# Patient Record
Sex: Male | Born: 1935
Health system: Southern US, Community
[De-identification: ages and names within clinical notes are randomized; demographics above are authoritative.]

## PROBLEM LIST (undated history)

## (undated) ENCOUNTER — Emergency Department (HOSPITAL_COMMUNITY): Admission: EM | Discharge status: Absent without leave | Payer: Medicare Other

## (undated) DIAGNOSIS — G629 Polyneuropathy, unspecified: Secondary | ICD-10-CM

## (undated) DIAGNOSIS — Z9889 Other specified postprocedural states: Secondary | ICD-10-CM

## (undated) DIAGNOSIS — R413 Other amnesia: Secondary | ICD-10-CM

## (undated) DIAGNOSIS — H543 Unqualified visual loss, both eyes: Secondary | ICD-10-CM

## (undated) DIAGNOSIS — F0390 Unspecified dementia without behavioral disturbance: Secondary | ICD-10-CM

## (undated) DIAGNOSIS — C61 Malignant neoplasm of prostate: Secondary | ICD-10-CM

## (undated) DIAGNOSIS — G4700 Insomnia, unspecified: Secondary | ICD-10-CM

## (undated) DIAGNOSIS — R51 Headache: Secondary | ICD-10-CM

## (undated) DIAGNOSIS — J45909 Unspecified asthma, uncomplicated: Secondary | ICD-10-CM

## (undated) DIAGNOSIS — I10 Essential (primary) hypertension: Secondary | ICD-10-CM

## (undated) DIAGNOSIS — M1990 Unspecified osteoarthritis, unspecified site: Secondary | ICD-10-CM

## (undated) DIAGNOSIS — E7800 Pure hypercholesterolemia, unspecified: Secondary | ICD-10-CM

## (undated) DIAGNOSIS — N200 Calculus of kidney: Secondary | ICD-10-CM

## (undated) DIAGNOSIS — R112 Nausea with vomiting, unspecified: Secondary | ICD-10-CM

## (undated) HISTORY — PX: KNEE ARTHROSCOPY: SHX127

## (undated) HISTORY — DX: Malignant neoplasm of prostate: C61

## (undated) HISTORY — PX: HEMORROIDECTOMY: SUR656

## (undated) HISTORY — PX: APPENDECTOMY: SHX54

## (undated) HISTORY — DX: Insomnia, unspecified: G47.00

## (undated) HISTORY — DX: Other amnesia: R41.3

## (undated) HISTORY — PX: TONSILLECTOMY: SUR1361

---

## 1999-06-12 ENCOUNTER — Ambulatory Visit (HOSPITAL_COMMUNITY): Admission: RE | Admit: 1999-06-12 | Discharge: 1999-06-12 | Payer: Self-pay | Admitting: Cardiology

## 1999-06-29 ENCOUNTER — Ambulatory Visit (HOSPITAL_COMMUNITY): Admission: RE | Admit: 1999-06-29 | Discharge: 1999-06-29 | Payer: Self-pay | Admitting: *Deleted

## 1999-07-04 ENCOUNTER — Ambulatory Visit (HOSPITAL_COMMUNITY): Admission: RE | Admit: 1999-07-04 | Discharge: 1999-07-04 | Payer: Self-pay | Admitting: *Deleted

## 2002-08-09 ENCOUNTER — Emergency Department (HOSPITAL_COMMUNITY): Admission: EM | Admit: 2002-08-09 | Discharge: 2002-08-10 | Payer: Self-pay | Admitting: Emergency Medicine

## 2002-08-10 ENCOUNTER — Ambulatory Visit (HOSPITAL_COMMUNITY): Admission: RE | Admit: 2002-08-10 | Discharge: 2002-08-10 | Payer: Self-pay | Admitting: *Deleted

## 2004-06-28 ENCOUNTER — Encounter: Admission: RE | Admit: 2004-06-28 | Discharge: 2004-06-28 | Payer: Self-pay | Admitting: Occupational Medicine

## 2004-08-16 ENCOUNTER — Encounter: Admission: RE | Admit: 2004-08-16 | Discharge: 2004-08-16 | Payer: Self-pay | Admitting: Internal Medicine

## 2006-02-21 ENCOUNTER — Encounter: Payer: Self-pay | Admitting: Vascular Surgery

## 2006-02-21 ENCOUNTER — Ambulatory Visit: Admission: RE | Admit: 2006-02-21 | Discharge: 2006-02-21 | Payer: Self-pay | Admitting: Internal Medicine

## 2006-06-06 ENCOUNTER — Ambulatory Visit (HOSPITAL_COMMUNITY): Admission: RE | Admit: 2006-06-06 | Discharge: 2006-06-06 | Payer: Self-pay | Admitting: Ophthalmology

## 2006-10-28 ENCOUNTER — Encounter: Admission: RE | Admit: 2006-10-28 | Discharge: 2006-10-28 | Payer: Self-pay | Admitting: Internal Medicine

## 2006-11-04 ENCOUNTER — Encounter: Admission: RE | Admit: 2006-11-04 | Discharge: 2006-11-04 | Payer: Self-pay | Admitting: Internal Medicine

## 2006-11-18 ENCOUNTER — Encounter: Admission: RE | Admit: 2006-11-18 | Discharge: 2006-11-18 | Payer: Self-pay | Admitting: *Deleted

## 2006-11-18 ENCOUNTER — Encounter (INDEPENDENT_AMBULATORY_CARE_PROVIDER_SITE_OTHER): Payer: Self-pay | Admitting: Specialist

## 2006-11-18 ENCOUNTER — Other Ambulatory Visit: Admission: RE | Admit: 2006-11-18 | Discharge: 2006-11-18 | Payer: Self-pay | Admitting: Interventional Radiology

## 2006-11-28 ENCOUNTER — Ambulatory Visit (HOSPITAL_COMMUNITY): Admission: RE | Admit: 2006-11-28 | Discharge: 2006-11-28 | Payer: Self-pay | Admitting: Ophthalmology

## 2007-04-29 ENCOUNTER — Encounter: Admission: RE | Admit: 2007-04-29 | Discharge: 2007-04-29 | Payer: Self-pay | Admitting: Internal Medicine

## 2008-03-20 ENCOUNTER — Emergency Department (HOSPITAL_COMMUNITY): Admission: EM | Admit: 2008-03-20 | Discharge: 2008-03-21 | Payer: Self-pay | Admitting: Emergency Medicine

## 2008-04-06 ENCOUNTER — Encounter: Admission: RE | Admit: 2008-04-06 | Discharge: 2008-04-06 | Payer: Self-pay | Admitting: Internal Medicine

## 2008-05-21 ENCOUNTER — Encounter: Admission: RE | Admit: 2008-05-21 | Discharge: 2008-05-21 | Payer: Self-pay | Admitting: Internal Medicine

## 2008-06-03 ENCOUNTER — Encounter: Admission: RE | Admit: 2008-06-03 | Discharge: 2008-06-03 | Payer: Self-pay | Admitting: Internal Medicine

## 2010-02-10 ENCOUNTER — Ambulatory Visit (HOSPITAL_COMMUNITY): Admission: RE | Admit: 2010-02-10 | Discharge: 2010-02-10 | Payer: Self-pay | Admitting: Urology

## 2010-02-22 ENCOUNTER — Ambulatory Visit: Admission: RE | Admit: 2010-02-22 | Discharge: 2010-04-13 | Payer: Self-pay | Admitting: Radiation Oncology

## 2010-05-03 ENCOUNTER — Ambulatory Visit: Admission: RE | Admit: 2010-05-03 | Discharge: 2010-08-01 | Payer: Self-pay | Admitting: Radiation Oncology

## 2010-08-01 ENCOUNTER — Ambulatory Visit: Admission: RE | Admit: 2010-08-01 | Discharge: 2010-08-31 | Payer: Self-pay | Admitting: Radiation Oncology

## 2011-03-01 ENCOUNTER — Ambulatory Visit (HOSPITAL_COMMUNITY)
Admission: RE | Admit: 2011-03-01 | Discharge: 2011-03-01 | Disposition: A | Discharge status: Home / Self Care | Payer: Medicare Other | Source: Ambulatory Visit | Attending: Ophthalmology | Admitting: Ophthalmology

## 2011-03-01 DIAGNOSIS — H2640 Unspecified secondary cataract: Secondary | ICD-10-CM | POA: Insufficient documentation

## 2011-03-02 NOTE — Op Note (Signed)
NAME:  Marvin Bishop, SZATKOWSKI NO.:  [id number]   MEDICAL RECORD NO.:  [id number]          PATIENT TYPE:  AMB   LOCATION:  SDS                          FACILITY:  [facility]   PHYSICIAN:  Salley Scarlet., M.D.DATE OF BIRTH:  12/13/1919   DATE OF PROCEDURE:  06/06/2006  DATE OF DISCHARGE:  06/06/2006                                 OPERATIVE REPORT   PREOPERATIVE DIAGNOSIS:  Immature cataract, left eye.   POSTOPERATIVE DIAGNOSIS:  Immature cataract, left eye.   OPERATION:  Kelman phacoemulsification of cataract, left eye.   ANESTHESIA:  Local using Xylocaine 2% with Marcaine 0.75% and Vitrase.   JUSTIFICATION FOR PROCEDURE:  This is an 77 year old gentleman who complains  of blurring of vision with difficulty seeing to read.  He was evaluated and  found to have bilateral immature cataracts, slightly worse on the left than  the right.  Cataract extraction with intraocular lens implantation was  recommended.  He is admitted at this time for that purpose.   PROCEDURE:  Under the influence of IV sedation, the Van Lint akinesia and  retrobulbar anesthesia were given.  The patient was prepped and draped in  the usual manner.  The lid speculum was inserted under the upper and lower  lids of the left eye and a 4-0 silk traction suture was passed through the  belly of the superior rectus muscle for retraction.  A fornix-based  conjunctival flap was turned and hemostasis was achieved by using cautery.  An incision was made in the sclera at the limbus.  This incision was  dissected down into clear cornea using a crescent blade.  A sideport  incision was made at the 1:30 o'clock position.  OcuCoat was injected into  the eye through the sideport incision.  The anterior chamber was then  entered through the corneoscleral groove incision at the 11:30 o'clock  position.  An anterior capsulotomy was done using a bent 25-gauge needle.  The nucleus was hydrodissected using Xylocaine.   The KPE handpiece was  passed into the eye and the nucleus was emulsified without difficulty.  The  residual cortical material was aspirated.  The posterior capsule was  polished using an olive-tip polisher.  The wound was widened slightly to  accommodate a foldable silicone lens.  The lens was seated into the eye  behind the iris without difficulty.  The anterior chamber was reformed and  the pupil was constricted using Miochol.  The residual OcuCoat was aspirated  from the eye.  The lips of the wound were hydrated and tested to make sure  that there was no leak.  After ascertaining that there was no leak, the  conjunctiva was closed over the wound using thermal cautery.  One milliliter  of Celestone and 0.5 mL of gentamicin were injected subconjunctivally.  Maxitrol ophthalmic ointment and Pilopine ointment were applied along with a  patch and Fox shield.  The patient tolerated the procedure well and was  discharged to the postanesthesia recovery room in satisfactory condition.   He is instructed to rest today, to take Vicodin every 4 hours as  needed for  pain and to see me in the office tomorrow for further evaluation.   DISCHARGE DIAGNOSIS:  Immature cataract of left eye.      Salley Scarlet., M.D.  Electronically Signed     TB/MEDQ  D:  06/06/2006  T:  06/07/2006  Job:  [id number]

## 2011-03-02 NOTE — Op Note (Signed)
NAME:  Marvin Bishop, Marvin Bishop NO.:  [id number]   MEDICAL RECORD NO.:  [id number]          PATIENT TYPE:  AMB   LOCATION:  SDS                          FACILITY:  [facility]   PHYSICIAN:  Salley Scarlet., M.D.DATE OF BIRTH:  January 25, 1920   DATE OF PROCEDURE:  11/30/2006  DATE OF DISCHARGE:  11/28/2006                               OPERATIVE REPORT   SURGEON:  Nadyne Coombes, M.D.   PREOPERATIVE DIAGNOSIS:  Immature cataract, right eye.   POSTOPERATIVE DIAGNOSIS:  Immature cataract, right eye.   OPERATION:  Kelman phakoemulsification of cataract, right eye, with  intraocular lens implantation.   ANESTHESIA:  Local using Xylocaine 2% with Marcaine 0.75% and Wydase.   JUSTIFICATION FOR PROCEDURE:  This is an 77 year old gentleman, who  underwent a cataract extraction from the left eye several months ago.  He complains of blurring of vision with difficulty seeing to read.  He  was evaluated and found to have a visual acuity best corrected to 20/60  on the right and 20/30 on the left.  There is a intraocular lens present  on the left, but there is a 2+ nuclear sclerotic cataract on the right.  Cataract extraction with intraocular lens implantation was recommended.  He was admitted at this time for that purpose.   PROCEDURE:  Under the influence of IV sedation, Van Lint akinesia and  retrobulbar anesthesia were given.  The patient was prepped and draped  in the usual manner. The lid speculum was inserted under the upper and  lower lids of the right eye, and a 4-0 silk traction suture was passed  through the belly of the superior rectus muscle for traction.  A fornix-  based conjunctival flap was turned, and hemostasis was achieved using  cautery.  An incision was made in the sclera at the limbus.  This  incision was dissected down into clear cornea using a crescent blade.  A  side-port incision was made at the 1:30-o'clock position.  Ocucoat was  injected into the eye  through the side-port incision.  The anterior  chamber was then entered through the corneoscleral tunnel incision at  the 11:30-o'clock position using a 2.75-mm keratome.  An anterior  capsulotomy was done using a bent 25-gauge needle.  The nucleus was  hydrodissected using Xylocaine.  The KPE handpiece was passed into the  eye and the nucleus was emulsified with a moderate amount of difficulty.  The residual cortical material was aspirated from the eye.  The wound  was widened slightly to accommodate a foldable Silicone lens.  This lens  was seated into the eye behind the iris, but as it was inserted, it was  noticed to begin to sink into the posterior chamber.  The corneoscleral  wound was then opened, and the IOL was grasped securely with the lens  forceps and gently removed from the eye.  An anterior vitrectomy was  done using the vitrector.  An anterior chamber lens was then seated  across the iris into the chamber angle, and rotated in a horizontal  fashion.  A peripheral iridectomy was made in the  iris.  The  corneoscleral wound was closed by using a combination of interrupted  sutures of 8-0 Vicryl and 10 nylon.  After it was ascertained that the  wound was airtight and watertight, the conjunctiva was closed over the  wound using thermal cautery.  Celestone 1 cc and 0.5 cc of gentamicin  were injected subconjunctivally.  Maxitrol ophthalmic ointment and  atropine drops were applied, along with a patch and Fox shield.  The  patient tolerated the procedure well and was discharged to the  postanesthesia recovery room in satisfactory condition.  He was  instructed to rest today, to take Vicodin every 4 hours as needed for  pain, and to see me in the office tomorrow for further evaluation.   DISCHARGE DIAGNOSIS:  Immature cataract, right eye.      Salley Scarlet., M.D.  Electronically Signed     TB/MEDQ  D:  11/30/2006  T:  11/30/2006  Job:  [id number]

## 2011-07-12 LAB — URINALYSIS, ROUTINE W REFLEX MICROSCOPIC
Bilirubin Urine: NEGATIVE
Glucose, UA: NEGATIVE
Hgb urine dipstick: NEGATIVE
Ketones, ur: NEGATIVE
Nitrite: NEGATIVE
Protein, ur: NEGATIVE
Specific Gravity, Urine: 1.018
Urobilinogen, UA: 1
pH: 7.5

## 2011-07-12 LAB — POCT I-STAT, CHEM 8
BUN: 15
Calcium, Ion: 1.3
Chloride: 104
Creatinine, Ser: 1
Glucose, Bld: 102 — ABNORMAL HIGH
HCT: 29 — ABNORMAL LOW
Hemoglobin: 9.9 — ABNORMAL LOW
Potassium: 4.6
Sodium: 138
TCO2: 25

## 2011-07-12 LAB — CBC
HCT: 28.6 — ABNORMAL LOW
Hemoglobin: 9.7 — ABNORMAL LOW
MCHC: 33.8
MCV: 82.7
Platelets: 262
RBC: 3.46 — ABNORMAL LOW
RDW: 16.3 — ABNORMAL HIGH
WBC: 6.7

## 2011-07-12 LAB — DIFFERENTIAL
Basophils Absolute: 0
Basophils Relative: 0
Eosinophils Absolute: 0.1
Eosinophils Relative: 1
Lymphocytes Relative: 15
Lymphs Abs: 1
Monocytes Absolute: 0.5
Monocytes Relative: 8
Neutro Abs: 5.1
Neutrophils Relative %: 76

## 2011-07-12 LAB — POCT CARDIAC MARKERS
CKMB, poc: 1.9
Myoglobin, poc: 74.2
Operator id: 264421
Troponin i, poc: <0.05

## 2011-09-16 ENCOUNTER — Emergency Department (HOSPITAL_COMMUNITY)
Admission: EM | Admit: 2011-09-16 | Discharge: 2011-09-16 | Discharge status: Against Medical Advice | Payer: Medicare Other | Attending: Emergency Medicine | Admitting: Emergency Medicine

## 2011-09-16 DIAGNOSIS — M549 Dorsalgia, unspecified: Secondary | ICD-10-CM | POA: Insufficient documentation

## 2011-09-16 HISTORY — DX: Essential (primary) hypertension: I10

## 2011-09-16 HISTORY — DX: Pure hypercholesterolemia, unspecified: E78.00

## 2011-09-16 HISTORY — DX: Unspecified osteoarthritis, unspecified site: M19.90

## 2011-09-16 NOTE — ED Notes (Signed)
Pt in lower back pain denies recent injury states hx of arthritis states pain in the left hip states took pain medication this am no relief as of yet

## 2011-09-19 ENCOUNTER — Emergency Department (HOSPITAL_COMMUNITY)
Admission: EM | Admit: 2011-09-19 | Discharge: 2011-09-19 | Discharge status: Against Medical Advice | Payer: Medicare Other | Attending: Emergency Medicine | Admitting: Emergency Medicine

## 2011-09-19 ENCOUNTER — Other Ambulatory Visit: Payer: Self-pay | Admitting: Internal Medicine

## 2011-09-19 DIAGNOSIS — M549 Dorsalgia, unspecified: Secondary | ICD-10-CM | POA: Insufficient documentation

## 2011-09-19 DIAGNOSIS — M5410 Radiculopathy, site unspecified: Secondary | ICD-10-CM

## 2011-09-19 DIAGNOSIS — M545 Low back pain: Secondary | ICD-10-CM

## 2011-09-19 DIAGNOSIS — M5380 Other specified dorsopathies, site unspecified: Secondary | ICD-10-CM | POA: Insufficient documentation

## 2011-09-19 NOTE — ED Notes (Signed)
[facility]:[id number]<GE> Expected date:09/19/11<BR> Expected time: 5:26 PM<BR> Means of arrival:Ambulance<BR> Comments:<BR> EMS 31 GC - back spasms

## 2011-09-19 NOTE — ED Notes (Signed)
Pt has had back pain since October and was supposed to have mri on saturday

## 2011-09-22 ENCOUNTER — Ambulatory Visit
Admission: RE | Admit: 2011-09-22 | Discharge: 2011-09-22 | Disposition: A | Discharge status: Home / Self Care | Payer: Medicare Other | Source: Ambulatory Visit | Attending: Internal Medicine | Admitting: Internal Medicine

## 2011-09-22 DIAGNOSIS — M545 Low back pain: Secondary | ICD-10-CM

## 2011-09-22 DIAGNOSIS — M5410 Radiculopathy, site unspecified: Secondary | ICD-10-CM

## 2011-10-23 ENCOUNTER — Encounter (HOSPITAL_COMMUNITY): Payer: Self-pay | Admitting: Pharmacy Technician

## 2011-10-23 ENCOUNTER — Encounter (HOSPITAL_COMMUNITY): Payer: Self-pay | Admitting: *Deleted

## 2011-10-23 NOTE — H&P (Signed)
Pre-operative History and Physical for Ophthalmic Surgery  Marvin Bishop 10/23/2011                  Chief Complaint: decreased vision right eye  Diagnosis: Rhegmatogenous Retinal Detachment Right Eye  No Known Allergies   Prior to Admission medications   Medication Sig Start Date End Date Taking? Authorizing Provider  cyclobenzaprine (FLEXERIL) 10 MG tablet Take 10 mg by mouth 3 (three) times daily as needed.     Historical Provider, MD  dorzolamide (TRUSOPT) 2 % ophthalmic solution Place 1 drop into the right eye 3 (three) times daily.      Historical Provider, MD  glipiZIDE (GLUCOTROL) 10 MG tablet Take 10 mg by mouth 2 (two) times daily before a meal.      Historical Provider, MD  HYDROcodone-acetaminophen (VICODIN) 5-500 MG per tablet Take 1 tablet by mouth every 8 (eight) hours as needed. AS NEEDED FOR PAIN    Historical Provider, MD  lisinopril (PRINIVIL,ZESTRIL) 2.5 MG tablet Take 2.5 mg by mouth daily.      Historical Provider, MD  LORazepam (ATIVAN) 0.5 MG tablet Take 0.25 mg by mouth every 8 (eight) hours. TAKE 1/2 A TABLET BY MOUTH TWICE DAILY    Historical Provider, MD  meloxicam (MOBIC) 15 MG tablet Take 15 mg by mouth daily.     Historical Provider, MD  metFORMIN (GLUCOPHAGE) 500 MG tablet Take 500 mg by mouth 2 (two) times daily with a meal.      Historical Provider, MD  pioglitazone (ACTOS) 45 MG tablet Take 45 mg by mouth daily.      Historical Provider, MD  prednisoLONE acetate (PRED FORTE) 1 % ophthalmic suspension Place 1 drop into the right eye 4 (four) times daily.      Historical Provider, MD  simvastatin (ZOCOR) 40 MG tablet Take 40 mg by mouth daily.      Historical Provider, MD  Tamsulosin HCl (FLOMAX) 0.4 MG CAPS Take by mouth daily. TAKE 30 MINS. DAILY AFTER SAME MEAL     Historical Provider, MD    Planned Procedure: Pars Plana Vitrectomy, Membrane Peeling, Endolaser, Fluid Gas Exchange and Scleral Buckle          There were no vitals filed for this  visit.  Pulse: 70          Resp: 18        Past Medical History  Diagnosis Date  . Arthritis   . Diabetes mellitus   . Hypertension   . Hypercholesteremia     Past Surgical History  Procedure Date  . Tonsillectomy   . Joint replacement   . Appendectomy      History   Social History  . Marital Status: Widowed    Spouse Name: N/A    Number of Children: N/A  . Years of Education: N/A   Occupational History  . Not on file.   Social History Main Topics  . Smoking status: Former Games developer  . Smokeless tobacco: Not on file  . Alcohol Use: Yes     occasional  . Drug Use: No  . Sexually Active:    Other Topics Concern  . Not on file   Social History Narrative  . No narrative on file     The following examination is for anesthesia clearance for minimally invasive Ophthalmic surgery. It is primarily to document heart and lung findings and is not intended to elucidate unknown general medical conditions inclusive of abdominal masses, lung lesions, etc.  General Constitution:  within normal limits    Alertness/Orientation:  Person, time place     yes   HEENT:  Eye Findings: retinal detachment, vitreous cells, macula off                   right eye  Neck: supple without masses  Chest/Lungs: clear to auscultation  Cardiac: Normal S1 and S2 without Murmur, S3 or S4  Neuro: non-focal  Impression: Rhegmatogenous Retinal Detachment   Planned Procedure:   Pars Plana Vitrectomy, Fluid Gas Exchange, Endolaser, possible scleral buckle    Shade Flood, MD Vitreoretinal Surgery/Medical Retina

## 2011-10-24 ENCOUNTER — Encounter (HOSPITAL_COMMUNITY): Payer: Self-pay | Admitting: Anesthesiology

## 2011-10-24 ENCOUNTER — Other Ambulatory Visit: Payer: Self-pay

## 2011-10-24 ENCOUNTER — Observation Stay (HOSPITAL_COMMUNITY)
Admission: RE | Admit: 2011-10-24 | Discharge: 2011-10-25 | Disposition: A | Discharge status: Home / Self Care | Payer: Medicare Other | Source: Ambulatory Visit | Attending: Ophthalmology | Admitting: Ophthalmology

## 2011-10-24 ENCOUNTER — Ambulatory Visit (HOSPITAL_COMMUNITY): Payer: Medicare Other | Admitting: Anesthesiology

## 2011-10-24 ENCOUNTER — Ambulatory Visit (HOSPITAL_COMMUNITY): Payer: Medicare Other

## 2011-10-24 ENCOUNTER — Encounter (HOSPITAL_COMMUNITY)
Admission: RE | Disposition: A | Discharge status: Home / Self Care | Payer: Self-pay | Source: Ambulatory Visit | Attending: Ophthalmology

## 2011-10-24 DIAGNOSIS — H35349 Macular cyst, hole, or pseudohole, unspecified eye: Secondary | ICD-10-CM | POA: Insufficient documentation

## 2011-10-24 DIAGNOSIS — H3320 Serous retinal detachment, unspecified eye: Principal | ICD-10-CM | POA: Insufficient documentation

## 2011-10-24 DIAGNOSIS — I10 Essential (primary) hypertension: Secondary | ICD-10-CM | POA: Insufficient documentation

## 2011-10-24 DIAGNOSIS — E119 Type 2 diabetes mellitus without complications: Secondary | ICD-10-CM | POA: Insufficient documentation

## 2011-10-24 DIAGNOSIS — Z87891 Personal history of nicotine dependence: Secondary | ICD-10-CM | POA: Insufficient documentation

## 2011-10-24 HISTORY — PX: PARS PLANA VITRECTOMY: SHX2166

## 2011-10-24 LAB — BASIC METABOLIC PANEL
CO2: 27 mEq/L (ref 19–32)
Chloride: 108 mEq/L (ref 96–112)
Creatinine, Ser: 1.06 mg/dL (ref 0.50–1.35)
GFR calc Af Amer: 70 mL/min — ABNORMAL LOW (ref >90)
Potassium: 4.1 mEq/L (ref 3.5–5.1)
Sodium: 142 mEq/L (ref 135–145)

## 2011-10-24 LAB — GLUCOSE, CAPILLARY
Glucose-Capillary: 125 mg/dL — ABNORMAL HIGH (ref 70–99)
Glucose-Capillary: 86 mg/dL (ref 70–99)

## 2011-10-24 LAB — CBC
HCT: 32 % — ABNORMAL LOW (ref 39.0–52.0)
MCV: 82.3 fL (ref 78.0–100.0)
RBC: 3.89 MIL/uL — ABNORMAL LOW (ref 4.22–5.81)
RDW: 15.8 % — ABNORMAL HIGH (ref 11.5–15.5)
WBC: 5 10*3/uL (ref 4.0–10.5)

## 2011-10-24 LAB — SURGICAL PCR SCREEN: Staphylococcus aureus: POSITIVE — AB

## 2011-10-24 SURGERY — PARS PLANA VITRECTOMY WITH 23 GAUGE
Anesthesia: General | Site: Eye | Laterality: Right | Wound class: Clean

## 2011-10-24 MED ORDER — PREDNISOLONE ACETATE 1 % OP SUSP
OPHTHALMIC | Status: AC
  Administered 2011-10-24: 1 [drp]
  Filled 2011-10-24: qty 5

## 2011-10-24 MED ORDER — GATIFLOXACIN 0.5 % OP SOLN
1.0000 [drp] | OPHTHALMIC | Status: AC | PRN
  Administered 2011-10-24 (×3): 1 [drp] via OPHTHALMIC

## 2011-10-24 MED ORDER — HYPROMELLOSE (GONIOSCOPIC) 2.5 % OP SOLN
OPHTHALMIC | Status: DC | PRN
  Administered 2011-10-24: 2 [drp] via OPHTHALMIC

## 2011-10-24 MED ORDER — BSS PLUS IO SOLN
INTRAOCULAR | Status: DC | PRN
  Administered 2011-10-24: 1 via INTRAOCULAR

## 2011-10-24 MED ORDER — DROPERIDOL 2.5 MG/ML IJ SOLN
0.6250 mg | INTRAMUSCULAR | Status: DC | PRN
  Filled 2011-10-24: qty 0.25

## 2011-10-24 MED ORDER — MUPIROCIN 2 % EX OINT
TOPICAL_OINTMENT | Freq: Two times a day (BID) | CUTANEOUS | Status: DC
  Administered 2011-10-24 – 2011-10-25 (×2): via NASAL
  Filled 2011-10-24: qty 22

## 2011-10-24 MED ORDER — BSS IO SOLN
INTRAOCULAR | Status: DC | PRN
  Administered 2011-10-24: 500 mL via INTRAOCULAR

## 2011-10-24 MED ORDER — PHENYLEPHRINE HCL 2.5 % OP SOLN
OPHTHALMIC | Status: AC
  Administered 2011-10-24: 1 [drp]
  Filled 2011-10-24: qty 3

## 2011-10-24 MED ORDER — HYDROMORPHONE HCL PF 1 MG/ML IJ SOLN: 0.2500 mg | INTRAMUSCULAR | Status: DC | PRN

## 2011-10-24 MED ORDER — PHENYLEPHRINE HCL 2.5 % OP SOLN
1.0000 [drp] | OPHTHALMIC | Status: AC | PRN
  Administered 2011-10-24 (×3): 1 [drp] via OPHTHALMIC

## 2011-10-24 MED ORDER — DROPERIDOL 2.5 MG/ML IJ SOLN: 0.6250 mg | INTRAMUSCULAR | Status: DC | PRN

## 2011-10-24 MED ORDER — ACETAZOLAMIDE SODIUM 500 MG IJ SOLR
INTRAMUSCULAR | Status: DC | PRN
  Administered 2011-10-24: 500 mg via INTRAVENOUS

## 2011-10-24 MED ORDER — SODIUM CHLORIDE 0.9 % IV SOLN
INTRAVENOUS | Status: DC
  Administered 2011-10-24: 15:00:00 via INTRAVENOUS

## 2011-10-24 MED ORDER — BUPIVACAINE HCL 0.75 % IJ SOLN
INTRAMUSCULAR | Status: DC | PRN
  Administered 2011-10-24: 4 mL

## 2011-10-24 MED ORDER — TRAMADOL HCL 50 MG PO TABS
50.0000 mg | ORAL_TABLET | ORAL | Status: DC | PRN
  Filled 2011-10-24: qty 1

## 2011-10-24 MED ORDER — TRIAMCINOLONE ACETONIDE 40 MG/ML IJ SUSP
INTRAMUSCULAR | Status: DC | PRN
  Administered 2011-10-24: 1 mL

## 2011-10-24 MED ORDER — HOMATROPINE HBR 2 % OP SOLN
1.0000 [drp] | OPHTHALMIC | Status: AC
  Administered 2011-10-24 (×3): 1 [drp] via OPHTHALMIC
  Filled 2011-10-24: qty 5

## 2011-10-24 MED ORDER — EPINEPHRINE HCL 1 MG/ML IJ SOLN
INTRAMUSCULAR | Status: DC | PRN
  Administered 2011-10-24: .3 mL

## 2011-10-24 MED ORDER — SODIUM CHLORIDE 0.9 % IV SOLN: INTRAVENOUS | Status: DC

## 2011-10-24 MED ORDER — CEFAZOLIN SUBCONJUNCTIVAL INJECTION 100 MG/0.5 ML
200.0000 mg | INJECTION | SUBCONJUNCTIVAL | Status: AC
  Administered 2011-10-24: 200 mg via SUBCONJUNCTIVAL
  Filled 2011-10-24 (×2): qty 1

## 2011-10-24 MED ORDER — EPHEDRINE SULFATE 50 MG/ML IJ SOLN
INTRAMUSCULAR | Status: DC | PRN
  Administered 2011-10-24 (×2): 5 mg via INTRAVENOUS

## 2011-10-24 MED ORDER — PROMETHAZINE HCL 25 MG/ML IJ SOLN: 12.5000 mg | INTRAMUSCULAR | Status: DC | PRN

## 2011-10-24 MED ORDER — SODIUM CHLORIDE 0.9 % IV SOLN
INTRAVENOUS | Status: DC | PRN
  Administered 2011-10-24 (×2): via INTRAVENOUS

## 2011-10-24 MED ORDER — MIDAZOLAM HCL 5 MG/5ML IJ SOLN
INTRAMUSCULAR | Status: DC | PRN
  Administered 2011-10-24: 1 mg via INTRAVENOUS
  Administered 2011-10-24: 0.5 mg via INTRAVENOUS

## 2011-10-24 MED ORDER — ONDANSETRON HCL 4 MG/2ML IJ SOLN
INTRAMUSCULAR | Status: DC | PRN
  Administered 2011-10-24: 4 mg via INTRAVENOUS

## 2011-10-24 MED ORDER — TETRACAINE HCL 0.5 % OP SOLN
OPHTHALMIC | Status: AC
  Filled 2011-10-24: qty 2

## 2011-10-24 MED ORDER — GATIFLOXACIN 0.5 % OP SOLN
OPHTHALMIC | Status: AC
  Filled 2011-10-24: qty 2.5

## 2011-10-24 MED ORDER — PREDNISOLONE ACETATE 1 % OP SUSP: 1.0000 [drp] | OPHTHALMIC | Status: DC

## 2011-10-24 MED ORDER — FENTANYL CITRATE 0.05 MG/ML IJ SOLN
INTRAMUSCULAR | Status: DC | PRN
  Administered 2011-10-24 (×2): 25 ug via INTRAVENOUS
  Administered 2011-10-24: 100 ug via INTRAVENOUS
  Administered 2011-10-24 (×4): 25 ug via INTRAVENOUS

## 2011-10-24 MED ORDER — DEXAMETHASONE SODIUM PHOSPHATE 10 MG/ML IJ SOLN
INTRAMUSCULAR | Status: DC | PRN
  Administered 2011-10-24: 1 mL

## 2011-10-24 MED ORDER — PHENYLEPHRINE HCL 10 MG/ML IJ SOLN
INTRAMUSCULAR | Status: DC | PRN
  Administered 2011-10-24 (×3): 40 ug via INTRAVENOUS
  Administered 2011-10-24: 80 ug via INTRAVENOUS
  Administered 2011-10-24 (×3): 40 ug via INTRAVENOUS
  Administered 2011-10-24: 80 ug via INTRAVENOUS

## 2011-10-24 MED ORDER — PROPOFOL 10 MG/ML IV EMUL
INTRAVENOUS | Status: DC | PRN
  Administered 2011-10-24: 200 mg via INTRAVENOUS

## 2011-10-24 MED ORDER — TETRACAINE HCL 0.5 % OP SOLN
2.0000 [drp] | OPHTHALMIC | Status: AC
  Administered 2011-10-24: 2 [drp] via OPHTHALMIC

## 2011-10-24 SURGICAL SUPPLY — 68 items
APPLICATOR COTTON TIP 6IN STRL (MISCELLANEOUS) ×2 IMPLANT
APPLICATOR DR MATTHEWS STRL (MISCELLANEOUS) IMPLANT
BLADE EYE MINI 60D BEAVER (BLADE) ×2 IMPLANT
BLADE KERATOME 2.75 (BLADE) IMPLANT
BLADE MVR KNIFE 19G (BLADE) ×2 IMPLANT
BLADE STAB KNIFE 15DEG (BLADE) ×2 IMPLANT
CANNULA DUAL BORE 23G (CANNULA) ×4 IMPLANT
CANNULA FLEX TIP 23G (CANNULA) IMPLANT
CANNULA INFUSION 4 (MISCELLANEOUS) ×2 IMPLANT
CLOTH BEACON ORANGE TIMEOUT ST (SAFETY) ×2 IMPLANT
CORDS BIPOLAR (ELECTRODE) ×4 IMPLANT
DRAPE OPHTHALMIC 77X100 STRL (CUSTOM PROCEDURE TRAY) ×2 IMPLANT
DRAPE POUCH INSTRU U-SHP 10X18 (DRAPES) ×2 IMPLANT
DRSG TEGADERM 4X4.75 (GAUZE/BANDAGES/DRESSINGS) ×2 IMPLANT
EYE SHIELD UNIVERSAL CLEAR (GAUZE/BANDAGES/DRESSINGS) ×2 IMPLANT
FILTER BLUE MILLIPORE (MISCELLANEOUS) IMPLANT
GAS OPHTHALMIC (MISCELLANEOUS) IMPLANT
GLOVE SS BIOGEL STRL SZ 6.5 (GLOVE) ×2 IMPLANT
GLOVE SUPERSENSE BIOGEL SZ 6.5 (GLOVE) ×2
GOWN STRL NON-REIN LRG LVL3 (GOWN DISPOSABLE) ×6 IMPLANT
ILLUMINATOR WIDEFIELD DIFF (MISCELLANEOUS) ×2 IMPLANT
KIT PERFLUORON PROCEDURE 5ML (MISCELLANEOUS) ×2 IMPLANT
KIT ROOM TURNOVER OR (KITS) ×2 IMPLANT
KNIFE CRESCENT 1.75 EDGEAHEAD (BLADE) ×2 IMPLANT
KNIFE GRIESHABER SHARP 2.5MM (MISCELLANEOUS) ×2 IMPLANT
MARKER SKIN DUAL TIP RULER LAB (MISCELLANEOUS) ×2 IMPLANT
NEEDLE 18GX1X1/2 (RX/OR ONLY) (NEEDLE) ×2 IMPLANT
NEEDLE 22X1 1/2 (OR ONLY) (NEEDLE) ×6 IMPLANT
NEEDLE 25GX 5/8IN NON SAFETY (NEEDLE) ×4 IMPLANT
NEEDLE HYPO 30X.5 LL (NEEDLE) ×4 IMPLANT
NS IRRIG 1000ML POUR BTL (IV SOLUTION) ×2 IMPLANT
OIL SILICONE OPHTHALMIC ADAPTO (Ophthalmic Related) ×2 IMPLANT
PACK COMBINED CATERACT/VIT 23G (OPHTHALMIC RELATED) ×2 IMPLANT
PACK VITRECTOMY CUSTOM (CUSTOM PROCEDURE TRAY) ×2 IMPLANT
PACK VITRECTOMY PIC MCHSVP (PACKS) IMPLANT
PAD ARMBOARD 7.5X6 YLW CONV (MISCELLANEOUS) ×4 IMPLANT
PAD EYE OVAL STERILE LF (GAUZE/BANDAGES/DRESSINGS) ×2 IMPLANT
PAK VITRECTOMY PIK  23GA (OPHTHALMIC RELATED) ×2 IMPLANT
PENCIL BIPOLAR 25GA STR DISP (OPHTHALMIC RELATED) ×2 IMPLANT
PHACO TIP KELMAN 45DEG (TIP) IMPLANT
PROBE DIRECTIONAL LASER (MISCELLANEOUS) ×2 IMPLANT
ROLLS DENTAL (MISCELLANEOUS) ×4 IMPLANT
SCRAPER DIAMOND DUST MEMBRANE (MISCELLANEOUS) IMPLANT
SET FLUID INJECTOR (SET/KITS/TRAYS/PACK) ×2 IMPLANT
SET VGFI TUBING 8065808002 (SET/KITS/TRAYS/PACK) IMPLANT
SHUTTLE MONARCH TYPE A (NEEDLE) IMPLANT
SOLUTION ANTI FOG 6CC (MISCELLANEOUS) ×2 IMPLANT
SPEAR EYE SURG WECK-CEL (MISCELLANEOUS) ×6 IMPLANT
SUT ETHILON 10 0 CS140 6 (SUTURE) IMPLANT
SUT ETHILON 4 0 P 3 18 (SUTURE) IMPLANT
SUT ETHILON 5 0 P 3 18 (SUTURE)
SUT ETHILON 9 0 TG140 8 (SUTURE) IMPLANT
SUT NYLON 10.0 BLK (SUTURE) IMPLANT
SUT NYLON ETHILON 5-0 P-3 1X18 (SUTURE) IMPLANT
SUT PLAIN 6 0 TG1408 (SUTURE) IMPLANT
SUT POLY NON ABSORB 10-0 8 STR (SUTURE) IMPLANT
SUT VICRYL 7 0 TG140 8 (SUTURE) ×2 IMPLANT
SUT VICRYL ABS 6-0 S29 18IN (SUTURE) IMPLANT
SYR 20CC LL (SYRINGE) ×2 IMPLANT
SYR 50ML LL SCALE MARK (SYRINGE) IMPLANT
SYR 5ML LL (SYRINGE) ×2 IMPLANT
SYR TB 1ML LUER SLIP (SYRINGE) ×2 IMPLANT
SYRINGE 10CC LL (SYRINGE) ×2 IMPLANT
TAPE PAPER MEDFIX 1IN X 10YD (GAUZE/BANDAGES/DRESSINGS) ×2 IMPLANT
TOWEL OR 17X24 6PK STRL BLUE (TOWEL DISPOSABLE) ×4 IMPLANT
TUBE EXTENSION HAMMER (TUBING) IMPLANT
WATER STERILE IRR 1000ML POUR (IV SOLUTION) ×2 IMPLANT
WIPE INSTRUMENT VISIWIPE 73X73 (MISCELLANEOUS) IMPLANT

## 2011-10-24 NOTE — Anesthesia Preprocedure Evaluation (Addendum)
Anesthesia Evaluation  Patient identified by MRN, date of birth, ID band Patient awake    Reviewed: Allergy & Precautions, H&P , NPO status , Patient's Chart, lab work & pertinent test results, reviewed documented beta blocker date and time   History of Anesthesia Complications Negative for: history of anesthetic complications  Airway       Dental   Pulmonary former smoker clear to auscultation  Pulmonary exam normal       Cardiovascular hypertension, Pt. on medications Regular Normal+ Systolic murmurs    Neuro/Psych Negative Neurological ROS     GI/Hepatic negative GI ROS, Neg liver ROS,   Endo/Other  Diabetes mellitus-, Oral Hypoglycemic Agents  Renal/GU negative Renal ROS     Musculoskeletal   Abdominal   Peds  Hematology   Anesthesia Other Findings   Reproductive/Obstetrics                         Anesthesia Physical Anesthesia Plan  ASA: III  Anesthesia Plan: General   Post-op Pain Management:    Induction: Intravenous  Airway Management Planned: Oral ETT  Additional Equipment:   Intra-op Plan:   Post-operative Plan: Extubation in OR  Informed Consent: I have reviewed the patients History and Physical, chart, labs and discussed the procedure including the risks, benefits and alternatives for the proposed anesthesia with the patient or authorized representative who has indicated his/her understanding and acceptance.     Plan Discussed with: Surgeon and CRNA  Anesthesia Plan Comments:         Anesthesia Quick Evaluation

## 2011-10-24 NOTE — Preoperative (Signed)
Beta Blockers   Reason not to administer Beta Blockers:Not Applicable, not taking Beta Blocker

## 2011-10-24 NOTE — Transfer of Care (Signed)
Immediate Anesthesia Transfer of Care Note  Patient: Marvin Bishop  Procedure(s) Performed:  PARS PLANA VITRECTOMY WITH 23 GAUGE  Patient Location: PACU  Anesthesia Type: General  Level of Consciousness: awake, oriented and patient cooperative  Airway & Oxygen Therapy: Patient Spontanous Breathing and Patient connected to nasal cannula oxygen  Post-op Assessment: Report given to PACU RN and Post -op Vital signs reviewed and stable  Post vital signs: Reviewed  Complications: No apparent anesthesia complications

## 2011-10-24 NOTE — Interval H&P Note (Signed)
History and Physical Interval Note:  10/24/2011 3:55 PM  Marvin Bishop  has presented today for surgery, with the diagnosis of retinal detachment right eye  The various methods of treatment have been discussed with the patient and family. After consideration of risks, benefits and other options for treatment, the patient has consented to  Procedure(s): PARS PLANA VITRECTOMY WITH 23 GAUGE as a surgical intervention .  The patients' history has been reviewed, patient examined, no change in status, stable for surgery.  I have reviewed the patients' chart and labs.  Questions were answered to the patient's satisfaction.     Shade Flood,  MD

## 2011-10-24 NOTE — Anesthesia Postprocedure Evaluation (Signed)
  Anesthesia Post-op Note  Patient: Marvin Bishop  Procedure(s) Performed:  PARS PLANA VITRECTOMY WITH 23 GAUGE  Patient Location: PACU  Anesthesia Type: General  Level of Consciousness: awake and alert   Airway and Oxygen Therapy: Patient Spontanous Breathing and Patient connected to nasal cannula oxygen  Post-op Pain: mild  Post-op Assessment: Post-op Vital signs reviewed, Patient's Cardiovascular Status Stable, Respiratory Function Stable, Patent Airway, No signs of Nausea or vomiting and Pain level controlled  Post-op Vital Signs: Reviewed and stable  Complications: No apparent anesthesia complications

## 2011-10-24 NOTE — Op Note (Signed)
JAMIRE SHABAZZ 10/24/2011 Diagnosis: Rhegmatogenous Retinal Detachment OD Post-operative Diagnosis:  Same and  Macular Hole as the cause Procedure: Pars Plana Vitrectomy, Endolaser, Fluid Gas Exchange and Silicone Oil Operative Eye:  right eye  Surgeon: Shade Flood Estimated Blood Loss: minimal Specimens for Pathology:  None Complications: none   The  patient was prepped and draped in the usual fashion for ocular surgery on the  right eye .  A solid lid speculum was placed. The conjunctiva was displaced with a cotton tipped applicator at the  7:30  meridian. A trocar/cannula was placed 3.5 mm from the surgical limbus. The cannula was visualized in the vitreous cavity. The infusion line was allowed to run and then clamped when placed at the cannula opening. The line was inserted and secured to the drape with an adhesive strip. Trocar/Cannulas were then placed at the 9:30 and 2:30 meridian. The light pipe and vitreous cutter were inserted into the vitreous cavity and the wide field lens was placed.   An opening was created in the detached posterior vitreous face. The detachment involved the periphery for 360 degrees. The vitreous had pigmented cellular debri. The foveal center was noted to have a small macular full thickness hole. Once there was an opening in the vitreous face posteriorly Perfluoron liquid was used to displace the sub-retinal fluid anteriorly. No peripheral breaks were identified with the wide field viewing lenses with a view up to the ora serratta. It was my impression that the [facility] was the cause of the detachment. A purposeful retinotomy was made at the 2:30 meridian anterior to the equator. A fenestrated sub-retinal cannula was then used to drain sub-retinal fluid in the nasal sub-retinal space with air-fluid exchange. Once the retina was re-attached the Perfluoron liquid was removed with care taken to insure there was no gross remnant at the macula. Endolaser was applied  around the drainage retinotomy. Given the presence of pigmented cells,  I elected to use silicone oil tamponade as I did not want a short duration tamponade given that I was not able to do an ILM peel with a freely mobile, bullous Macula off retinal detachment. Once stable, if needed I can perform the membrane peel at the time of removing the silicone oil.   The 23g Silicone injection cannula was utilized to place the silicone oil and a 26 g needle was used to vent residual air from the temporal cannula. Once the vitreous cavity was filled with silicone oil, the cannulas at 9:20 and 2:30 were removed and sutured with 7-0 vicryl suture. The occular pressure was palpably less than .  The remaining infusion cannula was removed with concomitant closure with a cotton tipped applicator and sewn closed with a 7-0 vicryl suture.  Subconjunctival injections of Ancef 100mg /0.12ml and Dexamethasone 4mg /42ml were placed in the infero-medial quadrant to avoid proximity to the cannula sites. An additional injection of 40mg  Kenalog was done in the inferior fornix as a subtenons injection to minimize inflammation and progression ton proliferative vitreoretinopathy.  The infusion cannula was removed with concomitant tamponade with the cotton tipped applicator leaving the ocular pressure less than 20 by palpation.  The speculum and drapes were removed and the eye was patched with Polymixin/Bacitracin ophthalmic ointment. An eye shield was placed and the patient was transferred alert and conversant with stable vital signs to the post operative recovery area.  Shade Flood MD

## 2011-10-24 NOTE — Progress Notes (Signed)
Spoke with dr Krista Blue re: pt having orange juice at 1100.  Stated cases running late so this should be OK. EKG shown to [employer] PA and OK'd.

## 2011-10-24 NOTE — Progress Notes (Addendum)
Report was called to short stay, pt vitals were stable, pt denied pain, was able to stand and walk to w/c with sba, pt denied any dizziness, post op note completed, pt had no signs/symptoms of pain or nausea upon leaving floor, pt alert and oriented X4, reports he is just very sleepy and wants to rest.  Pt accompanied by this RN to short stay, pt able to stand up and walk to chair again with only sba, pt still denies pain and nausea at that time.  Rec'd call from short stay, stating patients daughter requesting to have patient stay overnight and order was rec'd from Dr.Geiger.  Pt returned to PACU at this time and will cont pacu care until bed assignment rec'd.

## 2011-10-25 ENCOUNTER — Encounter (HOSPITAL_COMMUNITY): Payer: Self-pay | Admitting: Ophthalmology

## 2011-10-25 NOTE — Progress Notes (Signed)
D/c instructions given to pt and daughter. Both verbalized understanding of d/c instructions. Eye gtts and Bactroban given to pt. Out via wheelchair.

## 2011-10-29 NOTE — Discharge Summary (Signed)
DISCHARGE SUMMARY ADRIN JULIAN  10/29/2011  Diagnosis :  Retinal Detachment Right Eye   Quoc, Tome  Home Medication Instructions [id number]   Printed on:10/29/11 1406  Medication Information                    LORazepam (ATIVAN) 0.5 MG tablet Take 0.25 mg by mouth every 8 (eight) hours. TAKE 1/2 A TABLET BY MOUTH TWICE DAILY           meloxicam (MOBIC) 15 MG tablet Take 15 mg by mouth daily.            HYDROcodone-acetaminophen (VICODIN) 5-500 MG per tablet Take 1 tablet by mouth every 8 (eight) hours as needed. AS NEEDED FOR PAIN           pioglitazone (ACTOS) 45 MG tablet Take 45 mg by mouth daily.             simvastatin (ZOCOR) 40 MG tablet Take 40 mg by mouth daily.             lisinopril (PRINIVIL,ZESTRIL) 2.5 MG tablet Take 2.5 mg by mouth daily.             cyclobenzaprine (FLEXERIL) 10 MG tablet Take 10 mg by mouth 3 (three) times daily as needed.            glipiZIDE (GLUCOTROL) 10 MG tablet Take 10 mg by mouth 2 (two) times daily before a meal.             metFORMIN (GLUCOPHAGE) 500 MG tablet Take 500 mg by mouth 2 (two) times daily with a meal.             Tamsulosin HCl (FLOMAX) 0.4 MG CAPS Take by mouth daily. TAKE 30 MINS. DAILY AFTER SAME MEAL            dorzolamide (TRUSOPT) 2 % ophthalmic solution Place 1 drop into the right eye 3 (three) times daily.             prednisoLONE acetate (PRED FORTE) 1 % ophthalmic suspension Place 1 drop into the right eye 4 (four) times daily.             gabapentin (NEURONTIN) 100 MG capsule Take 200 mg by mouth at bedtime.           temazepam (RESTORIL) 30 MG capsule Take 30 mg by mouth at bedtime as needed. For sleep           sildenafil (VIAGRA) 50 MG tablet Take 50 mg by mouth daily as needed. For erectile dysfunction           traMADol (ULTRAM) 50 MG tablet Take 50 mg by mouth every 6 (six) hours as needed. For pain            Procedure performed: Pars Plana Vitrectomy, Membrane Peeling,  Fluid Gas Exchange OD  There were no complications, no specimens for pathology. He stayed overnight for observation only and was discharged to home for self care within less than 24 hours.  Discharge Medications were:  Zymaxid 1 gtt OD QID Prednisolone Acetate 1% 1 gtt OD QID    Olanda Downie, GREERMD

## 2012-12-30 ENCOUNTER — Emergency Department (HOSPITAL_COMMUNITY): Payer: Medicare FFS

## 2012-12-30 ENCOUNTER — Encounter (HOSPITAL_COMMUNITY): Payer: Self-pay | Admitting: Emergency Medicine

## 2012-12-30 ENCOUNTER — Emergency Department (HOSPITAL_COMMUNITY)
Admission: EM | Admit: 2012-12-30 | Discharge: 2012-12-30 | Disposition: A | Discharge status: Home / Self Care | Payer: Medicare FFS | Attending: Emergency Medicine | Admitting: Emergency Medicine

## 2012-12-30 DIAGNOSIS — R296 Repeated falls: Secondary | ICD-10-CM | POA: Insufficient documentation

## 2012-12-30 DIAGNOSIS — E7800 Pure hypercholesterolemia, unspecified: Secondary | ICD-10-CM | POA: Insufficient documentation

## 2012-12-30 DIAGNOSIS — S8990XA Unspecified injury of unspecified lower leg, initial encounter: Secondary | ICD-10-CM | POA: Insufficient documentation

## 2012-12-30 DIAGNOSIS — Y9389 Activity, other specified: Secondary | ICD-10-CM | POA: Insufficient documentation

## 2012-12-30 DIAGNOSIS — M129 Arthropathy, unspecified: Secondary | ICD-10-CM | POA: Insufficient documentation

## 2012-12-30 DIAGNOSIS — S99919A Unspecified injury of unspecified ankle, initial encounter: Secondary | ICD-10-CM | POA: Insufficient documentation

## 2012-12-30 DIAGNOSIS — Y9289 Other specified places as the place of occurrence of the external cause: Secondary | ICD-10-CM | POA: Insufficient documentation

## 2012-12-30 DIAGNOSIS — S8263XA Displaced fracture of lateral malleolus of unspecified fibula, initial encounter for closed fracture: Secondary | ICD-10-CM | POA: Insufficient documentation

## 2012-12-30 DIAGNOSIS — Z79899 Other long term (current) drug therapy: Secondary | ICD-10-CM | POA: Insufficient documentation

## 2012-12-30 DIAGNOSIS — S82892A Other fracture of left lower leg, initial encounter for closed fracture: Secondary | ICD-10-CM

## 2012-12-30 DIAGNOSIS — E119 Type 2 diabetes mellitus without complications: Secondary | ICD-10-CM | POA: Insufficient documentation

## 2012-12-30 DIAGNOSIS — Z87891 Personal history of nicotine dependence: Secondary | ICD-10-CM | POA: Insufficient documentation

## 2012-12-30 DIAGNOSIS — I10 Essential (primary) hypertension: Secondary | ICD-10-CM | POA: Insufficient documentation

## 2012-12-30 LAB — COMPREHENSIVE METABOLIC PANEL
Albumin: 3.3 g/dL — ABNORMAL LOW (ref 3.5–5.2)
Alkaline Phosphatase: 58 U/L (ref 39–117)
BUN: 20 mg/dL (ref 6–23)
CO2: 27 mEq/L (ref 19–32)
Chloride: 101 mEq/L (ref 96–112)
GFR calc Af Amer: 57 mL/min — ABNORMAL LOW (ref >90)
GFR calc non Af Amer: 49 mL/min — ABNORMAL LOW (ref >90)
Glucose, Bld: 118 mg/dL — ABNORMAL HIGH (ref 70–99)
Potassium: 4.5 mEq/L (ref 3.5–5.1)
Total Bilirubin: 0.7 mg/dL (ref 0.3–1.2)

## 2012-12-30 LAB — CBC
HCT: 33.7 % — ABNORMAL LOW (ref 39.0–52.0)
Hemoglobin: 11.3 g/dL — ABNORMAL LOW (ref 13.0–17.0)
RBC: 4.07 MIL/uL — ABNORMAL LOW (ref 4.22–5.81)
WBC: 10.6 10*3/uL — ABNORMAL HIGH (ref 4.0–10.5)

## 2012-12-30 MED ORDER — HYDROMORPHONE HCL PF 1 MG/ML IJ SOLN
0.5000 mg | Freq: Once | INTRAMUSCULAR | Status: AC
  Administered 2012-12-30: 0.5 mg via INTRAVENOUS
  Filled 2012-12-30: qty 1

## 2012-12-30 MED ORDER — HYDROCODONE-ACETAMINOPHEN 5-500 MG PO TABS: 1.0000 | ORAL_TABLET | Freq: Three times a day (TID) | ORAL | Status: DC | PRN

## 2012-12-30 NOTE — ED Provider Notes (Signed)
History     CSN: [id number]  Arrival date & time 12/30/12  1847   First MD Initiated Contact with Patient 12/30/12 1943      Chief Complaint  Patient presents with  . Knee Pain    Left  . Fall    (Consider location/radiation/quality/duration/timing/severity/associated sxs/prior treatment) HPI The patient presents after 2 falls with persistent pain throughout the left lower extremity.  The first was yesterday, when the patient was upended by his dog.  Just prior to arrival the patient noted a fall, and was unable to stand following that second fall.  He denies head trauma, loss of consciousness, confusion about events.  He also denies any pain in any other locations. The patient does note that there was some relief with Vicodin.  No distal dysesthesia or weakness. Past Medical History  Diagnosis Date  . Arthritis   . Diabetes mellitus   . Hypertension   . Hypercholesteremia     Past Surgical History  Procedure Laterality Date  . Tonsillectomy    . Joint replacement    . Appendectomy    . Pars plana vitrectomy  10/24/2011    Procedure: PARS PLANA VITRECTOMY WITH 23 GAUGE;  Surgeon: Shade Flood, MD;  Location: [facility] OR;  Service: Ophthalmology;  Laterality: Right;    No family history on file.  History  Substance Use Topics  . Smoking status: Former Games developer  . Smokeless tobacco: Not on file  . Alcohol Use: Yes     Comment: occasional      Review of Systems  Constitutional:       Per HPI, otherwise negative  HENT:       Per HPI, otherwise negative  Respiratory:       Per HPI, otherwise negative  Cardiovascular:       Per HPI, otherwise negative  Gastrointestinal: Negative for nausea, vomiting and abdominal pain.  Endocrine:       Negative aside from HPI  Genitourinary:       Neg aside from HPI   Musculoskeletal:       Per HPI, otherwise negative  Skin: Negative.   Neurological: Negative for syncope and weakness.    Allergies  Review of patient's allergies  indicates no known allergies.  Home Medications   Current Outpatient Rx  Name  Route  Sig  Dispense  Refill  . dorzolamide (TRUSOPT) 2 % ophthalmic solution   Both Eyes   Place 1 drop into both eyes 2 (two) times daily.          . furosemide (LASIX) 20 MG tablet   Oral   Take 20 mg by mouth every morning.         . gabapentin (NEURONTIN) 100 MG capsule   Oral   Take 200 mg by mouth at bedtime.         Marland Kitchen glipiZIDE (GLUCOTROL) 10 MG tablet   Oral   Take 10 mg by mouth 2 (two) times daily before a meal.          . HYDROcodone-acetaminophen (VICODIN) 5-500 MG per tablet   Oral   Take 1 tablet by mouth every 6 (six) hours as needed for pain.         Marland Kitchen lisinopril (PRINIVIL,ZESTRIL) 2.5 MG tablet   Oral   Take 2.5 mg by mouth every morning.          . metFORMIN (GLUCOPHAGE) 500 MG tablet   Oral   Take 500 mg by mouth 2 (two) times daily  with a meal.           . pioglitazone (ACTOS) 45 MG tablet   Oral   Take 45 mg by mouth every morning.          . prednisoLONE acetate (PRED FORTE) 1 % ophthalmic suspension   Left Eye   Place 2 drops into the left eye every morning.          . simvastatin (ZOCOR) 40 MG tablet   Oral   Take 40 mg by mouth every morning.          . Tamsulosin HCl (FLOMAX) 0.4 MG CAPS   Oral   Take 0.4 mg by mouth every morning. TAKE 30 MINS. DAILY AFTER SAME MEAL           BP 113/68  Pulse 113  Temp(Src) 98.2 F (36.8 C) (Oral)  Resp 20  SpO2 100%  Physical Exam  Nursing note and vitals reviewed. Constitutional: He is oriented to person, place, and time. He appears well-developed. No distress.  HENT:  Head: Normocephalic and atraumatic.  Eyes: Conjunctivae and EOM are normal.  Cardiovascular: Normal rate and regular rhythm.   Pulmonary/Chest: Effort normal. No stridor. No respiratory distress.  Abdominal: He exhibits no distension.  Musculoskeletal: He exhibits no edema.  There is pleural edema or present on the left.   The patient has tenderness to palpation of the ankle, knee, most prominently about the knee.  There is a fusion palpable throughout.  Range of motion is limited to 180/160.  Distal pulses and sensation are appropriate. The hips are stable.  The patient can flex each hip, though with pain in the left lower extremity.  Neurological: He is alert and oriented to person, place, and time.  Skin: Skin is warm and dry.  Psychiatric: He has a normal mood and affect.    ED Course  ORTHOPEDIC INJURY TREATMENT Date/Time: 12/30/2012 10:10 PM Performed by: Gerhard Munch Authorized by: Gerhard Munch Consent: Verbal consent obtained. The procedure was performed in an emergent situation. Risks and benefits: risks, benefits and alternatives were discussed Consent given by: patient Patient understanding: patient states understanding of the procedure being performed Patient consent: the patient's understanding of the procedure matches consent given Procedure consent: procedure consent matches procedure scheduled Relevant documents: relevant documents present and verified Test results: test results available and properly labeled Site marked: the operative site was marked Imaging studies: imaging studies available Required items: required blood products, implants, devices, and special equipment available Patient identity confirmed: verbally with patient Time out: Immediately prior to procedure a "time out" was called to verify the correct patient, procedure, equipment, support staff and site/side marked as required. Injury location: ankle Location details: left ankle Injury type: fracture Fracture type: tibial plafond Pre-procedure neurovascular assessment: neurovascularly intact Pre-procedure distal perfusion: normal Pre-procedure neurological function: normal Pre-procedure range of motion: reduced Local anesthesia used: no (systemic analgesia) Patient sedated: no Manipulation performed:  no Immobilization: crutches and brace Splint type: short leg Supplies used: cam walker. Post-procedure neurovascular assessment: post-procedure neurovascularly intact Post-procedure distal perfusion: normal Post-procedure neurological function: normal Post-procedure range of motion: unchanged Patient tolerance: Patient tolerated the procedure well with no immediate complications.   (including critical care time)  Labs Reviewed  CBC  COMPREHENSIVE METABOLIC PANEL   No results found.   No diagnosis found.  Pulse ox 90% room air normal  On repeat exam the patient remains in no distress.  Given his continued knee pain, CT was ordered   Update:  Following the return of labs, imaging, I discussed the findings with the patient and his family members.  With the avulsion fracture of the ankle the patient had a Cam Walker placed, was provided crutches.  We discussed, at length, the need for orthopedics followup both for evaluation of the avulsion fracture, but also for consideration of his ongoing knee pain absent radiographic evidence of fracture. MDM  This patient presents after a series of falls with ongoing knee and ankle pain.  On exam he is N/V intact and in no distress.  Images demonstrate an avulsion fracture of the tibiotalar ligament.  Patient d/c to f/u w ortho after a lengthy discussion on the need for F/U.        Gerhard Munch, MD 12/30/12 [phone]

## 2012-12-30 NOTE — ED Notes (Addendum)
Per EMS: Pt is from home, lives at home independently. Pt c/o 7/10 left knee pain after falling yesterday. Pt said he tripped after getting entangled in his dog's leash. EMS/family reports swelling in his knees, however this is baseline for patient. Pt does have an abrasion to left knee. EMS reports warmness to left knee. Pt c/o increased difficulty walking, which has changed from his baseline and currently using a walker to ambulate since yesterday.

## 2012-12-30 NOTE — ED Notes (Signed)
Bed:WHALA<BR> Expected date:<BR> Expected time:<BR> Means of arrival:<BR> Comments:<BR> 79 y/o M fall yesterday, knee pain

## 2013-01-06 ENCOUNTER — Telehealth (HOSPITAL_COMMUNITY): Payer: Self-pay | Admitting: Emergency Medicine

## 2013-01-06 NOTE — ED Notes (Addendum)
Call regarding Vicodin Rx. 5/500 mg not avi anymore informed RPh ok to change to Vicodin 5/325 mg w/same instructions.

## 2013-03-23 ENCOUNTER — Ambulatory Visit: Payer: Medicare Other | Admitting: Neurology

## 2013-04-06 ENCOUNTER — Ambulatory Visit: Payer: Medicare PPO | Admitting: Neurology

## 2013-04-23 ENCOUNTER — Encounter: Payer: Self-pay | Admitting: Neurology

## 2013-04-23 ENCOUNTER — Ambulatory Visit (INDEPENDENT_AMBULATORY_CARE_PROVIDER_SITE_OTHER): Payer: Medicare PPO | Admitting: Neurology

## 2013-04-23 VITALS — BP 125/75 | HR 120 | Ht 70.0 in | Wt 215.0 lb

## 2013-04-23 DIAGNOSIS — K5790 Diverticulosis of intestine, part unspecified, without perforation or abscess without bleeding: Secondary | ICD-10-CM

## 2013-04-23 DIAGNOSIS — G5600 Carpal tunnel syndrome, unspecified upper limb: Secondary | ICD-10-CM | POA: Insufficient documentation

## 2013-04-23 DIAGNOSIS — G4700 Insomnia, unspecified: Secondary | ICD-10-CM

## 2013-04-23 DIAGNOSIS — R413 Other amnesia: Secondary | ICD-10-CM

## 2013-04-23 DIAGNOSIS — K5730 Diverticulosis of large intestine without perforation or abscess without bleeding: Secondary | ICD-10-CM

## 2013-04-23 MED ORDER — DONEPEZIL HCL 10 MG PO TABS: 10.0000 mg | ORAL_TABLET | Freq: Every day | ORAL | Status: DC

## 2013-04-24 NOTE — Progress Notes (Signed)
[facility]  PATIENT: URIYAH RASKA DOB: 1920-10-08  HISTORICAL  Mr. Ihde is a 79 years old right-handed male, accompanied by her daughter, referred by her primary care physician Dr. Tyson Dense for evaluation of memory trouble  He had a past medical history of hypertension, diabetes, hyperlipidemia, prostate cancer, had a history of a left knee reconstruction surgery  He had sixth-years of education, is a retired Arboriculturist in 2007, he lives alone, still driving short distance, over the past one year, his daughter noticed that he has become forgetful, he forget to take his medications, he misplace things, he often gets out of his car, forgot to close his car door,  he also has mild gait difficulty, using a cane.  He denies sleep difficulty, has mild gait difficulty, he denies bowel and bladder incontinence, he can still pay his own bill, cooking simple meals without significant difficulty,   REVIEW OF SYSTEMS: Full 14 system review of systems performed and notable only for blurred vision, eye pain, swelling in legs, memory loss, confusion, insomnia, not enough sleep  ALLERGIES: No Known Allergies  HOME MEDICATIONS: Outpatient Prescriptions Prior to Visit  Medication Sig Dispense Refill  . dorzolamide (TRUSOPT) 2 % ophthalmic solution Place 1 drop into both eyes 2 (two) times daily.       . furosemide (LASIX) 20 MG tablet Take 20 mg by mouth every morning.      . gabapentin (NEURONTIN) 100 MG capsule Take 200 mg by mouth at bedtime.      Marland Kitchen glipiZIDE (GLUCOTROL) 10 MG tablet Take 10 mg by mouth 2 (two) times daily before a meal.       . HYDROcodone-acetaminophen (VICODIN) 5-500 MG per tablet Take 1 tablet by mouth every 8 (eight) hours as needed for pain.  10 tablet  0  . lisinopril (PRINIVIL,ZESTRIL) 2.5 MG tablet Take 2.5 mg by mouth every morning.       . metFORMIN (GLUCOPHAGE) 500 MG tablet Take 500 mg by mouth 2 (two) times daily with a meal.        .  pioglitazone (ACTOS) 45 MG tablet Take 45 mg by mouth every morning.       . prednisoLONE acetate (PRED FORTE) 1 % ophthalmic suspension Place 2 drops into the left eye every morning.       . simvastatin (ZOCOR) 40 MG tablet Take 40 mg by mouth every morning.       . Tamsulosin HCl (FLOMAX) 0.4 MG CAPS Take 0.4 mg by mouth every morning. TAKE 30 MINS. DAILY AFTER SAME MEAL       No facility-administered medications prior to visit.    PAST MEDICAL HISTORY: Past Medical History  Diagnosis Date  . Arthritis   . Diabetes mellitus   . Hypertension   . Hypercholesteremia   . Insomnia   . Carpal tunnel syndrome   . Diverticulosis     PAST SURGICAL HISTORY: Past Surgical History  Procedure Laterality Date  . Tonsillectomy    . Joint replacement    . Appendectomy    . Pars plana vitrectomy  10/24/2011    Procedure: PARS PLANA VITRECTOMY WITH 23 GAUGE;  Surgeon: Shade Flood, MD;  Location: [facility] OR;  Service: Ophthalmology;  Laterality: Right;    FAMILY HISTORY: Family History  Problem Relation Age of Onset  . Cancer Mother     uterus  . Heart Problems Father   . Cancer Brother     stomach  . Cancer Brother  throat  . Colon cancer Brother     SOCIAL HISTORY:  History   Social History  . Marital Status: Widowed    Spouse Name: N/A    Number of Children: N/A  . Years of Education: N/A   Occupational History  . Not on file.   Social History Main Topics  . Smoking status: Former Games developer  . Smokeless tobacco: Never Used  . Alcohol Use: Yes     Comment: occasional  . Drug Use: No  . Sexually Active: Not on file   Other Topics Concern  . Not on file   Social History Narrative  . No narrative on file     PHYSICAL EXAM  Filed Vitals:   04/23/13 1506  BP: 125/75  Pulse: 120  Height: 5\' 10"  (1.778 m)  Weight: 215 lb (97.523 kg)    Not recorded    Body mass index is 30.85 kg/(m^2).   Generalized: In no acute distress  Neck: Supple, no carotid bruits    Cardiac: Regular rate rhythm  Pulmonary: Clear to auscultation bilaterally  Musculoskeletal: No deformity  Neurological examination  Mentation: Alert, following command, MMSE 17/30, he is not oriented to year, date, season, doctor, missed 3/3 flag, could not do serial 7s, animal naming 18.  Cranial nerve II-XII: Pupils were equal round reactive to light extraocular movements were full, visual field were full on confrontational test. facial sensation and strength were normal. hearing was intact to finger rubbing bilaterally. Uvula tongue midline.  head turning and shoulder shrug and were normal and symmetric.Tongue protrusion into cheek strength was normal.  Motor: normal tone, bulk and strength.  Sensory: Intact to fine touch, pinprick, preserved vibratory sensation, and proprioception at toes.  Coordination: Normal finger to nose, heel-to-shin bilaterally there was no truncal ataxia  Gait: cautious, leaning forward.   Romberg signs: Negative  Deep tendon reflexes: Brachioradialis 2/2, biceps 2/2, triceps 2/2, patellar 2/2, Achilles absent, plantar responses were flexor bilaterally.   DIAGNOSTIC DATA (LABS, IMAGING, TESTING) - I reviewed patient records, labs, notes, testing and imaging myself where available.  Lab Results  Component Value Date   WBC 10.6* 12/30/2012   HGB 11.3* 12/30/2012   HCT 33.7* 12/30/2012   MCV 82.8 12/30/2012   PLT 195 12/30/2012      Component Value Date/Time   NA 137 12/30/2012 2105   K 4.5 12/30/2012 2105   CL 101 12/30/2012 2105   CO2 27 12/30/2012 2105   GLUCOSE 118* 12/30/2012 2105   BUN 20 12/30/2012 2105   CREATININE 1.25 12/30/2012 2105   CALCIUM 9.3 12/30/2012 2105   PROT 6.9 12/30/2012 2105   ALBUMIN 3.3* 12/30/2012 2105   AST 24 12/30/2012 2105   ALT 11 12/30/2012 2105   ALKPHOS 58 12/30/2012 2105   BILITOT 0.7 12/30/2012 2105   GFRNONAA 49* 12/30/2012 2105   GFRAA 57* 12/30/2012 2105    ASSESSMENT AND PLAN   79 years old right-handed  male, gradual onset of memory trouble, Mini-Mental Status Examination was 17 out of 30,  consistent with dementia  1. Complete evaluation with MRI of the brain, 2. laboratory evaluation on next visit 3. start Aricept 10 mg every day.   Levert Feinstein, M.D. Ph.D.  [facility] [address], [address] [place], [place] [address] [phone]

## 2013-06-17 ENCOUNTER — Other Ambulatory Visit: Payer: Medicare Other

## 2013-06-18 ENCOUNTER — Ambulatory Visit
Admission: RE | Admit: 2013-06-18 | Discharge: 2013-06-18 | Disposition: A | Discharge status: Home / Self Care | Payer: Medicare FFS | Source: Ambulatory Visit | Attending: Neurology | Admitting: Neurology

## 2013-06-18 DIAGNOSIS — R413 Other amnesia: Secondary | ICD-10-CM

## 2013-06-18 DIAGNOSIS — K5790 Diverticulosis of intestine, part unspecified, without perforation or abscess without bleeding: Secondary | ICD-10-CM

## 2013-06-18 DIAGNOSIS — G4700 Insomnia, unspecified: Secondary | ICD-10-CM

## 2013-06-18 DIAGNOSIS — G5600 Carpal tunnel syndrome, unspecified upper limb: Secondary | ICD-10-CM

## 2013-06-20 NOTE — Progress Notes (Signed)
Quick Note:  Please call patient, MRI brain showed age related changes, no acute lesions. ______ 

## 2013-06-22 NOTE — Progress Notes (Signed)
I LMVM for daughter, [name].

## 2013-06-23 NOTE — Progress Notes (Signed)
Quick Note:  I called and spoke to daughter, [name], [phone] re: MRI results for father. FAxed copy to her also [phone]. ______

## 2013-06-26 ENCOUNTER — Ambulatory Visit: Payer: Medicare PPO | Admitting: Neurology

## 2013-08-04 ENCOUNTER — Ambulatory Visit (INDEPENDENT_AMBULATORY_CARE_PROVIDER_SITE_OTHER): Payer: Medicare PPO | Admitting: Neurology

## 2013-08-04 ENCOUNTER — Encounter: Payer: Self-pay | Admitting: Neurology

## 2013-08-04 VITALS — BP 115/66 | HR 99 | Ht 70.0 in | Wt 219.0 lb

## 2013-08-04 DIAGNOSIS — F0390 Unspecified dementia without behavioral disturbance: Secondary | ICD-10-CM

## 2013-08-04 NOTE — Progress Notes (Signed)
[facility]  PATIENT: Marvin Bishop DOB: January 29, 1920  HISTORICAL  Mr. Molina is a 79 years old right-handed male, accompanied by her daughter, referred by her primary care physician Dr. Tyson Dense for evaluation of memory trouble  He had a past medical history of hypertension, diabetes, hyperlipidemia, prostate cancer, had a history of a left knee reconstruction surgery  He had sixth-years of education, is a retired Arboriculturist in 2007, he lives alone, still driving short distance, over the past one year, his daughter noticed that he has become forgetful, he forget to take his medications, he misplace things, he often gets out of his car, forgot to close his car door,  he also has mild gait difficulty, using a cane.  He denies sleep difficulty, has mild gait difficulty, he denies bowel and bladder incontinence, he can still pay his own bill, cooking simple meals without significant difficulty. He still lives by himself, he still drives short distance, has not got lost yet.  UPDATE Aug 04 2013:  MRI showed  mild changes of chronic microvascular ischemia and generalized cerebral atrophy.  Lab Jan 2014, normal CBX, CMP, A1c 6.1, LDL 100, TSH.  He is taking over counter MVI, he has tried Aricept, but has lost appetite, daughter does not want him to try any medications recently.  REVIEW OF SYSTEMS: Full 14 system review of systems performed and notable only for blurred vision, eye pain, swelling in legs, memory loss, confusion, insomnia, not enough sleep  ALLERGIES: No Known Allergies  HOME MEDICATIONS: Outpatient Prescriptions Prior to Visit  Medication Sig Dispense Refill  . cephALEXin (KEFLEX) 500 MG capsule       . donepezil (ARICEPT) 10 MG tablet Take 1 tablet (10 mg total) by mouth at bedtime.  30 tablet  12  . dorzolamide (TRUSOPT) 2 % ophthalmic solution Place 1 drop into both eyes 2 (two) times daily.       . dorzolamide-timolol (COSOPT) 22.3-6.8 MG/ML  ophthalmic solution       . esomeprazole (NEXIUM) 40 MG capsule Take 40 mg by mouth daily before breakfast.      . furosemide (LASIX) 20 MG tablet Take 20 mg by mouth every morning.      . gabapentin (NEURONTIN) 100 MG capsule Take 200 mg by mouth at bedtime.      Marland Kitchen glipiZIDE (GLUCOTROL) 10 MG tablet Take 10 mg by mouth 2 (two) times daily before a meal.       . HYDROcodone-acetaminophen (VICODIN) 5-500 MG per tablet Take 1 tablet by mouth every 8 (eight) hours as needed for pain.  10 tablet  0  . latanoprost (XALATAN) 0.005 % ophthalmic solution       . lisinopril (PRINIVIL,ZESTRIL) 2.5 MG tablet Take 2.5 mg by mouth every morning.       Marland Kitchen LORazepam (ATIVAN) 0.5 MG tablet Take 0.5 mg by mouth every 8 (eight) hours.      . meloxicam (MOBIC) 7.5 MG tablet       . metFORMIN (GLUCOPHAGE) 500 MG tablet Take 500 mg by mouth 2 (two) times daily with a meal.        . pioglitazone (ACTOS) 45 MG tablet Take 45 mg by mouth every morning.       . prednisoLONE acetate (PRED FORTE) 1 % ophthalmic suspension Place 2 drops into the left eye every morning.       . sildenafil (VIAGRA) 50 MG tablet Take 50 mg by mouth daily as needed for erectile dysfunction.      Marland Kitchen  simvastatin (ZOCOR) 40 MG tablet Take 40 mg by mouth every morning.       . Tamsulosin HCl (FLOMAX) 0.4 MG CAPS Take 0.4 mg by mouth every morning. TAKE 30 MINS. DAILY AFTER SAME MEAL      . temazepam (RESTORIL) 30 MG capsule Take 30 mg by mouth at bedtime as needed for sleep.      . traMADol (ULTRAM) 50 MG tablet Take 50 mg by mouth every 6 (six) hours as needed for pain.       No facility-administered medications prior to visit.    PAST MEDICAL HISTORY: Past Medical History  Diagnosis Date  . Arthritis   . Diabetes mellitus   . Hypertension   . Hypercholesteremia   . Insomnia   . Carpal tunnel syndrome   . Diverticulosis     PAST SURGICAL HISTORY: Past Surgical History  Procedure Laterality Date  . Tonsillectomy    . Joint  replacement    . Appendectomy    . Pars plana vitrectomy  10/24/2011    Procedure: PARS PLANA VITRECTOMY WITH 23 GAUGE;  Surgeon: Shade Flood, MD;  Location: [facility] OR;  Service: Ophthalmology;  Laterality: Right;    FAMILY HISTORY: Family History  Problem Relation Age of Onset  . Cancer Mother     uterus  . Heart Problems Father   . Cancer Brother     stomach  . Cancer Brother     throat  . Colon cancer Brother     SOCIAL HISTORY:    PHYSICAL EXAM  Filed Vitals:   08/04/13 1435  BP: 115/66  Pulse: 99  Height: 5\' 10"  (1.778 m)  Weight: 219 lb (99.338 kg)    Not recorded    Body mass index is 31.42 kg/(m^2).   Generalized: In no acute distress  Neck: Supple, no carotid bruits   Cardiac: Regular rate rhythm  Pulmonary: Clear to auscultation bilaterally  Musculoskeletal: No deformity  Neurological examination  Mentation: Alert, following command, MMSE 22/30, he is not oriented to year, date, season, doctor, missed 2/3 flag, could not do serial 7s, animal naming 14  Cranial nerve II-XII: Left pupil was round reactive to light. He has static right ptosis, irregular pupil. extraocular movements were full, visual field were full on confrontational test. facial sensation and strength were normal. hearing was intact to finger rubbing bilaterally. Uvula tongue midline.  head turning and shoulder shrug and were normal and symmetric.Tongue protrusion into cheek strength was normal.  Motor: normal tone, bulk and strength.  Sensory: Intact to fine touch, pinprick, preserved vibratory sensation, and proprioception at toes.  Coordination: Normal finger to nose, heel-to-shin bilaterally there was no truncal ataxia  Gait: cautious, leaning forward, mild atalgic  Romberg signs: Negative  Deep tendon reflexes: Brachioradialis 2/2, biceps 2/2, triceps 2/2, patellar 2/2, Achilles absent, plantar responses were flexor bilaterally.   DIAGNOSTIC DATA (LABS, IMAGING, TESTING) - I  reviewed patient records, labs, notes, testing and imaging myself where available.  Lab Results  Component Value Date   WBC 10.6* 12/30/2012   HGB 11.3* 12/30/2012   HCT 33.7* 12/30/2012   MCV 82.8 12/30/2012   PLT 195 12/30/2012      Component Value Date/Time   NA 137 12/30/2012 2105   K 4.5 12/30/2012 2105   CL 101 12/30/2012 2105   CO2 27 12/30/2012 2105   GLUCOSE 118* 12/30/2012 2105   BUN 20 12/30/2012 2105   CREATININE 1.25 12/30/2012 2105   CALCIUM 9.3 12/30/2012 2105   PROT  6.9 12/30/2012 2105   ALBUMIN 3.3* 12/30/2012 2105   AST 24 12/30/2012 2105   ALT 11 12/30/2012 2105   ALKPHOS 58 12/30/2012 2105   BILITOT 0.7 12/30/2012 2105   GFRNONAA 49* 12/30/2012 2105   GFRAA 57* 12/30/2012 2105    ASSESSMENT AND PLAN   79 years old right-handed male, gradual onset of memory trouble, Mini-Mental Status Examination was 22 out of 30,  consistent with dementia  1. I have discussed with patient and his daughter, both of them does not want to try any medication at this point. 2.  I have encouraged him moderate exercise.   Levert Feinstein, M.D. Ph.D.  [facility] [address], [address] [place], [place] [address] [phone]

## 2013-08-05 LAB — C-REACTIVE PROTEIN: CRP: 1.1 mg/L (ref 0.0–4.9)

## 2013-08-05 LAB — ANA: Anti Nuclear Antibody(ANA): NEGATIVE

## 2013-08-05 LAB — SEDIMENTATION RATE: Sed Rate: 5 mm/hr (ref 0–30)

## 2013-08-05 LAB — VITAMIN B12: Vitamin B-12: 399 pg/mL (ref 211–946)

## 2013-08-05 NOTE — Progress Notes (Signed)
Quick Note:  Please call patient for normal labs. ______ 

## 2013-08-13 NOTE — Progress Notes (Signed)
Quick Note:  Shared normal labs with patient thru VM message ______ 

## 2013-08-18 ENCOUNTER — Other Ambulatory Visit: Payer: Self-pay | Admitting: Neurology

## 2013-08-19 ENCOUNTER — Telehealth: Payer: Self-pay | Admitting: Neurology

## 2013-08-20 ENCOUNTER — Encounter: Payer: Self-pay | Admitting: Podiatry

## 2013-08-20 ENCOUNTER — Ambulatory Visit (INDEPENDENT_AMBULATORY_CARE_PROVIDER_SITE_OTHER): Payer: Medicare PPO | Admitting: Podiatry

## 2013-08-20 VITALS — BP 120/65 | HR 92 | Resp 16

## 2013-08-20 DIAGNOSIS — M79609 Pain in unspecified limb: Secondary | ICD-10-CM

## 2013-08-20 DIAGNOSIS — B351 Tinea unguium: Secondary | ICD-10-CM

## 2013-08-21 NOTE — Telephone Encounter (Signed)
Spoke with patient's daughter, [name], concerning medication donepezil. Pt's daughter informed me that they had stop the pt from taking the medication and wanted to start back giving the medication to the patient. Pharmacy had already discard the script, so I gave a verbal order over the phone to start the medication again base on the office note on 04/23/13. The prescription had 12 refills. Pharmacy verbalized understanding.

## 2013-08-22 ENCOUNTER — Inpatient Hospital Stay (HOSPITAL_COMMUNITY)
Admission: EM | Admit: 2013-08-22 | Discharge: 2013-08-23 | DRG: 694 | Disposition: A | Discharge status: Home / Self Care | Payer: Medicare FFS | Attending: Internal Medicine | Admitting: Internal Medicine

## 2013-08-22 ENCOUNTER — Emergency Department (HOSPITAL_COMMUNITY): Payer: Medicare FFS

## 2013-08-22 ENCOUNTER — Encounter (HOSPITAL_COMMUNITY): Payer: Self-pay | Admitting: Emergency Medicine

## 2013-08-22 DIAGNOSIS — F0390 Unspecified dementia without behavioral disturbance: Secondary | ICD-10-CM | POA: Diagnosis present

## 2013-08-22 DIAGNOSIS — N201 Calculus of ureter: Principal | ICD-10-CM | POA: Diagnosis present

## 2013-08-22 DIAGNOSIS — E119 Type 2 diabetes mellitus without complications: Secondary | ICD-10-CM

## 2013-08-22 DIAGNOSIS — E860 Dehydration: Secondary | ICD-10-CM | POA: Diagnosis present

## 2013-08-22 DIAGNOSIS — E7800 Pure hypercholesterolemia, unspecified: Secondary | ICD-10-CM | POA: Diagnosis present

## 2013-08-22 DIAGNOSIS — E1159 Type 2 diabetes mellitus with other circulatory complications: Secondary | ICD-10-CM | POA: Diagnosis present

## 2013-08-22 DIAGNOSIS — N200 Calculus of kidney: Secondary | ICD-10-CM | POA: Diagnosis present

## 2013-08-22 DIAGNOSIS — Z87891 Personal history of nicotine dependence: Secondary | ICD-10-CM

## 2013-08-22 DIAGNOSIS — Z7982 Long term (current) use of aspirin: Secondary | ICD-10-CM

## 2013-08-22 DIAGNOSIS — I10 Essential (primary) hypertension: Secondary | ICD-10-CM | POA: Diagnosis present

## 2013-08-22 DIAGNOSIS — Z8639 Personal history of other endocrine, nutritional and metabolic disease: Secondary | ICD-10-CM | POA: Diagnosis present

## 2013-08-22 DIAGNOSIS — G4700 Insomnia, unspecified: Secondary | ICD-10-CM | POA: Diagnosis present

## 2013-08-22 DIAGNOSIS — E872 Acidosis, unspecified: Secondary | ICD-10-CM

## 2013-08-22 DIAGNOSIS — E876 Hypokalemia: Secondary | ICD-10-CM | POA: Diagnosis present

## 2013-08-22 DIAGNOSIS — R109 Unspecified abdominal pain: Secondary | ICD-10-CM

## 2013-08-22 DIAGNOSIS — M129 Arthropathy, unspecified: Secondary | ICD-10-CM | POA: Diagnosis present

## 2013-08-22 DIAGNOSIS — R1110 Vomiting, unspecified: Secondary | ICD-10-CM

## 2013-08-22 HISTORY — DX: Calculus of kidney: N20.0

## 2013-08-22 HISTORY — DX: Unspecified dementia, unspecified severity, without behavioral disturbance, psychotic disturbance, mood disturbance, and anxiety: F03.90

## 2013-08-22 LAB — POCT I-STAT TROPONIN I: Troponin i, poc: 0 ng/mL (ref 0.00–0.08)

## 2013-08-22 LAB — COMPREHENSIVE METABOLIC PANEL
ALT: 10 U/L (ref 0–53)
AST: 18 U/L (ref 0–37)
Albumin: 3.8 g/dL (ref 3.5–5.2)
BUN: 16 mg/dL (ref 6–23)
CO2: 21 mEq/L (ref 19–32)
Calcium: 9.7 mg/dL (ref 8.4–10.5)
Chloride: 103 mEq/L (ref 96–112)
Creatinine, Ser: 1.3 mg/dL (ref 0.50–1.35)
GFR calc non Af Amer: 46 mL/min — ABNORMAL LOW (ref >90)
Sodium: 142 mEq/L (ref 135–145)
Total Bilirubin: 0.7 mg/dL (ref 0.3–1.2)

## 2013-08-22 LAB — CBC WITH DIFFERENTIAL/PLATELET
Basophils Absolute: 0 10*3/uL (ref 0.0–0.1)
Basophils Relative: 0 % (ref 0–1)
HCT: 36.7 % — ABNORMAL LOW (ref 39.0–52.0)
Lymphocytes Relative: 20 % (ref 12–46)
Lymphs Abs: 2.4 10*3/uL (ref 0.7–4.0)
MCHC: 34.6 g/dL (ref 30.0–36.0)
Monocytes Absolute: 0.6 10*3/uL (ref 0.1–1.0)
Neutro Abs: 9.2 10*3/uL — ABNORMAL HIGH (ref 1.7–7.7)
Neutrophils Relative %: 75 % (ref 43–77)
Platelets: 237 10*3/uL (ref 150–400)
RBC: 4.43 MIL/uL (ref 4.22–5.81)
RDW: 14.6 % (ref 11.5–15.5)
WBC: 12.3 10*3/uL — ABNORMAL HIGH (ref 4.0–10.5)

## 2013-08-22 LAB — LACTIC ACID, PLASMA
Lactic Acid, Venous: 5.4 mmol/L — ABNORMAL HIGH (ref 0.5–2.2)
Lactic Acid, Venous: 2.1 mmol/L (ref 0.5–2.2)

## 2013-08-22 LAB — URINALYSIS, ROUTINE W REFLEX MICROSCOPIC
Glucose, UA: NEGATIVE mg/dL
Leukocytes, UA: NEGATIVE
Protein, ur: NEGATIVE mg/dL
Urobilinogen, UA: 0.2 mg/dL (ref 0.0–1.0)
pH: 5 (ref 5.0–8.0)

## 2013-08-22 LAB — URINE MICROSCOPIC-ADD ON

## 2013-08-22 LAB — TROPONIN I: Troponin I: <0.3 ng/mL (ref <0.30)

## 2013-08-22 LAB — GLUCOSE, CAPILLARY: Glucose-Capillary: 136 mg/dL — ABNORMAL HIGH (ref 70–99)

## 2013-08-22 MED ORDER — INSULIN ASPART 100 UNIT/ML ~~LOC~~ SOLN: 0.0000 [IU] | Freq: Three times a day (TID) | SUBCUTANEOUS | Status: DC

## 2013-08-22 MED ORDER — HYDROCODONE-ACETAMINOPHEN 5-325 MG PO TABS: 1.0000 | ORAL_TABLET | ORAL | Status: DC | PRN

## 2013-08-22 MED ORDER — DORZOLAMIDE HCL-TIMOLOL MAL 2-0.5 % OP SOLN
1.0000 [drp] | Freq: Two times a day (BID) | OPHTHALMIC | Status: DC
  Administered 2013-08-22 – 2013-08-23 (×2): 1 [drp] via OPHTHALMIC
  Filled 2013-08-22: qty 10

## 2013-08-22 MED ORDER — ONDANSETRON HCL 4 MG PO TABS: 4.0000 mg | ORAL_TABLET | Freq: Four times a day (QID) | ORAL | Status: DC | PRN

## 2013-08-22 MED ORDER — SODIUM CHLORIDE 0.9 % IJ SOLN: 3.0000 mL | Freq: Two times a day (BID) | INTRAMUSCULAR | Status: DC

## 2013-08-22 MED ORDER — INSULIN ASPART 100 UNIT/ML ~~LOC~~ SOLN: 0.0000 [IU] | Freq: Every day | SUBCUTANEOUS | Status: DC

## 2013-08-22 MED ORDER — IOHEXOL 300 MG/ML  SOLN
100.0000 mL | Freq: Once | INTRAMUSCULAR | Status: AC | PRN
  Administered 2013-08-22: 100 mL via INTRAVENOUS

## 2013-08-22 MED ORDER — SODIUM CHLORIDE 0.9 % IV SOLN
INTRAVENOUS | Status: AC
  Administered 2013-08-22: via INTRAVENOUS

## 2013-08-22 MED ORDER — PREDNISOLONE ACETATE 1 % OP SUSP
1.0000 [drp] | Freq: Every day | OPHTHALMIC | Status: DC
  Administered 2013-08-23: 1 [drp] via OPHTHALMIC
  Filled 2013-08-22: qty 1

## 2013-08-22 MED ORDER — ENOXAPARIN SODIUM 40 MG/0.4ML ~~LOC~~ SOLN
40.0000 mg | SUBCUTANEOUS | Status: DC
  Administered 2013-08-23: 40 mg via SUBCUTANEOUS
  Filled 2013-08-22: qty 0.4

## 2013-08-22 MED ORDER — POTASSIUM CHLORIDE IN NACL 20-0.9 MEQ/L-% IV SOLN
Freq: Once | INTRAVENOUS | Status: AC
  Administered 2013-08-22: 20:00:00 via INTRAVENOUS
  Filled 2013-08-22: qty 1000

## 2013-08-22 MED ORDER — LISINOPRIL 2.5 MG PO TABS
2.5000 mg | ORAL_TABLET | Freq: Every day | ORAL | Status: DC
  Administered 2013-08-23: 2.5 mg via ORAL
  Filled 2013-08-22: qty 1

## 2013-08-22 MED ORDER — ONDANSETRON HCL 4 MG/2ML IJ SOLN
4.0000 mg | Freq: Once | INTRAMUSCULAR | Status: AC
  Administered 2013-08-22: 4 mg via INTRAVENOUS
  Filled 2013-08-22: qty 2

## 2013-08-22 MED ORDER — SIMVASTATIN 40 MG PO TABS
40.0000 mg | ORAL_TABLET | Freq: Every day | ORAL | Status: DC
  Administered 2013-08-23: 40 mg via ORAL
  Filled 2013-08-22: qty 1

## 2013-08-22 MED ORDER — TAMSULOSIN HCL 0.4 MG PO CAPS
0.4000 mg | ORAL_CAPSULE | Freq: Every day | ORAL | Status: DC
  Administered 2013-08-23: 0.4 mg via ORAL
  Filled 2013-08-22: qty 1

## 2013-08-22 MED ORDER — POTASSIUM CHLORIDE CRYS ER 20 MEQ PO TBCR
40.0000 meq | EXTENDED_RELEASE_TABLET | Freq: Once | ORAL | Status: AC
  Administered 2013-08-22: 40 meq via ORAL
  Filled 2013-08-22: qty 2

## 2013-08-22 MED ORDER — ACETAMINOPHEN 325 MG PO TABS: 650.0000 mg | ORAL_TABLET | Freq: Four times a day (QID) | ORAL | Status: DC | PRN

## 2013-08-22 MED ORDER — ASPIRIN EC 81 MG PO TBEC
81.0000 mg | DELAYED_RELEASE_TABLET | Freq: Every day | ORAL | Status: DC
  Administered 2013-08-23: 81 mg via ORAL
  Filled 2013-08-22: qty 1

## 2013-08-22 MED ORDER — DOCUSATE SODIUM 100 MG PO CAPS
100.0000 mg | ORAL_CAPSULE | Freq: Two times a day (BID) | ORAL | Status: DC
  Administered 2013-08-22 – 2013-08-23 (×2): 100 mg via ORAL
  Filled 2013-08-22 (×3): qty 1

## 2013-08-22 MED ORDER — LATANOPROST 0.005 % OP SOLN
1.0000 [drp] | Freq: Every day | OPHTHALMIC | Status: DC
  Administered 2013-08-22: 1 [drp] via OPHTHALMIC
  Filled 2013-08-22: qty 2.5

## 2013-08-22 MED ORDER — ACETAMINOPHEN 650 MG RE SUPP: 650.0000 mg | Freq: Four times a day (QID) | RECTAL | Status: DC | PRN

## 2013-08-22 MED ORDER — POTASSIUM CHLORIDE 10 MEQ/100ML IV SOLN
10.0000 meq | INTRAVENOUS | Status: AC
  Administered 2013-08-22 – 2013-08-23 (×2): 10 meq via INTRAVENOUS
  Filled 2013-08-22 (×2): qty 100

## 2013-08-22 MED ORDER — SODIUM CHLORIDE 0.9 % IV BOLUS (SEPSIS)
500.0000 mL | Freq: Once | INTRAVENOUS | Status: AC
  Administered 2013-08-22: 500 mL via INTRAVENOUS

## 2013-08-22 MED ORDER — ONDANSETRON HCL 4 MG/2ML IJ SOLN: 4.0000 mg | Freq: Four times a day (QID) | INTRAMUSCULAR | Status: DC | PRN

## 2013-08-22 NOTE — ED Notes (Signed)
Pt continues to vomit.  Order for Zofran received.

## 2013-08-22 NOTE — Consult Note (Signed)
I've been asked to see this patient by Dr. Darlys Gales, M.D. for evaluation and management of a right distal ureter stone.  ID: 79 year old gentleman who presents to the emergency department with 6 hours of nausea and vomiting.  History of present illness: The patient presented to the emergency department via 911 after 6 hours of nausea and vomiting and confusion. Patient has a history of dementia and is not a very good historian, but he is not complaining of any pain currently. His daughters relate that the patient has had intermittent right low back pain since early this summer. However, the pain hasn't been bad recently, as the patient has been out in the yard raking leaves every day this week. The patient has not had any fevers or suprapubic pain. He denies any changes to his lower urinary tract symptoms. Denies any gross hematuria or dysuria. Denies any changes to his bowel movements.  ROS: A complete review of systems was obtained, this was limited by the patient's dementia, pertinent positives are noted above.  Patient Active Problem List   Diagnosis Date Noted  . Renal calculus or stone 08/22/2013  . Hypokalemia 08/22/2013  . Hypertension 08/22/2013  . Diabetes mellitus 08/22/2013  . Dehydration 08/22/2013  . Memory loss 04/23/2013  . Insomnia   . Carpal tunnel syndrome   . Diverticulosis    Past Medical History  Diagnosis Date  . Arthritis   . Diabetes mellitus   . Hypertension   . Hypercholesteremia   . Insomnia   . Carpal tunnel syndrome   . Diverticulosis   . Nephrolithiasis     right UVJ stone  . Dementia    Past Surgical History  Procedure Laterality Date  . Tonsillectomy    . Joint replacement    . Appendectomy    . Pars plana vitrectomy  10/24/2011    Procedure: PARS PLANA VITRECTOMY WITH 23 GAUGE;  Surgeon: Shade Flood, MD;  [facility] OR;  Service: Ophthalmology;  Laterality: Right;   History   Social History  . Marital Status: Widowed    Spouse Name:  N/A    Number of Children: 5  . Years of Education: 7 th   Occupational History  .      Retired   Social History Main Topics  . Smoking status: Former Smoker    Types: Cigarettes  . Smokeless tobacco: Never Used     Comment: Quit more than 30 years ago.  . Alcohol Use: 0.6 oz/week    1 Shots of liquor per week     Comment: occasional  . Drug Use: No  . Sexual Activity: Not on file   Other Topics Concern  . Not on file   Social History Narrative   Patient lives at home alone with dog.   Retired.   Education.- 7 th  Grade   Left handed.   Caffeine-Soda , tea and coffee            No current facility-administered medications on file prior to encounter.   Current Outpatient Prescriptions on File Prior to Encounter  Medication Sig Dispense Refill  . donepezil (ARICEPT) 10 MG tablet Take 1 tablet (10 mg total) by mouth at bedtime.  30 tablet  12  . dorzolamide-timolol (COSOPT) 22.3-6.8 MG/ML ophthalmic solution Place 1 drop into the right eye 2 (two) times daily.       . furosemide (LASIX) 20 MG tablet Take 20 mg by mouth daily.       Marland Kitchen glipiZIDE (GLUCOTROL)  10 MG tablet Take 10 mg by mouth 2 (two) times daily before a meal.       . latanoprost (XALATAN) 0.005 % ophthalmic solution Place 1 drop into the right eye at bedtime.       Marland Kitchen lisinopril (PRINIVIL,ZESTRIL) 2.5 MG tablet Take 2.5 mg by mouth daily.       . metFORMIN (GLUCOPHAGE) 500 MG tablet Take 500 mg by mouth 2 (two) times daily with a meal.        . pioglitazone (ACTOS) 45 MG tablet Take 45 mg by mouth daily.       . prednisoLONE acetate (PRED FORTE) 1 % ophthalmic suspension Place 1 drop into the right eye daily.       . simvastatin (ZOCOR) 40 MG tablet Take 40 mg by mouth daily.        PE Filed Vitals:   08/22/13 2100 08/22/13 2115 08/22/13 2130 08/22/13 2145  BP: 120/87 129/69 132/62 154/62  Pulse: 72 87    Temp:      TempSrc:      Resp: 18 22 17 13   SpO2: 100% 100%     No acute distress, alert and  oriented Atraumatic normocephalic head, right eye closed/abnormal Nonlabored breathing Heart rate is regular no peripheral edema Abdomen is soft nontender nondistended, no CVA tenderness, no suprapubic tenderness Patient is circumcised, Foley is in place draining clear yellow urine, there no genital lesions noted Extremities are symmetric bilaterally with no appreciable edema, 5 out of 5 strength Bright affect, appropriate mood   Recent Labs  08/22/13 1543  WBC 12.3*  HGB 12.7*  HCT 36.7*    Recent Labs  08/22/13 1543  NA 142  K 2.8*  CL 103  CO2 21  GLUCOSE 196*  BUN 16  CREATININE 1.30  CALCIUM 9.7   No results found for this basename: LABPT, INR,  in the last 72 hours No results found for this basename: LABURIN,  in the last 72 hours Results for orders placed during the hospital encounter of 10/24/11  SURGICAL PCR SCREEN     Status: Abnormal   Collection Time    10/24/11  1:28 PM      Result Value Range Status   MRSA, PCR NEGATIVE  NEGATIVE Final   Staphylococcus aureus POSITIVE (*) NEGATIVE Final   Comment:            The Xpert SA Assay (FDA     approved for NASAL specimens     only), is one component of     a comprehensive surveillance     program.  It is not intended     to diagnose infection nor to     guide or monitor treatment.   UA: Negative for evidence of infection, no white blood cells, no red blood cells  Lactate 5.4, repeat lactate 4 hours later 2.0  CT scan, long prep with by mouth and IV contrast abdomen/pelvis: I've independently reviewed the images. They show a 8-10 mm stone at the right UVJ nearly into the transmural bladder with associated reaction/edematous tissue. There is no hydronephrosis or perinephric stranding.  Impression: 79 year old gentleman who presented to the emergency department with severe nausea and vomiting with associated dehydration. Workup revealed a nonobstructing distal right UVJ stone with periureteral  thickening/reaction suggestive of impaction and that it has been there for a while. It is unclear to me whether this is the source of his nausea vomiting or is a separate process. His labs and urinalysis are  not suggestive of pyelonephritis or any urinary tract infection.  Recommendations: I went over the treatment options for a non-obstructive distal ureteral stone. I conveyed to the family that I wasn't convinced that the stone was the source of the patient's vomiting and dehydration given its appearance on CAT scan with no hydronephrosis and thickening of the tissue around the stone suggesting a chronic process. Further, given the patient's comorbidities including his age and diabetes with peripheral manifestations I would favor a trial of medical expulsion therapy over ureteral stent placement/ureteroscopy stone retrieval. I will leave the medical management of his comorbidities to the hospitalist service and appreciate their input.  I will continue to follow along with the patient while he is an inpatient. At the time of discharge please have the patient contact [facility] in separate appointment with me in one week. The phone number is [phone].

## 2013-08-22 NOTE — ED Notes (Signed)
Patient transported to X-ray 

## 2013-08-22 NOTE — ED Notes (Signed)
CT notified that pt is finished with contrast.  

## 2013-08-22 NOTE — H&P (Signed)
PCP:  Georgann Housekeeper, MD    Chief Complaint:  Confusion, nausea and voming  HPI: Marvin Bishop is a 79 y.o. male   has a past medical history of Arthritis; Diabetes mellitus; Hypertension; Hypercholesteremia; Insomnia; Carpal tunnel syndrome; and Diverticulosis.   Presented with  Patient just started on Aricept yesterday, for mild dementia. Earlier today he had a bowel movement and after that started to feel ill, was noted to be diaphoretic with frequent nausea and vomiting family not sure if he had a fever or not. Denies any chest pain. Family noted he was yelling  Out but unsure why. After repeated vomiting he started to get severely confused and had trouble ambulating.  Patient is diabetic. NO sick contacts. Reports intermittent back pain for the past few months. In ER CT scan was significant for 7-10 mm right UVJ stone. His labs were significant for lactic acid of 5 but CT scan did not show any evidence of ischemia.  Since getting to ER he received IVF and drastically improved. Urology have seen him but at this point do not think that there is any indication for procedure. Hospitalist called for admission   Review of Systems:    Pertinent positives include: diaphoretic,  nausea, vomiting,   Constitutional:  No weight loss, night sweats, Fevers, chills, fatigue, weight loss  HEENT:  No headaches, Difficulty swallowing,Tooth/dental problems,Sore throat,  No sneezing, itching, ear ache, nasal congestion, post nasal drip,  Cardio-vascular:  No chest pain, Orthopnea, PND, anasarca, dizziness, palpitations.no Bilateral lower extremity swelling  GI:  No heartburn, indigestion, abdominal pain, diarrhea, change in bowel habits, loss of appetite, melena, blood in stool, hematemesis Resp:  no shortness of breath at rest. No dyspnea on exertion, No excess mucus, no productive cough, No non-productive cough, No coughing up of blood.No change in color of mucus.No wheezing. Skin:  no rash or  lesions. No jaundice GU:  no dysuria, change in color of urine, no urgency or frequency. No straining to urinate.  No flank pain.  Musculoskeletal:  No joint pain or no joint swelling. No decreased range of motion. No back pain.  Psych:  No change in mood or affect. No depression or anxiety. No memory loss.  Neuro: no localizing neurological complaints, no tingling, no weakness, no double vision, no gait abnormality, no slurred speech, no confusion  Otherwise ROS are negative except for above, 10 systems were reviewed  Past Medical History: Past Medical History  Diagnosis Date  . Arthritis   . Diabetes mellitus   . Hypertension   . Hypercholesteremia   . Insomnia   . Carpal tunnel syndrome   . Diverticulosis    Past Surgical History  Procedure Laterality Date  . Tonsillectomy    . Joint replacement    . Appendectomy    . Pars plana vitrectomy  10/24/2011    Procedure: PARS PLANA VITRECTOMY WITH 23 GAUGE;  Surgeon: Shade Flood, MD;  Location: [facility] OR;  Service: Ophthalmology;  Laterality: Right;     Medications: Prior to Admission medications   Medication Sig Start Date End Date Taking? Authorizing Provider  aspirin EC 81 MG tablet Take 81 mg by mouth daily.   Yes Historical Provider, MD  donepezil (ARICEPT) 10 MG tablet Take 1 tablet (10 mg total) by mouth at bedtime. 04/23/13  Yes Levert Feinstein, MD  dorzolamide-timolol (COSOPT) 22.3-6.8 MG/ML ophthalmic solution Place 1 drop into the right eye 2 (two) times daily.  02/05/13  Yes Historical Provider, MD  furosemide (LASIX) 20 MG  tablet Take 20 mg by mouth daily.    Yes Historical Provider, MD  Ginkgo Biloba (GINKGO PO) Take 2 capsules by mouth at bedtime. Gingko Smart Vitamin softgels   Yes Historical Provider, MD  glipiZIDE (GLUCOTROL) 10 MG tablet Take 10 mg by mouth 2 (two) times daily before a meal.    Yes Historical Provider, MD  HYDROcodone-acetaminophen (NORCO/VICODIN) 5-325 MG per tablet Take 1 tablet by mouth every 8 (eight)  hours as needed for moderate pain.   Yes Historical Provider, MD  latanoprost (XALATAN) 0.005 % ophthalmic solution Place 1 drop into the right eye at bedtime.  02/05/13  Yes Historical Provider, MD  lisinopril (PRINIVIL,ZESTRIL) 2.5 MG tablet Take 2.5 mg by mouth daily.    Yes Historical Provider, MD  metFORMIN (GLUCOPHAGE) 500 MG tablet Take 500 mg by mouth 2 (two) times daily with a meal.     Yes Historical Provider, MD  Multiple Vitamin (MULTIVITAMIN WITH MINERALS) TABS tablet Take 1 tablet by mouth daily.   Yes Historical Provider, MD  pioglitazone (ACTOS) 45 MG tablet Take 45 mg by mouth daily.    Yes Historical Provider, MD  prednisoLONE acetate (PRED FORTE) 1 % ophthalmic suspension Place 1 drop into the right eye daily.    Yes Historical Provider, MD  simvastatin (ZOCOR) 40 MG tablet Take 40 mg by mouth daily.    Yes Historical Provider, MD    Allergies:  No Known Allergies  Social History:  Ambulatory  independently walker cane Lives at   Home with family   reports that he has quit smoking. His smoking use included Cigarettes. He smoked 0.00 packs per day. He has never used smokeless tobacco. He reports that he drinks about 0.6 ounces of alcohol per week. He reports that he does not use illicit drugs.   Family History: family history includes Cancer in his brother, brother, and mother; Colon cancer in his brother; Heart Problems in his father.    Physical Exam: Patient Vitals for the past 24 hrs:  BP Temp Temp src Pulse Resp SpO2  08/22/13 2015 125/63 mmHg - - 78 15 100 %  08/22/13 1915 118/76 mmHg - - 69 - 100 %  08/22/13 1739 138/69 mmHg 97.9 F (36.6 C) Oral 68 20 98 %  08/22/13 1519 142/57 mmHg 97.5 F (36.4 C) Rectal 63 22 100 %    1. General:  in No Acute distress 2. Psychological: Alert and  Oriented to self 3. Head/ENT:   Moist  Mucous Membranes                          Head Non traumatic, neck supple                            Poor Dentition 4. SKIN:   decreased Skin turgor,  Skin clean Dry and intact no rash 5. Heart: Regular rate and rhythm no Murmur, Rub or gallop 6. Lungs: Clear to auscultation bilaterally, no wheezes or crackles   7. Abdomen: Soft, non-tender, Non distended 8. Lower extremities: no clubbing, cyanosis, or edema 9. Neurologically Grossly intact, moving all 4 extremities equally 10. MSK: Normal range of motion  body mass index is unknown because there is no weight on file.   Labs on Admission:   Recent Labs  08/22/13 1543  NA 142  K 2.8*  CL 103  CO2 21  GLUCOSE 196*  BUN 16  CREATININE  1.30  CALCIUM 9.7    Recent Labs  08/22/13 1543  AST 18  ALT 10  ALKPHOS 68  BILITOT 0.7  PROT 7.3  ALBUMIN 3.8    Recent Labs  08/22/13 1543  LIPASE 25    Recent Labs  08/22/13 1543  WBC 12.3*  NEUTROABS 9.2*  HGB 12.7*  HCT 36.7*  MCV 82.8  PLT 237    Recent Labs  08/22/13 1543  TROPONINI <0.30   No results found for this basename: TSH, T4TOTAL, FREET3, T3FREE, THYROIDAB,  in the last 72 hours No results found for this basename: VITAMINB12, FOLATE, FERRITIN, TIBC, IRON, RETICCTPCT,  in the last 72 hours No results found for this basename: HGBA1C    The CrCl is unknown because both a height and weight (above a minimum accepted value) are required for this calculation. ABG    Component Value Date/Time   TCO2 25 03/20/2008 2311     No results found for this basename: DDIMER     Other results:  I have pearsonaly reviewed this: ECG REPORT  Rate: 65  Rhythm: NSR ST&T Change: no ischemic changes  UA no evidence of UTI  Cultures: No results found for this basename: sdes, specrequest, cult, reptstatus       Radiological Exams on Admission: Ct Abdomen Pelvis W Contrast  08/22/2013   CLINICAL DATA:  Generalized abdominal pain and vomiting  EXAM: CT ABDOMEN AND PELVIS WITH CONTRAST  TECHNIQUE: Multidetector CT imaging of the abdomen and pelvis was performed using the standard protocol  following bolus administration of intravenous contrast.  CONTRAST:  OMNIPAQUE IOHEXOL 300 MG/ML  SOLN  COMPARISON:  02/10/2010  FINDINGS: The stress set scar is identified in the posterior right lung base. No pleural or pericardial effusion. Hemangioma in the left hepatic lobe is again identified. Gallbladder is normal. No biliary dilatation. Normal appearance of the pancreas. The spleen appears normal.  Normal appearance of the adrenal glands. Right kidney is normal. Several cysts are noted within the left kidney. There is no hydronephrosis or hydroureter. In the distal right ureter at the UVJ there is a stone measuring 10 x 7 mm, image 77/series 2. Urinary bladder is collapsed around a Foley catheter balloon. Seed implants are noted within the prostate gland.  There is calcified atherosclerotic disease affecting the abdominal aorta. No aneurysm. There is no upper abdominal adenopathy. No pelvic or inguinal adenopathy noted.  There is no free fluid or abnormal fluid collections identified within the abdomen or pelvis. The stomach appears normal. The small bowel loops are normal in caliber. Normal appearance of the proximal colon. There are scattered distal colonic diverticula identified.  Review of the visualized osseous structures is significant for lumbar degenerative disc disease.  IMPRESSION: 1. There is a large stone within the distal right ureter at the level of the UVJ. This measures approximately 7 x 10 mm.  2. Liver hemangioma.  3. Calcified atherosclerotic disease.   Electronically Signed   By: Signa Kell M.D.   On: 08/22/2013 20:10   Dg Abd Acute W/chest  08/22/2013   CLINICAL DATA:  Abdominal pain  EXAM: ACUTE ABDOMEN SERIES (ABDOMEN 2 VIEW & CHEST 1 VIEW)  COMPARISON:  Chest radiographs dated 10/24/2011  FINDINGS: Lungs are clear. No pleural effusion or pneumothorax.  The heart is top-normal in size.  Nonobstructive bowel gas pattern.  No evidence of free air on the lateral decubitus view.   Degenerative changes of the visualized thoracolumbar spine.  IMPRESSION: No evidence of  acute cardiopulmonary disease.  No evidence of small bowel obstruction or free air.   Electronically Signed   By: Charline Bills M.D.   On: 08/22/2013 17:44    Chart has been reviewed  Assessment/Plan  78 yo M w hx of DM and HTN here after bout of nausea and vomiting after taking Aricept he also incidently was found to have Right UVJ stone but no evidence of hydronephrosis or UTI.    Present on Admission:  Nausea and vomiting of unclear etiology now improved possibly due to Aricept. Given hx of Dm and advance age will admit and cycle CE to evaluate for sielent ischemia.  . Renal calculus or stone - Urology have seen patient at this point conservative management. Will need follow up imaging in 1 week to see if he has passed it.  . Hypokalemia - will replace . Hypertension - continue home meds . Diabetes mellitus - hold PO meds, SSI . Dehydration - IVF hold lasix Elevated lactic acid now resolved- possibly was due to acute dehydraton   Prophylaxis:  Lovenox   CODE STATUS: FULL CODE  Other plan as per orders.  I have spent a total of 60 min on this admission, spoke to Urology. Dr. Donette Larry to take over care in AM  [facility] 08/22/2013, 9:37 PM

## 2013-08-22 NOTE — ED Provider Notes (Signed)
CSN: [id number]     Arrival date & time 08/22/13  1511 History   First MD Initiated Contact with Patient 08/22/13 1516     Chief Complaint  Patient presents with  . Abdominal Pain  . Emesis   (Consider location/radiation/quality/duration/timing/severity/associated sxs/prior Treatment) HPI Comments: 79 yo AA male w/ PMH of DM, HTN, h/o prostate CA, h/w Diverticulosis presents with cc of abd pain, n/v, fatigue and generalized feeling of being unwell.  Pt t/s to ER by EMS. VSS.  Given Zofran for n/v.  Hx obtatined from pt, EMS, and daughter.    Pt reports feeling poorly for past two days.  Pt is generally very healthy and active male.  He raked leaves yesterday.      Patient is a 79 y.o. male presenting with abdominal pain. The history is provided by the patient, the EMS personnel and a relative.  Abdominal Pain Pain location:  Generalized Pain quality: aching and bloating   Pain quality: not burning, not cramping, not dull, no fullness, not gnawing, not heavy, no pressure, not sharp, not shooting, not squeezing, not stabbing, not tearing, not throbbing and not tugging   Pain radiates to:  RLQ and LLQ Pain severity:  Moderate Onset quality:  Gradual Duration:  2 days Timing:  Constant Progression:  Worsening Chronicity:  New Context: not alcohol use, not awakening from sleep, not diet changes, not eating, not laxative use, not medication withdrawal, not previous surgeries, not recent illness, not recent sexual activity, not recent travel, not retching, not sick contacts, not suspicious food intake and not trauma   Worsened by:  Nothing tried Ineffective treatments:  None tried Associated symptoms: fatigue, nausea and vomiting   Associated symptoms: no anorexia, no belching, no chills, no constipation, no cough, no diarrhea, no fever, no flatus, no hematemesis, no shortness of breath and no sore throat   Fatigue:    Severity:  Moderate   Duration:  2 days   Timing:  Constant    Progression:  Worsening Vomiting:    Quality:  Stomach contents   Number of occurrences:  2   Severity:  Moderate   Duration:  2 days Risk factors: being elderly   Risk factors: no alcohol abuse, no aspirin use, has not had multiple surgeries, no NSAID use, not obese, not pregnant and no recent hospitalization     Past Medical History  Diagnosis Date  . Arthritis   . Diabetes mellitus   . Hypertension   . Hypercholesteremia   . Insomnia   . Carpal tunnel syndrome   . Diverticulosis    Past Surgical History  Procedure Laterality Date  . Tonsillectomy    . Joint replacement    . Appendectomy    . Pars plana vitrectomy  10/24/2011    Procedure: PARS PLANA VITRECTOMY WITH 23 GAUGE;  Surgeon: Shade Flood, MD;  Location: [facility] OR;  Service: Ophthalmology;  Laterality: Right;   Family History  Problem Relation Age of Onset  . Cancer Mother     uterus  . Heart Problems Father   . Cancer Brother     stomach  . Cancer Brother     throat  . Colon cancer Brother    History  Substance Use Topics  . Smoking status: Former Smoker    Types: Cigarettes  . Smokeless tobacco: Never Used     Comment: Quit more than 30 years ago.  . Alcohol Use: 0.6 oz/week    1 Shots of liquor per week  Comment: occasional    Review of Systems  Constitutional: Positive for fatigue. Negative for fever and chills.  HENT: Negative for sore throat.   Respiratory: Negative for cough and shortness of breath.   Gastrointestinal: Positive for nausea, vomiting and abdominal pain. Negative for diarrhea, constipation, anorexia, flatus and hematemesis.    Allergies  Review of patient's allergies indicates no known allergies.  Home Medications   Current Outpatient Rx  Name  Route  Sig  Dispense  Refill  . aspirin EC 81 MG tablet   Oral   Take 81 mg by mouth daily.         Marland Kitchen donepezil (ARICEPT) 10 MG tablet   Oral   Take 1 tablet (10 mg total) by mouth at bedtime.   30 tablet   12   .  dorzolamide-timolol (COSOPT) 22.3-6.8 MG/ML ophthalmic solution   Right Eye   Place 1 drop into the right eye 2 (two) times daily.          . furosemide (LASIX) 20 MG tablet   Oral   Take 20 mg by mouth daily.          . Ginkgo Biloba (GINKGO PO)   Oral   Take 2 capsules by mouth at bedtime. Gingko Smart Vitamin softgels         . glipiZIDE (GLUCOTROL) 10 MG tablet   Oral   Take 10 mg by mouth 2 (two) times daily before a meal.          . HYDROcodone-acetaminophen (NORCO/VICODIN) 5-325 MG per tablet   Oral   Take 1 tablet by mouth every 8 (eight) hours as needed for moderate pain.         Marland Kitchen latanoprost (XALATAN) 0.005 % ophthalmic solution   Right Eye   Place 1 drop into the right eye at bedtime.          Marland Kitchen lisinopril (PRINIVIL,ZESTRIL) 2.5 MG tablet   Oral   Take 2.5 mg by mouth daily.          . metFORMIN (GLUCOPHAGE) 500 MG tablet   Oral   Take 500 mg by mouth 2 (two) times daily with a meal.           . Multiple Vitamin (MULTIVITAMIN WITH MINERALS) TABS tablet   Oral   Take 1 tablet by mouth daily.         . pioglitazone (ACTOS) 45 MG tablet   Oral   Take 45 mg by mouth daily.          . prednisoLONE acetate (PRED FORTE) 1 % ophthalmic suspension   Right Eye   Place 1 drop into the right eye daily.          . simvastatin (ZOCOR) 40 MG tablet   Oral   Take 40 mg by mouth daily.           BP 138/69  Pulse 68  Temp(Src) 97.9 F (36.6 C) (Oral)  Resp 20  SpO2 98% Physical Exam  Nursing note and vitals reviewed. Constitutional: He is oriented to person, place, and time. He appears well-developed. He appears distressed.  HENT:  Head: Normocephalic and atraumatic.  Mouth/Throat: Oropharynx is clear and moist. No oropharyngeal exudate.  Eyes: Conjunctivae are normal. Right eye exhibits no discharge. Left eye exhibits no discharge.  Neck: Normal range of motion. Neck supple. No JVD present.  Cardiovascular: Normal rate and regular  rhythm.  Exam reveals no gallop and no friction rub.   No  murmur heard. Pulmonary/Chest: Effort normal and breath sounds normal. No stridor. No respiratory distress. He has no wheezes. He has no rales. He exhibits no tenderness.  Abdominal: Soft. Bowel sounds are normal. He exhibits distension. He exhibits no mass. There is tenderness. There is no rebound and no guarding.  Diffuse TTP  Genitourinary: Penis normal. No penile tenderness.  Musculoskeletal: Normal range of motion. He exhibits edema. He exhibits no tenderness.  Neurological: He is alert and oriented to person, place, and time. He has normal reflexes.  Skin: Skin is warm. No rash noted. He is diaphoretic. No erythema.    ED Course  Procedures (including critical care time) Labs Review Labs Reviewed  CBC WITH DIFFERENTIAL - Abnormal; Notable for the following:    WBC 12.3 (*)    Hemoglobin 12.7 (*)    HCT 36.7 (*)    Neutro Abs 9.2 (*)    All other components within normal limits  COMPREHENSIVE METABOLIC PANEL - Abnormal; Notable for the following:    Potassium 2.8 (*)    Glucose, Bld 196 (*)    GFR calc non Af Amer 46 (*)    GFR calc Af Amer 54 (*)    All other components within normal limits  URINALYSIS, ROUTINE W REFLEX MICROSCOPIC - Abnormal; Notable for the following:    APPearance CLOUDY (*)    Hgb urine dipstick TRACE (*)    All other components within normal limits  LACTIC ACID, PLASMA - Abnormal; Notable for the following:    Lactic Acid, Venous 5.4 (*)    All other components within normal limits  URINE MICROSCOPIC-ADD ON - Abnormal; Notable for the following:    Casts HYALINE CASTS (*)    All other components within normal limits  LIPASE, BLOOD  TROPONIN I  OCCULT BLOOD GASTRIC / DUODENUM (SPECIMEN CUP)  POCT I-STAT TROPONIN I   Imaging Review Dg Abd Acute W/chest  08/22/2013   CLINICAL DATA:  Abdominal pain  EXAM: ACUTE ABDOMEN SERIES (ABDOMEN 2 VIEW & CHEST 1 VIEW)  COMPARISON:  Chest radiographs  dated 10/24/2011  FINDINGS: Lungs are clear. No pleural effusion or pneumothorax.  The heart is top-normal in size.  Nonobstructive bowel gas pattern.  No evidence of free air on the lateral decubitus view.  Degenerative changes of the visualized thoracolumbar spine.  IMPRESSION: No evidence of acute cardiopulmonary disease.  No evidence of small bowel obstruction or free air.   Electronically Signed   By: Charline Bills M.D.   On: 08/22/2013 17:44    EKG Interpretation     Ventricular Rate:  65 PR Interval:  217 QRS Duration: 127 QT Interval:  467 QTC Calculation: 486 R Axis:   20 Text Interpretation:  Sinus rhythm Prolonged PR interval Right bundle branch block Baseline wander in lead(s) II III aVL aVF V2 V3 V5 Since last tracing No significant change since           Date: 08/22/2013 EKG # 2  Rate: 65  Rhythm: normal sinus rhythm  QRS Axis: normal  Intervals: PR prolonged  ST/T Wave abnormalities: nonspecific ST changes  Conduction Disutrbances:right bundle branch block  Narrative Interpretation:   Old EKG Reviewed: changes noted  Results for orders placed during the hospital encounter of 08/22/13  CBC WITH DIFFERENTIAL      Result Value Range   WBC 12.3 (*) 4.0 - 10.5 K/uL   RBC 4.43  4.22 - 5.81 MIL/uL   Hemoglobin 12.7 (*) 13.0 - 17.0 g/dL   HCT  36.7 (*) 39.0 - 52.0 %   MCV 82.8  78.0 - 100.0 fL   MCH 28.7  26.0 - 34.0 pg   MCHC 34.6  30.0 - 36.0 g/dL   RDW 16.1  09.6 - 04.5 %   Platelets 237  150 - 400 K/uL   Neutrophils Relative % 75  43 - 77 %   Neutro Abs 9.2 (*) 1.7 - 7.7 K/uL   Lymphocytes Relative 20  12 - 46 %   Lymphs Abs 2.4  0.7 - 4.0 K/uL   Monocytes Relative 5  3 - 12 %   Monocytes Absolute 0.6  0.1 - 1.0 K/uL   Eosinophils Relative 1  0 - 5 %   Eosinophils Absolute 0.1  0.0 - 0.7 K/uL   Basophils Relative 0  0 - 1 %   Basophils Absolute 0.0  0.0 - 0.1 K/uL  COMPREHENSIVE METABOLIC PANEL      Result Value Range   Sodium 142  135 - 145 mEq/L    Potassium 2.8 (*) 3.5 - 5.1 mEq/L   Chloride 103  96 - 112 mEq/L   CO2 21  19 - 32 mEq/L   Glucose, Bld 196 (*) 70 - 99 mg/dL   BUN 16  6 - 23 mg/dL   Creatinine, Ser 4.09  0.50 - 1.35 mg/dL   Calcium 9.7  8.4 - 81.1 mg/dL   Total Protein 7.3  6.0 - 8.3 g/dL   Albumin 3.8  3.5 - 5.2 g/dL   AST 18  0 - 37 U/L   ALT 10  0 - 53 U/L   Alkaline Phosphatase 68  39 - 117 U/L   Total Bilirubin 0.7  0.3 - 1.2 mg/dL   GFR calc non Af Amer 46 (*) >90 mL/min   GFR calc Af Amer 54 (*) >90 mL/min  LIPASE, BLOOD      Result Value Range   Lipase 25  11 - 59 U/L  URINALYSIS, ROUTINE W REFLEX MICROSCOPIC      Result Value Range   Color, Urine YELLOW  YELLOW   APPearance CLOUDY (*) CLEAR   Specific Gravity, Urine 1.013  1.005 - 1.030   pH 5.0  5.0 - 8.0   Glucose, UA NEGATIVE  NEGATIVE mg/dL   Hgb urine dipstick TRACE (*) NEGATIVE   Bilirubin Urine NEGATIVE  NEGATIVE   Ketones, ur NEGATIVE  NEGATIVE mg/dL   Protein, ur NEGATIVE  NEGATIVE mg/dL   Urobilinogen, UA 0.2  0.0 - 1.0 mg/dL   Nitrite NEGATIVE  NEGATIVE   Leukocytes, UA NEGATIVE  NEGATIVE  LACTIC ACID, PLASMA      Result Value Range   Lactic Acid, Venous 5.4 (*) 0.5 - 2.2 mmol/L  TROPONIN I      Result Value Range   Troponin I <0.30  <0.30 ng/mL  URINE MICROSCOPIC-ADD ON      Result Value Range   Squamous Epithelial / LPF RARE  RARE   WBC, UA 0-2  <3 WBC/hpf   RBC / HPF 0-2  <3 RBC/hpf   Bacteria, UA RARE  RARE   Casts HYALINE CASTS (*) NEGATIVE   Urine-Other AMORPHOUS URATES/PHOSPHATES    POCT I-STAT TROPONIN I      Result Value Range   Troponin i, poc 0.00  0.00 - 0.08 ng/mL   Comment 3               MDM   1. Vomiting   2. Abdominal pain   3.  Lactic acidosis   4. Hypokalemia    79 yo AA male with HTN, DM, h/o Diverticulitis, presents with ABD pain, n/v and malaise.  VSS on arrival.  STAT EKG and troponin ordered.  Will obtain AAS to assess for obst or perf and obtain labs.    Prolonged ER stay awaiting CT  scan.  Pt with lactic acidosis and hypokalemia on labs.  BG 196.  Will replete potassium.  7:14 PM Awaiting CT abdomen pelvis. Vital signs stable. CT with large stone at R UVJ.  Will c/s Int medicine and Urology.  For eval and admission.    8:47 PM D/w Urology Dr. Marlou Porch who will see in ER.  D/w Int medicine Dr. Adela Glimpse who will admit.  Plan for IVF NS and recheck lactate.   Pt and Family updated.    Repeat Lactate improved at 2.1.  VSS.  Admit Medicine.  The patient appears reasonably stabilized for admission considering the current resources, flow, and capabilities available in the ED at this time, and I doubt any other [facility] requiring further screening and/or treatment in the ED prior to admission.  Darlys Gales, MD 08/23/13 [phone]

## 2013-08-22 NOTE — ED Notes (Signed)
Patient currently in CT at this time °

## 2013-08-22 NOTE — ED Notes (Signed)
Pt in via EMS, per EMS- called to home by family due to patient not feeling well since last night, states they were concerned about his diabetes but CBGs at home hadn't been abnormal for patient, this morning patient developed abd pain, en route to hospital pt was given zofran and vomited, pt continues to c/o generalized abd pain upon arrival, abdomen tender to palpation, family states abdomen appears distended, patient unsure of last BM date. Pt alert and oriented. IV started PTA, 18g in LAC

## 2013-08-23 LAB — CREATININE, SERUM
Creatinine, Ser: 1.26 mg/dL (ref 0.50–1.35)
GFR calc Af Amer: 56 mL/min — ABNORMAL LOW (ref >90)
GFR calc non Af Amer: 48 mL/min — ABNORMAL LOW (ref >90)

## 2013-08-23 LAB — MAGNESIUM: Magnesium: 1.5 mg/dL (ref 1.5–2.5)

## 2013-08-23 LAB — GLUCOSE, CAPILLARY: Glucose-Capillary: 153 mg/dL — ABNORMAL HIGH (ref 70–99)

## 2013-08-23 LAB — CBC
HCT: 32.8 % — ABNORMAL LOW (ref 39.0–52.0)
MCH: 28.5 pg (ref 26.0–34.0)
MCH: 28.6 pg (ref 26.0–34.0)
MCHC: 34.1 g/dL (ref 30.0–36.0)
Platelets: 193 10*3/uL (ref 150–400)
Platelets: 199 10*3/uL (ref 150–400)
RBC: 4.12 MIL/uL — ABNORMAL LOW (ref 4.22–5.81)
RDW: 14.7 % (ref 11.5–15.5)
RDW: 15 % (ref 11.5–15.5)
WBC: 7.5 10*3/uL (ref 4.0–10.5)
WBC: 7.7 10*3/uL (ref 4.0–10.5)

## 2013-08-23 LAB — COMPREHENSIVE METABOLIC PANEL
ALT: 9 U/L (ref 0–53)
Alkaline Phosphatase: 60 U/L (ref 39–117)
BUN: 15 mg/dL (ref 6–23)
CO2: 25 mEq/L (ref 19–32)
Calcium: 8.6 mg/dL (ref 8.4–10.5)
GFR calc Af Amer: 53 mL/min — ABNORMAL LOW (ref >90)
GFR calc non Af Amer: 46 mL/min — ABNORMAL LOW (ref >90)
Glucose, Bld: 111 mg/dL — ABNORMAL HIGH (ref 70–99)
Potassium: 4.1 mEq/L (ref 3.5–5.1)
Sodium: 141 mEq/L (ref 135–145)

## 2013-08-23 LAB — PHOSPHORUS: Phosphorus: 2 mg/dL — ABNORMAL LOW (ref 2.3–4.6)

## 2013-08-23 LAB — TROPONIN I: Troponin I: <0.3 ng/mL (ref <0.30)

## 2013-08-23 LAB — HEMOGLOBIN A1C
Hgb A1c MFr Bld: 6.7 % — ABNORMAL HIGH (ref <5.7)
Mean Plasma Glucose: 146 mg/dL — ABNORMAL HIGH (ref <117)

## 2013-08-23 NOTE — Discharge Summary (Signed)
Physician Discharge Summary  Patient ID: Marvin Bishop MRN: [id number] DOB/AGE: [birth year] 79 y.o.  Admit date: 08/22/2013 Discharge date: 08/23/2013  Admission Diagnoses: Nausea and vomiting Renal calculus Hypokalemia Hypertension Diabetes mellitus Dementia  Discharge Diagnoses:  Active Problems: Nausea and vomiting   Renal calculus or stone   Hypokalemia   Hypertension   Diabetes mellitus Dementia   Discharged Condition: good  Hospital Course: The patient was admitted on November 8. Early in the day he developed frequent nausea and vomiting after a bowel movement. He developed some confusion and had trouble walking. He was taken to the where a CT scan of the abdomen showed a 7-10 mm right UVJ stone. He had elevated lactic acid level of 5 but no evidence of bowel ischemia. He was given IV fluids and had significant improvement in the ER. He was seen by urology who noted that he did not have any evidence of hydronephrosis therefore a nonobstructing distal right UVJ stone with her ureteral thickening and reaction suggesting impaction and chronicity. His urinalysis was negative. Urology recommended medical lotion therapy over stent placement. The patient was tolerating a diet without any symptoms in the morning of discharge. He received a IV contrast study and metformin will be held for 48 hours restarting on Monday p.m.  Consults: urology  Significant Diagnostic Studies: labs: At discharge sodium 141 potassium 4.1 chloride 1 another car and 25 BUN 15 creatinine 1.31. Repeat lactic acid 2.1 all troponin I levels normal.. and radiology: CT scan: Large stone distal right ureter level of UVJ, liver hemangioma, calcified or atherosclerotic disease  Treatments: IV hydration  Discharge Exam: Blood pressure 115/71, pulse 71, temperature 99.2 F (37.3 C), temperature source Oral, resp. rate 16, height 6' 1.5" (1.867 m), weight 100.29 kg (221 lb 1.6 oz), SpO2 100.00%. Resp: clear to  auscultation bilaterally Cardio: regular rate and rhythm, S1, S2 normal, no murmur, click, rub or gallop  Disposition: 01-Home or Self Care   Future Appointments Provider Department Dept Phone   11/12/2013 1:45 PM Lenn Sink, DPM [facility] [phone]   04/26/2014 3:30 PM Nilda Riggs, NP [facility] [phone]       Medication List    STOP taking these medications       donepezil 10 MG tablet  Commonly known as:  ARICEPT      TAKE these medications       aspirin EC 81 MG tablet  Take 81 mg by mouth daily.     dorzolamide-timolol 22.3-6.8 MG/ML ophthalmic solution  Commonly known as:  COSOPT  Place 1 drop into the right eye 2 (two) times daily.     furosemide 20 MG tablet  Commonly known as:  LASIX  Take 20 mg by mouth daily.     GINKGO PO  Take 2 capsules by mouth at bedtime. Gingko Smart Vitamin softgels     glipiZIDE 10 MG tablet  Commonly known as:  GLUCOTROL  Take 10 mg by mouth 2 (two) times daily before a meal.     HYDROcodone-acetaminophen 5-325 MG per tablet  Commonly known as:  NORCO/VICODIN  Take 1 tablet by mouth every 8 (eight) hours as needed for moderate pain.     latanoprost 0.005 % ophthalmic solution  Commonly known as:  XALATAN  Place 1 drop into the right eye at bedtime.     lisinopril 2.5 MG tablet  Commonly known as:  PRINIVIL,ZESTRIL  Take 2.5 mg by mouth daily.  metFORMIN 500 MG tablet  Commonly known as:  GLUCOPHAGE  Take 500 mg by mouth 2 (two) times daily with a meal.     multivitamin with minerals Tabs tablet  Take 1 tablet by mouth daily.     pioglitazone 45 MG tablet  Commonly known as:  ACTOS  Take 45 mg by mouth daily.     prednisoLONE acetate 1 % ophthalmic suspension  Commonly known as:  PRED FORTE  Place 1 drop into the right eye daily.     simvastatin 40 MG tablet  Commonly known as:  ZOCOR  Take 40 mg by mouth daily.           Follow-up Information    Follow up In 1 week.      Follow up with Georgann Housekeeper, MD In 1 week.   Specialty:  Internal Medicine   Contact information:   [address] [place] [address] [phone]       Signed: Lillia Mountain 08/23/2013, 10:06 AM

## 2013-08-24 ENCOUNTER — Other Ambulatory Visit: Payer: Self-pay | Admitting: Neurology

## 2013-08-24 MED ORDER — DONEPEZIL HCL 10 MG PO TABS: 10.0000 mg | ORAL_TABLET | Freq: Every day | ORAL | Status: DC

## 2013-08-24 NOTE — Progress Notes (Signed)
Subjective:     Patient ID: Marvin Bishop, male   DOB: [birth year], 79 y.o.   MRN: [id number]  HPI patient presents with nail disease and pain 1-5 both feet   Review of Systems     Objective:   Physical Exam  Nursing note and vitals reviewed. Constitutional: He is oriented to person, place, and time.  Cardiovascular: Intact distal pulses.   Neurological: He is oriented to person, place, and time.  Skin: Skin is warm.   nail thickness with pain 1-5 both feet with debris of the nailbeds noted     Assessment:     Mycotic nail infection 1-5 both feet with pain    Plan:     Debridement painful nailbeds 1-5 both feet with no iatrogenic bleeding noted

## 2013-08-25 ENCOUNTER — Telehealth: Payer: Self-pay | Admitting: Neurology

## 2013-08-25 NOTE — Telephone Encounter (Signed)
Pt's prescription was faxed over to [facility] at [phone].

## 2013-08-25 NOTE — Telephone Encounter (Signed)
Call patient and spoke with patient's daughter [name]. Ms. Marvin Bishop stated that the patient needs to come in sooner to see Dr. Terrace Arabia because the medication aricept made him sick and patient had to go to the hospital, please advise.

## 2013-08-28 ENCOUNTER — Encounter: Payer: Self-pay | Admitting: Nurse Practitioner

## 2013-09-02 ENCOUNTER — Ambulatory Visit (INDEPENDENT_AMBULATORY_CARE_PROVIDER_SITE_OTHER): Payer: Medicare PPO | Admitting: Nurse Practitioner

## 2013-09-02 ENCOUNTER — Encounter: Payer: Self-pay | Admitting: Nurse Practitioner

## 2013-09-02 VITALS — BP 117/69 | HR 88 | Temp 97.8°F | Ht 70.0 in | Wt 218.0 lb

## 2013-09-02 DIAGNOSIS — R413 Other amnesia: Secondary | ICD-10-CM

## 2013-09-02 DIAGNOSIS — F0390 Unspecified dementia without behavioral disturbance: Secondary | ICD-10-CM

## 2013-09-02 NOTE — Progress Notes (Signed)
[facility]  Marvin Bishop: Marvin Bishop DOB: 12/04/1919   REASON FOR VISIT: Followup for memory loss  HISTORY OF PRESENT ILLNESS: Marvin Bishop, 79 year old returns for followup. Marvin Bishop was just seen by Dr. Terrace Arabia 08/04/2013 and started on Aricept 10 mg several weeks after that. The next day Marvin Bishop had admission to the emergency room for vomiting and found to have a renal stone. The daughter thought this was due to Marvin Bishop Aricept and stopped the medication. Now they realize it may have been a renal stone and  want to restart the medication. Mini-Mental Status exam has declined over the last month. No new neurologic complaints.  HISTORY: Marvin Bishop, accompanied by her daughter, referred by her primary care physician Dr. Tyson Dense for evaluation of memory trouble  Marvin Bishop had a past medical history of hypertension, diabetes, hyperlipidemia, prostate cancer, had a history of a left knee reconstruction surgery  Marvin Bishop had sixth-years of education, is a retired Arboriculturist in 2007, Marvin Bishop lives alone, still driving short distance, over the past one year, Marvin Bishop daughter noticed that Marvin Bishop has become forgetful, Marvin Bishop forget to take Marvin Bishop medications, Marvin Bishop misplace things, Marvin Bishop often gets out of Marvin Bishop car, forgot to close Marvin Bishop car door, Marvin Bishop also has mild gait difficulty, using a cane.  Marvin Bishop denies sleep difficulty, has mild gait difficulty, Marvin Bishop denies bowel and bladder incontinence, Marvin Bishop can still pay Marvin Bishop own bill, cooking simple meals without significant difficulty. Marvin Bishop still lives by himself, Marvin Bishop still drives short distance, has not got lost yet.  UPDATE Aug 04 2013:  MRI showed mild changes of chronic microvascular ischemia and generalized cerebral atrophy.  Lab Jan 2014, normal CBX, CMP, A1c 6.1, LDL 100, TSH.  Marvin Bishop is taking over counter MVI, Marvin Bishop has tried Aricept, but has lost appetite, daughter does not want him to try any medications recently  REVIEW OF SYSTEMS: Full 14 system review of systems performed and  notable only for:  Constitutional: N/A  Cardiovascular: N/A  Ear/Nose/Throat: N/A  Skin: N/A  Eyes: N/A  Respiratory: N/A  Gastroitestinal: N/A  Genitourinary kidney stone Hematology/Lymphatic: N/A  Endocrine: N/A Musculoskeletal:N/A  Allergy/Immunology: N/A  Neurological: Memory loss Psychiatric: N/A   ALLERGIES: No Known Allergies  HOME MEDICATIONS: Outpatient Prescriptions Prior to Visit  Medication Sig Dispense Refill  . aspirin EC 81 MG tablet Take 81 mg by mouth daily.      . cephALEXin (KEFLEX) 500 MG capsule Take 500 mg by mouth 4 (four) times daily.      . dorzolamide (TRUSOPT) 2 % ophthalmic solution Place 1 drop into both eyes 2 (two) times daily.      . dorzolamide-timolol (COSOPT) 22.3-6.8 MG/ML ophthalmic solution Place 1 drop into the right eye 2 (two) times daily.       Marland Kitchen esomeprazole (NEXIUM) 40 MG capsule Take 40 mg by mouth daily at 12 noon.      . furosemide (LASIX) 20 MG tablet Take 20 mg by mouth daily.       Marland Kitchen gabapentin (NEURONTIN) 100 MG capsule Take 200 mg by mouth daily.      . Ginkgo Biloba (GINKGO PO) Take 2 capsules by mouth at bedtime. Gingko Smart Vitamin softgels      . glipiZIDE (GLUCOTROL) 10 MG tablet Take 10 mg by mouth 2 (two) times daily before a meal.       . HYDROcodone-acetaminophen (NORCO/VICODIN) 5-325 MG per tablet Take 1 tablet by mouth every 8 (eight) hours as needed for moderate pain.      Marland Kitchen  latanoprost (XALATAN) 0.005 % ophthalmic solution Place 1 drop into the right eye at bedtime.       Marland Kitchen lisinopril (PRINIVIL,ZESTRIL) 2.5 MG tablet Take 2.5 mg by mouth daily.       Marland Kitchen LORazepam (ATIVAN) 0.5 MG tablet Take 0.5 mg by mouth every 8 (eight) hours.      . meloxicam (MOBIC) 7.5 MG tablet Take 7.5 mg by mouth daily.      . metFORMIN (GLUCOPHAGE) 500 MG tablet Take 500 mg by mouth 2 (two) times daily with a meal.        . Multiple Vitamin (MULTIVITAMIN WITH MINERALS) TABS tablet Take 1 tablet by mouth daily.      . pioglitazone (ACTOS)  45 MG tablet Take 45 mg by mouth daily.       . prednisoLONE acetate (PRED FORTE) 1 % ophthalmic suspension Place 1 drop into the right eye daily.       . sildenafil (VIAGRA) 50 MG tablet Take 50 mg by mouth daily as needed for erectile dysfunction.      . simvastatin (ZOCOR) 40 MG tablet Take 40 mg by mouth daily.       . temazepam (RESTORIL) 30 MG capsule Take 30 mg by mouth at bedtime as needed for sleep.      . traMADol (ULTRAM) 50 MG tablet Take by mouth every 6 (six) hours as needed.      . donepezil (ARICEPT) 10 MG tablet Take 1 tablet (10 mg total) by mouth at bedtime.  30 tablet  3   No facility-administered medications prior to visit.    PAST MEDICAL HISTORY: Past Medical History  Diagnosis Date  . Arthritis   . Diabetes mellitus   . Hypertension   . Hypercholesteremia   . Insomnia   . Carpal tunnel syndrome   . Diverticulosis   . Nephrolithiasis     right UVJ stone  . Dementia   . Prostate cancer     PAST SURGICAL HISTORY: Past Surgical History  Procedure Laterality Date  . Tonsillectomy    . Joint replacement    . Appendectomy    . Pars plana vitrectomy  10/24/2011    Procedure: PARS PLANA VITRECTOMY WITH 23 GAUGE;  Surgeon: Shade Flood, MD;  Location: [facility] OR;  Service: Ophthalmology;  Laterality: Right;  . Knee construction Left     FAMILY HISTORY: Family History  Problem Relation Age of Onset  . Cancer Mother     uterus  . Heart Problems Father   . Cancer Brother     stomach  . Cancer Brother     throat  . Colon cancer Brother     SOCIAL HISTORY: History   Social History  . Marital Status: Widowed    Spouse Name: N/A    Number of Children: 5  . Years of Education: 6 th   Occupational History  .      Retired   Social History Main Topics  . Smoking status: Former Smoker    Types: Cigarettes  . Smokeless tobacco: Never Used     Comment: Quit more than 30 years ago.  . Alcohol Use: 0.6 oz/week    1 Shots of liquor per week     Comment:  occasional  . Drug Use: No  . Sexual Activity: Not on file   Other Topics Concern  . Not on file   Social History Narrative   Marvin Bishop lives at home alone with dog.   Retired.   Education.- 6  th  Grade   Right handed.   Caffeine-Soda , tea and coffee                 PHYSICAL EXAM  Filed Vitals:   09/02/13 1010  BP: 117/Marvin  Pulse: 88  Temp: 97.8 F (36.6 C)  TempSrc: Oral  Height: 5\' 10"  (1.778 m)  Weight: 218 lb (98.884 kg)   Body mass index is 31.28 kg/(m^2).  Generalized: Well developed, in no acute distress  Head: normocephalic and atraumatic,. Oropharynx benign   Neurological examination   Mentation: Alert oriented to place, history taking. MMSE 17/30 items in orientation, calculation, 3 of 3 recall, unable to repeat phrases, unable to copy a figure . AFT 10. Clock drawing 4/4. GDS=3Follows all commands,  speech and language fluent  Cranial nerve II-XII: Left pupil round and reactive, static right ptosis irregular pupil. Extraocular movements were full, visual field were full on confrontational test. Facial sensation and strength were normal. hearing was intact to finger rubbing bilaterally. Uvula tongue midline. head turning and shoulder shrug and were normal and symmetric.Tongue protrusion into cheek strength was normal. Motor: normal bulk and tone, full strength in the BUE, BLE, fine finger movements normal, no pronator drift. No focal weakness Coordination: finger-nose-finger, heel-to-shin bilaterally, no dysmetria Reflexes: Brachioradialis 2/2, biceps 2/2, triceps 2/2, patellar 2/2, Achilles absent,  plantar responses were flexor bilaterally. Gait and Station: Rising up from seated position without assistance, cautious unsteady gait, stooped posture   DIAGNOSTIC DATA (LABS, IMAGING, TESTING) - I reviewed Marvin Bishop records, labs, notes, testing and imaging myself where available.  Lab Results  Component Value Date   WBC 7.7 08/23/2013   HGB 11.2* 08/23/2013    HCT 32.8* 08/23/2013   MCV 83.5 08/23/2013   PLT 193 08/23/2013      Component Value Date/Time   NA 141 08/23/2013 0514   K 4.1 08/23/2013 0514   CL 109 08/23/2013 0514   CO2 25 08/23/2013 0514   GLUCOSE 111* 08/23/2013 0514   BUN 15 08/23/2013 0514   CREATININE 1.31 08/23/2013 0514   CALCIUM 8.6 08/23/2013 0514   PROT 6.3 08/23/2013 0514   ALBUMIN 3.0* 08/23/2013 0514   AST 17 08/23/2013 0514   ALT 9 08/23/2013 0514   ALKPHOS 60 08/23/2013 0514   BILITOT 0.6 08/23/2013 0514   GFRNONAA 46* 08/23/2013 0514   GFRAA 53* 08/23/2013 0514    Lab Results  Component Value Date   HGBA1C 6.7* 08/22/2013   Lab Results  Component Value Date   VITAMINB12 399 08/04/2013   Lab Results  Component Value Date   TSH 0.918 08/23/2013      ASSESSMENT AND PLAN  79 y.o. year old Bishop  has a past medical history of Arthritis; Diabetes mellitus; Hypertension; Hypercholesteremia; Insomnia; Carpal tunnel syndrome; Diverticulosis; Nephrolithiasis; Dementia; and Prostate cancer. renal stone here to followup for memory loss. Marvin Bishop has stopped Marvin Bishop Aricept due to vomiting.Marvin Bishop was seen in the emergency room for vomiting and found to have a renal stone. The daughter thought this was due to Marvin Bishop Aricept and stopped the medication.Now she wants to restart. MMSE 17/30 items in orientation calculation 3 of 3 recall unable to repeat phrases unable to copy a figure . AFT 10. Clock drawing 4/4. GDS=3  Aricept 5 mg daily at 10 AM (1/2 of 10mg ) for 1 month then increase to 10mg  daily Call if Marvin Bishop has nausea or GI distress when restarting, will switch to a different drug Namenda F/U in 6 months Dale Tensas  Karma GanjaMartin, GNP, [facility], APRN  [facility] [address], [address] [place], [place] [address] [phone]

## 2013-09-02 NOTE — Patient Instructions (Signed)
Aricept 5 mg daily at 10 AM (1/2 of 10mg ) for 1 month then increase to 10mg  daily Call if patient has nausea or GI distress when restarting, will switch to a different drug F/U in 6 months

## 2013-09-17 ENCOUNTER — Other Ambulatory Visit: Payer: Self-pay | Admitting: Urology

## 2013-09-29 ENCOUNTER — Encounter (HOSPITAL_COMMUNITY)
Admission: RE | Admit: 2013-09-29 | Discharge: 2013-09-29 | Disposition: A | Discharge status: Home / Self Care | Payer: Medicare FFS | Source: Ambulatory Visit | Attending: Urology | Admitting: Urology

## 2013-09-29 ENCOUNTER — Encounter (INDEPENDENT_AMBULATORY_CARE_PROVIDER_SITE_OTHER): Payer: Self-pay

## 2013-09-29 ENCOUNTER — Encounter (HOSPITAL_COMMUNITY): Payer: Self-pay

## 2013-09-29 ENCOUNTER — Encounter (HOSPITAL_COMMUNITY): Payer: Self-pay | Admitting: Pharmacy Technician

## 2013-09-29 HISTORY — DX: Headache: R51

## 2013-09-29 HISTORY — DX: Unqualified visual loss, both eyes: H54.3

## 2013-09-29 HISTORY — DX: Polyneuropathy, unspecified: G62.9

## 2013-09-29 HISTORY — DX: Unspecified asthma, uncomplicated: J45.909

## 2013-09-29 HISTORY — DX: Other specified postprocedural states: Z98.890

## 2013-09-29 HISTORY — DX: Other specified postprocedural states: R11.2

## 2013-09-29 LAB — CBC
Hemoglobin: 12.2 g/dL — ABNORMAL LOW (ref 13.0–17.0)
Platelets: 230 10*3/uL (ref 150–400)
RBC: 4.29 MIL/uL (ref 4.22–5.81)
WBC: 5.7 10*3/uL (ref 4.0–10.5)

## 2013-09-29 LAB — BASIC METABOLIC PANEL
CO2: 25 mEq/L (ref 19–32)
Calcium: 9.6 mg/dL (ref 8.4–10.5)
Creatinine, Ser: 1.5 mg/dL — ABNORMAL HIGH (ref 0.50–1.35)
Glucose, Bld: 151 mg/dL — ABNORMAL HIGH (ref 70–99)
Potassium: 5.1 mEq/L (ref 3.5–5.1)
Sodium: 138 mEq/L (ref 135–145)

## 2013-09-29 NOTE — Progress Notes (Signed)
Abd acute with chest 08/22/13 on EPIC, EKG 08/23/13 on EPIC

## 2013-09-29 NOTE — Patient Instructions (Addendum)
22 Marvin Bishop  09/29/2013   Your procedure is scheduled on: 10/01/13  Report to [facility] at 10:30 AM.  Call this number if you have problems the morning of surgery [phone]: [phone]   Remember:   Do not eat food or drink liquids After Midnight.     Take these medicines the morning of surgery with A SIP OF WATER: simvastatin, eye drops (cosopt, durezol)   Do not wear jewelry, make-up or nail polish.  Do not wear lotions, powders, or perfumes. You may wear deodorant.  Do not shave 48 hours prior to surgery. Men may shave face and neck.  Do not bring valuables to the hospital.  Contacts, dentures or bridgework may not be worn into surgery.   Patients discharged the day of surgery will not be allowed to drive home.  Name and phone number of your driver: [name] [phone]    Birdie Sons, RN  pre op nurse call if needed [phone]    FAILURE TO FOLLOW THESE INSTRUCTIONS MAY RESULT IN CANCELLATION OF YOUR SURGERY   Patient Signature: ___________________________________________

## 2013-10-01 ENCOUNTER — Encounter (HOSPITAL_COMMUNITY)
Admission: RE | Disposition: A | Discharge status: Home / Self Care | Payer: Self-pay | Source: Ambulatory Visit | Attending: Urology

## 2013-10-01 ENCOUNTER — Ambulatory Visit (HOSPITAL_COMMUNITY): Payer: Medicare FFS | Admitting: Certified Registered Nurse Anesthetist

## 2013-10-01 ENCOUNTER — Encounter (HOSPITAL_COMMUNITY): Payer: Self-pay | Admitting: *Deleted

## 2013-10-01 ENCOUNTER — Encounter (HOSPITAL_COMMUNITY): Payer: Medicare FFS | Admitting: Certified Registered Nurse Anesthetist

## 2013-10-01 ENCOUNTER — Ambulatory Visit (HOSPITAL_BASED_OUTPATIENT_CLINIC_OR_DEPARTMENT_OTHER)
Admission: RE | Admit: 2013-10-01 | Discharge: 2013-10-01 | Disposition: A | Discharge status: Home / Self Care | Payer: Medicare FFS | Source: Ambulatory Visit | Attending: Urology | Admitting: Urology

## 2013-10-01 DIAGNOSIS — I10 Essential (primary) hypertension: Secondary | ICD-10-CM | POA: Insufficient documentation

## 2013-10-01 DIAGNOSIS — C61 Malignant neoplasm of prostate: Secondary | ICD-10-CM | POA: Insufficient documentation

## 2013-10-01 DIAGNOSIS — N134 Hydroureter: Secondary | ICD-10-CM | POA: Insufficient documentation

## 2013-10-01 DIAGNOSIS — E7800 Pure hypercholesterolemia, unspecified: Secondary | ICD-10-CM | POA: Insufficient documentation

## 2013-10-01 DIAGNOSIS — N201 Calculus of ureter: Secondary | ICD-10-CM | POA: Insufficient documentation

## 2013-10-01 DIAGNOSIS — E119 Type 2 diabetes mellitus without complications: Secondary | ICD-10-CM | POA: Insufficient documentation

## 2013-10-01 DIAGNOSIS — Z79899 Other long term (current) drug therapy: Secondary | ICD-10-CM | POA: Insufficient documentation

## 2013-10-01 HISTORY — PX: CYSTOSCOPY W/ URETERAL STENT PLACEMENT: SHX1429

## 2013-10-01 LAB — GLUCOSE, CAPILLARY
Glucose-Capillary: 56 mg/dL — ABNORMAL LOW (ref 70–99)
Glucose-Capillary: 67 mg/dL — ABNORMAL LOW (ref 70–99)
Glucose-Capillary: 88 mg/dL (ref 70–99)

## 2013-10-01 SURGERY — CYSTOSCOPY, WITH RETROGRADE PYELOGRAM AND URETERAL STENT INSERTION
Anesthesia: General | Site: Ureter | Laterality: Right

## 2013-10-01 SURGERY — CYSTOURETEROSCOPY, WITH STENT INSERTION
Anesthesia: General

## 2013-10-01 MED ORDER — LIDOCAINE HCL 2 % EX GEL
CUTANEOUS | Status: AC
  Filled 2013-10-01: qty 10

## 2013-10-01 MED ORDER — SULFAMETHOXAZOLE-TMP DS 800-160 MG PO TABS: 1.0000 | ORAL_TABLET | Freq: Two times a day (BID) | ORAL | Status: DC

## 2013-10-01 MED ORDER — FENTANYL CITRATE 0.05 MG/ML IJ SOLN
INTRAMUSCULAR | Status: DC | PRN
  Administered 2013-10-01 (×2): 25 ug via INTRAVENOUS

## 2013-10-01 MED ORDER — LIDOCAINE HCL (CARDIAC) 20 MG/ML IV SOLN
INTRAVENOUS | Status: DC | PRN
  Administered 2013-10-01: 100 mg via INTRAVENOUS

## 2013-10-01 MED ORDER — DEXTROSE 50 % IV SOLN
25.0000 mL | Freq: Once | INTRAVENOUS | Status: AC
  Administered 2013-10-01: 25 mL via INTRAVENOUS

## 2013-10-01 MED ORDER — LACTATED RINGERS IV SOLN
INTRAVENOUS | Status: DC
  Administered 2013-10-01: 1000 mL via INTRAVENOUS

## 2013-10-01 MED ORDER — MORPHINE SULFATE 10 MG/ML IJ SOLN: 2.0000 mg | INTRAMUSCULAR | Status: DC | PRN

## 2013-10-01 MED ORDER — SODIUM CHLORIDE 0.9 % IR SOLN
Status: DC | PRN
  Administered 2013-10-01: 3000 mL

## 2013-10-01 MED ORDER — DEXTROSE 50 % IV SOLN
INTRAVENOUS | Status: AC
  Filled 2013-10-01: qty 50

## 2013-10-01 MED ORDER — CEFAZOLIN SODIUM-DEXTROSE 2-3 GM-% IV SOLR
2.0000 g | INTRAVENOUS | Status: AC
  Administered 2013-10-01: 2 g via INTRAVENOUS

## 2013-10-01 MED ORDER — MORPHINE SULFATE 10 MG/ML IJ SOLN: 1.0000 mg | INTRAMUSCULAR | Status: DC | PRN

## 2013-10-01 MED ORDER — DEXAMETHASONE SODIUM PHOSPHATE 10 MG/ML IJ SOLN
INTRAMUSCULAR | Status: DC | PRN
  Administered 2013-10-01: 10 mg via INTRAVENOUS

## 2013-10-01 MED ORDER — FENTANYL CITRATE 0.05 MG/ML IJ SOLN: 25.0000 ug | INTRAMUSCULAR | Status: DC | PRN

## 2013-10-01 MED ORDER — MORPHINE SULFATE 2 MG/ML IJ SOLN
1.0000 mg | INTRAMUSCULAR | Status: DC | PRN
  Administered 2013-10-01: 1 mg via INTRAVENOUS
  Filled 2013-10-01: qty 1

## 2013-10-01 MED ORDER — DIATRIZOATE MEGLUMINE 30 % UR SOLN: URETHRAL | Status: DC | PRN

## 2013-10-01 MED ORDER — DEXAMETHASONE SODIUM PHOSPHATE 10 MG/ML IJ SOLN
INTRAMUSCULAR | Status: AC
  Filled 2013-10-01: qty 1

## 2013-10-01 MED ORDER — ONDANSETRON HCL 4 MG/2ML IJ SOLN
INTRAMUSCULAR | Status: AC
  Filled 2013-10-01: qty 2

## 2013-10-01 MED ORDER — PHENYLEPHRINE HCL 10 MG/ML IJ SOLN
INTRAMUSCULAR | Status: DC | PRN
  Administered 2013-10-01 (×2): 80 ug via INTRAVENOUS

## 2013-10-01 MED ORDER — LIDOCAINE HCL (CARDIAC) 20 MG/ML IV SOLN
INTRAVENOUS | Status: AC
  Filled 2013-10-01: qty 5

## 2013-10-01 MED ORDER — EPHEDRINE SULFATE 50 MG/ML IJ SOLN
INTRAMUSCULAR | Status: AC
  Filled 2013-10-01: qty 1

## 2013-10-01 MED ORDER — CEFAZOLIN SODIUM-DEXTROSE 2-3 GM-% IV SOLR
INTRAVENOUS | Status: AC
  Filled 2013-10-01: qty 50

## 2013-10-01 MED ORDER — PROPOFOL 10 MG/ML IV BOLUS
INTRAVENOUS | Status: DC | PRN
  Administered 2013-10-01: 150 mg via INTRAVENOUS

## 2013-10-01 MED ORDER — DEXTROSE 50 % IV SOLN
INTRAVENOUS | Status: DC | PRN
  Administered 2013-10-01: 12.5 g via INTRAVENOUS

## 2013-10-01 MED ORDER — METHYLENE BLUE 1 % INJ SOLN
INTRAMUSCULAR | Status: AC
  Filled 2013-10-01: qty 10

## 2013-10-01 MED ORDER — SODIUM CHLORIDE 0.9 % IJ SOLN
INTRAMUSCULAR | Status: AC
  Filled 2013-10-01: qty 10

## 2013-10-01 MED ORDER — LACTATED RINGERS IV SOLN: INTRAVENOUS | Status: DC

## 2013-10-01 MED ORDER — ONDANSETRON HCL 4 MG/2ML IJ SOLN
INTRAMUSCULAR | Status: DC | PRN
  Administered 2013-10-01: 4 mg via INTRAVENOUS

## 2013-10-01 MED ORDER — BELLADONNA ALKALOIDS-OPIUM 16.2-60 MG RE SUPP
RECTAL | Status: AC
  Filled 2013-10-01: qty 1

## 2013-10-01 MED ORDER — TRAMADOL HCL 50 MG PO TABS: 50.0000 mg | ORAL_TABLET | Freq: Four times a day (QID) | ORAL | Status: DC | PRN

## 2013-10-01 MED ORDER — PROPOFOL 10 MG/ML IV BOLUS
INTRAVENOUS | Status: AC
  Filled 2013-10-01: qty 20

## 2013-10-01 MED ORDER — FENTANYL CITRATE 0.05 MG/ML IJ SOLN
INTRAMUSCULAR | Status: AC
  Filled 2013-10-01: qty 2

## 2013-10-01 MED ORDER — IOHEXOL 300 MG/ML  SOLN
INTRAMUSCULAR | Status: DC | PRN
  Administered 2013-10-01: 50 mL via URETHRAL

## 2013-10-01 SURGICAL SUPPLY — 13 items
BAG URO CATCHER STRL LF (DRAPE) ×3 IMPLANT
BASKET ZERO TIP NITINOL 2.4FR (BASKET) ×3 IMPLANT
DRAPE CAMERA CLOSED 9X96 (DRAPES) ×3 IMPLANT
FIBER LASER FLEXIVA 365 (UROLOGICAL SUPPLIES) ×3 IMPLANT
GLOVE BIOGEL M 8.0 STRL (GLOVE) ×3 IMPLANT
GLOVE SURG SS PI 8.0 STRL IVOR (GLOVE) ×3 IMPLANT
GOWN PREVENTION PLUS XLARGE (GOWN DISPOSABLE) ×3 IMPLANT
GOWN STRL REIN XL XLG (GOWN DISPOSABLE) ×3 IMPLANT
MANIFOLD NEPTUNE II (INSTRUMENTS) ×3 IMPLANT
MARKER SKIN DUAL TIP RULER LAB (MISCELLANEOUS) ×3 IMPLANT
PACK CYSTO (CUSTOM PROCEDURE TRAY) ×3 IMPLANT
SHEATH URET ACCESS 12FR/55CM (UROLOGICAL SUPPLIES) ×3 IMPLANT
TUBING CONNECTING 10 (TUBING) ×3 IMPLANT

## 2013-10-01 NOTE — H&P (Signed)
Urology History and Physical Exam  CC: Right ureteral stone  HPI: 79 year old male presents for ureteroscopic management of a 10 mm right distal ureteral stone.  PMH: Past Medical History  Diagnosis Date  . Arthritis   . Diabetes mellitus   . Hypertension   . Hypercholesteremia   . Insomnia     occasional  . Nephrolithiasis     right UVJ stone  . Dementia   . Prostate cancer   . Memory loss   . Headache(784.0)     slight  . Asthma     hx of as child  . PONV (postoperative nausea and vomiting)   . Complete loss of vision     right eye  . Peripheral neuropathy     both legs    PSH: Past Surgical History  Procedure Laterality Date  . Appendectomy    . Pars plana vitrectomy  10/24/2011    Procedure: PARS PLANA VITRECTOMY WITH 23 GAUGE;  Surgeon: Shade Flood, MD;  Location: [facility] OR;  Service: Ophthalmology;  Laterality: Right;  . Tonsillectomy  as teen  . Knee arthroscopy Right   . Hemorroidectomy      Allergies: No Known Allergies  Medications: Prescriptions prior to admission  Medication Sig Dispense Refill  . aspirin EC 81 MG tablet Take 81 mg by mouth daily.      Marland Kitchen donepezil (ARICEPT) 10 MG tablet Take 10 mg by mouth at bedtime.       . dorzolamide-timolol (COSOPT) 22.3-6.8 MG/ML ophthalmic solution Place 1 drop into the right eye 2 (two) times daily.       . DUREZOL 0.05 % EMUL Place 1 drop into the right eye 2 (two) times daily.       . furosemide (LASIX) 20 MG tablet Take 20 mg by mouth every morning.       . Ginkgo Biloba (GINKGO PO) Take 2 capsules by mouth at bedtime. Gingko Smart Vitamin softgels      . glipiZIDE (GLUCOTROL) 10 MG tablet Take 10 mg by mouth 2 (two) times daily before a meal.       . latanoprost (XALATAN) 0.005 % ophthalmic solution Place 1 drop into the right eye at bedtime.       Marland Kitchen lisinopril (PRINIVIL,ZESTRIL) 2.5 MG tablet Take 2.5 mg by mouth every morning.       . metFORMIN (GLUCOPHAGE) 500 MG tablet Take 500 mg by mouth 2 (two)  times daily with a meal.       . pioglitazone (ACTOS) 45 MG tablet Take 45 mg by mouth every morning.       . simvastatin (ZOCOR) 40 MG tablet Take 40 mg by mouth every morning.       . Multiple Vitamin (MULTIVITAMIN WITH MINERALS) TABS tablet Take 1 tablet by mouth daily.         Social History: History   Social History  . Marital Status: Widowed    Spouse Name: N/A    Number of Children: 5  . Years of Education: 6 th   Occupational History  .      Retired   Social History Main Topics  . Smoking status: Former Smoker -- 0.25 packs/day    Types: Cigarettes    Quit date: 10/15/1966  . Smokeless tobacco: Never Used  . Alcohol Use: 0.0 oz/week     Comment: occasional  . Drug Use: No  . Sexual Activity: Not on file   Other Topics Concern  . Not on file  Social History Narrative   Patient lives at home alone with dog.   Retired.   Education.- 6 th  Grade   Right handed.   Caffeine-Soda , tea and coffee                Family History: Family History  Problem Relation Age of Onset  . Cancer Mother     uterus  . Heart Problems Father   . Cancer Brother     stomach  . Cancer Brother     throat  . Colon cancer Brother     Review of Systems: Positive: Flank pain, N/V Negative: . A further 10 point review of systems was negative except what is listed in the HPI.  Physical Exam: @VITALS2 @ General: No acute distress.  Awake. Head:  Normocephalic.  Atraumatic. ENT:  EOMI.  Mucous membranes moist Neck:  Supple.  No lymphadenopathy. CV:  S1 present. S2 present. Regular rate. Pulmonary: Equal effort bilaterally.  Clear to auscultation bilaterally. Abdomen: Soft.  Nontender to palpation. Skin:  Normal turgor.  No visible rash. Extremity: No gross deformity of bilateral upper extremities.  No gross deformity of    bilateral lower extremities. Neurologic: Alert. Appropriate mood.    Studies:  Recent Labs     09/29/13  1440  HGB  12.2*  WBC  5.7  PLT  230     Recent Labs     09/29/13  1440  NA  138  K  5.1  CL  107  CO2  25  BUN  26*  CREATININE  1.50*  CALCIUM  9.6  GFRNONAA  39*  GFRAA  45*     No results found for this basename: PT, INR, APTT,  in the last 72 hours   No components found with this basename: ABG,     Assessment:  10 mm right distal ureteral stone  Plan: Right ureteroscopy with laser/extracion of stone.

## 2013-10-01 NOTE — Progress Notes (Signed)
Report given to sandy in holding patient to come to holding now per Dr. Payton Doughty.

## 2013-10-01 NOTE — Anesthesia Preprocedure Evaluation (Addendum)
Anesthesia Evaluation  Patient identified by MRN, date of birth, ID band Patient awake    Reviewed: Allergy & Precautions, H&P , NPO status , Patient's Chart, lab work & pertinent test results  History of Anesthesia Complications (+) PONV  Airway Mallampati: II TM Distance: >3 FB Neck ROM: full    Dental  (+) Edentulous Upper, Edentulous Lower and Dental Advisory Given   Pulmonary asthma , former smoker,  breath sounds clear to auscultation  Pulmonary exam normal       Cardiovascular Exercise Tolerance: Good hypertension, Pt. on medications Rhythm:regular Rate:Normal     Neuro/Psych  Headaches, dementia negative psych ROS   GI/Hepatic negative GI ROS, Neg liver ROS,   Endo/Other  diabetes, Well Controlled, Type 2, Oral Hypoglycemic Agents  Renal/GU negative Renal ROS  negative genitourinary   Musculoskeletal   Abdominal   Peds  Hematology negative hematology ROS (+)   Anesthesia Other Findings   Reproductive/Obstetrics negative OB ROS                          Anesthesia Physical Anesthesia Plan  ASA: III  Anesthesia Plan: General   Post-op Pain Management:    Induction: Intravenous  Airway Management Planned: LMA  Additional Equipment:   Intra-op Plan:   Post-operative Plan:   Informed Consent: I have reviewed the patients History and Physical, chart, labs and discussed the procedure including the risks, benefits and alternatives for the proposed anesthesia with the patient or authorized representative who has indicated his/her understanding and acceptance.   Dental Advisory Given  Plan Discussed with: CRNA and Surgeon  Anesthesia Plan Comments:         Anesthesia Quick Evaluation

## 2013-10-01 NOTE — Progress Notes (Signed)
CBG called to anesthesia

## 2013-10-01 NOTE — Anesthesia Postprocedure Evaluation (Signed)
  Anesthesia Post-op Note  Patient: Marvin Bishop  Procedure(s) Performed: Procedure(s) (LRB): cystoscopy right retrograde homium laserlithotripsy (Right)  Patient Location: PACU  Anesthesia Type: General  Level of Consciousness: awake and alert   Airway and Oxygen Therapy: Patient Spontanous Breathing  Post-op Pain: mild  Post-op Assessment: Post-op Vital signs reviewed, Patient's Cardiovascular Status Stable, Respiratory Function Stable, Patent Airway and No signs of Nausea or vomiting  Last Vitals:  Filed Vitals:   10/01/13 1500  Temp: 36.4 C    Post-op Vital Signs: stable   Complications: No apparent anesthesia complications

## 2013-10-01 NOTE — Op Note (Signed)
Preoperative diagnosis: Large, non-progressing right distal ureteral stone  Postoperative diagnosis: Same   Procedure: Cystoscopy, right ureteroscopy with holmium laser and extraction of right ureteral stone    Surgeon: Bertram Millard. Billal Rollo, M.D.   Anesthesia: Gen.   Complications: None  Specimen(s): Stone fragments, the family  Drain(s): None  Indications: 79 year-old male with a symptomatic, not progressing 10 mm right distal ureteral stone. He presents at this time for ureteroscopy, holmium laser and extraction. Risks have been discussed with the patient and his daughter who understands these and desire to proceed.    Technique and findings: The patient was properly identified in the holding area. The patient's right side was properly marked. He received preoperative IV Ancef. Taken the operating room where general anesthetic was administered with the LMA. He was placed in the dorsolithotomy position. Genitalia and perineum were prepped and draped, time out was performed.  A 22 French panendoscope was advanced under direct vision through his urethra which was nonobstructive. Prostate appeared normal and was nonobstructive. The bladder was entered and inspected circumferentially. No tumors trabeculations or foreign bodies were noted. The left ureteral orifice was identified and was normal. The right was somewhat difficult to properly identify as there was a stone just proximal to it. It was in a strange angle. I was eventually able to guide a guidewire through the orifice and using fluoroscopic guidance up into the right renal pelvis. The stone was evident on the fluoroscopy. I dilated the distal ureter and orifice with a 15 French ureteral access sheath. This was easily done. I then passed a 6 French ureteroscope and encounter the stone. He was too large to snare, and I fragmented into multiple smaller fragments with 360  fiber and the holmium laser. Multiple small fragments were then  extracted into the bladder. Following vigorous grasping of the small fragments, there were no fragments left within the ureter which was inspected as proximal as the ureteroscope and allow me to travel. No further stones were noted although there was right hydroureter proximal to where the stone was. Some of the fragments of the bladder were irrigated and saved. I felt that the rest of the fragments could be easily avoided by the patient, as he had a nonobstructive prostate. I did not leave a stent. The patient's bladder was drained and the scope removed. He was awakened and then taken to the PACU in stable condition, having tolerated the procedure well.

## 2013-10-01 NOTE — Preoperative (Signed)
Beta Blockers   Reason not to administer Beta Blockers:Not Applicable 

## 2013-10-01 NOTE — Transfer of Care (Signed)
Immediate Anesthesia Transfer of Care Note  Patient: Marvin Bishop  Procedure(s) Performed: Procedure(s) (LRB): cystoscopy right retrograde homium laserlithotripsy (Right) HOLMIUM LASER APPLICATION (Right)  Patient Location: PACU  Anesthesia Type: General  Level of Consciousness: sedated, patient cooperative and responds to stimulation  Airway & Oxygen Therapy: Patient Spontanous Breathing and Patient connected to face mask oxgen  Post-op Assessment: Report given to PACU RN and Post -op Vital signs reviewed and stable  Post vital signs: Reviewed and stable  Complications: No apparent anesthesia complications

## 2013-10-02 ENCOUNTER — Encounter (HOSPITAL_COMMUNITY): Payer: Self-pay | Admitting: Urology

## 2013-10-22 ENCOUNTER — Encounter: Payer: Self-pay | Admitting: Podiatry

## 2013-10-22 ENCOUNTER — Ambulatory Visit (INDEPENDENT_AMBULATORY_CARE_PROVIDER_SITE_OTHER): Payer: Medicare PPO | Admitting: Podiatry

## 2013-10-22 VITALS — BP 142/79 | HR 94 | Resp 16

## 2013-10-22 DIAGNOSIS — B351 Tinea unguium: Secondary | ICD-10-CM

## 2013-10-22 DIAGNOSIS — M79609 Pain in unspecified limb: Secondary | ICD-10-CM

## 2013-10-22 NOTE — Patient Instructions (Signed)
Diabetes and Foot Care Diabetes may cause you to have problems because of poor blood supply (circulation) to your feet and legs. This may cause the skin on your feet to become thinner, break easier, and heal more slowly. Your skin may become dry, and the skin may peel and crack. You may also have nerve damage in your legs and feet causing decreased feeling in them. You may not notice minor injuries to your feet that could lead to infections or more serious problems. Taking care of your feet is one of the most important things you can do for yourself.  HOME CARE INSTRUCTIONS  Wear shoes at all times, even in the house. Do not go barefoot. Bare feet are easily injured.  Check your feet daily for blisters, cuts, and redness. If you cannot see the bottom of your feet, use a mirror or ask someone for help.  Wash your feet with warm water (do not use hot water) and mild soap. Then pat your feet and the areas between your toes until they are completely dry. Do not soak your feet as this can dry your skin.  Apply a moisturizing lotion or petroleum jelly (that does not contain alcohol and is unscented) to the skin on your feet and to dry, brittle toenails. Do not apply lotion between your toes.  Trim your toenails straight across. Do not dig under them or around the cuticle. File the edges of your nails with an emery board or nail file.  Do not cut corns or calluses or try to remove them with medicine.  Wear clean socks or stockings every day. Make sure they are not too tight. Do not wear knee-high stockings since they may decrease blood flow to your legs.  Wear shoes that fit properly and have enough cushioning. To break in new shoes, wear them for just a few hours a day. This prevents you from injuring your feet. Always look in your shoes before you put them on to be sure there are no objects inside.  Do not cross your legs. This may decrease the blood flow to your feet.  If you find a minor scrape,  cut, or break in the skin on your feet, keep it and the skin around it clean and dry. These areas may be cleansed with mild soap and water. Do not cleanse the area with peroxide, alcohol, or iodine.  When you remove an adhesive bandage, be sure not to damage the skin around it.  If you have a wound, look at it several times a day to make sure it is healing.  Do not use heating pads or hot water bottles. They may burn your skin. If you have lost feeling in your feet or legs, you may not know it is happening until it is too late.  Make sure your health care provider performs a complete foot exam at least annually or more often if you have foot problems. Report any cuts, sores, or bruises to your health care provider immediately. SEEK MEDICAL CARE IF:   You have an injury that is not healing.  You have cuts or breaks in the skin.  You have an ingrown nail.  You notice redness on your legs or feet.  You feel burning or tingling in your legs or feet.  You have pain or cramps in your legs and feet.  Your legs or feet are numb.  Your feet always feel cold. SEEK IMMEDIATE MEDICAL CARE IF:   There is increasing redness,   swelling, or pain in or around a wound.  There is a red line that goes up your leg.  Pus is coming from a wound.  You develop a fever or as directed by your health care provider.  You notice a bad smell coming from an ulcer or wound. Document Released: 09/28/2000 Document Revised: 06/03/2013 Document Reviewed: 03/10/2013 Exit[employer] Patient Information 2014 [employer].  

## 2013-10-22 NOTE — Progress Notes (Signed)
Subjective:     Patient ID: Marvin Bishop, male   DOB: 1920/02/23, 80 y.o.   MRN: [id number]  HPI patient is found to have severe thick toenails 1-5 both feet that are painful when pressed and impossible for him to cut   Review of Systems     Objective:   Physical Exam I noted there to be thick toenails with pain 1-5 both feet that are dystrophic    Assessment:     Mycotic nail infection with pain 1-5 both feet    Plan:     Debridement of painful nailbeds 1-5 both feet with no iatrogenic bleeding noted

## 2013-11-12 ENCOUNTER — Ambulatory Visit: Payer: Medicare PPO | Admitting: Podiatry

## 2014-01-25 ENCOUNTER — Encounter: Payer: Self-pay | Admitting: Podiatry

## 2014-01-25 ENCOUNTER — Ambulatory Visit (INDEPENDENT_AMBULATORY_CARE_PROVIDER_SITE_OTHER): Payer: Medicare PPO | Admitting: Podiatry

## 2014-01-25 DIAGNOSIS — B351 Tinea unguium: Secondary | ICD-10-CM

## 2014-01-25 DIAGNOSIS — M79609 Pain in unspecified limb: Secondary | ICD-10-CM

## 2014-01-25 NOTE — Patient Instructions (Signed)
Diabetes and Foot Care Diabetes may cause you to have problems because of poor blood supply (circulation) to your feet and legs. This may cause the skin on your feet to become thinner, break easier, and heal more slowly. Your skin may become dry, and the skin may peel and crack. You may also have nerve damage in your legs and feet causing decreased feeling in them. You may not notice minor injuries to your feet that could lead to infections or more serious problems. Taking care of your feet is one of the most important things you can do for yourself.  HOME CARE INSTRUCTIONS  Wear shoes at all times, even in the house. Do not go barefoot. Bare feet are easily injured.  Check your feet daily for blisters, cuts, and redness. If you cannot see the bottom of your feet, use a mirror or ask someone for help.  Wash your feet with warm water (do not use hot water) and mild soap. Then pat your feet and the areas between your toes until they are completely dry. Do not soak your feet as this can dry your skin.  Apply a moisturizing lotion or petroleum jelly (that does not contain alcohol and is unscented) to the skin on your feet and to dry, brittle toenails. Do not apply lotion between your toes.  Trim your toenails straight across. Do not dig under them or around the cuticle. File the edges of your nails with an emery board or nail file.  Do not cut corns or calluses or try to remove them with medicine.  Wear clean socks or stockings every day. Make sure they are not too tight. Do not wear knee-high stockings since they may decrease blood flow to your legs.  Wear shoes that fit properly and have enough cushioning. To break in new shoes, wear them for just a few hours a day. This prevents you from injuring your feet. Always look in your shoes before you put them on to be sure there are no objects inside.  Do not cross your legs. This may decrease the blood flow to your feet.  If you find a minor scrape,  cut, or break in the skin on your feet, keep it and the skin around it clean and dry. These areas may be cleansed with mild soap and water. Do not cleanse the area with peroxide, alcohol, or iodine.  When you remove an adhesive bandage, be sure not to damage the skin around it.  If you have a wound, look at it several times a day to make sure it is healing.  Do not use heating pads or hot water bottles. They may burn your skin. If you have lost feeling in your feet or legs, you may not know it is happening until it is too late.  Make sure your health care provider performs a complete foot exam at least annually or more often if you have foot problems. Report any cuts, sores, or bruises to your health care provider immediately. SEEK MEDICAL CARE IF:   You have an injury that is not healing.  You have cuts or breaks in the skin.  You have an ingrown nail.  You notice redness on your legs or feet.  You feel burning or tingling in your legs or feet.  You have pain or cramps in your legs and feet.  Your legs or feet are numb.  Your feet always feel cold. SEEK IMMEDIATE MEDICAL CARE IF:   There is increasing redness,   swelling, or pain in or around a wound.  There is a red line that goes up your leg.  Pus is coming from a wound.  You develop a fever or as directed by your health care provider.  You notice a bad smell coming from an ulcer or wound. Document Released: 09/28/2000 Document Revised: 06/03/2013 Document Reviewed: 03/10/2013 Exit[employer] Patient Information 2014 [employer].  

## 2014-01-26 NOTE — Progress Notes (Signed)
Subjective:     Patient ID: Marvin Bishop, male   DOB: 01/07/1920, 80 y.o.   MRN: [id number]  HPI patient presents with painful thick nailbeds 1-5 both feet that he cannot cut himself   Review of Systems     Objective:   Physical Exam Neurovascular status unchanged with patient well oriented x3 and thick painful nailbeds that are brittle 1-5 both feet    Assessment:     Mycotic nail infection with pain 1-5 both feet    Plan:     Debridement painful nail bed 1-5 both feet with no iatrogenic bleeding noted

## 2014-02-03 ENCOUNTER — Other Ambulatory Visit (HOSPITAL_COMMUNITY): Payer: Self-pay | Admitting: Internal Medicine

## 2014-02-03 ENCOUNTER — Ambulatory Visit (HOSPITAL_COMMUNITY): Payer: Medicare FFS | Attending: Internal Medicine | Admitting: Radiology

## 2014-02-03 DIAGNOSIS — R609 Edema, unspecified: Secondary | ICD-10-CM | POA: Insufficient documentation

## 2014-02-03 NOTE — Progress Notes (Signed)
Echocardiogram performed.  

## 2014-03-04 ENCOUNTER — Encounter (INDEPENDENT_AMBULATORY_CARE_PROVIDER_SITE_OTHER): Payer: Self-pay

## 2014-03-04 ENCOUNTER — Ambulatory Visit (INDEPENDENT_AMBULATORY_CARE_PROVIDER_SITE_OTHER): Payer: Medicare PPO | Admitting: Nurse Practitioner

## 2014-03-04 ENCOUNTER — Encounter: Payer: Self-pay | Admitting: Nurse Practitioner

## 2014-03-04 VITALS — BP 98/56 | HR 93 | Ht 72.0 in | Wt 219.0 lb

## 2014-03-04 DIAGNOSIS — R413 Other amnesia: Secondary | ICD-10-CM

## 2014-03-04 DIAGNOSIS — F0390 Unspecified dementia without behavioral disturbance: Secondary | ICD-10-CM

## 2014-03-04 MED ORDER — DONEPEZIL HCL 10 MG PO TABS: 10.0000 mg | ORAL_TABLET | Freq: Every day | ORAL | Status: DC

## 2014-03-04 NOTE — Progress Notes (Signed)
[facility]  PATIENT: Marvin Bishop DOB: 1919-11-20   REASON FOR VISIT: Followup for dementia   HISTORY OF PRESENT ILLNESS: Mr. Lamison, 80 year old male returns for followup he has a history of dementia and just got back from Aricept at his last visit 09/02/2013. He has not had any side effects to the medication as he had previously thought he was nauseated after initiation. His Mini-Mental Status exam has improved a little today. He continues to live alone but has family to check on him frequently. He also has someone to administer his meds that is a paid caregiver. He is accompanied by a daughter today she denies that there have been any safety issues identified. He continues to cook his breakfast but gets Meals on Wheels. He  is continuing to drive some for short errands. He has not had any accidents. MRI showed mild changes of chronic microvascular ischemia and generalized cerebral atrophy.He returns for reevaluation   HISTORY: 80 years old right-handed male, accompanied by her daughter, referred by her primary care physician Dr. Benita Stabile for evaluation of memory trouble  He had a past medical history of hypertension, diabetes, hyperlipidemia, prostate cancer, had a history of a left knee reconstruction surgery  He had sixth-years of education, is a retired Sports coach in 2007, he lives alone, still driving short distance, over the past one year, his daughter noticed that he has become forgetful, he forget to take his medications, he misplace things, he often gets out of his car, forgot to close his car door, he also has mild gait difficulty, using a cane.  He denies sleep difficulty, has mild gait difficulty, he denies bowel and bladder incontinence, he can still pay his own bill, cooking simple meals without significant difficulty. He still lives by himself, he still drives short distance, has not got lost yet.  UPDATE Aug 04 2013:  MRI showed mild changes of chronic  microvascular ischemia and generalized cerebral atrophy.  Lab Jan 2014, normal CBX, CMP, A1c 6.1, LDL 100, TSH.  He is taking over counter MVI, he has tried Aricept, but has lost appetite, daughter does not want him to try any medications recently   REVIEW OF SYSTEMS: Full 14 system review of systems performed and notable only for those listed, all others are neg:  Constitutional: N/A  Cardiovascular: N/A  Ear/Nose/Throat: N/A  Skin: N/A  Eyes: N/A  Respiratory: N/A  Gastroitestinal: N/A  Hematology/Lymphatic: N/A  Endocrine: N/A Musculoskeletal:N/A  Allergy/Immunology: N/A  Neurological: Memory loss Psychiatric: N/A Sleep : NA   ALLERGIES: No Known Allergies  HOME MEDICATIONS: Outpatient Prescriptions Prior to Visit  Medication Sig Dispense Refill  . aspirin EC 81 MG tablet Take 81 mg by mouth daily.      Marland Kitchen donepezil (ARICEPT) 10 MG tablet Take 10 mg by mouth at bedtime.       . dorzolamide-timolol (COSOPT) 22.3-6.8 MG/ML ophthalmic solution Place 1 drop into the right eye 2 (two) times daily.       . DUREZOL 0.05 % EMUL Place 1 drop into the right eye 2 (two) times daily.       . furosemide (LASIX) 20 MG tablet Take 20 mg by mouth every morning.       . Ginkgo Biloba (GINKGO PO) Take 2 capsules by mouth at bedtime. Gingko Smart Vitamin softgels      . glipiZIDE (GLUCOTROL) 10 MG tablet Take 10 mg by mouth 2 (two) times daily before a meal.       .  latanoprost (XALATAN) 0.005 % ophthalmic solution Place 1 drop into the right eye at bedtime.       Marland Kitchen lisinopril (PRINIVIL,ZESTRIL) 2.5 MG tablet Take 2.5 mg by mouth every morning.       . Multiple Vitamin (MULTIVITAMIN WITH MINERALS) TABS tablet Take 1 tablet by mouth daily.      . simvastatin (ZOCOR) 40 MG tablet Take 40 mg by mouth every morning.       . sulfamethoxazole-trimethoprim (BACTRIM DS) 800-160 MG per tablet Take 1 tablet by mouth 2 (two) times daily.      . traMADol (ULTRAM) 50 MG tablet Take 1 tablet (50 mg total)  by mouth every 6 (six) hours as needed.  30 tablet    . metFORMIN (GLUCOPHAGE) 500 MG tablet Take 500 mg by mouth 2 (two) times daily with a meal.       . pioglitazone (ACTOS) 45 MG tablet Take 45 mg by mouth every morning.        No facility-administered medications prior to visit.    PAST MEDICAL HISTORY: Past Medical History  Diagnosis Date  . Arthritis   . Diabetes mellitus   . Hypertension   . Hypercholesteremia   . Insomnia     occasional  . Nephrolithiasis     right UVJ stone  . Dementia   . Prostate cancer   . Memory loss   . Headache(784.0)     slight  . Asthma     hx of as child  . PONV (postoperative nausea and vomiting)   . Complete loss of vision     right eye  . Peripheral neuropathy     both legs    PAST SURGICAL HISTORY: Past Surgical History  Procedure Laterality Date  . Appendectomy    . Pars plana vitrectomy  10/24/2011    Procedure: PARS PLANA VITRECTOMY WITH 23 GAUGE;  Surgeon: Adonis Brook, MD;  Location: [place];  Service: Ophthalmology;  Laterality: Right;  . Tonsillectomy  as teen  . Knee arthroscopy Right   . Hemorroidectomy    . Cystoscopy w/ ureteral stent placement Right 10/01/2013    Procedure: cystoscopy right retrograde holmium laser lithotripsy;  Surgeon: Franchot Gallo, MD;  Location: [facility];  Service: Urology;  Laterality: Right;  holmium laser application    FAMILY HISTORY: Family History  Problem Relation Age of Onset  . Cancer Mother     uterus  . Heart Problems Father   . Cancer Brother     stomach  . Cancer Brother     throat  . Colon cancer Brother     SOCIAL HISTORY: History   Social History  . Marital Status: Widowed    Spouse Name: N/A    Number of Children: 5  . Years of Education: 6 th   Occupational History  .      Retired   Social History Main Topics  . Smoking status: Former Smoker -- 0.25 packs/day    Types: Cigarettes    Quit date: 10/15/1966  . Smokeless tobacco: Never Used  . Alcohol Use:  0.0 oz/week     Comment: occasional  . Drug Use: No  . Sexual Activity: Not on file   Other Topics Concern  . Not on file   Social History Narrative   Patient lives at home alone with dog.   Retired.   Education.- 6 th  Grade   Right handed.   Caffeine-Soda , tea and coffee  PHYSICAL EXAM  Filed Vitals:   03/04/14 1414  BP: 98/56  Pulse: 93  Height: 6' (1.829 m)  Weight: 219 lb (99.338 kg)   Body mass index is 29.7 kg/(m^2).  Generalized: Well developed, in no acute distress  Head: normocephalic and atraumatic,. Oropharynx benign   Musculoskeletal: No deformity   Neurological examination   Mentation: Alert , MMSE 19/30 missing items in orientation and 3 out of 3 recall.AFT 11 Follows all commands speech and language fluent  Cranial nerve II-XII: Left Pupil were equal round reactive to light. Static right ptosis and irregular pupil.  extraocular movements were full, visual field were full on confrontational test. Facial sensation and strength were normal. hearing was intact to finger rubbing bilaterally. Uvula tongue midline. head turning and shoulder shrug were normal and symmetric.Tongue protrusion into cheek strength was normal. Motor: normal bulk and tone, full strength in the BUE, BLE, fine finger movements normal, no pronator drift. No focal weakness Coordination: finger-nose-finger, heel-to-shin bilaterally, no dysmetria Reflexes: Brachioradialis 2/2, biceps 2/2, triceps 2/2, patellar 2/2, Achilles absent, plantar responses were flexor bilaterally. Gait and Station: Rising up from seated position without assistance, cautious  unsteady gait, stooped posture no assistive device DIAGNOSTIC DATA (LABS, IMAGING, TESTING) - I reviewed patient records, labs, notes, testing and imaging myself where available.  Lab Results  Component Value Date   WBC 5.7 09/29/2013   HGB 12.2* 09/29/2013   HCT 36.2* 09/29/2013   MCV 84.4 09/29/2013   PLT 230  09/29/2013      Component Value Date/Time   NA 138 09/29/2013 1440   K 5.1 09/29/2013 1440   CL 107 09/29/2013 1440   CO2 25 09/29/2013 1440   GLUCOSE 151* 09/29/2013 1440   BUN 26* 09/29/2013 1440   CREATININE 1.50* 09/29/2013 1440   CALCIUM 9.6 09/29/2013 1440   PROT 6.3 08/23/2013 0514   ALBUMIN 3.0* 08/23/2013 0514   AST 17 08/23/2013 0514   ALT 9 08/23/2013 0514   ALKPHOS 60 08/23/2013 0514   BILITOT 0.6 08/23/2013 0514   GFRNONAA 39* 09/29/2013 1440   GFRAA 45* 09/29/2013 1440    Lab Results  Component Value Date   HGBA1C 6.7* 08/22/2013   Lab Results  Component Value Date   VITAMINB12 399 08/04/2013   Lab Results  Component Value Date   TSH 0.918 08/23/2013      ASSESSMENT AND PLAN  80 y.o. year old male  has a past medical history of dementia here to followup. He is currently on Aricept without side effects.  Patient to continue Aricept at 10mg  daily will refill Followup in 6 months Dennie Bible, [facility], [facility], [facility] [address], [place], [place] [address] [phone]

## 2014-03-04 NOTE — Patient Instructions (Signed)
Patient to continue Aricept at 10mg  daily will refill Followup in 6 months

## 2014-03-24 ENCOUNTER — Telehealth: Payer: Self-pay | Admitting: Nurse Practitioner

## 2014-03-24 NOTE — Telephone Encounter (Signed)
Spoke with  Olivia(daughter), she seems to think that the medication is causing nausea, lathargic, zombie state. The patient is still taking medicine and has a prescription waiting for pick up at pharmacy

## 2014-03-24 NOTE — Telephone Encounter (Signed)
Daughter [name] has concerns about donepezil (ARICEPT) 10 MG tablet.  Makes patient more withdrawn, less energy, and nauseated.  Daughter wanting to know if there's another medication that could be substituted.   Please call and advise.

## 2014-03-25 NOTE — Telephone Encounter (Signed)
TC to daughter. Made her aware his aricept was stopped last year and the nausea was a kidney stone. He has an appt to be evaluated by primary care tomorrow to look for causes of his nausea. They daughter  could also decrease the dose to 5 mg of Aricept. Discussed taking the patch she is not interested in that as the patient lives alone. She will decrease the dose and call back if needed if no reason for the nausea is found.

## 2014-03-26 ENCOUNTER — Other Ambulatory Visit: Payer: Self-pay | Admitting: Internal Medicine

## 2014-03-26 DIAGNOSIS — R1011 Right upper quadrant pain: Secondary | ICD-10-CM

## 2014-03-31 ENCOUNTER — Ambulatory Visit
Admission: RE | Admit: 2014-03-31 | Discharge: 2014-03-31 | Disposition: A | Discharge status: Home / Self Care | Payer: Medicare FFS | Source: Ambulatory Visit | Attending: Internal Medicine | Admitting: Internal Medicine

## 2014-03-31 DIAGNOSIS — R1011 Right upper quadrant pain: Secondary | ICD-10-CM

## 2014-04-26 ENCOUNTER — Ambulatory Visit: Payer: Medicare PPO | Admitting: Nurse Practitioner

## 2014-05-17 ENCOUNTER — Ambulatory Visit: Payer: Medicare PPO | Admitting: Podiatry

## 2014-05-19 ENCOUNTER — Emergency Department (HOSPITAL_COMMUNITY): Payer: Medicare FFS

## 2014-05-19 ENCOUNTER — Encounter (HOSPITAL_COMMUNITY): Payer: Self-pay | Admitting: Emergency Medicine

## 2014-05-19 ENCOUNTER — Emergency Department (HOSPITAL_COMMUNITY)
Admission: EM | Admit: 2014-05-19 | Discharge: 2014-05-20 | Disposition: A | Discharge status: Home / Self Care | Payer: Medicare FFS | Attending: Emergency Medicine | Admitting: Emergency Medicine

## 2014-05-19 DIAGNOSIS — Z794 Long term (current) use of insulin: Secondary | ICD-10-CM | POA: Diagnosis not present

## 2014-05-19 DIAGNOSIS — R112 Nausea with vomiting, unspecified: Secondary | ICD-10-CM | POA: Diagnosis not present

## 2014-05-19 DIAGNOSIS — Z79899 Other long term (current) drug therapy: Secondary | ICD-10-CM | POA: Insufficient documentation

## 2014-05-19 DIAGNOSIS — R413 Other amnesia: Secondary | ICD-10-CM | POA: Diagnosis not present

## 2014-05-19 DIAGNOSIS — Z8659 Personal history of other mental and behavioral disorders: Secondary | ICD-10-CM | POA: Diagnosis not present

## 2014-05-19 DIAGNOSIS — J45909 Unspecified asthma, uncomplicated: Secondary | ICD-10-CM | POA: Insufficient documentation

## 2014-05-19 DIAGNOSIS — E785 Hyperlipidemia, unspecified: Secondary | ICD-10-CM | POA: Insufficient documentation

## 2014-05-19 DIAGNOSIS — G4700 Insomnia, unspecified: Secondary | ICD-10-CM | POA: Diagnosis not present

## 2014-05-19 DIAGNOSIS — Z7982 Long term (current) use of aspirin: Secondary | ICD-10-CM | POA: Diagnosis not present

## 2014-05-19 DIAGNOSIS — R1084 Generalized abdominal pain: Secondary | ICD-10-CM | POA: Diagnosis not present

## 2014-05-19 DIAGNOSIS — Z9089 Acquired absence of other organs: Secondary | ICD-10-CM | POA: Insufficient documentation

## 2014-05-19 DIAGNOSIS — H5460 Unqualified visual loss, one eye, unspecified: Secondary | ICD-10-CM | POA: Diagnosis not present

## 2014-05-19 DIAGNOSIS — K5289 Other specified noninfective gastroenteritis and colitis: Secondary | ICD-10-CM | POA: Diagnosis not present

## 2014-05-19 DIAGNOSIS — E119 Type 2 diabetes mellitus without complications: Secondary | ICD-10-CM | POA: Diagnosis not present

## 2014-05-19 DIAGNOSIS — K5900 Constipation, unspecified: Secondary | ICD-10-CM | POA: Diagnosis present

## 2014-05-19 DIAGNOSIS — I10 Essential (primary) hypertension: Secondary | ICD-10-CM | POA: Insufficient documentation

## 2014-05-19 DIAGNOSIS — Z87891 Personal history of nicotine dependence: Secondary | ICD-10-CM | POA: Diagnosis not present

## 2014-05-19 DIAGNOSIS — K529 Noninfective gastroenteritis and colitis, unspecified: Secondary | ICD-10-CM

## 2014-05-19 DIAGNOSIS — Z87442 Personal history of urinary calculi: Secondary | ICD-10-CM | POA: Diagnosis not present

## 2014-05-19 DIAGNOSIS — M129 Arthropathy, unspecified: Secondary | ICD-10-CM | POA: Insufficient documentation

## 2014-05-19 DIAGNOSIS — Z8546 Personal history of malignant neoplasm of prostate: Secondary | ICD-10-CM | POA: Insufficient documentation

## 2014-05-19 LAB — CBC WITH DIFFERENTIAL/PLATELET
BASOS ABS: 0 10*3/uL (ref 0.0–0.1)
BASOS PCT: 0 % (ref 0–1)
Eosinophils Absolute: 0.2 10*3/uL (ref 0.0–0.7)
Eosinophils Relative: 1 % (ref 0–5)
HCT: 37.9 % — ABNORMAL LOW (ref 39.0–52.0)
Hemoglobin: 12.8 g/dL — ABNORMAL LOW (ref 13.0–17.0)
Lymphocytes Relative: 9 % — ABNORMAL LOW (ref 12–46)
Lymphs Abs: 1.5 10*3/uL (ref 0.7–4.0)
MCH: 28.1 pg (ref 26.0–34.0)
MCHC: 33.8 g/dL (ref 30.0–36.0)
MCV: 83.1 fL (ref 78.0–100.0)
Monocytes Absolute: 1 10*3/uL (ref 0.1–1.0)
Monocytes Relative: 6 % (ref 3–12)
NEUTROS PCT: 84 % — AB (ref 43–77)
Neutro Abs: 14.6 10*3/uL — ABNORMAL HIGH (ref 1.7–7.7)
PLATELETS: 258 10*3/uL (ref 150–400)
RBC: 4.56 MIL/uL (ref 4.22–5.81)
RDW: 15 % (ref 11.5–15.5)
WBC: 17.3 10*3/uL — ABNORMAL HIGH (ref 4.0–10.5)

## 2014-05-19 LAB — COMPREHENSIVE METABOLIC PANEL
ALBUMIN: 3.7 g/dL (ref 3.5–5.2)
ALK PHOS: 63 U/L (ref 39–117)
ALT: 10 U/L (ref 0–53)
AST: 16 U/L (ref 0–37)
Anion gap: 15 (ref 5–15)
BUN: 28 mg/dL — ABNORMAL HIGH (ref 6–23)
CO2: 21 mEq/L (ref 19–32)
Calcium: 9.9 mg/dL (ref 8.4–10.5)
Chloride: 104 mEq/L (ref 96–112)
Creatinine, Ser: 1.52 mg/dL — ABNORMAL HIGH (ref 0.50–1.35)
GFR calc Af Amer: 44 mL/min — ABNORMAL LOW (ref >90)
GFR calc non Af Amer: 38 mL/min — ABNORMAL LOW (ref >90)
Glucose, Bld: 189 mg/dL — ABNORMAL HIGH (ref 70–99)
POTASSIUM: 4 meq/L (ref 3.7–5.3)
SODIUM: 140 meq/L (ref 137–147)
Total Bilirubin: 0.7 mg/dL (ref 0.3–1.2)
Total Protein: 7.4 g/dL (ref 6.0–8.3)

## 2014-05-19 LAB — I-STAT CG4 LACTIC ACID, ED: LACTIC ACID, VENOUS: 1.75 mmol/L (ref 0.5–2.2)

## 2014-05-19 LAB — LIPASE, BLOOD: Lipase: 41 U/L (ref 11–59)

## 2014-05-19 MED ORDER — ONDANSETRON HCL 4 MG/2ML IJ SOLN
4.0000 mg | Freq: Once | INTRAMUSCULAR | Status: AC
  Administered 2014-05-19: 4 mg via INTRAVENOUS
  Filled 2014-05-19: qty 2

## 2014-05-19 MED ORDER — SODIUM CHLORIDE 0.9 % IV BOLUS (SEPSIS)
500.0000 mL | Freq: Once | INTRAVENOUS | Status: AC
  Administered 2014-05-19: 500 mL via INTRAVENOUS

## 2014-05-19 NOTE — ED Notes (Signed)
PA at bedside.

## 2014-05-19 NOTE — ED Notes (Signed)
Pt has had 2 x episodes of very large BM w/ foul odor

## 2014-05-19 NOTE — ED Notes (Signed)
Patient still having BM & endorses nausea. Abigail PA-C made aware.

## 2014-05-19 NOTE — ED Notes (Signed)
Per EMS pt has been having n/v x one month after stopping a med; pt c/o lower abdominal pain and c/o of constipation to family. C/o pain 10/10. Pt has hx dementia.

## 2014-05-19 NOTE — ED Provider Notes (Signed)
CSN: [id number]     Arrival date & time 05/19/14  1912 History   First MD Initiated Contact with Patient 05/19/14 1959     Chief Complaint  Patient presents with  . Emesis  . Constipation     (Consider location/radiation/quality/duration/timing/severity/associated sxs/prior Treatment) HPI  Marvin Bishop Is a 80 year old male with a past medical history of diabetes, hypertension, high cholesterol, dementia who was brought in by his daughters for complaint of one month of abdominal pain and nausea. The patient lives by himself. His daughters state that they come in and see them several times a day and that he has a caretaker who delivers his medications twice daily. He has been complaining of nausea intermittently throughout the past month. He has seen his primary care physician who thought he may be constipated and prescribed stool softeners. His daughters are unsure if he has made bowel movements normally. Today the patient began complaining of severe pain in his right lower quadrant and had one episode of non-bloody nonbilious vomitus.  Past Medical History  Diagnosis Date  . Arthritis   . Diabetes mellitus   . Hypertension   . Hypercholesteremia   . Insomnia     occasional  . Nephrolithiasis     right UVJ stone  . Dementia   . Prostate cancer   . Memory loss   . Headache(784.0)     slight  . Asthma     hx of as child  . PONV (postoperative nausea and vomiting)   . Complete loss of vision     right eye  . Peripheral neuropathy     both legs   Past Surgical History  Procedure Laterality Date  . Appendectomy    . Pars plana vitrectomy  10/24/2011    Procedure: PARS PLANA VITRECTOMY WITH 23 GAUGE;  Surgeon: Adonis Brook, MD;  Location: [facility];  Service: Ophthalmology;  Laterality: Right;  . Tonsillectomy  as teen  . Knee arthroscopy Right   . Hemorroidectomy    . Cystoscopy w/ ureteral stent placement Right 10/01/2013    Procedure: cystoscopy right retrograde holmium laser  lithotripsy;  Surgeon: Franchot Gallo, MD;  Location: [facility];  Service: Urology;  Laterality: Right;  holmium laser application   Family History  Problem Relation Age of Onset  . Cancer Mother     uterus  . Heart Problems Father   . Cancer Brother     stomach  . Cancer Brother     throat  . Colon cancer Brother    History  Substance Use Topics  . Smoking status: Former Smoker -- 0.25 packs/day    Types: Cigarettes    Quit date: 10/15/1966  . Smokeless tobacco: Never Used  . Alcohol Use: 0.0 oz/week     Comment: occasional    Review of Systems  Unable to perform ROS     Allergies  Donepezil  Home Medications   Prior to Admission medications   Medication Sig Start Date End Date Taking? Authorizing Provider  aspirin EC 81 MG tablet Take 81 mg by mouth daily.   Yes Historical Provider, MD  dorzolamide-timolol (COSOPT) 22.3-6.8 MG/ML ophthalmic solution Place 1 drop into the right eye 2 (two) times daily.  02/05/13  Yes Historical Provider, MD  DUREZOL 0.05 % EMUL Place 1 drop into the right eye 2 (two) times daily.  08/24/13  Yes Historical Provider, MD  esomeprazole (NEXIUM) 40 MG capsule Take 40 mg by mouth daily at 12 noon.   Yes Historical  Provider, MD  furosemide (LASIX) 20 MG tablet Take 20 mg by mouth every morning.    Yes Historical Provider, MD  gabapentin (NEURONTIN) 100 MG capsule Take 200 mg by mouth at bedtime.   Yes Historical Provider, MD  Ginkgo Biloba (GINKGO PO) Take 2 capsules by mouth at bedtime. Gingko Smart Vitamin softgels   Yes Historical Provider, MD  glipiZIDE (GLUCOTROL) 10 MG tablet Take 10 mg by mouth 2 (two) times daily before a meal.    Yes Historical Provider, MD  HYDROcodone-acetaminophen (NORCO/VICODIN) 5-325 MG per tablet Take 1 tablet by mouth every 6 (six) hours as needed for moderate pain.   Yes Historical Provider, MD  latanoprost (XALATAN) 0.005 % ophthalmic solution Place 1 drop into the right eye at bedtime.  02/05/13  Yes Historical  Provider, MD  LEVEMIR FLEXTOUCH 100 UNIT/ML Pen Inject 10 Units into the skin daily at 10 pm.  02/09/14  Yes Historical Provider, MD  linagliptin (TRADJENTA) 5 MG TABS tablet Take 5 mg by mouth daily.   Yes Historical Provider, MD  lisinopril (PRINIVIL,ZESTRIL) 2.5 MG tablet Take 2.5 mg by mouth every morning.    Yes Historical Provider, MD  meloxicam (MOBIC) 7.5 MG tablet Take 7.5 mg by mouth daily as needed for pain.   Yes Historical Provider, MD  Multiple Vitamin (MULTIVITAMIN WITH MINERALS) TABS tablet Take 1 tablet by mouth daily.   Yes Historical Provider, MD  sildenafil (VIAGRA) 50 MG tablet Take 50 mg by mouth daily as needed for erectile dysfunction.   Yes Historical Provider, MD  simvastatin (ZOCOR) 40 MG tablet Take 40 mg by mouth every morning.    Yes Historical Provider, MD  tamsulosin (FLOMAX) 0.4 MG CAPS capsule Take 0.4 mg by mouth daily after breakfast.   Yes Historical Provider, MD  temazepam (RESTORIL) 30 MG capsule Take 30 mg by mouth at bedtime as needed for sleep.   Yes Historical Provider, MD   BP 137/69  Pulse 94  Temp(Src) 97.4 F (36.3 C) (Oral)  Resp 18  SpO2 96% Physical Exam  Nursing note and vitals reviewed. Constitutional: He appears well-developed and well-nourished. No distress.  HENT:  Head: Normocephalic and atraumatic.  Eyes: Conjunctivae are normal. No scleral icterus.  Neck: Normal range of motion. Neck supple.  Cardiovascular: Normal rate, regular rhythm and normal heart sounds.   Pulmonary/Chest: Effort normal and breath sounds normal. No respiratory distress.  Abdominal: Soft. There is no tenderness.  Hyperactive bowel sounds. No tenderness to palpation. Tympanic upon percussion  Musculoskeletal: He exhibits no edema.  Neurological: He is alert.  Skin: Skin is warm and dry. He is not diaphoretic.  Psychiatric: His behavior is normal.    ED Course  Procedures (including critical care time) Labs Review Labs Reviewed  CBC WITH DIFFERENTIAL -  Abnormal; Notable for the following:    WBC 17.3 (*)    Hemoglobin 12.8 (*)    HCT 37.9 (*)    Neutrophils Relative % 84 (*)    Neutro Abs 14.6 (*)    Lymphocytes Relative 9 (*)    All other components within normal limits  COMPREHENSIVE METABOLIC PANEL - Abnormal; Notable for the following:    Glucose, Bld 189 (*)    BUN 28 (*)    Creatinine, Ser 1.52 (*)    GFR calc non Af Amer 38 (*)    GFR calc Af Amer 44 (*)    All other components within normal limits  LIPASE, BLOOD  URINALYSIS, ROUTINE W REFLEX MICROSCOPIC  I-STAT CG4 LACTIC ACID, ED    Imaging Review Dg Abd Acute W/chest  05/19/2014   CLINICAL DATA:  Abdominal pain and distention and vomiting.  Nausea.  EXAM: ACUTE ABDOMEN SERIES (ABDOMEN 2 VIEW & CHEST 1 VIEW)  COMPARISON:  Radiographs dated 09/09/2013 and 08/22/2013  FINDINGS: The heart size and pulmonary vascularity are normal and the lungs are clear. There is no free air in the abdomen.  There is air scattered throughout nondistended loops of large and small bowel. There are some air-fluid levels in the colon. Moderate stool in the rectum. Diffuse degenerative changes in the lumbar spine.  IMPRESSION: Air-fluid levels in the nondistended colon. Moderate stool in the rectum. Otherwise benign appearing abdomen and chest.   Electronically Signed   By: Rozetta Nunnery M.D.   On: 05/19/2014 20:36     EKG Interpretation None      MDM   Final diagnoses:  Generalized abdominal pain  Non-intractable vomiting with nausea, vomiting of unspecified type  Colitis  Constipation, unspecified constipation type    9:42 PM BP 137/69  Pulse 94  Temp(Src) 97.4 F (36.3 C) (Oral)  Resp 18  SpO2 96% With abdominal pain, nausea and vomiting. He had a large bowel movement here in the emergency department and is feeling much better. No further episodes of nausea no further episodes of vomiting since that time. He does have an elevated white count with a left shift. Seen in shared visit  with Dr. Kathrynn Humble.  He has a benign abdominal exam. Will obtain a CT scan with oral contrast only. He does appear to be dehydrated on top of chronic renal insufficiency. He is getting IV fluids.  The patient's CT scan returned. Shows evidence of some mild bowel wall thickening in the colon and. Maybe secondary to a developing diverticulitis. Patient will be given oral Cipro and Flagyl here. Discharged with Cipro Flagyl and Zofran. Patient had multiple voluminous stools while in the emergency department especially after drinking oral contrast. Cardiac his symptoms are due to chronic constipation. Plan of care, imaging and labs and discussed with Dr. Kathrynn Humble. He agrees with the plan of discharge with oral anti-emetic and Cipro Flagyl. Discussed bowel rest. Followup with primary care. Also discussed return precautions with the daughter.  Patient / Family / Caregiver informed of clinical course, understand medical decision-making process, and agree with plan. I personally reviewed the imaging tests through PACS system. I have reviewed and interpreted Lab values. I reviewed available ER/hospitalization records through the [place], [place] 05/20/14 8250

## 2014-05-20 MED ORDER — CIPROFLOXACIN HCL 500 MG PO TABS
500.0000 mg | ORAL_TABLET | Freq: Once | ORAL | Status: AC
  Administered 2014-05-20: 500 mg via ORAL
  Filled 2014-05-20: qty 1

## 2014-05-20 MED ORDER — METRONIDAZOLE 500 MG PO TABS
500.0000 mg | ORAL_TABLET | Freq: Once | ORAL | Status: AC
  Administered 2014-05-20: 500 mg via ORAL
  Filled 2014-05-20: qty 1

## 2014-05-20 MED ORDER — METRONIDAZOLE 500 MG PO TABS
500.0000 mg | ORAL_TABLET | Freq: Two times a day (BID) | ORAL | Status: DC
Start: 2014-05-20 — End: 2015-04-01

## 2014-05-20 MED ORDER — CIPROFLOXACIN HCL 500 MG PO TABS: 500.0000 mg | ORAL_TABLET | Freq: Two times a day (BID) | ORAL | Status: DC

## 2014-05-20 NOTE — Discharge Instructions (Signed)
Colitis Colitis is inflammation of the colon. Colitis can be a short-term or long-standing (chronic) illness. Crohn's disease and ulcerative colitis are 2 types of colitis which are chronic. They usually require lifelong treatment. CAUSES  There are many different causes of colitis, including:  Viruses.  Germs (bacteria).  Medicine reactions. SYMPTOMS   Diarrhea.  Intestinal bleeding.  Pain.  Fever.  Throwing up (vomiting).  Tiredness (fatigue).  Weight loss.  Bowel blockage. DIAGNOSIS  The diagnosis of colitis is based on examination and stool or blood tests. X-rays, CT scan, and colonoscopy may also be needed. TREATMENT  Treatment may include:  Fluids given through the vein (intravenously).  Bowel rest (nothing to eat or drink for a period of time).  Medicine for pain and diarrhea.  Medicines (antibiotics) that kill germs.  Cortisone medicines.  Surgery. HOME CARE INSTRUCTIONS   Get plenty of rest.  Drink enough water and fluids to keep your urine clear or pale yellow.  Eat a well-balanced diet.  Call your caregiver for follow-up as recommended. SEEK IMMEDIATE MEDICAL CARE IF:   You develop chills.  You have an oral temperature above 102 F (38.9 C), not controlled by medicine.  You have extreme weakness, fainting, or dehydration.  You have repeated vomiting.  You develop severe belly (abdominal) pain or are passing bloody or tarry stools. MAKE SURE YOU:   Understand these instructions.  Will watch your condition.  Will get help right away if you are not doing well or get worse. Document Released: 11/08/2004 Document Revised: 12/24/2011 Document Reviewed: 02/03/2010 [facility] Patient Information 2015 [place], [place]. This information is not intended to replace advice given to you by your health care provider. Make sure you discuss any questions you have with your health care provider. Clear Liquid Diet A clear liquid diet is a short-term diet  that is prescribed to provide the necessary fluid and basic energy you need when you can have nothing else. The clear liquid diet consists of liquids or solids that will become liquid at room temperature. You should be able to see through the liquid. There are many reasons that you may be restricted to clear liquids, such as:  When you have a sudden-onset (acute) condition that occurs before or after surgery.  To help your body slowly get adjusted to food again after a long period when you were unable to have food.  Replacement of fluids when you have a diarrheal disease.  When you are going to have certain exams, such as a colonoscopy, in which instruments are inserted inside your body to look at parts of your digestive system. WHAT CAN I HAVE? A clear liquid diet does not provide all the nutrients you need. It is important to choose a variety of the following items to get as many nutrients as possible:  Vegetable juices that do not have pulp.  Fruit juices and fruit drinks that do not have pulp.  Coffee (regular or decaffeinated), tea, or soda at the discretion of your health care provider.  Clear bouillon, broth, or strained broth-based soups.  High-protein and flavored gelatins.  Sugar or honey.  Ices or frozen ice pops that do not contain milk. If you are not sure whether you can have certain items, you should ask your health care provider. You may also ask your health care provider if there are any other clear liquid options. Document Released: 10/01/2005 Document Revised: 10/06/2013 Document Reviewed: 08/28/2013 [facility] Patient Information 2015 [place], [place]. This information is not intended to replace  advice given to you by your health care provider. Make sure you discuss any questions you have with your health care provider.  Constipation Constipation is when a person has fewer than three bowel movements a week, has difficulty having a bowel movement, or has stools that are  dry, hard, or larger than normal. As people grow older, constipation is more common. If you try to fix constipation with medicines that make you have a bowel movement (laxatives), the problem may get worse. Long-term laxative use may cause the muscles of the colon to become weak. A low-fiber diet, not taking in enough fluids, and taking certain medicines may make constipation worse.  CAUSES   Certain medicines, such as antidepressants, pain medicine, iron supplements, antacids, and water pills.   Certain diseases, such as diabetes, irritable bowel syndrome (IBS), thyroid disease, or depression.   Not drinking enough water.   Not eating enough fiber-rich foods.   Stress or travel.   Lack of physical activity or exercise.   Ignoring the urge to have a bowel movement.   Using laxatives too much.  SIGNS AND SYMPTOMS   Having fewer than three bowel movements a week.   Straining to have a bowel movement.   Having stools that are hard, dry, or larger than normal.   Feeling full or bloated.   Pain in the lower abdomen.   Not feeling relief after having a bowel movement.  DIAGNOSIS  Your health care provider will take a medical history and perform a physical exam. Further testing may be done for severe constipation. Some tests may include:  A barium enema X-ray to examine your rectum, colon, and, sometimes, your small intestine.   A sigmoidoscopy to examine your lower colon.   A colonoscopy to examine your entire colon. TREATMENT  Treatment will depend on the severity of your constipation and what is causing it. Some dietary treatments include drinking more fluids and eating more fiber-rich foods. Lifestyle treatments may include regular exercise. If these diet and lifestyle recommendations do not help, your health care provider may recommend taking over-the-counter laxative medicines to help you have bowel movements. Prescription medicines may be prescribed if  over-the-counter medicines do not work.  HOME CARE INSTRUCTIONS   Eat foods that have a lot of fiber, such as fruits, vegetables, whole grains, and beans.  Limit foods high in fat and processed sugars, such as french fries, hamburgers, cookies, candies, and soda.   A fiber supplement may be added to your diet if you cannot get enough fiber from foods.   Drink enough fluids to keep your urine clear or pale yellow.   Exercise regularly or as directed by your health care provider.   Go to the restroom when you have the urge to go. Do not hold it.   Only take over-the-counter or prescription medicines as directed by your health care provider. Do not take other medicines for constipation without talking to your health care provider first.  [place] IF:   You have bright red blood in your stool.   Your constipation lasts for more than 4 days or gets worse.   You have abdominal or rectal pain.   You have thin, pencil-like stools.   You have unexplained weight loss. MAKE SURE YOU:   Understand these instructions.  Will watch your condition.  Will get help right away if you are not doing well or get worse. Document Released: 06/29/2004 Document Revised: 10/06/2013 Document Reviewed: 07/13/2013 [employer] Patient Information 2015  [employer]. This information is not intended to replace advice given to you by your health care provider. Make sure you discuss any questions you have with your health care provider.

## 2014-05-28 NOTE — ED Provider Notes (Signed)
Medical screening examination/treatment/procedure(s) were conducted as a shared visit with non-physician practitioner(s) and myself.  I personally evaluated the patient during the encounter.   EKG Interpretation None      Pt comes in with abd pain. Non peritoneal exam. Hx of constipation. CT ordered - and   IMPRESSION:  Descending colonic wall thickening and mild inflammatory changes.  Mild diverticulosis. Findings could reflect colitis or mild  diverticulitis without bowel perforation, abscess nor obstruction.  Interval passage of right ureterovesicular junction calculus. No  urolithiasis nor obstructive uropathy   Pt had a BM here, and felt a lot better. Will d.c, antibiotics given and return precautions discussed.  Varney Biles, MD 05/28/14 [phone]

## 2014-06-10 ENCOUNTER — Ambulatory Visit (INDEPENDENT_AMBULATORY_CARE_PROVIDER_SITE_OTHER): Payer: Medicare PPO | Admitting: Podiatry

## 2014-06-10 DIAGNOSIS — M79673 Pain in unspecified foot: Secondary | ICD-10-CM

## 2014-06-10 DIAGNOSIS — B351 Tinea unguium: Secondary | ICD-10-CM

## 2014-06-10 DIAGNOSIS — M79609 Pain in unspecified limb: Secondary | ICD-10-CM

## 2014-06-10 NOTE — Patient Instructions (Signed)
Diabetes and Foot Care Diabetes may cause you to have problems because of poor blood supply (circulation) to your feet and legs. This may cause the skin on your feet to become thinner, break easier, and heal more slowly. Your skin may become dry, and the skin may peel and crack. You may also have nerve damage in your legs and feet causing decreased feeling in them. You may not notice minor injuries to your feet that could lead to infections or more serious problems. Taking care of your feet is one of the most important things you can do for yourself.  HOME CARE INSTRUCTIONS  Wear shoes at all times, even in the house. Do not go barefoot. Bare feet are easily injured.  Check your feet daily for blisters, cuts, and redness. If you cannot see the bottom of your feet, use a mirror or ask someone for help.  Wash your feet with warm water (do not use hot water) and mild soap. Then pat your feet and the areas between your toes until they are completely dry. Do not soak your feet as this can dry your skin.  Apply a moisturizing lotion or petroleum jelly (that does not contain alcohol and is unscented) to the skin on your feet and to dry, brittle toenails. Do not apply lotion between your toes.  Trim your toenails straight across. Do not dig under them or around the cuticle. File the edges of your nails with an emery board or nail file.  Do not cut corns or calluses or try to remove them with medicine.  Wear clean socks or stockings every day. Make sure they are not too tight. Do not wear knee-high stockings since they may decrease blood flow to your legs.  Wear shoes that fit properly and have enough cushioning. To break in new shoes, wear them for just a few hours a day. This prevents you from injuring your feet. Always look in your shoes before you put them on to be sure there are no objects inside.  Do not cross your legs. This may decrease the blood flow to your feet.  If you find a minor scrape,  cut, or break in the skin on your feet, keep it and the skin around it clean and dry. These areas may be cleansed with mild soap and water. Do not cleanse the area with peroxide, alcohol, or iodine.  When you remove an adhesive bandage, be sure not to damage the skin around it.  If you have a wound, look at it several times a day to make sure it is healing.  Do not use heating pads or hot water bottles. They may burn your skin. If you have lost feeling in your feet or legs, you may not know it is happening until it is too late.  Make sure your health care provider performs a complete foot exam at least annually or more often if you have foot problems. Report any cuts, sores, or bruises to your health care provider immediately. SEEK MEDICAL CARE IF:   You have an injury that is not healing.  You have cuts or breaks in the skin.  You have an ingrown nail.  You notice redness on your legs or feet.  You feel burning or tingling in your legs or feet.  You have pain or cramps in your legs and feet.  Your legs or feet are numb.  Your feet always feel cold. SEEK IMMEDIATE MEDICAL CARE IF:   There is increasing redness,   swelling, or pain in or around a wound.  There is a red line that goes up your leg.  Pus is coming from a wound.  You develop a fever or as directed by your health care provider.  You notice a bad smell coming from an ulcer or wound. Document Released: 09/28/2000 Document Revised: 06/03/2013 Document Reviewed: 03/10/2013 [employer] Patient Information 2015 [employer]. This information is not intended to replace advice given to you by your health care provider. Make sure you discuss any questions you have with your health care provider.  

## 2014-06-10 NOTE — Progress Notes (Signed)
Subjective:     Patient ID: Marvin Bishop, male   DOB: 1919/12/07, 80 y.o.   MRN: [id number]  HPI patient presents with thick yellow brittle nailbeds 1-5 both feet that are painful and he cannot cut   Review of Systems     Objective:   Physical Exam Neurovascular status unchanged with thick yellow brittle nailbeds 1-5 both feet that are painful    Assessment:     Mycotic nail infection is with pain 1-5 both feet    Plan:     Debride painful nailbeds 1-5 both feet with no iatrogenic bleeding noted

## 2014-06-10 NOTE — Progress Notes (Signed)
   Subjective:    Patient ID: Marvin Bishop, male    DOB: 03/07/1920, 80 y.o.   MRN: [id number]  HPI Pt presents fro debridement  Review of Systems     Objective:   Physical Exam        Assessment & Plan:

## 2014-08-23 ENCOUNTER — Emergency Department (HOSPITAL_COMMUNITY): Payer: Medicare FFS

## 2014-08-23 ENCOUNTER — Observation Stay (HOSPITAL_COMMUNITY)
Admission: EM | Admit: 2014-08-23 | Discharge: 2014-08-25 | Disposition: A | Discharge status: Home Health | Payer: Medicare FFS | Attending: Internal Medicine | Admitting: Internal Medicine

## 2014-08-23 ENCOUNTER — Encounter (HOSPITAL_COMMUNITY): Payer: Self-pay | Admitting: *Deleted

## 2014-08-23 DIAGNOSIS — Z87891 Personal history of nicotine dependence: Secondary | ICD-10-CM | POA: Insufficient documentation

## 2014-08-23 DIAGNOSIS — N400 Enlarged prostate without lower urinary tract symptoms: Secondary | ICD-10-CM | POA: Insufficient documentation

## 2014-08-23 DIAGNOSIS — K219 Gastro-esophageal reflux disease without esophagitis: Secondary | ICD-10-CM | POA: Insufficient documentation

## 2014-08-23 DIAGNOSIS — Z7982 Long term (current) use of aspirin: Secondary | ICD-10-CM | POA: Insufficient documentation

## 2014-08-23 DIAGNOSIS — R109 Unspecified abdominal pain: Secondary | ICD-10-CM | POA: Diagnosis present

## 2014-08-23 DIAGNOSIS — R112 Nausea with vomiting, unspecified: Secondary | ICD-10-CM | POA: Insufficient documentation

## 2014-08-23 DIAGNOSIS — E119 Type 2 diabetes mellitus without complications: Secondary | ICD-10-CM | POA: Diagnosis not present

## 2014-08-23 DIAGNOSIS — E1159 Type 2 diabetes mellitus with other circulatory complications: Secondary | ICD-10-CM | POA: Diagnosis present

## 2014-08-23 DIAGNOSIS — E780 Pure hypercholesterolemia: Secondary | ICD-10-CM | POA: Insufficient documentation

## 2014-08-23 DIAGNOSIS — I10 Essential (primary) hypertension: Secondary | ICD-10-CM | POA: Diagnosis not present

## 2014-08-23 DIAGNOSIS — Z794 Long term (current) use of insulin: Secondary | ICD-10-CM

## 2014-08-23 DIAGNOSIS — F0390 Unspecified dementia without behavioral disturbance: Secondary | ICD-10-CM | POA: Diagnosis not present

## 2014-08-23 DIAGNOSIS — Z79899 Other long term (current) drug therapy: Secondary | ICD-10-CM | POA: Diagnosis not present

## 2014-08-23 DIAGNOSIS — K567 Ileus, unspecified: Secondary | ICD-10-CM | POA: Diagnosis present

## 2014-08-23 DIAGNOSIS — Z79891 Long term (current) use of opiate analgesic: Secondary | ICD-10-CM | POA: Diagnosis not present

## 2014-08-23 DIAGNOSIS — R1011 Right upper quadrant pain: Secondary | ICD-10-CM | POA: Diagnosis not present

## 2014-08-23 DIAGNOSIS — I152 Hypertension secondary to endocrine disorders: Secondary | ICD-10-CM | POA: Diagnosis present

## 2014-08-23 DIAGNOSIS — Z8639 Personal history of other endocrine, nutritional and metabolic disease: Secondary | ICD-10-CM

## 2014-08-23 LAB — CBC
HCT: 36.8 % — ABNORMAL LOW (ref 39.0–52.0)
HEMOGLOBIN: 12.6 g/dL — AB (ref 13.0–17.0)
MCH: 27.6 pg (ref 26.0–34.0)
MCHC: 34.2 g/dL (ref 30.0–36.0)
MCV: 80.5 fL (ref 78.0–100.0)
Platelets: 196 10*3/uL (ref 150–400)
RBC: 4.57 MIL/uL (ref 4.22–5.81)
RDW: 14.5 % (ref 11.5–15.5)
WBC: 6.3 10*3/uL (ref 4.0–10.5)

## 2014-08-23 LAB — CBC WITH DIFFERENTIAL/PLATELET
BASOS ABS: 0 10*3/uL (ref 0.0–0.1)
BASOS PCT: 0 % (ref 0–1)
EOS PCT: 0 % (ref 0–5)
Eosinophils Absolute: 0 10*3/uL (ref 0.0–0.7)
HCT: 42.4 % (ref 39.0–52.0)
Hemoglobin: 14.5 g/dL (ref 13.0–17.0)
Lymphocytes Relative: 4 % — ABNORMAL LOW (ref 12–46)
Lymphs Abs: 0.3 10*3/uL — ABNORMAL LOW (ref 0.7–4.0)
MCH: 27.6 pg (ref 26.0–34.0)
MCHC: 34.2 g/dL (ref 30.0–36.0)
MCV: 80.6 fL (ref 78.0–100.0)
MONO ABS: 0.5 10*3/uL (ref 0.1–1.0)
Monocytes Relative: 8 % (ref 3–12)
NEUTROS ABS: 5.4 10*3/uL (ref 1.7–7.7)
Neutrophils Relative %: 88 % — ABNORMAL HIGH (ref 43–77)
Platelets: 227 10*3/uL (ref 150–400)
RBC: 5.26 MIL/uL (ref 4.22–5.81)
RDW: 14.4 % (ref 11.5–15.5)
WBC: 6.1 10*3/uL (ref 4.0–10.5)

## 2014-08-23 LAB — DIFFERENTIAL
BASOS PCT: 0 % (ref 0–1)
Basophils Absolute: 0 10*3/uL (ref 0.0–0.1)
EOS ABS: 0 10*3/uL (ref 0.0–0.7)
Eosinophils Relative: 0 % (ref 0–5)
Lymphocytes Relative: 9 % — ABNORMAL LOW (ref 12–46)
Lymphs Abs: 0.5 10*3/uL — ABNORMAL LOW (ref 0.7–4.0)
MONO ABS: 0.6 10*3/uL (ref 0.1–1.0)
Monocytes Relative: 11 % (ref 3–12)
NEUTROS PCT: 80 % — AB (ref 43–77)
Neutro Abs: 4.8 10*3/uL (ref 1.7–7.7)

## 2014-08-23 LAB — COMPREHENSIVE METABOLIC PANEL
ALBUMIN: 3.6 g/dL (ref 3.5–5.2)
ALT: 10 U/L (ref 0–53)
ALT: 9 U/L (ref 0–53)
ANION GAP: 13 (ref 5–15)
AST: 17 U/L (ref 0–37)
AST: 17 U/L (ref 0–37)
Albumin: 3 g/dL — ABNORMAL LOW (ref 3.5–5.2)
Alkaline Phosphatase: 70 U/L (ref 39–117)
Alkaline Phosphatase: 61 U/L (ref 39–117)
Anion gap: 14 (ref 5–15)
BUN: 21 mg/dL (ref 6–23)
BUN: 23 mg/dL (ref 6–23)
CO2: 22 mEq/L (ref 19–32)
CO2: 22 mEq/L (ref 19–32)
CREATININE: 1.3 mg/dL (ref 0.50–1.35)
Calcium: 9.5 mg/dL (ref 8.4–10.5)
Calcium: 8.7 mg/dL (ref 8.4–10.5)
Chloride: 101 mEq/L (ref 96–112)
Chloride: 101 mEq/L (ref 96–112)
Creatinine, Ser: 1.37 mg/dL — ABNORMAL HIGH (ref 0.50–1.35)
GFR calc Af Amer: 53 mL/min — ABNORMAL LOW (ref >90)
GFR calc non Af Amer: 46 mL/min — ABNORMAL LOW (ref >90)
GFR calc non Af Amer: 43 mL/min — ABNORMAL LOW (ref >90)
GFR, EST AFRICAN AMERICAN: 50 mL/min — AB (ref >90)
GLUCOSE: 114 mg/dL — AB (ref 70–99)
Glucose, Bld: 143 mg/dL — ABNORMAL HIGH (ref 70–99)
POTASSIUM: 3.8 meq/L (ref 3.7–5.3)
Potassium: 3.9 mEq/L (ref 3.7–5.3)
SODIUM: 137 meq/L (ref 137–147)
Sodium: 136 mEq/L — ABNORMAL LOW (ref 137–147)
TOTAL PROTEIN: 6.6 g/dL (ref 6.0–8.3)
Total Bilirubin: 0.6 mg/dL (ref 0.3–1.2)
Total Bilirubin: 0.5 mg/dL (ref 0.3–1.2)
Total Protein: 7.5 g/dL (ref 6.0–8.3)

## 2014-08-23 LAB — URINALYSIS, ROUTINE W REFLEX MICROSCOPIC
Bilirubin Urine: NEGATIVE
Glucose, UA: NEGATIVE mg/dL
HGB URINE DIPSTICK: NEGATIVE
Ketones, ur: NEGATIVE mg/dL
LEUKOCYTES UA: NEGATIVE
Nitrite: NEGATIVE
PROTEIN: NEGATIVE mg/dL
Specific Gravity, Urine: 1.033 — ABNORMAL HIGH (ref 1.005–1.030)
Urobilinogen, UA: 0.2 mg/dL (ref 0.0–1.0)
pH: 5 (ref 5.0–8.0)

## 2014-08-23 LAB — I-STAT TROPONIN, ED: TROPONIN I, POC: 0.03 ng/mL (ref 0.00–0.08)

## 2014-08-23 LAB — GLUCOSE, CAPILLARY: Glucose-Capillary: 100 mg/dL — ABNORMAL HIGH (ref 70–99)

## 2014-08-23 LAB — LIPASE, BLOOD: Lipase: 15 U/L (ref 11–59)

## 2014-08-23 LAB — CREATININE, SERUM
CREATININE: 1.36 mg/dL — AB (ref 0.50–1.35)
GFR calc Af Amer: 50 mL/min — ABNORMAL LOW (ref >90)
GFR calc non Af Amer: 44 mL/min — ABNORMAL LOW (ref >90)

## 2014-08-23 LAB — I-STAT CG4 LACTIC ACID, ED: LACTIC ACID, VENOUS: 2.2 mmol/L (ref 0.5–2.2)

## 2014-08-23 MED ORDER — ONDANSETRON HCL 4 MG/2ML IJ SOLN
4.0000 mg | Freq: Four times a day (QID) | INTRAMUSCULAR | Status: DC | PRN
  Administered 2014-08-23: 4 mg via INTRAVENOUS
  Filled 2014-08-23: qty 2

## 2014-08-23 MED ORDER — SODIUM CHLORIDE 0.9 % IV BOLUS (SEPSIS)
250.0000 mL | Freq: Once | INTRAVENOUS | Status: AC
  Administered 2014-08-23: 250 mL via INTRAVENOUS

## 2014-08-23 MED ORDER — ASPIRIN EC 81 MG PO TBEC
81.0000 mg | DELAYED_RELEASE_TABLET | Freq: Every day | ORAL | Status: DC
  Administered 2014-08-24 – 2014-08-25 (×2): 81 mg via ORAL
  Filled 2014-08-23 (×2): qty 1

## 2014-08-23 MED ORDER — SODIUM CHLORIDE 0.9 % IV SOLN
INTRAVENOUS | Status: DC
  Administered 2014-08-23: 18:00:00 via INTRAVENOUS

## 2014-08-23 MED ORDER — IOHEXOL 300 MG/ML  SOLN
100.0000 mL | Freq: Once | INTRAMUSCULAR | Status: AC | PRN
  Administered 2014-08-23: 100 mL via INTRAVENOUS

## 2014-08-23 MED ORDER — HEPARIN SODIUM (PORCINE) 5000 UNIT/ML IJ SOLN
5000.0000 [IU] | Freq: Three times a day (TID) | INTRAMUSCULAR | Status: DC
  Administered 2014-08-23 – 2014-08-25 (×5): 5000 [IU] via SUBCUTANEOUS
  Filled 2014-08-23 (×5): qty 1

## 2014-08-23 MED ORDER — ADULT MULTIVITAMIN W/MINERALS CH: 1.0000 | ORAL_TABLET | Freq: Every day | ORAL | Status: DC

## 2014-08-23 MED ORDER — GABAPENTIN 100 MG PO CAPS
200.0000 mg | ORAL_CAPSULE | Freq: Every day | ORAL | Status: DC
  Administered 2014-08-24: 200 mg via ORAL
  Filled 2014-08-23: qty 2

## 2014-08-23 MED ORDER — DORZOLAMIDE HCL-TIMOLOL MAL 2-0.5 % OP SOLN
1.0000 [drp] | Freq: Two times a day (BID) | OPHTHALMIC | Status: DC
  Administered 2014-08-23 – 2014-08-25 (×4): 1 [drp] via OPHTHALMIC
  Filled 2014-08-23: qty 10

## 2014-08-23 MED ORDER — SIMVASTATIN 40 MG PO TABS
40.0000 mg | ORAL_TABLET | Freq: Every morning | ORAL | Status: DC
  Administered 2014-08-24 – 2014-08-25 (×2): 40 mg via ORAL
  Filled 2014-08-23 (×2): qty 1

## 2014-08-23 MED ORDER — ONDANSETRON HCL 4 MG/2ML IJ SOLN
4.0000 mg | Freq: Once | INTRAMUSCULAR | Status: AC
  Administered 2014-08-23: 4 mg via INTRAVENOUS
  Filled 2014-08-23: qty 2

## 2014-08-23 MED ORDER — PANTOPRAZOLE SODIUM 40 MG PO TBEC
80.0000 mg | DELAYED_RELEASE_TABLET | Freq: Every day | ORAL | Status: DC
  Administered 2014-08-24 – 2014-08-25 (×2): 80 mg via ORAL
  Filled 2014-08-23 (×4): qty 2

## 2014-08-23 MED ORDER — INSULIN DETEMIR 100 UNIT/ML ~~LOC~~ SOLN
4.0000 [IU] | Freq: Every day | SUBCUTANEOUS | Status: DC
  Administered 2014-08-23 – 2014-08-24 (×2): 4 [IU] via SUBCUTANEOUS
  Filled 2014-08-23 (×3): qty 0.04

## 2014-08-23 MED ORDER — IOHEXOL 300 MG/ML  SOLN
25.0000 mL | INTRAMUSCULAR | Status: AC
  Administered 2014-08-23: 25 mL via ORAL

## 2014-08-23 MED ORDER — INSULIN ASPART 100 UNIT/ML ~~LOC~~ SOLN
0.0000 [IU] | SUBCUTANEOUS | Status: DC
  Administered 2014-08-24 – 2014-08-25 (×2): 1 [IU] via SUBCUTANEOUS

## 2014-08-23 MED ORDER — MORPHINE SULFATE 2 MG/ML IJ SOLN
2.0000 mg | Freq: Once | INTRAMUSCULAR | Status: AC
  Administered 2014-08-23: 2 mg via INTRAVENOUS
  Filled 2014-08-23: qty 1

## 2014-08-23 MED ORDER — ATROPINE SULFATE 1 % OP SOLN
1.0000 [drp] | Freq: Two times a day (BID) | OPHTHALMIC | Status: DC
  Administered 2014-08-23 – 2014-08-25 (×4): 1 [drp] via OPHTHALMIC
  Filled 2014-08-23: qty 2

## 2014-08-23 MED ORDER — SODIUM CHLORIDE 0.9 % IV SOLN
INTRAVENOUS | Status: DC
  Administered 2014-08-23: 23:00:00 via INTRAVENOUS

## 2014-08-23 MED ORDER — TEMAZEPAM 15 MG PO CAPS: 30.0000 mg | ORAL_CAPSULE | Freq: Every evening | ORAL | Status: DC | PRN

## 2014-08-23 MED ORDER — ADULT MULTIVITAMIN W/MINERALS CH
1.0000 | ORAL_TABLET | Freq: Every day | ORAL | Status: DC
  Administered 2014-08-24 – 2014-08-25 (×2): 1 via ORAL
  Filled 2014-08-23 (×2): qty 1

## 2014-08-23 MED ORDER — ONDANSETRON HCL 4 MG PO TABS: 4.0000 mg | ORAL_TABLET | Freq: Four times a day (QID) | ORAL | Status: DC | PRN

## 2014-08-23 MED ORDER — INSULIN DETEMIR 100 UNIT/ML FLEXPEN: 4.0000 [IU] | PEN_INJECTOR | Freq: Every day | SUBCUTANEOUS | Status: DC

## 2014-08-23 NOTE — Progress Notes (Signed)
Pt arrived to floor via stretcher. Pt calm and cooperative. Oriented to room and equipment. Bed alarm on. Will continue to monitor.

## 2014-08-23 NOTE — ED Notes (Signed)
Pt sent from pcp for RUQ pain and nausea.  Has not eaten since yesterday afternoon.  Hx of dementia and lives alone.

## 2014-08-23 NOTE — ED Notes (Signed)
Ct notified, pt done with contrast

## 2014-08-23 NOTE — ED Notes (Signed)
Called [facility] in [facility], will add I-stat Lactic Acid from blood already sent.

## 2014-08-23 NOTE — Progress Notes (Signed)
Unit CM UR Completed by [facility] CM  W. Kennith Morss RN  

## 2014-08-23 NOTE — H&P (Signed)
Hospitalist Admission History and Physical  Patient name: Marvin Bishop Medical record number: [id number] Date of birth: 1920/05/07 Age: 80 y.o. Gender: male  Primary Care Provider: [name], MD  Chief Complaint: ileus  History of Present Illness:This is a 80 y.o. year old male with significant past medical history of HTN, DM, dementia presenting with ileus. Pt currently lives at home alone. Has twice day caregivers. Family reports pt with abd pain, NBNB emesis today. Went to PCPs office w/ sxs. Was redirected to the ER.  Temp 98.6, HR 100s, resp 10s-20s, BP 90s-130s, Satting 99% on RA. WBC 6, Hgb 14.5, Cr 1.3. Lactate 2.2, lipase 15. Trop neg. UA showing elevated spec grav, otherwise WNL.   CT abd and pelvis shows Several air in fluid-filled small bowel loops at the upper limits of normal in diameter over the mid to lower abdomen. Findings may be seen in a viral gastroenteritis versus ileus or early/partial obstructive process. CXR WNL.   Assessment and Plan: Marvin Bishop is a 80 y.o. year old male presenting with ileus   Active Problems:   Ileus   1- Ileus  -observe overnight -NPO  -IV fluids-clinically dry on exam  -antiemetics -KUB in am  -surgery consult as clinically indication -no indication for abx currently-follow closely   2- HTN -LLN BPs on presentation  -dry on exam  -HOLD BP meds  -gently hydrate   3- DM  -1/5 home levemir dose given NPO status  -SSI  -A1C  4- Dementia -Stable    FEN/GI: NPO  Prophylaxis: sub q heparin  Disposition: pending further evaluation  Code Status:Full Code    Patient Active Problem List   Diagnosis Date Noted  . Ileus 08/23/2014  . Dementia, unspecified, without behavioral disturbance 09/02/2013  . Renal calculus or stone 08/22/2013  . Hypokalemia 08/22/2013  . Hypertension 08/22/2013  . Diabetes mellitus 08/22/2013  . Dehydration 08/22/2013  . Memory loss 04/23/2013  . Insomnia   . Carpal tunnel syndrome   .  Diverticulosis    Past Medical History: Past Medical History  Diagnosis Date  . Arthritis   . Diabetes mellitus   . Hypertension   . Hypercholesteremia   . Insomnia     occasional  . Nephrolithiasis     right UVJ stone  . Dementia   . Prostate cancer   . Memory loss   . Headache(784.0)     slight  . Asthma     hx of as child  . PONV (postoperative nausea and vomiting)   . Complete loss of vision     right eye  . Peripheral neuropathy     both legs    Past Surgical History: Past Surgical History  Procedure Laterality Date  . Appendectomy    . Pars plana vitrectomy  10/24/2011    Procedure: PARS PLANA VITRECTOMY WITH 23 GAUGE;  Surgeon: Adonis Brook, MD;  Location: [facility];  Service: Ophthalmology;  Laterality: Right;  . Tonsillectomy  as teen  . Knee arthroscopy Right   . Hemorroidectomy    . Cystoscopy w/ ureteral stent placement Right 10/01/2013    Procedure: cystoscopy right retrograde holmium laser lithotripsy;  Surgeon: Franchot Gallo, MD;  Location: [facility];  Service: Urology;  Laterality: Right;  holmium laser application    Social History: History   Social History  . Marital Status: Widowed    Spouse Name: N/A    Number of Children: 5  . Years of Education: 6 th   Occupational History  .  Retired   Social History Main Topics  . Smoking status: Former Smoker -- 0.25 packs/day    Types: Cigarettes    Quit date: 10/15/1966  . Smokeless tobacco: Never Used  . Alcohol Use: 0.0 oz/week     Comment: occasional  . Drug Use: No  . Sexual Activity: None   Other Topics Concern  . None   Social History Narrative   Patient lives at home alone with dog.   Retired.   Education.- 6 th  Grade   Right handed.   Caffeine-Soda , tea and coffee                Family History: Family History  Problem Relation Age of Onset  . Cancer Mother     uterus  . Heart Problems Father   . Cancer Brother     stomach  . Cancer Brother     throat  . Colon  cancer Brother     Allergies: Allergies  Allergen Reactions  . Donepezil     Medication listed but no reaction stated; family not sure of reaction    Current Facility-Administered Medications  Medication Dose Route Frequency Provider Last Rate Last Dose  . 0.9 %  sodium chloride infusion   Intravenous Continuous Fredia Sorrow, MD 100 mL/hr at 08/23/14 1937    . 0.9 %  sodium chloride infusion   Intravenous Continuous Shanda Howells, MD      . aspirin EC tablet 81 mg  81 mg Oral Daily Shanda Howells, MD      . atropine 1 % ophthalmic solution 1 drop  1 drop Right Eye BID Shanda Howells, MD      . dorzolamide-timolol (COSOPT) 22.3-6.8 MG/ML ophthalmic solution 1 drop  1 drop Right Eye BID Shanda Howells, MD      . gabapentin (NEURONTIN) capsule 200 mg  200 mg Oral QHS Shanda Howells, MD      . heparin injection 5,000 Units  5,000 Units Subcutaneous 3 times per day Shanda Howells, MD      . insulin aspart (novoLOG) injection 0-9 Units  0-9 Units Subcutaneous 6 times per day Shanda Howells, MD      . Insulin Detemir (LEVEMIR) FlexPen 4 Units  4 Units Subcutaneous Q2200 Shanda Howells, MD      . multivitamin with minerals tablet 1 tablet  1 tablet Oral Daily Shanda Howells, MD      . ondansetron [facility]) tablet 4 mg  4 mg Oral Q6H PRN Shanda Howells, MD       Or  . ondansetron [facility]) injection 4 mg  4 mg Intravenous Q6H PRN Shanda Howells, MD      . Derrill Memo ON 08/24/2014] pantoprazole (PROTONIX) EC tablet 80 mg  80 mg Oral Q1200 Shanda Howells, MD      . Derrill Memo ON 08/24/2014] simvastatin (ZOCOR) tablet 40 mg  40 mg Oral q morning - 10a Shanda Howells, MD      . temazepam (RESTORIL) capsule 30 mg  30 mg Oral QHS PRN Shanda Howells, MD       Current Outpatient Prescriptions  Medication Sig Dispense Refill  . aspirin EC 81 MG tablet Take 81 mg by mouth daily.    Marland Kitchen atropine 1 % ophthalmic solution Place 1 drop into the right eye 2 (two) times daily.   4  . dorzolamide-timolol (COSOPT) 22.3-6.8 MG/ML  ophthalmic solution Place 1 drop into the right eye 2 (two) times daily.     . DUREZOL 0.05 % EMUL Place  1 drop into the right eye 2 (two) times daily.     Marland Kitchen esomeprazole (NEXIUM) 40 MG capsule Take 40 mg by mouth daily at 12 noon.    . furosemide (LASIX) 20 MG tablet Take 20 mg by mouth every morning.     . gabapentin (NEURONTIN) 100 MG capsule Take 200 mg by mouth at bedtime.    . Ginkgo Biloba (GINKGO PO) Take 2 capsules by mouth at bedtime. Gingko Smart Vitamin softgels    . glipiZIDE (GLUCOTROL) 10 MG tablet Take 10 mg by mouth 2 (two) times daily before a meal.     . HYDROcodone-acetaminophen (NORCO/VICODIN) 5-325 MG per tablet Take 1 tablet by mouth every 6 (six) hours as needed for moderate pain.    Marland Kitchen latanoprost (XALATAN) 0.005 % ophthalmic solution Place 1 drop into the right eye at bedtime.     Marland Kitchen LEVEMIR FLEXTOUCH 100 UNIT/ML Pen Inject 10 Units into the skin daily at 10 pm.     . linagliptin (TRADJENTA) 5 MG TABS tablet Take 5 mg by mouth daily.    Marland Kitchen lisinopril (PRINIVIL,ZESTRIL) 2.5 MG tablet Take 2.5 mg by mouth every morning.     . meloxicam (MOBIC) 7.5 MG tablet Take 7.5 mg by mouth daily as needed for pain.    . Multiple Vitamin (MULTIVITAMIN WITH MINERALS) TABS tablet Take 1 tablet by mouth daily.    . sildenafil (VIAGRA) 50 MG tablet Take 50 mg by mouth daily as needed for erectile dysfunction.    . simvastatin (ZOCOR) 40 MG tablet Take 40 mg by mouth every morning.     . tamsulosin (FLOMAX) 0.4 MG CAPS capsule Take 0.4 mg by mouth daily after breakfast.    . temazepam (RESTORIL) 30 MG capsule Take 30 mg by mouth at bedtime as needed for sleep.    . metroNIDAZOLE (FLAGYL) 500 MG tablet Take 1 tablet (500 mg total) by mouth 2 (two) times daily. One po bid x 7 days 14 tablet 0   Review Of Systems: 12 point ROS negative except as noted above in HPI.  Physical Exam: Filed Vitals:   08/23/14 2019  BP: 134/58  Pulse: 103  Temp: 98.3 F (36.8 C)  Resp: 15    General:  cooperative HEENT: PERRLA and extra ocular movement intact Heart: S1, S2 normal, no murmur, rub or gallop, regular rate and rhythm Lungs: clear to auscultation, no wheezes or rales and unlabored breathing Abdomen: high pitched bowel sounds, non tender to exam  Extremities: extremities normal, atraumatic, no cyanosis or edema Skin:no rashes Neurology: normal without focal findings  Labs and Imaging: Lab Results  Component Value Date/Time   NA 137 08/23/2014 04:16 PM   K 3.9 08/23/2014 04:16 PM   CL 101 08/23/2014 04:16 PM   CO2 22 08/23/2014 04:16 PM   BUN 21 08/23/2014 04:16 PM   CREATININE 1.30 08/23/2014 04:16 PM   GLUCOSE 143* 08/23/2014 04:16 PM   Lab Results  Component Value Date   WBC 6.1 08/23/2014   HGB 14.5 08/23/2014   HCT 42.4 08/23/2014   MCV 80.6 08/23/2014   PLT 227 08/23/2014   Urinalysis    Component Value Date/Time   COLORURINE YELLOW 08/23/2014 1933   APPEARANCEUR CLEAR 08/23/2014 1933   LABSPEC 1.033* 08/23/2014 1933   PHURINE 5.0 08/23/2014 1933   GLUCOSEU NEGATIVE 08/23/2014 1933   HGBUR NEGATIVE 08/23/2014 1933   BILIRUBINUR NEGATIVE 08/23/2014 [place] NEGATIVE 08/23/2014 [place] NEGATIVE 08/23/2014 1933   UROBILINOGEN  0.2 08/23/2014 1933   NITRITE NEGATIVE 08/23/2014 1933   LEUKOCYTESUR NEGATIVE 08/23/2014 1933       Dg Chest 2 View  08/23/2014   CLINICAL DATA:  Abdominal pain, right upper quadrant with nausea.  EXAM: CHEST  2 VIEW  COMPARISON:  05/19/2014  FINDINGS: Normal heart size. Negative aortic and hilar contours. Hyperinflation and interstitial coarsening, possible COPD. There is no edema, consolidation, effusion, or pneumothorax.  IMPRESSION: No active cardiopulmonary disease.   Electronically Signed   By: Jorje Guild M.D.   On: 08/23/2014 17:46   Ct Abdomen Pelvis W Contrast  08/23/2014   CLINICAL DATA:  Right upper quadrant pain and nausea.  EXAM: CT ABDOMEN AND PELVIS WITH CONTRAST  TECHNIQUE: Multidetector CT  imaging of the abdomen and pelvis was performed using the standard protocol following bolus administration of intravenous contrast.  CONTRAST:  198mL OMNIPAQUE IOHEXOL 300 MG/ML  SOLN  COMPARISON:  05/19/2014, 08/22/2013 and 02/10/2010.  FINDINGS: Lung bases demonstrate minimal linear scarring over the right base. No change in a well-defined 7.5 cm hypodense mass over the lateral segment of the left lobe of the liver with patchy enhancement likely an atypical hemangioma. Remainder of the liver is unremarkable. The spleen, pancreas, gallbladder and adrenal glands are within normal. Kidneys normal in size without hydronephrosis or nephrolithiasis. There are least 3 left renal cysts without significant change. Ureters are within normal. Prior appendectomy. Mild diverticulosis of the colon. Resolution of previously noted wall thickening over the distal descending/sigmoid colon. No free fluid or focal inflammatory change is present. Mild atherosclerotic plaque involving the abdominal aorta and iliac arteries. There are several fluid in air-filled mildly prominent small bowel loops in the mid to lower abdomen measuring up to 2.9 cm in diameter.  Pelvic images demonstrate the bladder and rectum to be within normal. There are 3 metallic markers over the prostate unchanged. There are moderate degenerative changes of the spine and hips.  IMPRESSION: Several air in fluid-filled small bowel loops at the upper limits of normal in diameter over the mid to lower abdomen. Findings may be seen in a viral gastroenteritis versus ileus or early/partial obstructive process. Recommend serial follow-up abdominal films as clinically indicated.  Stable 7.5 cm mass over the lateral stable of the left lobe of the liver likely an atypical hemangioma.  Stable left renal cysts.  Diverticulosis of the colon.   Electronically Signed   By: Marin Olp M.D.   On: 08/23/2014 19:34           Shanda Howells MD  Pager: [phone]

## 2014-08-23 NOTE — ED Notes (Signed)
Attempted report x1. 

## 2014-08-23 NOTE — ED Provider Notes (Signed)
CSN: [id number]     Arrival date & time 08/23/14  1536 History   First MD Initiated Contact with Patient 08/23/14 1650     Chief Complaint  Patient presents with  . Abdominal Pain  . Nausea     (Consider location/radiation/quality/duration/timing/severity/associated sxs/prior Treatment) Patient is a 80 y.o. male presenting with abdominal pain. The history is provided by the patient and a relative. The history is limited by the condition of the patient.  Abdominal Pain Associated symptoms: nausea and vomiting   Associated symptoms: no fever   level V caveat applies due to the patient's history of dementia.  Most of the history obtained by the patient's daughter. Onset of abdominal pain yesterday with nausea and vomiting. Has continued into today. Pain is getting worse. Patient points to his lower part of his abdomen as the cause of the pain.  Past Medical History  Diagnosis Date  . Arthritis   . Diabetes mellitus   . Hypertension   . Hypercholesteremia   . Insomnia     occasional  . Nephrolithiasis     right UVJ stone  . Dementia   . Prostate cancer   . Memory loss   . Headache(784.0)     slight  . Asthma     hx of as child  . PONV (postoperative nausea and vomiting)   . Complete loss of vision     right eye  . Peripheral neuropathy     both legs   Past Surgical History  Procedure Laterality Date  . Appendectomy    . Pars plana vitrectomy  10/24/2011    Procedure: PARS PLANA VITRECTOMY WITH 23 GAUGE;  Surgeon: Adonis Brook, MD;  Location: [facility];  Service: Ophthalmology;  Laterality: Right;  . Tonsillectomy  as teen  . Knee arthroscopy Right   . Hemorroidectomy    . Cystoscopy w/ ureteral stent placement Right 10/01/2013    Procedure: cystoscopy right retrograde holmium laser lithotripsy;  Surgeon: Franchot Gallo, MD;  Location: [facility];  Service: Urology;  Laterality: Right;  holmium laser application   Family History  Problem Relation Age of Onset  . Cancer  Mother     uterus  . Heart Problems Father   . Cancer Brother     stomach  . Cancer Brother     throat  . Colon cancer Brother    History  Substance Use Topics  . Smoking status: Former Smoker -- 0.25 packs/day    Types: Cigarettes    Quit date: 10/15/1966  . Smokeless tobacco: Never Used  . Alcohol Use: 0.0 oz/week     Comment: occasional    Review of Systems  Unable to perform ROS Constitutional: Negative for fever.  HENT: Negative for congestion.   Gastrointestinal: Positive for nausea, vomiting and abdominal pain.  Hematological: Does not bruise/bleed easily.  Psychiatric/Behavioral: Positive for confusion.  level V caveat applies the rest of review of systems patient has a history of dementia.    Allergies  Donepezil  Home Medications   Prior to Admission medications   Medication Sig Start Date End Date Taking? Authorizing Provider  aspirin EC 81 MG tablet Take 81 mg by mouth daily.   Yes Historical Provider, MD  atropine 1 % ophthalmic solution Place 1 drop into the right eye 2 (two) times daily.  08/18/14  Yes Historical Provider, MD  dorzolamide-timolol (COSOPT) 22.3-6.8 MG/ML ophthalmic solution Place 1 drop into the right eye 2 (two) times daily.  02/05/13  Yes Historical Provider, MD  DUREZOL 0.05 % EMUL Place 1 drop into the right eye 2 (two) times daily.  08/24/13  Yes Historical Provider, MD  esomeprazole (NEXIUM) 40 MG capsule Take 40 mg by mouth daily at 12 noon.   Yes Historical Provider, MD  furosemide (LASIX) 20 MG tablet Take 20 mg by mouth every morning.    Yes Historical Provider, MD  gabapentin (NEURONTIN) 100 MG capsule Take 200 mg by mouth at bedtime.   Yes Historical Provider, MD  Ginkgo Biloba (GINKGO PO) Take 2 capsules by mouth at bedtime. Gingko Smart Vitamin softgels   Yes Historical Provider, MD  glipiZIDE (GLUCOTROL) 10 MG tablet Take 10 mg by mouth 2 (two) times daily before a meal.    Yes Historical Provider, MD  HYDROcodone-acetaminophen  (NORCO/VICODIN) 5-325 MG per tablet Take 1 tablet by mouth every 6 (six) hours as needed for moderate pain.   Yes Historical Provider, MD  latanoprost (XALATAN) 0.005 % ophthalmic solution Place 1 drop into the right eye at bedtime.  02/05/13  Yes Historical Provider, MD  LEVEMIR FLEXTOUCH 100 UNIT/ML Pen Inject 10 Units into the skin daily at 10 pm.  02/09/14  Yes Historical Provider, MD  linagliptin (TRADJENTA) 5 MG TABS tablet Take 5 mg by mouth daily.   Yes Historical Provider, MD  lisinopril (PRINIVIL,ZESTRIL) 2.5 MG tablet Take 2.5 mg by mouth every morning.    Yes Historical Provider, MD  meloxicam (MOBIC) 7.5 MG tablet Take 7.5 mg by mouth daily as needed for pain.   Yes Historical Provider, MD  Multiple Vitamin (MULTIVITAMIN WITH MINERALS) TABS tablet Take 1 tablet by mouth daily.   Yes Historical Provider, MD  sildenafil (VIAGRA) 50 MG tablet Take 50 mg by mouth daily as needed for erectile dysfunction.   Yes Historical Provider, MD  simvastatin (ZOCOR) 40 MG tablet Take 40 mg by mouth every morning.    Yes Historical Provider, MD  tamsulosin (FLOMAX) 0.4 MG CAPS capsule Take 0.4 mg by mouth daily after breakfast.   Yes Historical Provider, MD  temazepam (RESTORIL) 30 MG capsule Take 30 mg by mouth at bedtime as needed for sleep.   Yes Historical Provider, MD  metroNIDAZOLE (FLAGYL) 500 MG tablet Take 1 tablet (500 mg total) by mouth 2 (two) times daily. One po bid x 7 days 05/20/14   Margarita Mail, PA-C   BP 134/58 mmHg  Pulse 103  Temp(Src) 98.3 F (36.8 C) (Oral)  Resp 15  SpO2 100% Physical Exam  Constitutional: He appears well-developed and well-nourished. No distress.  HENT:  Head: Normocephalic and atraumatic.  Mouth/Throat: Oropharynx is clear and moist.  Eyes: Conjunctivae and EOM are normal. Pupils are equal, round, and reactive to light.  Neck: Normal range of motion.  Cardiovascular: Normal rate, regular rhythm and intact distal pulses.   Pulmonary/Chest: Effort normal  and breath sounds normal. He has no wheezes.  Abdominal: Soft. Bowel sounds are normal. He exhibits no mass. There is no tenderness.  Musculoskeletal: Normal range of motion.  Neurological: He is alert. No cranial nerve deficit. He exhibits normal muscle tone. Coordination normal.  Skin: Skin is warm. No rash noted.  Nursing note and vitals reviewed.   ED Course  Procedures (including critical care time) Labs Review Labs Reviewed  CBC WITH DIFFERENTIAL - Abnormal; Notable for the following:    Neutrophils Relative % 88 (*)    Lymphocytes Relative 4 (*)    Lymphs Abs 0.3 (*)    All other components within normal limits  COMPREHENSIVE  METABOLIC PANEL - Abnormal; Notable for the following:    Glucose, Bld 143 (*)    GFR calc non Af Amer 46 (*)    GFR calc Af Amer 53 (*)    All other components within normal limits  URINALYSIS, ROUTINE W REFLEX MICROSCOPIC - Abnormal; Notable for the following:    Specific Gravity, Urine 1.033 (*)    All other components within normal limits  LIPASE, BLOOD  I-STAT TROPOININ, ED  I-STAT CG4 LACTIC ACID, ED   Results for orders placed or performed during the hospital encounter of 08/23/14  CBC with Differential  Result Value Ref Range   WBC 6.1 4.0 - 10.5 K/uL   RBC 5.26 4.22 - 5.81 MIL/uL   Hemoglobin 14.5 13.0 - 17.0 g/dL   HCT 42.4 39.0 - 52.0 %   MCV 80.6 78.0 - 100.0 fL   MCH 27.6 26.0 - 34.0 pg   MCHC 34.2 30.0 - 36.0 g/dL   RDW 14.4 11.5 - 15.5 %   Platelets 227 150 - 400 K/uL   Neutrophils Relative % 88 (H) 43 - 77 %   Neutro Abs 5.4 1.7 - 7.7 K/uL   Lymphocytes Relative 4 (L) 12 - 46 %   Lymphs Abs 0.3 (L) 0.7 - 4.0 K/uL   Monocytes Relative 8 3 - 12 %   Monocytes Absolute 0.5 0.1 - 1.0 K/uL   Eosinophils Relative 0 0 - 5 %   Eosinophils Absolute 0.0 0.0 - 0.7 K/uL   Basophils Relative 0 0 - 1 %   Basophils Absolute 0.0 0.0 - 0.1 K/uL  Comprehensive metabolic panel  Result Value Ref Range   Sodium 137 137 - 147 mEq/L    Potassium 3.9 3.7 - 5.3 mEq/L   Chloride 101 96 - 112 mEq/L   CO2 22 19 - 32 mEq/L   Glucose, Bld 143 (H) 70 - 99 mg/dL   BUN 21 6 - 23 mg/dL   Creatinine, Ser 1.30 0.50 - 1.35 mg/dL   Calcium 9.5 8.4 - 10.5 mg/dL   Total Protein 7.5 6.0 - 8.3 g/dL   Albumin 3.6 3.5 - 5.2 g/dL   AST 17 0 - 37 U/L   ALT 10 0 - 53 U/L   Alkaline Phosphatase 70 39 - 117 U/L   Total Bilirubin 0.6 0.3 - 1.2 mg/dL   GFR calc non Af Amer 46 (L) >90 mL/min   GFR calc Af Amer 53 (L) >90 mL/min   Anion gap 14 5 - 15  Lipase, blood  Result Value Ref Range   Lipase 15 11 - 59 U/L  Urinalysis, Routine w reflex microscopic  Result Value Ref Range   Color, Urine YELLOW YELLOW   APPearance CLEAR CLEAR   Specific Gravity, Urine 1.033 (H) 1.005 - 1.030   pH 5.0 5.0 - 8.0   Glucose, UA NEGATIVE NEGATIVE mg/dL   Hgb urine dipstick NEGATIVE NEGATIVE   Bilirubin Urine NEGATIVE NEGATIVE   Ketones, ur NEGATIVE NEGATIVE mg/dL   Protein, ur NEGATIVE NEGATIVE mg/dL   Urobilinogen, UA 0.2 0.0 - 1.0 mg/dL   Nitrite NEGATIVE NEGATIVE   Leukocytes, UA NEGATIVE NEGATIVE  I-stat troponin, ED (only if pt is 78 y.o. or older & pain is above umbilicus) - do not order at [facility]  Result Value Ref Range   Troponin i, poc 0.03 0.00 - 0.08 ng/mL   Comment 3          I-Stat CG4 Lactic Acid, ED  Result Value Ref  Range   Lactic Acid, Venous 2.20 0.5 - 2.2 mmol/L     Imaging Review Dg Chest 2 View  08/23/2014   CLINICAL DATA:  Abdominal pain, right upper quadrant with nausea.  EXAM: CHEST  2 VIEW  COMPARISON:  05/19/2014  FINDINGS: Normal heart size. Negative aortic and hilar contours. Hyperinflation and interstitial coarsening, possible COPD. There is no edema, consolidation, effusion, or pneumothorax.  IMPRESSION: No active cardiopulmonary disease.   Electronically Signed   By: Jorje Guild M.D.   On: 08/23/2014 17:46   Ct Abdomen Pelvis W Contrast  08/23/2014   CLINICAL DATA:  Right upper quadrant pain and nausea.  EXAM: CT  ABDOMEN AND PELVIS WITH CONTRAST  TECHNIQUE: Multidetector CT imaging of the abdomen and pelvis was performed using the standard protocol following bolus administration of intravenous contrast.  CONTRAST:  164mL OMNIPAQUE IOHEXOL 300 MG/ML  SOLN  COMPARISON:  05/19/2014, 08/22/2013 and 02/10/2010.  FINDINGS: Lung bases demonstrate minimal linear scarring over the right base. No change in a well-defined 7.5 cm hypodense mass over the lateral segment of the left lobe of the liver with patchy enhancement likely an atypical hemangioma. Remainder of the liver is unremarkable. The spleen, pancreas, gallbladder and adrenal glands are within normal. Kidneys normal in size without hydronephrosis or nephrolithiasis. There are least 3 left renal cysts without significant change. Ureters are within normal. Prior appendectomy. Mild diverticulosis of the colon. Resolution of previously noted wall thickening over the distal descending/sigmoid colon. No free fluid or focal inflammatory change is present. Mild atherosclerotic plaque involving the abdominal aorta and iliac arteries. There are several fluid in air-filled mildly prominent small bowel loops in the mid to lower abdomen measuring up to 2.9 cm in diameter.  Pelvic images demonstrate the bladder and rectum to be within normal. There are 3 metallic markers over the prostate unchanged. There are moderate degenerative changes of the spine and hips.  IMPRESSION: Several air in fluid-filled small bowel loops at the upper limits of normal in diameter over the mid to lower abdomen. Findings may be seen in a viral gastroenteritis versus ileus or early/partial obstructive process. Recommend serial follow-up abdominal films as clinically indicated.  Stable 7.5 cm mass over the lateral stable of the left lobe of the liver likely an atypical hemangioma.  Stable left renal cysts.  Diverticulosis of the colon.   Electronically Signed   By: Marin Olp M.D.   On: 08/23/2014 19:34      EKG Interpretation   Date/Time:  Monday August 23 2014 16:03:02 EST Ventricular Rate:  118 PR Interval:  156 QRS Duration: 94 QT Interval:  326 QTC Calculation: 456 R Axis:   -14 Text Interpretation:  Sinus tachycardia Incomplete right bundle branch  block Borderline ECG No significant change since last tracing Confirmed by  Bhavana Kady  MD, Mallarie Voorhies ([id number]) on 08/23/2014 5:03:29 PM      MDM   Final diagnoses:  Abdominal pain    Patient with persistent tachycardia. Abdominal pain CT scan suggestive of possible early small bowel obstruction. Patient has a history of dementia. Lives alone but does have family members nearby people to check on him. It sounds as if the pain has been present since yesterday. Associated with nausea and vomiting no diarrhea. Patient states that the pain is in both lower quadrants. Nursing note suggested upper quadrants. No leukocytosis no liver function test abnormalities no evidence of pancreatitis based on the lipase. Urinalysis still pending. Chest x-ray had no acute disease. The findings on  the CT scan could be related to an ileus and/or to a gastro-enteritis type picture. But due to patient's age and the concern for developing partial small bowel obstruction admission is warranted. Patient is followed by [facility].    Fredia Sorrow, MD 08/23/14 2030

## 2014-08-24 ENCOUNTER — Observation Stay (HOSPITAL_COMMUNITY): Payer: Medicare FFS

## 2014-08-24 DIAGNOSIS — E119 Type 2 diabetes mellitus without complications: Secondary | ICD-10-CM

## 2014-08-24 LAB — GLUCOSE, CAPILLARY
GLUCOSE-CAPILLARY: 71 mg/dL (ref 70–99)
GLUCOSE-CAPILLARY: 67 mg/dL — AB (ref 70–99)
GLUCOSE-CAPILLARY: 132 mg/dL — AB (ref 70–99)
Glucose-Capillary: 72 mg/dL (ref 70–99)
Glucose-Capillary: 72 mg/dL (ref 70–99)
Glucose-Capillary: 129 mg/dL — ABNORMAL HIGH (ref 70–99)

## 2014-08-24 LAB — HEMOGLOBIN A1C
Hgb A1c MFr Bld: 7.3 % — ABNORMAL HIGH (ref <5.7)
Mean Plasma Glucose: 163 mg/dL — ABNORMAL HIGH (ref <117)

## 2014-08-24 MED ORDER — POLYETHYLENE GLYCOL 3350 17 G PO PACK
17.0000 g | PACK | Freq: Once | ORAL | Status: AC
  Administered 2014-08-24: 17 g via ORAL
  Filled 2014-08-24: qty 1

## 2014-08-24 MED ORDER — POLYETHYLENE GLYCOL 3350 17 G PO PACK
17.0000 g | PACK | Freq: Two times a day (BID) | ORAL | Status: DC
  Administered 2014-08-24 (×2): 17 g via ORAL
  Filled 2014-08-24 (×2): qty 1

## 2014-08-24 MED ORDER — TAMSULOSIN HCL 0.4 MG PO CAPS
0.4000 mg | ORAL_CAPSULE | Freq: Every day | ORAL | Status: DC
  Administered 2014-08-24 – 2014-08-25 (×2): 0.4 mg via ORAL
  Filled 2014-08-24 (×2): qty 1

## 2014-08-24 MED ORDER — GLIPIZIDE 5 MG PO TABS
10.0000 mg | ORAL_TABLET | Freq: Two times a day (BID) | ORAL | Status: DC
  Administered 2014-08-24 – 2014-08-25 (×2): 10 mg via ORAL
  Filled 2014-08-24 (×3): qty 2

## 2014-08-24 MED ORDER — PREDNISOLONE ACETATE 1 % OP SUSP
1.0000 [drp] | Freq: Two times a day (BID) | OPHTHALMIC | Status: DC
  Administered 2014-08-24 – 2014-08-25 (×3): 1 [drp] via OPHTHALMIC
  Filled 2014-08-24: qty 1

## 2014-08-24 MED ORDER — SORBITOL 70 % SOLN
960.0000 mL | TOPICAL_OIL | Freq: Once | ORAL | Status: AC
  Administered 2014-08-24: 960 mL via RECTAL
  Filled 2014-08-24: qty 240

## 2014-08-24 MED ORDER — FUROSEMIDE 20 MG PO TABS
20.0000 mg | ORAL_TABLET | Freq: Every morning | ORAL | Status: DC
  Administered 2014-08-25: 20 mg via ORAL
  Filled 2014-08-24 (×2): qty 1

## 2014-08-24 MED ORDER — BISACODYL 10 MG RE SUPP
10.0000 mg | Freq: Every day | RECTAL | Status: DC
Start: 2014-08-24 — End: 2014-08-25
  Administered 2014-08-24: 10 mg via RECTAL
  Filled 2014-08-24: qty 1

## 2014-08-24 MED ORDER — DOCUSATE SODIUM 100 MG PO CAPS
200.0000 mg | ORAL_CAPSULE | Freq: Two times a day (BID) | ORAL | Status: DC
  Administered 2014-08-24 (×2): 200 mg via ORAL
  Filled 2014-08-24 (×3): qty 2

## 2014-08-24 MED ORDER — MORPHINE SULFATE 2 MG/ML IJ SOLN
1.0000 mg | INTRAMUSCULAR | Status: DC | PRN
  Administered 2014-08-24: 2 mg via INTRAVENOUS
  Filled 2014-08-24: qty 1

## 2014-08-24 MED ORDER — LINAGLIPTIN 5 MG PO TABS
5.0000 mg | ORAL_TABLET | Freq: Every day | ORAL | Status: DC
  Administered 2014-08-24 – 2014-08-25 (×2): 5 mg via ORAL
  Filled 2014-08-24 (×3): qty 1

## 2014-08-24 MED ORDER — SODIUM CHLORIDE 0.9 % IV SOLN: INTRAVENOUS | Status: DC

## 2014-08-24 MED ORDER — DIFLUPREDNATE 0.05 % OP EMUL: 1.0000 [drp] | Freq: Two times a day (BID) | OPHTHALMIC | Status: DC

## 2014-08-24 MED ORDER — LATANOPROST 0.005 % OP SOLN
1.0000 [drp] | Freq: Every day | OPHTHALMIC | Status: DC
  Administered 2014-08-24: 1 [drp] via OPHTHALMIC
  Filled 2014-08-24: qty 2.5

## 2014-08-24 MED ORDER — GLUCERNA SHAKE PO LIQD
237.0000 mL | ORAL | Status: DC
  Administered 2014-08-24: 237 mL via ORAL

## 2014-08-24 NOTE — Progress Notes (Signed)
UR completed 

## 2014-08-24 NOTE — Progress Notes (Signed)
INITIAL NUTRITION ASSESSMENT  DOCUMENTATION CODES Per approved criteria  -Not Applicable   INTERVENTION: - Glucerna Shake po once daily, each supplement provides 220 kcal and 10 grams of protein  NUTRITION DIAGNOSIS: Inadequate oral intake related to ileus as evidenced by wt loss.   Goal: Pt to meet >/= 90% of their estimated nutrition needs   Monitor:  Weight trend, po intake, acceptance of supplements, labs  Reason for Assessment: MST  80 y.o. male  Admitting Dx: <principal problem not specified>  ASSESSMENT: 80 y.o. year old male with significant past medical history of HTN, DM, dementia presenting with ileus.  - Per MD note, ileus clinically resolved. Diet has been advanced to carb modified.  - Pt reports a 4 lb weight loss recently. He has not had anything to eat in several days. Pt feels hungry at this time.  - Nutrition-focused physical exam reveals no fat or muscle wasting.   Height: Ht Readings from Last 1 Encounters:  08/23/14 6\' 2"  (1.88 m)    Weight: Wt Readings from Last 1 Encounters:  08/23/14 208 lb 12.8 oz (94.711 kg)    Ideal Body Weight: 82.2 kg  % Ideal Body Weight: 115%  Wt Readings from Last 10 Encounters:  08/23/14 208 lb 12.8 oz (94.711 kg)  03/04/14 219 lb (99.338 kg)  09/29/13 217 lb (98.431 kg)  09/02/13 218 lb (98.884 kg)  08/22/13 221 lb 1.6 oz (100.29 kg)  08/04/13 219 lb (99.338 kg)  04/23/13 215 lb (97.523 kg)  10/23/11 228 lb (103.42 kg)    Usual Body Weight: 212 lbs  % Usual Body Weight: 98%  BMI:  Body mass index is 26.8 kg/(m^2).  Estimated Nutritional Needs: Kcal: 2200-2400 Protein: 110-120 g Fluid: 2.2-2.4 L/day  Skin: intact  Diet Order: Diet Heart  EDUCATION NEEDS: -Education needs addressed  No intake or output data in the 24 hours ending 08/24/14 1241  Last BM: prior to admission   Labs:   Recent Labs Lab 08/23/14 1616 08/23/14 2107  NA 137 136*  K 3.9 3.8  CL 101 101  CO2 22 22  BUN 21  23  CREATININE 1.30 1.37*  1.36*  CALCIUM 9.5 8.7  GLUCOSE 143* 114*    CBG (last 3)   Recent Labs  08/24/14 0827 08/24/14 1206 08/24/14 1234  GLUCAP 72 67* 72    Scheduled Meds: . aspirin EC  81 mg Oral Daily  . atropine  1 drop Right Eye BID  . bisacodyl  10 mg Rectal Daily  . docusate sodium  200 mg Oral BID  . dorzolamide-timolol  1 drop Right Eye BID  . furosemide  20 mg Oral q morning - 10a  . gabapentin  200 mg Oral QHS  . glipiZIDE  10 mg Oral BID AC  . heparin  5,000 Units Subcutaneous 3 times per day  . insulin aspart  0-9 Units Subcutaneous 6 times per day  . insulin detemir  4 Units Subcutaneous QHS  . latanoprost  1 drop Right Eye QHS  . linagliptin  5 mg Oral Daily  . multivitamin with minerals  1 tablet Oral Daily  . pantoprazole  80 mg Oral Q1200  . polyethylene glycol  17 g Oral BID  . prednisoLONE acetate  1 drop Right Eye BID  . simvastatin  40 mg Oral q morning - 10a  . sorbitol, milk of mag, mineral oil, glycerin (SMOG) enema  960 mL Rectal Once  . tamsulosin  0.4 mg Oral QPC breakfast  Continuous Infusions:   Past Medical History  Diagnosis Date  . Arthritis   . Diabetes mellitus   . Hypertension   . Hypercholesteremia   . Insomnia     occasional  . Nephrolithiasis     right UVJ stone  . Dementia   . Prostate cancer   . Memory loss   . Headache(784.0)     slight  . Asthma     hx of as child  . PONV (postoperative nausea and vomiting)   . Complete loss of vision     right eye  . Peripheral neuropathy     both legs    Past Surgical History  Procedure Laterality Date  . Appendectomy    . Pars plana vitrectomy  10/24/2011    Procedure: PARS PLANA VITRECTOMY WITH 23 GAUGE;  Surgeon: Adonis Brook, MD;  Location: [place];  Service: Ophthalmology;  Laterality: Right;  . Tonsillectomy  as teen  . Knee arthroscopy Right   . Hemorroidectomy    . Cystoscopy w/ ureteral stent placement Right 10/01/2013    Procedure: cystoscopy right  retrograde holmium laser lithotripsy;  Surgeon: Franchot Gallo, MD;  Location: [facility];  Service: Urology;  Laterality: Right;  holmium laser application    Laurette Schimke MS, RD, LDN

## 2014-08-24 NOTE — Progress Notes (Signed)
Patient Demographics  Marvin Bishop, is a 80 y.o. male, DOB - November 23, 1919, GQQ:[id number]  Admit date - 08/23/2014   Admitting Physician Shanda Howells, MD  Outpatient Primary MD for the patient is [name], MD  LOS - 1   Chief Complaint  Patient presents with  . Abdominal Pain  . Nausea        Subjective:   Marvin Bishop today has, No headache, No chest pain, No abdominal pain - No Nausea, No new weakness tingling or numbness, No Cough - SOB.    Assessment & Plan    1. Abdominal pain with nausea and vomiting due to mild ileus. Clinically resolved, passing flatness, repeat KUB stable, does have stool burden, this likely was due to comminution of narcotics and lack of activity. Patient and family counseled. Will give SMOG enema, bowel regimen, advance diet. Supportive care.   2.DM2 - continue oral hypoglycemic agents along with sliding scale insulin and monitor CBGs.  Lab Results  Component Value Date   HGBA1C 7.3* 08/23/2014    CBG (last 3)   Recent Labs  08/23/14 2301 08/24/14 0430 08/24/14 0827  GLUCAP 100* 71 72     3. Dementia. At risk for delirium. Minimize narcotics and benzos.   4. BPH. Continue Flomax.   5. GERD. On PPI continue.   6. Dyslipidemia. Home dose statin no change.     Code Status:  Full  Family Communication: daughter  Disposition Plan: TBD   Procedures CT Abd Pelvis   Consults  None   Medications  Scheduled Meds: . aspirin EC  81 mg Oral Daily  . atropine  1 drop Right Eye BID  . bisacodyl  10 mg Rectal Daily  . Difluprednate  1 drop Right Eye BID  . docusate sodium  200 mg Oral BID  . dorzolamide-timolol  1 drop Right Eye BID  . furosemide  20 mg Oral q morning - 10a  . gabapentin  200 mg Oral QHS  . glipiZIDE  10 mg Oral BID  AC  . heparin  5,000 Units Subcutaneous 3 times per day  . insulin aspart  0-9 Units Subcutaneous 6 times per day  . insulin detemir  4 Units Subcutaneous QHS  . latanoprost  1 drop Right Eye QHS  . linagliptin  5 mg Oral Daily  . multivitamin with minerals  1 tablet Oral Daily  . pantoprazole  80 mg Oral Q1200  . polyethylene glycol  17 g Oral BID  . simvastatin  40 mg Oral q morning - 10a  . sorbitol, milk of mag, mineral oil, glycerin (SMOG) enema  960 mL Rectal Once  . tamsulosin  0.4 mg Oral QPC breakfast   Continuous Infusions:  PRN Meds:.morphine injection, [DISCONTINUED] ondansetron **OR** ondansetron (ZOFRAN) IV, temazepam  DVT Prophylaxis    Heparin   Lab Results  Component Value Date   PLT 196 08/23/2014    Antibiotics     Anti-infectives    None          Objective:   Filed Vitals:   08/23/14 2220 08/24/14 0215 08/24/14 0553 08/24/14 1016  BP: 109/61 95/53 97/54  103/70  Pulse: 97 95 98 82  Temp: 99.5 F (37.5 C) 100 F (37.8 C) 98.6 F (37  C) 98.1 F (36.7 C)  TempSrc: Oral Oral Oral Oral  Resp: 20 20 18 18   Height: 6\' 2"  (1.88 m)     Weight: 94.711 kg (208 lb 12.8 oz)     SpO2: 97% 99% 98% 100%    Wt Readings from Last 3 Encounters:  08/23/14 94.711 kg (208 lb 12.8 oz)  03/04/14 99.338 kg (219 lb)  09/29/13 98.431 kg (217 lb)    No intake or output data in the 24 hours ending 08/24/14 1040   Physical Exam  Awake Alert, Oriented X 2, No new F.N deficits, Normal affect [place].AT,PERRAL Supple Neck,No JVD, No cervical lymphadenopathy appriciated.  Symmetrical Chest wall movement, Good air movement bilaterally, CTAB RRR,No Gallops,Rubs or new Murmurs, No Parasternal Heave +ve B.Sounds, Abd Soft, No tenderness, No organomegaly appriciated, No rebound - guarding or rigidity. No Cyanosis, Clubbing or edema, No new Rash or bruise      Data Review   Micro Results No results found for this or any previous visit (from the past 240  hour(s)).  Radiology Reports Dg Chest 2 View  08/23/2014   CLINICAL DATA:  Abdominal pain, right upper quadrant with nausea.  EXAM: CHEST  2 VIEW  COMPARISON:  05/19/2014  FINDINGS: Normal heart size. Negative aortic and hilar contours. Hyperinflation and interstitial coarsening, possible COPD. There is no edema, consolidation, effusion, or pneumothorax.  IMPRESSION: No active cardiopulmonary disease.   Electronically Signed   By: Jorje Guild M.D.   On: 08/23/2014 17:46   Ct Abdomen Pelvis W Contrast  08/23/2014   CLINICAL DATA:  Right upper quadrant pain and nausea.  EXAM: CT ABDOMEN AND PELVIS WITH CONTRAST  TECHNIQUE: Multidetector CT imaging of the abdomen and pelvis was performed using the standard protocol following bolus administration of intravenous contrast.  CONTRAST:  169mL OMNIPAQUE IOHEXOL 300 MG/ML  SOLN  COMPARISON:  05/19/2014, 08/22/2013 and 02/10/2010.  FINDINGS: Lung bases demonstrate minimal linear scarring over the right base. No change in a well-defined 7.5 cm hypodense mass over the lateral segment of the left lobe of the liver with patchy enhancement likely an atypical hemangioma. Remainder of the liver is unremarkable. The spleen, pancreas, gallbladder and adrenal glands are within normal. Kidneys normal in size without hydronephrosis or nephrolithiasis. There are least 3 left renal cysts without significant change. Ureters are within normal. Prior appendectomy. Mild diverticulosis of the colon. Resolution of previously noted wall thickening over the distal descending/sigmoid colon. No free fluid or focal inflammatory change is present. Mild atherosclerotic plaque involving the abdominal aorta and iliac arteries. There are several fluid in air-filled mildly prominent small bowel loops in the mid to lower abdomen measuring up to 2.9 cm in diameter.  Pelvic images demonstrate the bladder and rectum to be within normal. There are 3 metallic markers over the prostate unchanged. There  are moderate degenerative changes of the spine and hips.  IMPRESSION: Several air in fluid-filled small bowel loops at the upper limits of normal in diameter over the mid to lower abdomen. Findings may be seen in a viral gastroenteritis versus ileus or early/partial obstructive process. Recommend serial follow-up abdominal films as clinically indicated.  Stable 7.5 cm mass over the lateral stable of the left lobe of the liver likely an atypical hemangioma.  Stable left renal cysts.  Diverticulosis of the colon.   Electronically Signed   By: Marin Olp M.D.   On: 08/23/2014 19:34   Dg Abd Portable 1v  08/24/2014   CLINICAL DATA:  Pain across  upper abdomen; nausea ; constipation  EXAM: PORTABLE ABDOMEN - 1 VIEW  COMPARISON:  CT abdomen and pelvis August 23, 2014  FINDINGS: There is contrast in the colon. There is no bowel dilatation or air-fluid level to suggest obstruction on this supine examination. No free air appreciable. There is degenerative change in the lumbar spine.  IMPRESSION: Bowel gas pattern overall unremarkable on this supine examination. Contrast present throughout much of the colon. Moderate stool is noted in the colon.   Electronically Signed   By: Lowella Grip M.D.   On: 08/24/2014 08:15     CBC  Recent Labs Lab 08/23/14 1616 08/23/14 2107  WBC 6.1 6.3  HGB 14.5 12.6*  HCT 42.4 36.8*  PLT 227 196  MCV 80.6 80.5  MCH 27.6 27.6  MCHC 34.2 34.2  RDW 14.4 14.5  LYMPHSABS 0.3* 0.5*  MONOABS 0.5 0.6  EOSABS 0.0 0.0  BASOSABS 0.0 0.0    Chemistries   Recent Labs Lab 08/23/14 1616 08/23/14 2107  NA 137 136*  K 3.9 3.8  CL 101 101  CO2 22 22  GLUCOSE 143* 114*  BUN 21 23  CREATININE 1.30 1.37*  1.36*  CALCIUM 9.5 8.7  AST 17 17  ALT 10 9  ALKPHOS 70 61  BILITOT 0.6 0.5   ------------------------------------------------------------------------------------------------------------------ estimated creatinine clearance is 40.3 mL/min (by C-G formula based  on Cr of 1.36). ------------------------------------------------------------------------------------------------------------------  Recent Labs  08/23/14 2107  HGBA1C 7.3*   No results found for: CHOL, HDL, LDLCALC, LDLDIRECT, TRIG, CHOLHDL  ------------------------------------------------------------------------------------------------------------------ No results for input(s): CHOL, HDL, LDLCALC, TRIG, CHOLHDL, LDLDIRECT in the last 72 hours. ------------------------------------------------------------------------------------------------------------------ No results for input(s): TSH, T4TOTAL, T3FREE, THYROIDAB in the last 72 hours.  Invalid input(s): FREET3 ------------------------------------------------------------------------------------------------------------------ No results for input(s): VITAMINB12, FOLATE, FERRITIN, TIBC, IRON, RETICCTPCT in the last 72 hours.  Coagulation profile No results for input(s): INR, PROTIME in the last 168 hours.  No results for input(s): DDIMER in the last 72 hours.  Cardiac Enzymes No results for input(s): CKMB, TROPONINI, MYOGLOBIN in the last 168 hours.  Invalid input(s): CK ------------------------------------------------------------------------------------------------------------------ Invalid input(s): POCBNP     Time Spent in minutes 35   Suleyma Wafer K M.D on 08/24/2014 at 10:40 AM  Between 7am to 7pm - Pager - [phone]  After 7pm go to [email or web address] - password [email or web address]  And look for the night coverage person covering for me after hours  [facility] Office  [phone]

## 2014-08-24 NOTE — Evaluation (Signed)
Physical Therapy Evaluation Patient Details Name: Marvin Bishop MRN: [id number] DOB: [birth year] Today's Date: 08/24/2014   History of Present Illness  Adm 08/23/14 with ileus PMHx-dementia, DM, prostate Ca, HTN  Clinical Impression  Pt admitted with above. Pt has intermittent assist from his daughter. Pt did have one significant loss of balance (while stepping backwards and turning). Unsure that pt would benefit from use of an assistive device (with dementia, may not remember to use and likely will only get in his way). Feel pt could benefit from balance retraining. Pt currently with functional limitations due to the deficits listed below (see PT Problem List).  Pt will benefit from skilled PT to increase their independence and safety with mobility to allow discharge to the venue listed below.       Follow Up Recommendations Home health PT;Supervision for mobility/OOB    Equipment Recommendations  None recommended by PT    Recommendations for Other Services OT consult     Precautions / Restrictions Precautions Precautions: Fall Restrictions Weight Bearing Restrictions: No      Mobility  Bed Mobility Overal bed mobility: Modified Independent             General bed mobility comments: incr time to remove bed covers from his legs  Transfers Overall transfer level: Independent Equipment used: None                Ambulation/Gait Ambulation/Gait assistance: Min assist Ambulation Distance (Feet): 220 Feet Assistive device: None Gait Pattern/deviations: Step-through pattern;Decreased stride length;Antalgic;Wide base of support Gait velocity: decr with limited ability to incr velocity; able to decr significantly   General Gait Details: antalgic, however denies pain (?arthritis, and pt reports no walking since admission); initially reaching for support surfaces to hold onto; loss of balance x 1 when backing up and turning simultaneously (required min assist to  recover)  Stairs            Wheelchair Mobility    Modified Rankin (Stroke Patients Only)       Balance Overall balance assessment: Needs assistance Sitting-balance support: No upper extremity supported;Feet supported Sitting balance-Leahy Scale: Normal     Standing balance support: No upper extremity supported Standing balance-Leahy Scale: Good           [place] - Eyes Opened: 30 (incr anterior-posterior sway, but no step) [place] - Eyes Closed: 10 (no incr in sway compared to eyes open) High level balance activites: Backward walking;Direction changes;Turns;Sudden stops;Head turns High Level Balance Comments: able to maintain straight path with head turns (only turns head 45 degrees); multiple turns and stops with loss of balance x 1 (see above) Standardized Balance Assessment Standardized Balance Assessment : Berg Balance Test Berg Balance Test Sit to Stand: Able to stand without using hands and stabilize independently Standing Unsupported: Able to stand safely 2 minutes Sitting with Back Unsupported but Feet Supported on Floor or Stool: Able to sit safely and securely 2 minutes Stand to Sit: Sits safely with minimal use of hands Transfers: Able to transfer safely, minor use of hands Standing Unsupported with Eyes Closed: Able to stand 10 seconds with supervision Standing Ubsupported with Feet Together: Able to place feet together independently and stand for 1 minute with supervision From Standing, Reach Forward with Outstretched Arm: Can reach forward >12 cm safely (5") From Standing Position, Pick up Object from Floor: Able to pick up shoe, needs supervision From Standing Position, Turn to Look Behind Over each Shoulder: Turn sideways only but maintains balance  Pertinent Vitals/Pain Pain Assessment: No/denies pain    Home Living Family/patient expects to be discharged to:: Private residence Living Arrangements: Alone Available Help at Discharge:  Family;Available PRN/intermittently (dtr checks daily) Type of Home: House Home Access: Stairs to enter Entrance Stairs-Rails: Right;Left;Can reach both Entrance Stairs-Number of Steps: 4 Home Layout: One level Home Equipment: Cane - single point;Walker - 2 wheels      Prior Function Level of Independence: Needs assistance   Gait / Transfers Assistance Needed: independent; denies h/o imbalance  ADL's / Homemaking Assistance Needed: ind BADLs; does not drive; daughter shops for him; friend does yardwork        Hand Dominance   Dominant Hand: Left    Extremity/Trunk Assessment   Upper Extremity Assessment: Overall WFL for tasks assessed           Lower Extremity Assessment: Overall WFL for tasks assessed      Cervical / Trunk Assessment: Normal  Communication   Communication: No difficulties  Cognition Arousal/Alertness: Awake/alert Behavior During Therapy: WFL for tasks assessed/performed Overall Cognitive Status: No family/caregiver present to determine baseline cognitive functioning                      General Comments General comments (skin integrity, edema, etc.): Educated on stopping when he begins to feel unsteady/off balance; attempted incr stride length, however pt unable to maintain.     Exercises        Assessment/Plan    PT Assessment Patient needs continued PT services  PT Diagnosis Difficulty walking   PT Problem List Decreased balance;Decreased knowledge of use of DME  PT Treatment Interventions DME instruction;Gait training;Stair training;Therapeutic activities;Balance training;Patient/family education   PT Goals (Current goals can be found in the Care Plan section) Acute Rehab PT Goals Patient Stated Goal: walk without device PT Goal Formulation: With patient Time For Goal Achievement: 08/31/14 Potential to Achieve Goals: Good    Frequency Min 3X/week   Barriers to discharge Decreased caregiver support alone with daughter  checking in each day    Co-evaluation               End of Session Equipment Utilized During Treatment: Gait belt Activity Tolerance: Patient tolerated treatment well Patient left: in chair;with call bell/phone within reach;with chair alarm set Nurse Communication: Mobility status    Functional Assessment Tool Used: clinical judgement Functional Limitation: Mobility: Walking and moving around Mobility: Walking and Moving Around Current Status [phone]): At least 20 percent but less than 40 percent impaired, limited or restricted Mobility: Walking and Moving Around Goal Status [phone]): At least 1 percent but less than 20 percent impaired, limited or restricted    Time: 0840-0902 PT Time Calculation (min) (ACUTE ONLY): 22 min   Charges:   PT Evaluation $Initial PT Evaluation Tier I: 1 Procedure PT Treatments $Gait Training: 8-22 mins   PT G Codes:   Functional Assessment Tool Used: clinical judgement Functional Limitation: Mobility: Walking and moving around    Darrel Baroni 08/24/2014, 9:18 AM Pager [phone]

## 2014-08-25 LAB — GLUCOSE, CAPILLARY
Glucose-Capillary: 85 mg/dL (ref 70–99)
Glucose-Capillary: 150 mg/dL — ABNORMAL HIGH (ref 70–99)

## 2014-08-25 LAB — CBC WITH DIFFERENTIAL/PLATELET
BASOS PCT: 0 % (ref 0–1)
Basophils Absolute: 0 10*3/uL (ref 0.0–0.1)
EOS ABS: 0.1 10*3/uL (ref 0.0–0.7)
Eosinophils Relative: 2 % (ref 0–5)
HCT: 34.7 % — ABNORMAL LOW (ref 39.0–52.0)
Hemoglobin: 11.8 g/dL — ABNORMAL LOW (ref 13.0–17.0)
Lymphocytes Relative: 25 % (ref 12–46)
Lymphs Abs: 1.3 10*3/uL (ref 0.7–4.0)
MCH: 27.4 pg (ref 26.0–34.0)
MCHC: 34 g/dL (ref 30.0–36.0)
MCV: 80.5 fL (ref 78.0–100.0)
Monocytes Absolute: 0.8 10*3/uL (ref 0.1–1.0)
Monocytes Relative: 16 % — ABNORMAL HIGH (ref 3–12)
NEUTROS PCT: 57 % (ref 43–77)
Neutro Abs: 2.9 10*3/uL (ref 1.7–7.7)
PLATELETS: 205 10*3/uL (ref 150–400)
RBC: 4.31 MIL/uL (ref 4.22–5.81)
RDW: 14.6 % (ref 11.5–15.5)
WBC: 5.2 10*3/uL (ref 4.0–10.5)

## 2014-08-25 LAB — COMPREHENSIVE METABOLIC PANEL
ALK PHOS: 55 U/L (ref 39–117)
ALT: 10 U/L (ref 0–53)
AST: 18 U/L (ref 0–37)
Albumin: 2.8 g/dL — ABNORMAL LOW (ref 3.5–5.2)
Anion gap: 9 (ref 5–15)
BILIRUBIN TOTAL: 0.4 mg/dL (ref 0.3–1.2)
BUN: 17 mg/dL (ref 6–23)
CHLORIDE: 108 meq/L (ref 96–112)
CO2: 24 mEq/L (ref 19–32)
Calcium: 8.7 mg/dL (ref 8.4–10.5)
Creatinine, Ser: 1.29 mg/dL (ref 0.50–1.35)
GFR calc non Af Amer: 46 mL/min — ABNORMAL LOW (ref >90)
GFR, EST AFRICAN AMERICAN: 54 mL/min — AB (ref >90)
Glucose, Bld: 73 mg/dL (ref 70–99)
POTASSIUM: 3.6 meq/L — AB (ref 3.7–5.3)
SODIUM: 141 meq/L (ref 137–147)
Total Protein: 6 g/dL (ref 6.0–8.3)

## 2014-08-25 MED ORDER — DOCUSATE SODIUM 100 MG PO CAPS
200.0000 mg | ORAL_CAPSULE | Freq: Two times a day (BID) | ORAL | Status: DC
  Administered 2014-08-25: 200 mg via ORAL
  Filled 2014-08-25: qty 2

## 2014-08-25 MED ORDER — POLYETHYLENE GLYCOL 3350 17 G PO PACK: 17.0000 g | PACK | Freq: Two times a day (BID) | ORAL | Status: DC | PRN

## 2014-08-25 MED ORDER — BISACODYL 10 MG RE SUPP
10.0000 mg | Freq: Every day | RECTAL | Status: DC
  Administered 2014-08-25: 10 mg via RECTAL
  Filled 2014-08-25: qty 1

## 2014-08-25 MED ORDER — SORBITOL 70 % SOLN
960.0000 mL | TOPICAL_OIL | Freq: Once | ORAL | Status: AC
  Administered 2014-08-25: 960 mL via RECTAL
  Filled 2014-08-25: qty 240

## 2014-08-25 MED ORDER — MAGNESIUM HYDROXIDE 400 MG/5ML PO SUSP
30.0000 mL | Freq: Two times a day (BID) | ORAL | Status: DC
  Administered 2014-08-25: 30 mL via ORAL
  Filled 2014-08-25: qty 30

## 2014-08-25 MED ORDER — DSS 100 MG PO CAPS: 200.0000 mg | ORAL_CAPSULE | Freq: Two times a day (BID) | ORAL | Status: DC | PRN

## 2014-08-25 MED ORDER — POTASSIUM CHLORIDE CRYS ER 20 MEQ PO TBCR
40.0000 meq | EXTENDED_RELEASE_TABLET | Freq: Once | ORAL | Status: AC
  Administered 2014-08-25: 40 meq via ORAL
  Filled 2014-08-25: qty 2

## 2014-08-25 MED ORDER — POLYETHYLENE GLYCOL 3350 17 G PO PACK
17.0000 g | PACK | Freq: Two times a day (BID) | ORAL | Status: DC
  Administered 2014-08-25: 17 g via ORAL
  Filled 2014-08-25: qty 1

## 2014-08-25 NOTE — Discharge Summary (Addendum)
Marvin Bishop, is a 80 y.o. male  DOB 26-Sep-1920  MRN [id number].  Admission date:  08/23/2014  Admitting Physician  Shanda Howells, MD  Discharge Date:  08/25/2014   Primary MD  [name], MD  Recommendations for primary care physician for things to follow:   Repeat BMP, CBC and abdominal x-ray next visit   Admission Diagnosis  Abdominal pain [R10.9]   Discharge Diagnosis  Abdominal pain [R10.9]     Active Problems:   Hypertension   Diabetes mellitus   Dementia without behavioral disturbance   Ileus      Past Medical History  Diagnosis Date  . Arthritis   . Diabetes mellitus   . Hypertension   . Hypercholesteremia   . Insomnia     occasional  . Nephrolithiasis     right UVJ stone  . Dementia   . Prostate cancer   . Memory loss   . Headache(784.0)     slight  . Asthma     hx of as child  . PONV (postoperative nausea and vomiting)   . Complete loss of vision     right eye  . Peripheral neuropathy     both legs    Past Surgical History  Procedure Laterality Date  . Appendectomy    . Pars plana vitrectomy  10/24/2011    Procedure: PARS PLANA VITRECTOMY WITH 23 GAUGE;  Surgeon: Adonis Brook, MD;  Location: [facility];  Service: Ophthalmology;  Laterality: Right;  . Tonsillectomy  as teen  . Knee arthroscopy Right   . Hemorroidectomy    . Cystoscopy w/ ureteral stent placement Right 10/01/2013    Procedure: cystoscopy right retrograde holmium laser lithotripsy;  Surgeon: Franchot Gallo, MD;  Location: [facility];  Service: Urology;  Laterality: Right;  holmium laser application       History of present illness and  Hospital Course:     Kindly see H&P for history of present illness and admission details, please review complete Labs, Consult reports and Test reports for all details in brief  HPI  from the  history and physical done on the day of admission   This is a 80 y.o. year old male with significant past medical history of HTN, DM, dementia presenting with ileus. Pt currently lives at home alone. Has twice day caregivers. Family reports pt with abd pain, NBNB emesis today. Went to PCPs office w/ sxs. Was redirected to the ER.  Temp 98.6, HR 100s, resp 10s-20s, BP 90s-130s, Satting 99% on RA. WBC 6, Hgb 14.5, Cr 1.3. Lactate 2.2, lipase 15. Trop neg. UA showing elevated spec grav, otherwise WNL. CT abd and pelvis shows Several air in fluid-filled small bowel loops at the upper limits of normal in diameter over the mid to lower abdomen. Findings may be seen in a viral gastroenteritis versus ileus or early/partial obstructive process. CXR WNL.    Hospital Course    1. Abdominal pain with nausea and vomiting due to mild ileus versus gastroenteritis. Clinically resolved, passing flatus, repeat KUB  stable, no abdominal pain or nausea, tolerating diet and eager to go home. Has some stool border on x-ray. Initiated bowel regimen and will give me a lax along with Colace as needed for home. Informed daughter in detail. Request PCP to check CBC, BMP and abdominal x-ray next visit. Home PT and nursing ordered. Advised to cut back on narcotics and increase activity as much as possible with appropriate assistance and safety.      2.DM2 - continue home regimen. PCP to monitor and adjust medications for appropriateglycemic control.  Lab Results  Component Value Date   HGBA1C 7.3* 08/23/2014     3. Dementia. At risk for delirium. Minimize narcotics and benzos.   4. BPH. Continue Flomax.   5. GERD. On PPI continue.   6. Dyslipidemia. Home dose statin no change.   7.Non specific CT findings of Liver-Kidney - age appropriate folow up by PCP.      Discharge Condition: stable   Follow UP  Follow-up Information    Follow up with HUSAIN,KARRAR, MD. Schedule an appointment as soon as  possible for a visit in 2 days.   Specialty:  Internal Medicine   Contact information:   [address], [address] [place] [address] [phone]         Discharge Instructions  and  Discharge Medications      Discharge Instructions    Discharge instructions    Complete by:  As directed   Ileus, weakness, dementia     Increase activity slowly    Complete by:  As directed             Medication List    TAKE these medications        aspirin EC 81 MG tablet  Take 81 mg by mouth daily.     atropine 1 % ophthalmic solution  Place 1 drop into the right eye 2 (two) times daily.     dorzolamide-timolol 22.3-6.8 MG/ML ophthalmic solution  Commonly known as:  COSOPT  Place 1 drop into the right eye 2 (two) times daily.     DSS 100 MG Caps  Take 200 mg by mouth 2 (two) times daily as needed for mild constipation.     DUREZOL 0.05 % Emul  Generic drug:  Difluprednate  Place 1 drop into the right eye 2 (two) times daily.     esomeprazole 40 MG capsule  Commonly known as:  NEXIUM  Take 40 mg by mouth daily at 12 noon.     furosemide 20 MG tablet  Commonly known as:  LASIX  Take 20 mg by mouth every morning.     gabapentin 100 MG capsule  Commonly known as:  NEURONTIN  Take 200 mg by mouth at bedtime.     GINKGO PO  Take 2 capsules by mouth at bedtime. Gingko Smart Vitamin softgels     glipiZIDE 10 MG tablet  Commonly known as:  GLUCOTROL  Take 10 mg by mouth 2 (two) times daily before a meal.     HYDROcodone-acetaminophen 5-325 MG per tablet  Commonly known as:  NORCO/VICODIN  Take 1 tablet by mouth every 6 (six) hours as needed for moderate pain.     latanoprost 0.005 % ophthalmic solution  Commonly known as:  XALATAN  Place 1 drop into the right eye at bedtime.     LEVEMIR FLEXTOUCH 100 UNIT/ML Pen  Generic drug:  Insulin Detemir  Inject 10 Units into the skin daily at 10 pm.  linagliptin 5 MG Tabs tablet  Commonly known as:  TRADJENTA    Take 5 mg by mouth daily.     lisinopril 2.5 MG tablet  Commonly known as:  PRINIVIL,ZESTRIL  Take 2.5 mg by mouth every morning.     meloxicam 7.5 MG tablet  Commonly known as:  MOBIC  Take 7.5 mg by mouth daily as needed for pain.     metroNIDAZOLE 500 MG tablet  Commonly known as:  FLAGYL  Take 1 tablet (500 mg total) by mouth 2 (two) times daily. One po bid x 7 days     multivitamin with minerals Tabs tablet  Take 1 tablet by mouth daily.     polyethylene glycol packet  Commonly known as:  MIRALAX / GLYCOLAX  Take 17 g by mouth 2 (two) times daily as needed.     sildenafil 50 MG tablet  Commonly known as:  VIAGRA  Take 50 mg by mouth daily as needed for erectile dysfunction.     simvastatin 40 MG tablet  Commonly known as:  ZOCOR  Take 40 mg by mouth every morning.     tamsulosin 0.4 MG Caps capsule  Commonly known as:  FLOMAX  Take 0.4 mg by mouth daily after breakfast.     temazepam 30 MG capsule  Commonly known as:  RESTORIL  Take 30 mg by mouth at bedtime as needed for sleep.          Diet and Activity recommendation: See Discharge Instructions above   Consults obtained - none   Major procedures and Radiology Reports - PLEASE review detailed and final reports for all details, in brief -       Dg Chest 2 View  08/23/2014   CLINICAL DATA:  Abdominal pain, right upper quadrant with nausea.  EXAM: CHEST  2 VIEW  COMPARISON:  05/19/2014  FINDINGS: Normal heart size. Negative aortic and hilar contours. Hyperinflation and interstitial coarsening, possible COPD. There is no edema, consolidation, effusion, or pneumothorax.  IMPRESSION: No active cardiopulmonary disease.   Electronically Signed   By: Jorje Guild M.D.   On: 08/23/2014 17:46   Ct Abdomen Pelvis W Contrast  08/23/2014   CLINICAL DATA:  Right upper quadrant pain and nausea.  EXAM: CT ABDOMEN AND PELVIS WITH CONTRAST  TECHNIQUE: Multidetector CT imaging of the abdomen and pelvis was performed  using the standard protocol following bolus administration of intravenous contrast.  CONTRAST:  15mL OMNIPAQUE IOHEXOL 300 MG/ML  SOLN  COMPARISON:  05/19/2014, 08/22/2013 and 02/10/2010.  FINDINGS: Lung bases demonstrate minimal linear scarring over the right base. No change in a well-defined 7.5 cm hypodense mass over the lateral segment of the left lobe of the liver with patchy enhancement likely an atypical hemangioma. Remainder of the liver is unremarkable. The spleen, pancreas, gallbladder and adrenal glands are within normal. Kidneys normal in size without hydronephrosis or nephrolithiasis. There are least 3 left renal cysts without significant change. Ureters are within normal. Prior appendectomy. Mild diverticulosis of the colon. Resolution of previously noted wall thickening over the distal descending/sigmoid colon. No free fluid or focal inflammatory change is present. Mild atherosclerotic plaque involving the abdominal aorta and iliac arteries. There are several fluid in air-filled mildly prominent small bowel loops in the mid to lower abdomen measuring up to 2.9 cm in diameter.  Pelvic images demonstrate the bladder and rectum to be within normal. There are 3 metallic markers over the prostate unchanged. There are moderate degenerative changes of the spine and  hips.  IMPRESSION: Several air in fluid-filled small bowel loops at the upper limits of normal in diameter over the mid to lower abdomen. Findings may be seen in a viral gastroenteritis versus ileus or early/partial obstructive process. Recommend serial follow-up abdominal films as clinically indicated.  Stable 7.5 cm mass over the lateral stable of the left lobe of the liver likely an atypical hemangioma.  Stable left renal cysts.  Diverticulosis of the colon.   Electronically Signed   By: Marin Olp M.D.   On: 08/23/2014 19:34   Dg Abd Portable 1v  08/24/2014   CLINICAL DATA:  Pain across upper abdomen; nausea ; constipation  EXAM:  PORTABLE ABDOMEN - 1 VIEW  COMPARISON:  CT abdomen and pelvis August 23, 2014  FINDINGS: There is contrast in the colon. There is no bowel dilatation or air-fluid level to suggest obstruction on this supine examination. No free air appreciable. There is degenerative change in the lumbar spine.  IMPRESSION: Bowel gas pattern overall unremarkable on this supine examination. Contrast present throughout much of the colon. Moderate stool is noted in the colon.   Electronically Signed   By: Lowella Grip M.D.   On: 08/24/2014 08:15    Micro Results      No results found for this or any previous visit (from the past 240 hour(s)).     Today   Subjective:   Marvin Bishop today has no headache,no chest abdominal pain,no new weakness tingling or numbness, feels much better wants to go home today.    Objective:   Blood pressure 122/67, pulse 95, temperature 97.7 F (36.5 C), temperature source Oral, resp. rate 19, height 6\' 2"  (1.88 m), weight 94.711 kg (208 lb 12.8 oz), SpO2 98 %.   Intake/Output Summary (Last 24 hours) at 08/25/14 1048 Last data filed at 08/24/14 1835  Gross per 24 hour  Intake    240 ml  Output      0 ml  Net    240 ml    Exam Awake Alert, Oriented x 2, No new F.N deficits, Normal affect Nolan.AT,PERRAL Supple Neck,No JVD, No cervical lymphadenopathy appriciated.  Symmetrical Chest wall movement, Good air movement bilaterally, CTAB RRR,No Gallops,Rubs or new Murmurs, No Parasternal Heave +ve B.Sounds, Abd Soft, Non tender, No organomegaly appriciated, No rebound -guarding or rigidity. No Cyanosis, Clubbing or edema, No new Rash or bruise  Data Review   CBC w Diff: Lab Results  Component Value Date   WBC 5.2 08/25/2014   HGB 11.8* 08/25/2014   HCT 34.7* 08/25/2014   PLT 205 08/25/2014   LYMPHOPCT 25 08/25/2014   MONOPCT 16* 08/25/2014   EOSPCT 2 08/25/2014   BASOPCT 0 08/25/2014    CMP: Lab Results  Component Value Date   NA 141 08/25/2014   K 3.6*  08/25/2014   CL 108 08/25/2014   CO2 24 08/25/2014   BUN 17 08/25/2014   CREATININE 1.29 08/25/2014   PROT 6.0 08/25/2014   ALBUMIN 2.8* 08/25/2014   BILITOT 0.4 08/25/2014   ALKPHOS 55 08/25/2014   AST 18 08/25/2014   ALT 10 08/25/2014  .   Total Time in preparing paper work, data evaluation and todays exam - 35 minutes  Thurnell Lose M.D on 08/25/2014 at 10:48 AM  [facility] Office  [phone]

## 2014-08-25 NOTE — Progress Notes (Signed)
Physical Therapy Treatment Patient Details Name: JAXTYN LINVILLE MRN: [id number] DOB: 1920/08/14 Today's Date: 08/25/2014    History of Present Illness Adm 08/23/14 with ileus PMHx-dementia, DM, prostate Ca, HTN    PT Comments    Patient agreeable for ambulation and to practice steps prior to DC home. Patient is planning to DC home today with HHPT and assistance from his daughter as needed.   Follow Up Recommendations  Home health PT;Supervision for mobility/OOB     Equipment Recommendations  None recommended by PT    Recommendations for Other Services       Precautions / Restrictions Precautions Precautions: Fall    Mobility  Bed Mobility                  Transfers Overall transfer level: Independent Equipment used: None                Ambulation/Gait Ambulation/Gait assistance: Supervision Ambulation Distance (Feet): 600 Feet Assistive device: None Gait Pattern/deviations: Step-through pattern;Wide base of support   Gait velocity interpretation: at or above normal speed for age/gender General Gait Details: antalgic with complaints of some R knee instability. no LOB noted   Stairs Stairs: Yes Stairs assistance: Min guard Stair Management: Step to pattern;Forwards;Two rails Number of Stairs: 8 General stair comments: Minguard for safety. No LOB on steps  Wheelchair Mobility    Modified Rankin (Stroke Patients Only)       Balance                                    Cognition Arousal/Alertness: Awake/alert Behavior During Therapy: WFL for tasks assessed/performed Overall Cognitive Status: No family/caregiver present to determine baseline cognitive functioning                      Exercises      General Comments        Pertinent Vitals/Pain Pain Assessment: No/denies pain    Home Living                      Prior Function            PT Goals (current goals can now be found in the care plan  section) Progress towards PT goals: Progressing toward goals    Frequency  Min 3X/week    PT Plan Current plan remains appropriate    Co-evaluation             End of Session Equipment Utilized During Treatment: Gait belt Activity Tolerance: Patient tolerated treatment well Patient left: in chair;with call bell/phone within reach;with chair alarm set     Time: 1359-1415 PT Time Calculation (min) (ACUTE ONLY): 16 min  Charges:  $Gait Training: 8-22 mins                    G Codes:      Jacqualyn Posey 08/25/2014, 2:19 PM 08/25/2014 Jacqualyn Posey PTA [phone] pager [phone] office

## 2014-08-25 NOTE — Progress Notes (Signed)
Talked to patient about [facility] choices, patient requested that I talk to his daughter [name] [phone]); TCT Olivia to offer [facility] options, [name] chose [facility]; Miranda with [facility] called for arrangements; No DME neededAneta Mins [phone]

## 2014-08-25 NOTE — Discharge Instructions (Signed)
Follow with Primary MD [name], MD in 2 days   Get CBC, CMP, Abd X ray checked  by Primary MD next visit.    Activity: As tolerated with Full fall precautions use walker/cane & assistance as needed   Disposition Home     Diet: Heart Healthy Low Carb.  For Heart failure patients - Check your Weight same time everyday, if you gain over 2 pounds, or you develop in leg swelling, experience more shortness of breath or chest pain, call your Primary MD immediately. Follow Cardiac Low Salt Diet and 1.8 lit/day fluid restriction.   On your next visit with your primary care physician please Get Medicines reviewed and adjusted.   Please request your Prim.MD to go over all Hospital Tests and Procedure/Radiological results at the follow up, please get all Hospital records sent to your Prim MD by signing hospital release before you go home.   If you experience worsening of your admission symptoms, develop shortness of breath, life threatening emergency, suicidal or homicidal thoughts you must seek medical attention immediately by calling 911 or calling your MD immediately  if symptoms less severe.  You Must read complete instructions/literature along with all the possible adverse reactions/side effects for all the Medicines you take and that have been prescribed to you. Take any new Medicines after you have completely understood and accpet all the possible adverse reactions/side effects.   Do not drive, operating heavy machinery, perform activities at heights, swimming or participation in water activities or provide baby sitting services if your were admitted for syncope or siezures until you have seen by Primary MD or a Neurologist and advised to do so again.  Do not drive when taking Pain medications.    Do not take more than prescribed Pain, Sleep and Anxiety Medications  Special Instructions: If you have smoked or chewed Tobacco  in the last 2 yrs please stop smoking, stop any regular  Alcohol  and or any Recreational drug use.  Wear Seat belts while driving.   Please note  You were cared for by a hospitalist during your hospital stay. If you have any questions about your discharge medications or the care you received while you were in the hospital after you are discharged, you can call the unit and asked to speak with the hospitalist on call if the hospitalist that took care of you is not available. Once you are discharged, your primary care physician will handle any further medical issues. Please note that NO REFILLS for any discharge medications will be authorized once you are discharged, as it is imperative that you return to your primary care physician (or establish a relationship with a primary care physician if you do not have one) for your aftercare needs so that they can reassess your need for medications and monitor your lab values.

## 2014-08-29 ENCOUNTER — Encounter: Payer: Self-pay | Admitting: *Deleted

## 2014-08-31 ENCOUNTER — Other Ambulatory Visit: Payer: Self-pay | Admitting: Internal Medicine

## 2014-08-31 ENCOUNTER — Ambulatory Visit
Admission: RE | Admit: 2014-08-31 | Discharge: 2014-08-31 | Disposition: A | Discharge status: Home / Self Care | Payer: Medicare FFS | Source: Ambulatory Visit | Attending: Internal Medicine | Admitting: Internal Medicine

## 2014-08-31 DIAGNOSIS — K560 Paralytic ileus: Secondary | ICD-10-CM

## 2014-09-01 ENCOUNTER — Encounter: Payer: Self-pay | Admitting: Neurology

## 2014-09-07 ENCOUNTER — Encounter: Payer: Self-pay | Admitting: Neurology

## 2014-09-14 ENCOUNTER — Ambulatory Visit (INDEPENDENT_AMBULATORY_CARE_PROVIDER_SITE_OTHER): Payer: Medicare PPO | Admitting: Nurse Practitioner

## 2014-09-14 ENCOUNTER — Encounter: Payer: Self-pay | Admitting: Nurse Practitioner

## 2014-09-14 ENCOUNTER — Ambulatory Visit: Payer: Medicare PPO | Admitting: Nurse Practitioner

## 2014-09-14 VITALS — BP 119/71 | HR 82 | Temp 97.0°F | Ht 74.0 in | Wt 209.0 lb

## 2014-09-14 DIAGNOSIS — F0390 Unspecified dementia without behavioral disturbance: Secondary | ICD-10-CM

## 2014-09-14 DIAGNOSIS — R413 Other amnesia: Secondary | ICD-10-CM

## 2014-09-14 MED ORDER — MEMANTINE HCL 10 MG PO TABS: 10.0000 mg | ORAL_TABLET | Freq: Two times a day (BID) | ORAL | Status: DC

## 2014-09-14 NOTE — Patient Instructions (Signed)
Namenda starter pack then 10 mg twice daily Memory score is stable Use cane for stable ambulation Follow-up in 6 months

## 2014-09-14 NOTE — Progress Notes (Signed)
[facility]  PATIENT: Marvin Bishop Sr. DOB: 1920/07/02   REASON FOR VISIT: Follow-up for dementia   HISTORY OF PRESENT ILLNESS:Marvin Bishop, 80 year old male returns for followup.  He has a history of dementia . He stopped his Aricept in August due to GI effect. There is no documented phone call to the office concerning this .His Mini-Mental Status exam is stable.  He continues to live alone but has family to check on him frequently. He also has someone to administer his meds that is a paid caregiver. He is accompanied by a daughter today she denies that there have been any safety issues identified. He continues to cook his breakfast but gets Meals on Wheels. MRI showed mild changes of chronic microvascular ischemia and generalized cerebral atrophy.He returns for reevaluation   HISTORY: 80 years old right-handed male, accompanied by her daughter, referred by her primary care physician Dr. Benita Bishop for evaluation of memory trouble  He had a past medical history of hypertension, diabetes, hyperlipidemia, prostate cancer, had a history of a left knee reconstruction surgery  He had sixth-years of education, is a retired Sports coach in 2007, he lives alone, still driving short distance, over the past one year, his daughter noticed that he has become forgetful, he forget to take his medications, he misplace things, he often gets out of his car, forgot to close his car door, he also has mild gait difficulty, using a cane.  He denies sleep difficulty, has mild gait difficulty, he denies bowel and bladder incontinence, he can still pay his own bill, cooking simple meals without significant difficulty. He still lives by himself, he still drives short distance, has not got lost yet.  UPDATE Aug 04 2013:  MRI showed mild changes of chronic microvascular ischemia and generalized cerebral atrophy.  Lab Jan 2014, normal CBX, CMP, A1c 6.1, LDL 100, TSH.  He is taking over counter  MVI, he has tried Aricept, but has lost appetite, daughter does not want him to try any medications recently   REVIEW OF SYSTEMS: Full 14 system review of systems performed and notable only for those listed, all others are neg:  Constitutional: N/A  Cardiovascular: N/A  Ear/Nose/Throat: N/A  Skin: N/A  Eyes: N/A  Respiratory: N/A  Gastroitestinal: N/A  Hematology/Lymphatic: N/A  Endocrine: N/A Musculoskeletal: Walking difficulty, history of back pain, arthritis Allergy/Immunology: N/A  Neurological: Memory loss, headache  Psychiatric: N/A Sleep : NA   ALLERGIES: Allergies  Allergen Reactions  . Donepezil     Medication listed but no reaction stated; family not sure of reaction  . Hydrocodone Nausea And Vomiting    HOME MEDICATIONS: Outpatient Prescriptions Prior to Visit  Medication Sig Dispense Refill  . aspirin EC 81 MG tablet Take 81 mg by mouth daily.    Marland Kitchen atropine 1 % ophthalmic solution Place 1 drop into the right eye 2 (two) times daily.   4  . docusate sodium 100 MG CAPS Take 200 mg by mouth 2 (two) times daily as needed for mild constipation. 25 capsule 0  . dorzolamide-timolol (COSOPT) 22.3-6.8 MG/ML ophthalmic solution Place 1 drop into the right eye 2 (two) times daily.     . DUREZOL 0.05 % EMUL Place 1 drop into the right eye 2 (two) times daily.     Marland Kitchen esomeprazole (NEXIUM) 40 MG capsule Take 40 mg by mouth daily at 12 noon.    . furosemide (LASIX) 20 MG tablet Take 20 mg by mouth every morning.     Marland Kitchen  gabapentin (NEURONTIN) 100 MG capsule Take 200 mg by mouth at bedtime.    . Ginkgo Biloba (GINKGO PO) Take 2 capsules by mouth at bedtime. Gingko Smart Vitamin softgels    . glipiZIDE (GLUCOTROL) 10 MG tablet Take 10 mg by mouth 2 (two) times daily before a meal.     . HYDROcodone-acetaminophen (NORCO/VICODIN) 5-325 MG per tablet Take 1 tablet by mouth every 6 (six) hours as needed for moderate pain.    Marland Kitchen latanoprost (XALATAN) 0.005 % ophthalmic solution Place 1  drop into the right eye at bedtime.     Marland Kitchen LEVEMIR FLEXTOUCH 100 UNIT/ML Pen Inject 10 Units into the skin daily at 10 pm.     . linagliptin (TRADJENTA) 5 MG TABS tablet Take 5 mg by mouth daily.    Marland Kitchen lisinopril (PRINIVIL,ZESTRIL) 2.5 MG tablet Take 2.5 mg by mouth every morning.     . meloxicam (MOBIC) 7.5 MG tablet Take 7.5 mg by mouth daily as needed for pain.    . Multiple Vitamin (MULTIVITAMIN WITH MINERALS) TABS tablet Take 1 tablet by mouth daily.    . polyethylene glycol (MIRALAX / GLYCOLAX) packet Take 17 g by mouth 2 (two) times daily as needed. 28 each 0  . sildenafil (VIAGRA) 50 MG tablet Take 50 mg by mouth daily as needed for erectile dysfunction.    . simvastatin (ZOCOR) 40 MG tablet Take 40 mg by mouth every morning.     . tamsulosin (FLOMAX) 0.4 MG CAPS capsule Take 0.4 mg by mouth daily after breakfast.    . temazepam (RESTORIL) 30 MG capsule Take 30 mg by mouth at bedtime as needed for sleep.    . metroNIDAZOLE (FLAGYL) 500 MG tablet Take 1 tablet (500 mg total) by mouth 2 (two) times daily. One po bid x 7 days (Patient not taking: Reported on 09/14/2014) 14 tablet 0   No facility-administered medications prior to visit.    PAST MEDICAL HISTORY: Past Medical History  Diagnosis Date  . Arthritis   . Diabetes mellitus   . Hypertension   . Hypercholesteremia   . Insomnia     occasional  . Nephrolithiasis     right UVJ stone  . Dementia   . Prostate cancer   . Memory loss   . Headache(784.0)     slight  . Asthma     hx of as child  . PONV (postoperative nausea and vomiting)   . Complete loss of vision     right eye  . Peripheral neuropathy     both legs    PAST SURGICAL HISTORY: Past Surgical History  Procedure Laterality Date  . Appendectomy    . Pars plana vitrectomy  10/24/2011    Procedure: PARS PLANA VITRECTOMY WITH 23 GAUGE;  Surgeon: Adonis Brook, MD;  Location: [place];  Service: Ophthalmology;  Laterality: Right;  . Tonsillectomy  as teen  . Knee  arthroscopy Right   . Hemorroidectomy    . Cystoscopy w/ ureteral stent placement Right 10/01/2013    Procedure: cystoscopy right retrograde holmium laser lithotripsy;  Surgeon: Franchot Gallo, MD;  Location: [facility];  Service: Urology;  Laterality: Right;  holmium laser application    FAMILY HISTORY: Family History  Problem Relation Age of Onset  . Cancer Mother     uterus  . Heart Problems Father   . Cancer Brother     stomach  . Cancer Brother     throat  . Colon cancer Brother     SOCIAL  HISTORY: History   Social History  . Marital Status: Widowed    Spouse Name: N/A    Number of Children: 5  . Years of Education: 6 th   Occupational History  .      Retired   Social History Main Topics  . Smoking status: Former Smoker -- 0.25 packs/day    Types: Cigarettes    Quit date: 10/15/1966  . Smokeless tobacco: Never Used  . Alcohol Use: 0.0 oz/week     Comment: occasional  . Drug Use: No  . Sexual Activity: Not on file   Other Topics Concern  . Not on file   Social History Narrative   Patient lives at home alone with dog.   Retired.   Education.- 6 th  Grade   Right handed.   Caffeine-Soda , tea and coffee                 PHYSICAL EXAM  Filed Vitals:   09/14/14 1454  BP: 119/71  Pulse: 82  Temp: 97 F (36.1 C)  TempSrc: Oral  Height: 6\' 2"  (1.88 m)  Weight: 209 lb (94.802 kg)   Body mass index is 26.82 kg/(m^2). Generalized: Well developed, in no acute distress  Head: normocephalic and atraumatic,. Oropharynx benign   Musculoskeletal: No deformity   Neurological examination   Mentation: Alert , MMSE 18/30 missing items in orientation , calculation and 3 out of 3 recall. Follows all commands speech and language fluent  Cranial nerve II-XII: Left Pupil were equal round reactive to light. Static right ptosis and irregular pupil. extraocular movements were full, visual field were full on confrontational test. Facial sensation and strength  were normal. hearing was intact to finger rubbing bilaterally. Uvula tongue midline. head turning and shoulder shrug were normal and symmetric.Tongue protrusion into cheek strength was normal. Motor: normal bulk and tone, full strength in the BUE, BLE, fine finger movements normal, no pronator drift. No focal weakness Coordination: finger-nose-finger, heel-to-shin bilaterally, no dysmetria Reflexes: Brachioradialis 2/2, biceps 2/2, triceps 2/2, patellar 2/2, Achilles absent, plantar responses were flexor bilaterally. Gait and Station: Rising up from seated position without assistance, cautious unsteady gait, stooped posture with single-point cane  DIAGNOSTIC DATA (LABS, IMAGING, TESTING) - I reviewed patient records, labs, notes, testing and imaging myself where available.  Lab Results  Component Value Date   WBC 5.2 08/25/2014   HGB 11.8* 08/25/2014   HCT 34.7* 08/25/2014   MCV 80.5 08/25/2014   PLT 205 08/25/2014      Component Value Date/Time   NA 141 08/25/2014 0629   K 3.6* 08/25/2014 0629   CL 108 08/25/2014 0629   CO2 24 08/25/2014 0629   GLUCOSE 73 08/25/2014 0629   BUN 17 08/25/2014 0629   CREATININE 1.29 08/25/2014 0629   CALCIUM 8.7 08/25/2014 0629   PROT 6.0 08/25/2014 0629   ALBUMIN 2.8* 08/25/2014 0629   AST 18 08/25/2014 0629   ALT 10 08/25/2014 0629   ALKPHOS 55 08/25/2014 0629   BILITOT 0.4 08/25/2014 0629   GFRNONAA 46* 08/25/2014 0629   GFRAA 54* 08/25/2014 0629    Lab Results  Component Value Date   HGBA1C 7.3* 08/23/2014   Lab Results  Component Value Date   VITAMINB12 399 08/04/2013   Lab Results  Component Value Date   TSH 0.918 08/23/2013      ASSESSMENT AND PLAN  80 y.o. year old male  has a past medical history of Arthritis;  Dementia; here to follow-up. He stopped his  Aricept in August.  Namenda starter pack given then Namenda 10 mg twice daily RX given to daughter Memory score is stable Use cane for stable ambulation Follow-up in  6 months Dennie Bible, [facility], [facility], APRN  [facility] [address] [place], [place] [address] [phone]

## 2014-09-15 NOTE — Progress Notes (Signed)
I agree above plan. 

## 2014-09-20 ENCOUNTER — Other Ambulatory Visit: Payer: Medicare PPO

## 2014-10-04 ENCOUNTER — Ambulatory Visit (INDEPENDENT_AMBULATORY_CARE_PROVIDER_SITE_OTHER): Payer: Medicare PPO | Admitting: Podiatry

## 2014-10-04 DIAGNOSIS — M79673 Pain in unspecified foot: Secondary | ICD-10-CM

## 2014-10-04 DIAGNOSIS — B351 Tinea unguium: Secondary | ICD-10-CM

## 2014-10-04 NOTE — Progress Notes (Signed)
Subjective:     Patient ID: Marvin Bishop., male   DOB: 04/19/1920, 80 y.o.   MRN: [id number]  HPI patient presents with thick yellow brittle nailbeds 1-5 both feet that are painful and he cannot cut   Review of Systems     Objective:   Physical Exam Neurovascular status unchanged with thick yellow brittle nailbeds 1-5 both feet that are painful    Assessment:     Mycotic nail infection is with pain 1-5 both feet    Plan:     Debride painful nailbeds 1-5 both feet with no iatrogenic bleeding noted

## 2014-10-26 ENCOUNTER — Other Ambulatory Visit: Payer: Self-pay | Admitting: Internal Medicine

## 2014-11-29 DIAGNOSIS — H02132 Senile ectropion of right lower eyelid: Secondary | ICD-10-CM | POA: Diagnosis not present

## 2014-11-29 DIAGNOSIS — H11821 Conjunctivochalasis, right eye: Secondary | ICD-10-CM | POA: Diagnosis not present

## 2014-11-29 DIAGNOSIS — H04521 Eversion of right lacrimal punctum: Secondary | ICD-10-CM | POA: Diagnosis not present

## 2014-12-20 DIAGNOSIS — H21231 Degeneration of iris (pigmentary), right eye: Secondary | ICD-10-CM | POA: Diagnosis not present

## 2014-12-20 DIAGNOSIS — H18239 Secondary corneal edema, unspecified eye: Secondary | ICD-10-CM | POA: Diagnosis not present

## 2014-12-20 DIAGNOSIS — H40012 Open angle with borderline findings, low risk, left eye: Secondary | ICD-10-CM | POA: Diagnosis not present

## 2014-12-20 DIAGNOSIS — H4041X3 Glaucoma secondary to eye inflammation, right eye, severe stage: Secondary | ICD-10-CM | POA: Diagnosis not present

## 2014-12-31 DIAGNOSIS — S0501XA Injury of conjunctiva and corneal abrasion without foreign body, right eye, initial encounter: Secondary | ICD-10-CM | POA: Diagnosis not present

## 2014-12-31 DIAGNOSIS — H5711 Ocular pain, right eye: Secondary | ICD-10-CM | POA: Diagnosis not present

## 2015-01-03 ENCOUNTER — Encounter: Payer: Self-pay | Admitting: Podiatry

## 2015-01-03 ENCOUNTER — Ambulatory Visit (INDEPENDENT_AMBULATORY_CARE_PROVIDER_SITE_OTHER): Payer: Medicare PPO | Admitting: Podiatry

## 2015-01-03 DIAGNOSIS — B351 Tinea unguium: Secondary | ICD-10-CM

## 2015-01-03 DIAGNOSIS — M79673 Pain in unspecified foot: Secondary | ICD-10-CM | POA: Diagnosis not present

## 2015-01-03 NOTE — Progress Notes (Signed)
Subjective:     Patient ID: Marvin Bishop., male   DOB: Apr 19, 1920, 81 y.o.   MRN: [id number]  HPI patient presents with thick yellow brittle nailbeds 1-5 both feet that are painful and he cannot cut   Review of Systems     Objective:   Physical Exam Neurovascular status unchanged with thick yellow brittle nailbeds 1-5 both feet that are painful    Assessment:     Mycotic nail infection is with pain 1-5 both feet    Plan:     Debride painful nailbeds 1-5 both feet with no iatrogenic bleeding noted

## 2015-01-07 DIAGNOSIS — H5710 Ocular pain, unspecified eye: Secondary | ICD-10-CM | POA: Diagnosis not present

## 2015-01-07 DIAGNOSIS — H5440 Blindness, one eye, unspecified eye: Secondary | ICD-10-CM | POA: Insufficient documentation

## 2015-01-07 DIAGNOSIS — H5441 Blindness, right eye, normal vision left eye: Secondary | ICD-10-CM | POA: Diagnosis not present

## 2015-01-19 DIAGNOSIS — H5441 Blindness, right eye, normal vision left eye: Secondary | ICD-10-CM | POA: Diagnosis not present

## 2015-01-19 DIAGNOSIS — H16001 Unspecified corneal ulcer, right eye: Secondary | ICD-10-CM | POA: Diagnosis not present

## 2015-01-19 DIAGNOSIS — H40051 Ocular hypertension, right eye: Secondary | ICD-10-CM | POA: Diagnosis not present

## 2015-01-19 DIAGNOSIS — H16231 Neurotrophic keratoconjunctivitis, right eye: Secondary | ICD-10-CM | POA: Diagnosis not present

## 2015-01-19 DIAGNOSIS — H5710 Ocular pain, unspecified eye: Secondary | ICD-10-CM | POA: Diagnosis not present

## 2015-02-26 HISTORY — PX: OTHER SURGICAL HISTORY: SHX169

## 2015-02-28 DIAGNOSIS — H11431 Conjunctival hyperemia, right eye: Secondary | ICD-10-CM | POA: Diagnosis not present

## 2015-02-28 DIAGNOSIS — H5711 Ocular pain, right eye: Secondary | ICD-10-CM | POA: Diagnosis not present

## 2015-03-07 DIAGNOSIS — Z794 Long term (current) use of insulin: Secondary | ICD-10-CM | POA: Insufficient documentation

## 2015-03-07 DIAGNOSIS — K219 Gastro-esophageal reflux disease without esophagitis: Secondary | ICD-10-CM | POA: Insufficient documentation

## 2015-03-07 DIAGNOSIS — E119 Type 2 diabetes mellitus without complications: Secondary | ICD-10-CM | POA: Insufficient documentation

## 2015-03-08 ENCOUNTER — Emergency Department (HOSPITAL_COMMUNITY)
Admission: EM | Admit: 2015-03-08 | Discharge: 2015-03-08 | Discharge status: Against Medical Advice | Payer: Medicare FFS | Attending: Emergency Medicine | Admitting: Emergency Medicine

## 2015-03-08 ENCOUNTER — Encounter (HOSPITAL_COMMUNITY): Payer: Self-pay | Admitting: Cardiology

## 2015-03-08 DIAGNOSIS — E119 Type 2 diabetes mellitus without complications: Secondary | ICD-10-CM | POA: Insufficient documentation

## 2015-03-08 DIAGNOSIS — F0390 Unspecified dementia without behavioral disturbance: Secondary | ICD-10-CM | POA: Insufficient documentation

## 2015-03-08 DIAGNOSIS — R109 Unspecified abdominal pain: Secondary | ICD-10-CM | POA: Diagnosis not present

## 2015-03-08 DIAGNOSIS — R112 Nausea with vomiting, unspecified: Secondary | ICD-10-CM | POA: Insufficient documentation

## 2015-03-08 DIAGNOSIS — I10 Essential (primary) hypertension: Secondary | ICD-10-CM | POA: Insufficient documentation

## 2015-03-08 DIAGNOSIS — J45909 Unspecified asthma, uncomplicated: Secondary | ICD-10-CM | POA: Diagnosis not present

## 2015-03-08 DIAGNOSIS — H5711 Ocular pain, right eye: Secondary | ICD-10-CM | POA: Diagnosis not present

## 2015-03-08 DIAGNOSIS — Z794 Long term (current) use of insulin: Secondary | ICD-10-CM | POA: Diagnosis not present

## 2015-03-08 DIAGNOSIS — M1990 Unspecified osteoarthritis, unspecified site: Secondary | ICD-10-CM | POA: Diagnosis not present

## 2015-03-08 DIAGNOSIS — E11319 Type 2 diabetes mellitus with unspecified diabetic retinopathy without macular edema: Secondary | ICD-10-CM | POA: Diagnosis not present

## 2015-03-08 DIAGNOSIS — K219 Gastro-esophageal reflux disease without esophagitis: Secondary | ICD-10-CM | POA: Diagnosis not present

## 2015-03-08 DIAGNOSIS — H409 Unspecified glaucoma: Secondary | ICD-10-CM | POA: Diagnosis not present

## 2015-03-08 DIAGNOSIS — H5441 Blindness, right eye, normal vision left eye: Secondary | ICD-10-CM | POA: Diagnosis not present

## 2015-03-08 LAB — COMPREHENSIVE METABOLIC PANEL
ALK PHOS: 83 U/L (ref 38–126)
ALT: 17 U/L (ref 17–63)
AST: 27 U/L (ref 15–41)
Albumin: 3.6 g/dL (ref 3.5–5.0)
Anion gap: 12 (ref 5–15)
BILIRUBIN TOTAL: 0.7 mg/dL (ref 0.3–1.2)
BUN: 13 mg/dL (ref 6–20)
CALCIUM: 9.6 mg/dL (ref 8.9–10.3)
CHLORIDE: 102 mmol/L (ref 101–111)
CO2: 23 mmol/L (ref 22–32)
Creatinine, Ser: 1.27 mg/dL — ABNORMAL HIGH (ref 0.61–1.24)
GFR calc Af Amer: 55 mL/min — ABNORMAL LOW (ref >60)
GFR calc non Af Amer: 47 mL/min — ABNORMAL LOW (ref >60)
Glucose, Bld: 316 mg/dL — ABNORMAL HIGH (ref 65–99)
POTASSIUM: 3.9 mmol/L (ref 3.5–5.1)
Sodium: 137 mmol/L (ref 135–145)
TOTAL PROTEIN: 7.5 g/dL (ref 6.5–8.1)

## 2015-03-08 LAB — CBC WITH DIFFERENTIAL/PLATELET
BASOS PCT: 0 % (ref 0–1)
Basophils Absolute: 0 10*3/uL (ref 0.0–0.1)
EOS ABS: 0 10*3/uL (ref 0.0–0.7)
EOS PCT: 0 % (ref 0–5)
HEMATOCRIT: 41.2 % (ref 39.0–52.0)
HEMOGLOBIN: 13.7 g/dL (ref 13.0–17.0)
LYMPHS ABS: 0.7 10*3/uL (ref 0.7–4.0)
Lymphocytes Relative: 9 % — ABNORMAL LOW (ref 12–46)
MCH: 27 pg (ref 26.0–34.0)
MCHC: 33.3 g/dL (ref 30.0–36.0)
MCV: 81.3 fL (ref 78.0–100.0)
Monocytes Absolute: 0.3 10*3/uL (ref 0.1–1.0)
Monocytes Relative: 3 % (ref 3–12)
Neutro Abs: 7.5 10*3/uL (ref 1.7–7.7)
Neutrophils Relative %: 88 % — ABNORMAL HIGH (ref 43–77)
PLATELETS: 189 10*3/uL (ref 150–400)
RBC: 5.07 MIL/uL (ref 4.22–5.81)
RDW: 14.5 % (ref 11.5–15.5)
WBC: 8.5 10*3/uL (ref 4.0–10.5)

## 2015-03-08 NOTE — ED Notes (Signed)
Pt reports he had cornea surgery this morning on the right eye. Pt states he had felt nauseated since then and had multiple episodes of vomiting. Also having some abd pain.

## 2015-03-08 NOTE — ED Notes (Signed)
Pt states he is leaving due to wait, encouraged to stay

## 2015-03-17 ENCOUNTER — Ambulatory Visit (INDEPENDENT_AMBULATORY_CARE_PROVIDER_SITE_OTHER): Payer: Medicare PPO | Admitting: Nurse Practitioner

## 2015-03-17 ENCOUNTER — Encounter: Payer: Self-pay | Admitting: Nurse Practitioner

## 2015-03-17 VITALS — BP 125/73 | HR 110 | Ht 73.0 in | Wt 219.4 lb

## 2015-03-17 DIAGNOSIS — R413 Other amnesia: Secondary | ICD-10-CM

## 2015-03-17 DIAGNOSIS — F0390 Unspecified dementia without behavioral disturbance: Secondary | ICD-10-CM | POA: Diagnosis not present

## 2015-03-17 MED ORDER — MEMANTINE HCL 10 MG PO TABS: 10.0000 mg | ORAL_TABLET | Freq: Two times a day (BID) | ORAL | Status: DC

## 2015-03-17 NOTE — Patient Instructions (Signed)
Memory score is stable Continue Namenda twice daily, Use cane for stable ambulation at all times Follow-up in 6 months Recommend the 36 hour day for caregivers

## 2015-03-17 NOTE — Progress Notes (Signed)
[facility]  PATIENT: Marvin LOKEN Sr. DOB: 1920/07/10   REASON FOR VISIT: Follow-up for dementia  HISTORY FROM: Patient    HISTORY OF PRESENT ILLNESS:Marvin Bishop, 81 year old male returns for followup. He has a history of dementia . He stopped his Aricept in August 2015 due to GI effect. He was placed on Namenda at his last visit 09/14/2014 and denies side effects .His Mini-Mental Status exam is stable. He continues to live alone but has family to check on him frequently. He also has someone to administer his meds that is a paid caregiver. He is accompanied by a daughter today who is his POA she denies that there have been any safety issues identified. He continues to cook his breakfast but gets Meals on Wheels. MRI showed mild changes of chronic microvascular ischemia and generalized cerebral atrophy.He  had recent right eye removal due to detached retina He returns for reevaluation.   HISTORY: 81 years old right-handed male, accompanied by her daughter, referred by her primary care physician Dr. Benita Stabile for evaluation of memory trouble  He had a past medical history of hypertension, diabetes, hyperlipidemia, prostate cancer, had a history of a left knee reconstruction surgery  He had sixth-years of education, is a retired Sports coach in 2007, he lives alone, still driving short distance, over the past one year, his daughter noticed that he has become forgetful, he forget to take his medications, he misplace things, he often gets out of his car, forgot to close his car door, he also has mild gait difficulty, using a cane.  He denies sleep difficulty, has mild gait difficulty, he denies bowel and bladder incontinence, he can still pay his own bill, cooking simple meals without significant difficulty. He still lives by himself, he still drives short distance, has not got lost yet.   REVIEW OF SYSTEMS: Full 14 system review of systems performed and notable only for  those listed, all others are neg:  Constitutional: neg  Cardiovascular: neg Ear/Nose/Throat: neg  Skin: neg Eyes: Removal of right eye Respiratory: neg Gastroitestinal: neg  Hematology/Lymphatic: neg  Endocrine: neg Musculoskeletal: Gait abnormality Allergy/Immunology: neg Neurological: Memory loss Psychiatric: neg Sleep : neg   ALLERGIES: Allergies  Allergen Reactions  . Donepezil     Medication listed but no reaction stated; family not sure of reaction  . Hydrocodone Nausea And Vomiting    HOME MEDICATIONS: Outpatient Prescriptions Prior to Visit  Medication Sig Dispense Refill  . aspirin EC 81 MG tablet Take 81 mg by mouth daily.    Marland Kitchen atropine 1 % ophthalmic solution Place 1 drop into the right eye 2 (two) times daily.   4  . docusate sodium 100 MG CAPS Take 200 mg by mouth 2 (two) times daily as needed for mild constipation. 25 capsule 0  . dorzolamide-timolol (COSOPT) 22.3-6.8 MG/ML ophthalmic solution Place 1 drop into the right eye 2 (two) times daily.     . DUREZOL 0.05 % EMUL Place 1 drop into the right eye 2 (two) times daily.     Marland Kitchen esomeprazole (NEXIUM) 40 MG capsule Take 40 mg by mouth daily at 12 noon.    . furosemide (LASIX) 20 MG tablet Take 20 mg by mouth every morning.     . gabapentin (NEURONTIN) 100 MG capsule Take 200 mg by mouth daily as needed.     . Ginkgo Biloba (GINKGO PO) Take 2 capsules by mouth 2 (two) times daily. Gingko Smart Vitamin softgels    . glipiZIDE (  GLUCOTROL) 10 MG tablet Take 10 mg by mouth 2 (two) times daily before a meal.     . HYDROcodone-acetaminophen (NORCO/VICODIN) 5-325 MG per tablet Take 1 tablet by mouth every 6 (six) hours as needed for moderate pain.    Marland Kitchen latanoprost (XALATAN) 0.005 % ophthalmic solution Place 1 drop into the right eye at bedtime.     Marland Kitchen LEVEMIR FLEXTOUCH 100 UNIT/ML Pen Inject 15 Units into the skin daily at 10 pm.     . linagliptin (TRADJENTA) 5 MG TABS tablet Take 5 mg by mouth daily.    Marland Kitchen lisinopril  (PRINIVIL,ZESTRIL) 2.5 MG tablet Take 2.5 mg by mouth every morning.     . meloxicam (MOBIC) 7.5 MG tablet Take 7.5 mg by mouth daily as needed for pain.    . memantine (NAMENDA) 10 MG tablet Take 1 tablet (10 mg total) by mouth 2 (two) times daily. 60 tablet 6  . metroNIDAZOLE (FLAGYL) 500 MG tablet Take 1 tablet (500 mg total) by mouth 2 (two) times daily. One po bid x 7 days (Patient not taking: Reported on 03/17/2015) 14 tablet 0  . Multiple Vitamin (MULTIVITAMIN WITH MINERALS) TABS tablet Take 1 tablet by mouth daily.    . polyethylene glycol (MIRALAX / GLYCOLAX) packet Take 17 g by mouth 2 (two) times daily as needed. (Patient taking differently: Take 17 g by mouth 2 (two) times daily as needed (taking 3 x a week). ) 28 each 0  . ranitidine (ZANTAC) 75 MG tablet Take 75 mg by mouth 2 (two) times daily.    . sildenafil (VIAGRA) 50 MG tablet Take 50 mg by mouth daily as needed for erectile dysfunction.    . simvastatin (ZOCOR) 40 MG tablet Take 40 mg by mouth every morning.     . tamsulosin (FLOMAX) 0.4 MG CAPS capsule Take 0.4 mg by mouth daily after breakfast.    . temazepam (RESTORIL) 30 MG capsule Take 30 mg by mouth at bedtime as needed for sleep.     No facility-administered medications prior to visit.    PAST MEDICAL HISTORY: Past Medical History  Diagnosis Date  . Arthritis   . Diabetes mellitus   . Hypertension   . Hypercholesteremia   . Insomnia     occasional  . Nephrolithiasis     right UVJ stone  . Dementia   . Prostate cancer   . Memory loss   . Headache(784.0)     slight  . Asthma     hx of as child  . PONV (postoperative nausea and vomiting)   . Complete loss of vision     right eye  . Peripheral neuropathy     both legs    PAST SURGICAL HISTORY: Past Surgical History  Procedure Laterality Date  . Appendectomy    . Pars plana vitrectomy  10/24/2011    Procedure: PARS PLANA VITRECTOMY WITH 23 GAUGE;  Surgeon: Adonis Brook, MD;  Location: [place];  Service:  Ophthalmology;  Laterality: Right;  . Tonsillectomy  as teen  . Knee arthroscopy Right   . Hemorroidectomy    . Cystoscopy w/ ureteral stent placement Right 10/01/2013    Procedure: cystoscopy right retrograde holmium laser lithotripsy;  Surgeon: Franchot Gallo, MD;  Location: [facility];  Service: Urology;  Laterality: Right;  holmium laser application    FAMILY HISTORY: Family History  Problem Relation Age of Onset  . Cancer Mother     uterus  . Heart Problems Father   . Cancer Brother  stomach  . Cancer Brother     throat  . Colon cancer Brother     SOCIAL HISTORY: History   Social History  . Marital Status: Widowed    Spouse Name: N/A  . Number of Children: 5  . Years of Education: 6 th   Occupational History  .      Retired   Social History Main Topics  . Smoking status: Former Smoker -- 0.25 packs/day    Types: Cigarettes    Quit date: 10/15/1966  . Smokeless tobacco: Never Used  . Alcohol Use: 0.0 oz/week     Comment: occasional  . Drug Use: No  . Sexual Activity: Not on file   Other Topics Concern  . Not on file   Social History Narrative   Patient lives at home alone with dog.   Retired.   Education.- 6 th  Grade   Right handed.   Caffeine-Soda , tea and coffee                 PHYSICAL EXAM  Filed Vitals:   03/17/15 1533  BP: 125/73  Pulse: 110  Height: 6\' 1"  (1.854 m)  Weight: 219 lb 6.4 oz (99.519 kg)   Body mass index is 28.95 kg/(m^2). Generalized: Well developed, in no acute distress  Head: normocephalic and atraumatic,. Oropharynx benign  Musculoskeletal: No deformity   Neurological examination   Mentation: Alert , MMSE 20/30 last 18/30 missing items in orientation , calculation and 3 out of 3 recall. AFT 6, Clock drawing 2/4.Follows all commands speech and language fluent  Cranial nerve II-XII: Left Pupil were equal round reactive to light. Right eye removal  extraocular movements were full, visual field were full on  confrontational test. Facial sensation and strength were normal. hearing was intact to finger rubbing bilaterally. Uvula tongue midline. head turning and shoulder shrug were normal and symmetric.Tongue protrusion into cheek strength was normal. Motor: normal bulk and tone, full strength in the BUE, BLE, fine finger movements normal, no pronator drift. No focal weakness Coordination: finger-nose-finger, heel-to-shin bilaterally, no dysmetria Reflexes: Brachioradialis 2/2, biceps 2/2, triceps 2/2, patellar 2/2, Achilles absent, plantar responses were flexor bilaterally. Gait and Station: Rising up from seated position without assistance, cautious steady gait, stooped posture with single-point cane    DIAGNOSTIC DATA (LABS, IMAGING, TESTING) - I reviewed patient records, labs, notes, testing and imaging myself where available.  Lab Results  Component Value Date   WBC 8.5 03/08/2015   HGB 13.7 03/08/2015   HCT 41.2 03/08/2015   MCV 81.3 03/08/2015   PLT 189 03/08/2015      Component Value Date/Time   NA 137 03/08/2015 1816   K 3.9 03/08/2015 1816   CL 102 03/08/2015 1816   CO2 23 03/08/2015 1816   GLUCOSE 316* 03/08/2015 1816   BUN 13 03/08/2015 1816   CREATININE 1.27* 03/08/2015 1816   CALCIUM 9.6 03/08/2015 1816   PROT 7.5 03/08/2015 1816   ALBUMIN 3.6 03/08/2015 1816   AST 27 03/08/2015 1816   ALT 17 03/08/2015 1816   ALKPHOS 83 03/08/2015 1816   BILITOT 0.7 03/08/2015 1816   GFRNONAA 49* 03/08/2015 1816   GFRAA 14* 03/08/2015 1816     ASSESSMENT AND PLAN  81 y.o. year old male  has a past medical history of dementia here to follow-up. He is currently on Namenda  with stable Mini-Mental status exam  Memory score is stable Continue Namenda twice daily, Use cane for stable ambulation at all times Follow-up  in 6 months Recommend the 36 hour day for caregivers Dennie Bible, [facility], [facility], APRN  [facility] [address], [place], [place]  [address] [phone]

## 2015-03-18 NOTE — Progress Notes (Signed)
I have reviewed and agreed above plan. 

## 2015-03-25 ENCOUNTER — Telehealth: Payer: Self-pay | Admitting: Oncology

## 2015-03-25 NOTE — Progress Notes (Signed)
I have reviewed and agreed above plan. 

## 2015-03-25 NOTE — Telephone Encounter (Signed)
New patieent appt-s/w patient dtr [name] and gave np appt for 06/28 @ 10:30 w/Dr. Alen Blew.  Referring Dr. Erling Cruz Dx- Monoclonal gammopathy

## 2015-04-01 ENCOUNTER — Encounter (HOSPITAL_COMMUNITY): Payer: Self-pay | Admitting: *Deleted

## 2015-04-01 ENCOUNTER — Emergency Department (HOSPITAL_COMMUNITY): Payer: Medicare PPO

## 2015-04-01 ENCOUNTER — Observation Stay (HOSPITAL_BASED_OUTPATIENT_CLINIC_OR_DEPARTMENT_OTHER)
Admission: EM | Admit: 2015-04-01 | Discharge: 2015-04-04 | Disposition: A | Discharge status: Home / Self Care | Payer: Medicare PPO | Source: Home / Self Care | Attending: Emergency Medicine | Admitting: Emergency Medicine

## 2015-04-01 DIAGNOSIS — I129 Hypertensive chronic kidney disease with stage 1 through stage 4 chronic kidney disease, or unspecified chronic kidney disease: Secondary | ICD-10-CM

## 2015-04-01 DIAGNOSIS — Z800 Family history of malignant neoplasm of digestive organs: Secondary | ICD-10-CM | POA: Diagnosis not present

## 2015-04-01 DIAGNOSIS — H5441 Blindness, right eye, normal vision left eye: Secondary | ICD-10-CM | POA: Diagnosis present

## 2015-04-01 DIAGNOSIS — M6281 Muscle weakness (generalized): Secondary | ICD-10-CM | POA: Diagnosis not present

## 2015-04-01 DIAGNOSIS — E785 Hyperlipidemia, unspecified: Secondary | ICD-10-CM

## 2015-04-01 DIAGNOSIS — R110 Nausea: Secondary | ICD-10-CM | POA: Diagnosis not present

## 2015-04-01 DIAGNOSIS — R531 Weakness: Secondary | ICD-10-CM

## 2015-04-01 DIAGNOSIS — R262 Difficulty in walking, not elsewhere classified: Secondary | ICD-10-CM | POA: Diagnosis not present

## 2015-04-01 DIAGNOSIS — Z888 Allergy status to other drugs, medicaments and biological substances status: Secondary | ICD-10-CM | POA: Insufficient documentation

## 2015-04-01 DIAGNOSIS — I10 Essential (primary) hypertension: Secondary | ICD-10-CM | POA: Diagnosis not present

## 2015-04-01 DIAGNOSIS — R000 Tachycardia, unspecified: Secondary | ICD-10-CM | POA: Diagnosis present

## 2015-04-01 DIAGNOSIS — E860 Dehydration: Secondary | ICD-10-CM | POA: Diagnosis present

## 2015-04-01 DIAGNOSIS — R748 Abnormal levels of other serum enzymes: Secondary | ICD-10-CM | POA: Diagnosis present

## 2015-04-01 DIAGNOSIS — G629 Polyneuropathy, unspecified: Secondary | ICD-10-CM

## 2015-04-01 DIAGNOSIS — R1030 Lower abdominal pain, unspecified: Secondary | ICD-10-CM

## 2015-04-01 DIAGNOSIS — E118 Type 2 diabetes mellitus with unspecified complications: Secondary | ICD-10-CM | POA: Diagnosis not present

## 2015-04-01 DIAGNOSIS — Z8639 Personal history of other endocrine, nutritional and metabolic disease: Secondary | ICD-10-CM

## 2015-04-01 DIAGNOSIS — Z7982 Long term (current) use of aspirin: Secondary | ICD-10-CM

## 2015-04-01 DIAGNOSIS — E780 Pure hypercholesterolemia: Secondary | ICD-10-CM

## 2015-04-01 DIAGNOSIS — N183 Chronic kidney disease, stage 3 (moderate): Secondary | ICD-10-CM | POA: Insufficient documentation

## 2015-04-01 DIAGNOSIS — Z8546 Personal history of malignant neoplasm of prostate: Secondary | ICD-10-CM | POA: Diagnosis not present

## 2015-04-01 DIAGNOSIS — Z885 Allergy status to narcotic agent status: Secondary | ICD-10-CM | POA: Insufficient documentation

## 2015-04-01 DIAGNOSIS — I471 Supraventricular tachycardia: Secondary | ICD-10-CM | POA: Diagnosis not present

## 2015-04-01 DIAGNOSIS — Z79899 Other long term (current) drug therapy: Secondary | ICD-10-CM

## 2015-04-01 DIAGNOSIS — Z87442 Personal history of urinary calculi: Secondary | ICD-10-CM

## 2015-04-01 DIAGNOSIS — I152 Hypertension secondary to endocrine disorders: Secondary | ICD-10-CM | POA: Diagnosis present

## 2015-04-01 DIAGNOSIS — E119 Type 2 diabetes mellitus without complications: Secondary | ICD-10-CM | POA: Diagnosis present

## 2015-04-01 DIAGNOSIS — R278 Other lack of coordination: Secondary | ICD-10-CM | POA: Diagnosis not present

## 2015-04-01 DIAGNOSIS — N189 Chronic kidney disease, unspecified: Secondary | ICD-10-CM | POA: Diagnosis present

## 2015-04-01 DIAGNOSIS — Z5189 Encounter for other specified aftercare: Secondary | ICD-10-CM | POA: Diagnosis not present

## 2015-04-01 DIAGNOSIS — Z87891 Personal history of nicotine dependence: Secondary | ICD-10-CM | POA: Diagnosis not present

## 2015-04-01 DIAGNOSIS — I2699 Other pulmonary embolism without acute cor pulmonale: Principal | ICD-10-CM | POA: Diagnosis present

## 2015-04-01 DIAGNOSIS — E1121 Type 2 diabetes mellitus with diabetic nephropathy: Secondary | ICD-10-CM | POA: Insufficient documentation

## 2015-04-01 DIAGNOSIS — N179 Acute kidney failure, unspecified: Secondary | ICD-10-CM

## 2015-04-01 DIAGNOSIS — F0390 Unspecified dementia without behavioral disturbance: Secondary | ICD-10-CM | POA: Diagnosis present

## 2015-04-01 DIAGNOSIS — R079 Chest pain, unspecified: Secondary | ICD-10-CM | POA: Diagnosis not present

## 2015-04-01 DIAGNOSIS — K219 Gastro-esophageal reflux disease without esophagitis: Secondary | ICD-10-CM | POA: Insufficient documentation

## 2015-04-01 DIAGNOSIS — M1990 Unspecified osteoarthritis, unspecified site: Secondary | ICD-10-CM | POA: Diagnosis not present

## 2015-04-01 DIAGNOSIS — I248 Other forms of acute ischemic heart disease: Secondary | ICD-10-CM | POA: Diagnosis present

## 2015-04-01 DIAGNOSIS — R072 Precordial pain: Secondary | ICD-10-CM | POA: Diagnosis not present

## 2015-04-01 DIAGNOSIS — Z794 Long term (current) use of insulin: Secondary | ICD-10-CM

## 2015-04-01 DIAGNOSIS — E1159 Type 2 diabetes mellitus with other circulatory complications: Secondary | ICD-10-CM | POA: Diagnosis present

## 2015-04-01 DIAGNOSIS — R0602 Shortness of breath: Secondary | ICD-10-CM | POA: Diagnosis not present

## 2015-04-01 DIAGNOSIS — R269 Unspecified abnormalities of gait and mobility: Secondary | ICD-10-CM | POA: Diagnosis not present

## 2015-04-01 DIAGNOSIS — R109 Unspecified abdominal pain: Secondary | ICD-10-CM | POA: Diagnosis not present

## 2015-04-01 DIAGNOSIS — R05 Cough: Secondary | ICD-10-CM | POA: Diagnosis not present

## 2015-04-01 LAB — URINALYSIS, ROUTINE W REFLEX MICROSCOPIC
Bilirubin Urine: NEGATIVE
Glucose, UA: NEGATIVE mg/dL
HGB URINE DIPSTICK: NEGATIVE
Ketones, ur: NEGATIVE mg/dL
Leukocytes, UA: NEGATIVE
Nitrite: NEGATIVE
Protein, ur: NEGATIVE mg/dL
Specific Gravity, Urine: 1.007 (ref 1.005–1.030)
Urobilinogen, UA: 0.2 mg/dL (ref 0.0–1.0)
pH: 5 (ref 5.0–8.0)

## 2015-04-01 LAB — COMPREHENSIVE METABOLIC PANEL
ALBUMIN: 3.5 g/dL (ref 3.5–5.0)
ALK PHOS: 79 U/L (ref 38–126)
ALT: 17 U/L (ref 17–63)
AST: 28 U/L (ref 15–41)
Anion gap: 7 (ref 5–15)
BUN: 20 mg/dL (ref 6–20)
CHLORIDE: 100 mmol/L — AB (ref 101–111)
CO2: 26 mmol/L (ref 22–32)
Calcium: 9.5 mg/dL (ref 8.9–10.3)
Creatinine, Ser: 1.52 mg/dL — ABNORMAL HIGH (ref 0.61–1.24)
GFR calc Af Amer: 44 mL/min — ABNORMAL LOW (ref >60)
GFR calc non Af Amer: 38 mL/min — ABNORMAL LOW (ref >60)
Glucose, Bld: 222 mg/dL — ABNORMAL HIGH (ref 65–99)
POTASSIUM: 4.7 mmol/L (ref 3.5–5.1)
SODIUM: 133 mmol/L — AB (ref 135–145)
TOTAL PROTEIN: 7.6 g/dL (ref 6.5–8.1)
Total Bilirubin: 1.1 mg/dL (ref 0.3–1.2)

## 2015-04-01 LAB — CBC WITH DIFFERENTIAL/PLATELET
Basophils Absolute: 0 10*3/uL (ref 0.0–0.1)
Basophils Relative: 0 % (ref 0–1)
Eosinophils Absolute: 0.2 10*3/uL (ref 0.0–0.7)
Eosinophils Relative: 2 % (ref 0–5)
HCT: 41.5 % (ref 39.0–52.0)
Hemoglobin: 14 g/dL (ref 13.0–17.0)
LYMPHS ABS: 1.3 10*3/uL (ref 0.7–4.0)
Lymphocytes Relative: 12 % (ref 12–46)
MCH: 27.7 pg (ref 26.0–34.0)
MCHC: 33.7 g/dL (ref 30.0–36.0)
MCV: 82.2 fL (ref 78.0–100.0)
Monocytes Absolute: 1.4 10*3/uL — ABNORMAL HIGH (ref 0.1–1.0)
Monocytes Relative: 13 % — ABNORMAL HIGH (ref 3–12)
NEUTROS ABS: 7.8 10*3/uL — AB (ref 1.7–7.7)
NEUTROS PCT: 73 % (ref 43–77)
PLATELETS: 199 10*3/uL (ref 150–400)
RBC: 5.05 MIL/uL (ref 4.22–5.81)
RDW: 14.5 % (ref 11.5–15.5)
WBC: 10.7 10*3/uL — ABNORMAL HIGH (ref 4.0–10.5)

## 2015-04-01 LAB — GLUCOSE, CAPILLARY: GLUCOSE-CAPILLARY: 133 mg/dL — AB (ref 65–99)

## 2015-04-01 LAB — TROPONIN I: Troponin I: 0.03 ng/mL (ref <0.031)

## 2015-04-01 LAB — I-STAT CG4 LACTIC ACID, ED: Lactic Acid, Venous: 2.44 mmol/L (ref 0.5–2.0)

## 2015-04-01 LAB — TSH: TSH: 1.989 u[IU]/mL (ref 0.350–4.500)

## 2015-04-01 LAB — LIPASE, BLOOD: Lipase: 33 U/L (ref 22–51)

## 2015-04-01 MED ORDER — INSULIN DETEMIR 100 UNIT/ML ~~LOC~~ SOLN
15.0000 [IU] | Freq: Every day | SUBCUTANEOUS | Status: DC
  Administered 2015-04-01 – 2015-04-03 (×3): 15 [IU] via SUBCUTANEOUS
  Filled 2015-04-01 (×4): qty 0.15

## 2015-04-01 MED ORDER — HYDROCODONE-ACETAMINOPHEN 5-325 MG PO TABS
1.0000 | ORAL_TABLET | Freq: Four times a day (QID) | ORAL | Status: DC | PRN
  Administered 2015-04-02 – 2015-04-03 (×3): 1 via ORAL
  Filled 2015-04-01 (×3): qty 1

## 2015-04-01 MED ORDER — SODIUM CHLORIDE 0.9 % IV BOLUS (SEPSIS)
1000.0000 mL | Freq: Once | INTRAVENOUS | Status: AC
  Administered 2015-04-01: 1000 mL via INTRAVENOUS

## 2015-04-01 MED ORDER — MEMANTINE HCL 10 MG PO TABS
10.0000 mg | ORAL_TABLET | Freq: Two times a day (BID) | ORAL | Status: DC
  Administered 2015-04-01 – 2015-04-04 (×6): 10 mg via ORAL
  Filled 2015-04-01 (×7): qty 1

## 2015-04-01 MED ORDER — SODIUM CHLORIDE 0.9 % IV SOLN
INTRAVENOUS | Status: DC
  Administered 2015-04-01 – 2015-04-04 (×4): via INTRAVENOUS

## 2015-04-01 MED ORDER — PANTOPRAZOLE SODIUM 40 MG PO TBEC
80.0000 mg | DELAYED_RELEASE_TABLET | Freq: Every day | ORAL | Status: DC
  Administered 2015-04-02 – 2015-04-04 (×3): 80 mg via ORAL
  Filled 2015-04-01 (×3): qty 2

## 2015-04-01 MED ORDER — ONDANSETRON HCL 4 MG PO TABS
4.0000 mg | ORAL_TABLET | Freq: Three times a day (TID) | ORAL | Status: DC | PRN
  Administered 2015-04-03: 4 mg via ORAL
  Filled 2015-04-01: qty 1

## 2015-04-01 MED ORDER — LINAGLIPTIN 5 MG PO TABS
5.0000 mg | ORAL_TABLET | Freq: Every day | ORAL | Status: DC
  Administered 2015-04-02 – 2015-04-04 (×3): 5 mg via ORAL
  Filled 2015-04-01 (×3): qty 1

## 2015-04-01 MED ORDER — DOCUSATE SODIUM 100 MG PO CAPS: 200.0000 mg | ORAL_CAPSULE | Freq: Two times a day (BID) | ORAL | Status: DC | PRN

## 2015-04-01 MED ORDER — HEPARIN SODIUM (PORCINE) 5000 UNIT/ML IJ SOLN
5000.0000 [IU] | Freq: Three times a day (TID) | INTRAMUSCULAR | Status: DC
  Administered 2015-04-01 – 2015-04-04 (×9): 5000 [IU] via SUBCUTANEOUS
  Filled 2015-04-01 (×11): qty 1

## 2015-04-01 MED ORDER — GLIPIZIDE 10 MG PO TABS
10.0000 mg | ORAL_TABLET | Freq: Two times a day (BID) | ORAL | Status: DC
  Administered 2015-04-02 – 2015-04-04 (×6): 10 mg via ORAL
  Filled 2015-04-01 (×7): qty 1

## 2015-04-01 MED ORDER — SIMVASTATIN 40 MG PO TABS
40.0000 mg | ORAL_TABLET | Freq: Every morning | ORAL | Status: DC
  Administered 2015-04-02 – 2015-04-04 (×3): 40 mg via ORAL
  Filled 2015-04-01 (×3): qty 1

## 2015-04-01 MED ORDER — LISINOPRIL 2.5 MG PO TABS
2.5000 mg | ORAL_TABLET | Freq: Every morning | ORAL | Status: DC
  Administered 2015-04-02 – 2015-04-03 (×2): 2.5 mg via ORAL
  Filled 2015-04-01 (×2): qty 1

## 2015-04-01 MED ORDER — INSULIN ASPART 100 UNIT/ML ~~LOC~~ SOLN
0.0000 [IU] | Freq: Three times a day (TID) | SUBCUTANEOUS | Status: DC
  Administered 2015-04-02: 2 [IU] via SUBCUTANEOUS
  Administered 2015-04-02: 3 [IU] via SUBCUTANEOUS
  Administered 2015-04-03: 5 [IU] via SUBCUTANEOUS

## 2015-04-01 MED ORDER — ASPIRIN EC 81 MG PO TBEC
81.0000 mg | DELAYED_RELEASE_TABLET | Freq: Every day | ORAL | Status: DC
  Administered 2015-04-02 – 2015-04-04 (×3): 81 mg via ORAL
  Filled 2015-04-01 (×4): qty 1

## 2015-04-01 MED ORDER — MELOXICAM 7.5 MG PO TABS
7.5000 mg | ORAL_TABLET | Freq: Every day | ORAL | Status: DC | PRN
  Administered 2015-04-02: 7.5 mg via ORAL
  Filled 2015-04-01 (×2): qty 1

## 2015-04-01 NOTE — ED Notes (Signed)
Patient transported to CT 

## 2015-04-01 NOTE — ED Notes (Signed)
Pt ambulated in room with Dr. Reather Converse present, tolerated well.

## 2015-04-01 NOTE — Progress Notes (Signed)
Report received from [place], [place] in ED. Awaiting pt's arrival.

## 2015-04-01 NOTE — Progress Notes (Signed)
NURSING PROGRESS NOTE  Marvin Bishop [id number] Admission Data: 04/01/2015 11:48 PM Attending Provider: Etta Quill, DO YNW:GNFAOZ,HYQMVH, MD Code Status: Full   Marvin Barns Sr. is a 81 y.o. male patient admitted from ED:  -No acute distress noted.  -No complaints of shortness of breath.  -No complaints of chest pain.   Cardiac Monitoring: Box # 26 in place. Cardiac monitor yields:ST  Blood pressure 124/83, pulse 119, temperature 99.6 F (37.6 C), temperature source Oral, resp. rate 18, SpO2 100 %.   IV Fluids:  IV in place, occlusive dsg intact without redness, IV cath  Intact in Rt hand  Allergies:  Donepezil and Hydrocodone  Past Medical History:   has a past medical history of Arthritis; Diabetes mellitus; Hypertension; Hypercholesteremia; Insomnia; Nephrolithiasis; Dementia; Prostate cancer; Memory loss; Headache(784.0); Asthma; PONV (postoperative nausea and vomiting); Complete loss of vision; and Peripheral neuropathy.  Past Surgical History:   has past surgical history that includes Appendectomy; Pars plana vitrectomy (10/24/2011); Tonsillectomy (as teen); Knee arthroscopy (Right); Hemorroidectomy; Cystoscopy w/ ureteral stent placement (Right, 10/01/2013); and Cornea removed (Right, 02-26-15).  Social History:   reports that he quit smoking about 48 years ago. His smoking use included Cigarettes. He smoked 0.25 packs per day. He has never used smokeless tobacco. He reports that he drinks alcohol. He reports that he does not use illicit drugs.  Skin: Intact  Admission inpatient armband information verified with patient/family to include name and date of birth and placed on patient arm. Side rails up x 3, fall assessment and education completed with patient/family. Patient/family able to verbalize understanding of risk associated with falls and verbalized understanding to call for assistance before getting out of bed. Call light within reach. Patient/family able to voice  and demonstrate understanding of unit orientation instructions.

## 2015-04-01 NOTE — ED Notes (Signed)
Bobbye Morton (daughter) requesting to be called when patient is placed in a room in the ER. Sts pt is demented and will not be able to answer questions appropriately. Pt does not have wandering behaviors and is oriented to self.  [phone]

## 2015-04-01 NOTE — ED Provider Notes (Signed)
CSN: [id number]     Arrival date & time 04/01/15  1506 History   First MD Initiated Contact with Patient 04/01/15 1727     Chief Complaint  Patient presents with  . Weakness     (Consider location/radiation/quality/duration/timing/severity/associated sxs/prior Treatment) HPI Comments: 81 year old male with history of diverticulosis, memory loss, dementia, blindness in one eye, kidney stone presents with decreased appetite, lower abdominal discomfort, general weakness for the past few days. No fevers at home. Mild cough, no urinary changes. Poor appetite. No vomiting or blood in the stools. Patient has dementia history at baseline, daughter assisting. Nothing is improved symptoms. Patient has had bowel movement.  Patient is a 81 y.o. male presenting with weakness. The history is provided by the patient and a relative.  Weakness    Past Medical History  Diagnosis Date  . Arthritis   . Diabetes mellitus   . Hypertension   . Hypercholesteremia   . Insomnia     occasional  . Nephrolithiasis     right UVJ stone  . Dementia   . Prostate cancer   . Memory loss   . Headache(784.0)     slight  . Asthma     hx of as child  . PONV (postoperative nausea and vomiting)   . Complete loss of vision     right eye  . Peripheral neuropathy     both legs   Past Surgical History  Procedure Laterality Date  . Appendectomy    . Pars plana vitrectomy  10/24/2011    Procedure: PARS PLANA VITRECTOMY WITH 23 GAUGE;  Surgeon: Adonis Brook, MD;  Location: [facility];  Service: Ophthalmology;  Laterality: Right;  . Tonsillectomy  as teen  . Knee arthroscopy Right   . Hemorroidectomy    . Cystoscopy w/ ureteral stent placement Right 10/01/2013    Procedure: cystoscopy right retrograde holmium laser lithotripsy;  Surgeon: Franchot Gallo, MD;  Location: [facility];  Service: Urology;  Laterality: Right;  holmium laser application  . Cornea removed Right 02-26-15   Family History  Problem Relation Age of  Onset  . Cancer Mother     uterus  . Heart Problems Father   . Cancer Brother     stomach  . Cancer Brother     throat  . Colon cancer Brother    History  Substance Use Topics  . Smoking status: Former Smoker -- 0.25 packs/day    Types: Cigarettes    Quit date: 10/15/1966  . Smokeless tobacco: Never Used  . Alcohol Use: 0.0 oz/week     Comment: occasional    Review of Systems  Unable to perform ROS: Dementia  Neurological: Positive for weakness.      Allergies  Donepezil and Hydrocodone  Home Medications   Prior to Admission medications   Medication Sig Start Date End Date Taking? Authorizing Provider  aspirin EC 81 MG tablet Take 81 mg by mouth daily.   Yes Historical Provider, MD  docusate sodium 100 MG CAPS Take 200 mg by mouth 2 (two) times daily as needed for mild constipation. 08/25/14  Yes Thurnell Lose, MD  esomeprazole (NEXIUM) 40 MG capsule Take 40 mg by mouth daily at 12 noon.   Yes Historical Provider, MD  furosemide (LASIX) 20 MG tablet Take 20 mg by mouth See admin instructions. Take on Mondays Wednesday and Friday   Yes Historical Provider, MD  Ginkgo Biloba (GINKGO PO) Take 2 capsules by mouth 2 (two) times daily. Gingko Smart Vitamin softgels  Yes Historical Provider, MD  glipiZIDE (GLUCOTROL) 10 MG tablet Take 10 mg by mouth 2 (two) times daily before a meal.    Yes Historical Provider, MD  HYDROcodone-acetaminophen (NORCO/VICODIN) 5-325 MG per tablet Take 1 tablet by mouth every 6 (six) hours as needed for moderate pain.   Yes Historical Provider, MD  LEVEMIR FLEXTOUCH 100 UNIT/ML Pen Inject 15 Units into the skin daily at 10 pm.  02/09/14  Yes Historical Provider, MD  linagliptin (TRADJENTA) 5 MG TABS tablet Take 5 mg by mouth daily.   Yes Historical Provider, MD  lisinopril (PRINIVIL,ZESTRIL) 2.5 MG tablet Take 2.5 mg by mouth every morning.    Yes Historical Provider, MD  memantine (NAMENDA) 10 MG tablet Take 1 tablet (10 mg total) by mouth 2 (two)  times daily. 03/17/15  Yes Dennie Bible, NP  metFORMIN (GLUCOPHAGE) 500 MG tablet Take 500 mg by mouth 2 (two) times daily with a meal.  03/16/15  Yes Historical Provider, MD  Multiple Vitamin (MULTIVITAMIN WITH MINERALS) TABS tablet Take 1 tablet by mouth daily.   Yes Historical Provider, MD  ondansetron (ZOFRAN) 4 MG tablet Take 4 mg by mouth every 8 (eight) hours as needed for nausea or vomiting.  03/08/15  Yes Historical Provider, MD  ranitidine (ZANTAC) 75 MG tablet Take 75 mg by mouth 2 (two) times daily.   Yes Historical Provider, MD  simvastatin (ZOCOR) 40 MG tablet Take 40 mg by mouth every morning.    Yes Historical Provider, MD  meloxicam (MOBIC) 7.5 MG tablet Take 7.5 mg by mouth daily as needed for pain.    Historical Provider, MD   BP 124/83 mmHg  Pulse 119  Temp(Src) 99.6 F (37.6 C) (Oral)  Resp 18  Ht 6\' 2"  (1.88 m)  Wt 212 lb 12.8 oz (96.525 kg)  BMI 27.31 kg/m2  SpO2 100% Physical Exam  Constitutional: He appears well-developed and well-nourished.  HENT:  Head: Normocephalic and atraumatic.  Dry mucous membranes  Eyes: Right eye exhibits no discharge. Left eye exhibits no discharge.  Neck: Normal range of motion. Neck supple. No tracheal deviation present.  Cardiovascular: Regular rhythm.  Tachycardia present.   Pulmonary/Chest: Effort normal and breath sounds normal.  Abdominal: Soft. He exhibits no distension. There is tenderness (mild suprapubic). There is no guarding.  Musculoskeletal: He exhibits no edema.  Neurological: He is alert.  Patient moves all extremities equal 5+ strength. Fatigue appearance. Patient has cautious gait. Pleasant dementia  Skin: Skin is warm. No rash noted.  Psychiatric:  Pleasant dementia  Nursing note and vitals reviewed.   ED Course  Procedures (including critical care time) Labs Review Labs Reviewed  CBC WITH DIFFERENTIAL/PLATELET - Abnormal; Notable for the following:    WBC 10.7 (*)    Neutro Abs 7.8 (*)    Monocytes  Relative 13 (*)    Monocytes Absolute 1.4 (*)    All other components within normal limits  COMPREHENSIVE METABOLIC PANEL - Abnormal; Notable for the following:    Sodium 133 (*)    Chloride 100 (*)    Glucose, Bld 222 (*)    Creatinine, Ser 1.52 (*)    GFR calc non Af Amer 38 (*)    GFR calc Af Amer 44 (*)    All other components within normal limits  GLUCOSE, CAPILLARY - Abnormal; Notable for the following:    Glucose-Capillary 133 (*)    All other components within normal limits  I-STAT CG4 LACTIC ACID, ED - Abnormal; Notable for  the following:    Lactic Acid, Venous 2.44 (*)    All other components within normal limits  LIPASE, BLOOD  TROPONIN I  URINALYSIS, ROUTINE W REFLEX MICROSCOPIC (NOT AT [facility])  TSH  CBC  BASIC METABOLIC PANEL    Imaging Review Dg Chest Portable 1 View  04/01/2015   CLINICAL DATA:  Shortness of breath, weakness  EXAM: PORTABLE CHEST - 1 VIEW  COMPARISON:  08/23/2014  FINDINGS: Cardiac leads obscure detail. Right AC joint degenerative change. Heart size is mildly enlarged without evidence for edema. No focal pulmonary opacity. No pleural effusion.  IMPRESSION: Mild cardiomegaly without focal acute finding.   Electronically Signed   By: Conchita Paris M.D.   On: 04/01/2015 18:39   Ct Renal Stone Study  04/01/2015   CLINICAL DATA:  81 year old male with nausea abdominal pain, abdominal distention, shortness of breath, weakness. Initial encounter.  EXAM: CT ABDOMEN AND PELVIS WITHOUT CONTRAST  TECHNIQUE: Multidetector CT imaging of the abdomen and pelvis was performed following the standard protocol without IV contrast.  COMPARISON:  CT Abdomen and Pelvis 08/23/2014.  FINDINGS: Mild motion artifact at the lung bases. Stable cardiomegaly. No pericardial effusion. Stable scarring or atelectasis in the right costophrenic angle.  Advanced degenerative changes in the spine with bulky osteophytosis. Multilevel lumbar spinal stenosis. No acute osseous abnormality  identified.  Sequelae of prostatectomy. No pelvic free fluid. Diminutive bladder. Negative rectum. Decompressed small bowel loops in the pelvis.  Diverticulosis of the sigmoid colon with no active inflammation identified. Negative left colon aside from retained stool. Redundant transverse colon with retained stool. Negative right colon. Normal appendix. Negative terminal ileum. No dilated small bowel small hiatal hernia. Decompressed stomach. Negative duodenum.  Noncontrast liver with chronic left lateral lobe hemangioma which is better demonstrated on the prior contrast-enhanced study. Negative noncontrast CT appearance of the gallbladder. Noncontrast spleen, pancreas and adrenal glands are within normal limits. No hydronephrosis. Chronic left renal cysts. No nephrolithiasis. No hydroureter or ureteral calculus. No abdominal free fluid. Aortoiliac calcified atherosclerosis noted. No lymphadenopathy identified. Small fat containing inguinal hernias are stable.  IMPRESSION: 1. No urologic calculi. No acute or inflammatory process identified in the abdomen or pelvis. 2. Chronic findings including sigmoid diverticulosis, Aortoiliac calcified atherosclerosis, Advanced spine degeneration with lumbar spinal stenosis.   Electronically Signed   By: Genevie Ann M.D.   On: 04/01/2015 18:56     EKG Interpretation   Date/Time:  Friday April 01 2015 15:19:17 EDT Ventricular Rate:  127 PR Interval:  154 QRS Duration: 90 QT Interval:  292 QTC Calculation: 424 R Axis:   -25 Text Interpretation:  Sinus tachycardia Otherwise normal ECG Confirmed by  Jiles Goya  MD, Estevan Kersh ([id number]) on 04/01/2015 5:38:36 PM      MDM   Final diagnoses:  Lower abdominal pain  Dehydration  Acute renal failure, unspecified acute renal failure type  Sinus tachycardia   Elderly patient presents with worsening nausea, general weakness and tachycardia. Patient clinically dehydrated, IV fluid bolus ordered. Blood work showed worsening renal  function and electrolyte abnormalities mild. Minimal abdominal tenderness on exam, poor kidney function, screening CT without contrast to look for any significant abnormality, patient does have kidney stone history. Urinalysis pending for possible infection that would explain majority of his symptoms. With age, tachycardia and worsening symptoms plan for observation the hospital.  The patients results and plan were reviewed and discussed.   Any x-rays performed were personally reviewed by myself.   Differential diagnosis were considered with the presenting  HPI.  Medications  heparin injection 5,000 Units (5,000 Units Subcutaneous Given 04/01/15 2150)  0.9 %  sodium chloride infusion ( Intravenous New Bag/Given 04/01/15 2132)  aspirin EC tablet 81 mg (81 mg Oral Not Given 04/01/15 2131)  glipiZIDE (GLUCOTROL) tablet 10 mg (not administered)  pantoprazole (PROTONIX) EC tablet 80 mg (not administered)  lisinopril (PRINIVIL,ZESTRIL) tablet 2.5 mg (not administered)  meloxicam (MOBIC) tablet 7.5 mg (not administered)  linagliptin (TRADJENTA) tablet 5 mg (not administered)  memantine (NAMENDA) tablet 10 mg (10 mg Oral Given 04/01/15 2131)  simvastatin (ZOCOR) tablet 40 mg (not administered)  ondansetron (ZOFRAN) tablet 4 mg (not administered)  docusate sodium (COLACE) capsule 200 mg (not administered)  insulin detemir (LEVEMIR) injection 15 Units (15 Units Subcutaneous Given 04/01/15 2132)  HYDROcodone-acetaminophen (NORCO/VICODIN) 5-325 MG per tablet 1 tablet (not administered)  insulin aspart (novoLOG) injection 0-9 Units (not administered)  sodium chloride 0.9 % bolus 1,000 mL (0 mLs Intravenous Stopped 04/01/15 2024)    Filed Vitals:   04/01/15 1857 04/01/15 1930 04/01/15 2000 04/01/15 2048  BP:  127/76 116/73 124/83  Pulse:  115 112 119  Temp: 99.8 F (37.7 C)   99.6 F (37.6 C)  TempSrc: Rectal   Oral  Resp:   19 18  Height:    6\' 2"  (1.88 m)  Weight:    212 lb 12.8 oz (96.525 kg)   SpO2:  96% 100% 100%    Final diagnoses:  Lower abdominal pain  Dehydration  Acute renal failure, unspecified acute renal failure type  Sinus tachycardia    Admission/ observation were discussed with the admitting physician, patient and/or family and they are comfortable with the plan.     Elnora Morrison, MD 04/02/15 [phone]

## 2015-04-01 NOTE — ED Notes (Signed)
The pt is c/o nausea no appetite abd pain and distention sob and weakness hx dementia

## 2015-04-01 NOTE — H&P (Signed)
[facility] History and Physical  Marvin Bishop ZDG:[id number] DOB: 28-Mar-1920 DOA: 04/01/2015  Referring physician: EDP PCP: [name], MD   Chief Complaint: Weakness   HPI: Marvin Barns Sr. is a 81 y.o. male with h/o DM, dementia.  Patient presents to ED with c/o lower abdominal discomfort, generalized weakness, nausea.  Symptoms ongoing at home for past few days, no sick contacts.  Poor PO intake, no vomiting nor blood in stools.  Lives with daughter at baseline due to dementia.  Is having BMs.  Review of Systems: Systems reviewed.  As above, otherwise negative  Past Medical History  Diagnosis Date  . Arthritis   . Diabetes mellitus   . Hypertension   . Hypercholesteremia   . Insomnia     occasional  . Nephrolithiasis     right UVJ stone  . Dementia   . Prostate cancer   . Memory loss   . Headache(784.0)     slight  . Asthma     hx of as child  . PONV (postoperative nausea and vomiting)   . Complete loss of vision     right eye  . Peripheral neuropathy     both legs   Past Surgical History  Procedure Laterality Date  . Appendectomy    . Pars plana vitrectomy  10/24/2011    Procedure: PARS PLANA VITRECTOMY WITH 23 GAUGE;  Surgeon: Adonis Brook, MD;  Location: [facility];  Service: Ophthalmology;  Laterality: Right;  . Tonsillectomy  as teen  . Knee arthroscopy Right   . Hemorroidectomy    . Cystoscopy w/ ureteral stent placement Right 10/01/2013    Procedure: cystoscopy right retrograde holmium laser lithotripsy;  Surgeon: Franchot Gallo, MD;  Location: [facility];  Service: Urology;  Laterality: Right;  holmium laser application  . Cornea removed Right 02-26-15   Social History:  reports that he quit smoking about 48 years ago. His smoking use included Cigarettes. He smoked 0.25 packs per day. He has never used smokeless tobacco. He reports that he drinks alcohol. He reports that he does not use illicit drugs.  Allergies  Allergen Reactions  . Donepezil      Medication listed but no reaction stated; family not sure of reaction  . Hydrocodone Nausea And Vomiting    Family History  Problem Relation Age of Onset  . Cancer Mother     uterus  . Heart Problems Father   . Cancer Brother     stomach  . Cancer Brother     throat  . Colon cancer Brother      Prior to Admission medications   Medication Sig Start Date End Date Taking? Authorizing Provider  aspirin EC 81 MG tablet Take 81 mg by mouth daily.   Yes Historical Provider, MD  docusate sodium 100 MG CAPS Take 200 mg by mouth 2 (two) times daily as needed for mild constipation. 08/25/14  Yes Thurnell Lose, MD  esomeprazole (NEXIUM) 40 MG capsule Take 40 mg by mouth daily at 12 noon.   Yes Historical Provider, MD  furosemide (LASIX) 20 MG tablet Take 20 mg by mouth See admin instructions. Take on Mondays Wednesday and Friday   Yes Historical Provider, MD  Ginkgo Biloba (GINKGO PO) Take 2 capsules by mouth 2 (two) times daily. Gingko Smart Vitamin softgels   Yes Historical Provider, MD  glipiZIDE (GLUCOTROL) 10 MG tablet Take 10 mg by mouth 2 (two) times daily before a meal.    Yes Historical Provider, MD  HYDROcodone-acetaminophen (NORCO/VICODIN) 5-325 MG per tablet Take 1 tablet by mouth every 6 (six) hours as needed for moderate pain.   Yes Historical Provider, MD  LEVEMIR FLEXTOUCH 100 UNIT/ML Pen Inject 15 Units into the skin daily at 10 pm.  02/09/14  Yes Historical Provider, MD  linagliptin (TRADJENTA) 5 MG TABS tablet Take 5 mg by mouth daily.   Yes Historical Provider, MD  lisinopril (PRINIVIL,ZESTRIL) 2.5 MG tablet Take 2.5 mg by mouth every morning.    Yes Historical Provider, MD  memantine (NAMENDA) 10 MG tablet Take 1 tablet (10 mg total) by mouth 2 (two) times daily. 03/17/15  Yes Dennie Bible, NP  metFORMIN (GLUCOPHAGE) 500 MG tablet Take 500 mg by mouth 2 (two) times daily with a meal.  03/16/15  Yes Historical Provider, MD  Multiple Vitamin (MULTIVITAMIN WITH MINERALS)  TABS tablet Take 1 tablet by mouth daily.   Yes Historical Provider, MD  ondansetron (ZOFRAN) 4 MG tablet Take 4 mg by mouth every 8 (eight) hours as needed for nausea or vomiting.  03/08/15  Yes Historical Provider, MD  ranitidine (ZANTAC) 75 MG tablet Take 75 mg by mouth 2 (two) times daily.   Yes Historical Provider, MD  simvastatin (ZOCOR) 40 MG tablet Take 40 mg by mouth every morning.    Yes Historical Provider, MD  meloxicam (MOBIC) 7.5 MG tablet Take 7.5 mg by mouth daily as needed for pain.    Historical Provider, MD   Physical Exam: Filed Vitals:   04/01/15 2000  BP: 116/73  Pulse: 112  Temp:   Resp: 19    BP 116/73 mmHg  Pulse 112  Temp(Src) 99.8 F (37.7 C) (Rectal)  Resp 19  SpO2 100%  General Appearance:    Alert, oriented, no distress, appears stated age  Head:    Normocephalic, atraumatic  Eyes:    PERRL, EOMI, sclera non-icteric        Nose:   Nares without drainage or epistaxis. Mucosa, turbinates normal  Throat:   Moist mucous membranes. Oropharynx without erythema or exudate.  Neck:   Supple. No carotid bruits.  No thyromegaly.  No lymphadenopathy.   Back:     No CVA tenderness, no spinal tenderness  Lungs:     Clear to auscultation bilaterally, without wheezes, rhonchi or rales  Chest wall:    No tenderness to palpitation  Heart:    Tachycardic, regular, w/o murmurs, gallops, rubs  Abdomen:     Soft, non-tender, nondistended, normal bowel sounds, no organomegaly  Genitalia:    deferred  Rectal:    deferred  Extremities:   No clubbing, cyanosis or edema.  Pulses:   2+ and symmetric all extremities  Skin:   Skin color, texture, turgor normal, no rashes or lesions  Lymph nodes:   Cervical, supraclavicular, and axillary nodes normal  Neurologic:   CNII-XII intact. Normal strength, sensation and reflexes      throughout    Labs on Admission:  Basic Metabolic Panel:  Recent Labs Lab 04/01/15 1530  NA 133*  K 4.7  CL 100*  CO2 26  GLUCOSE 222*  BUN  20  CREATININE 1.52*  CALCIUM 9.5   Liver Function Tests:  Recent Labs Lab 04/01/15 1530  AST 28  ALT 17  ALKPHOS 79  BILITOT 1.1  PROT 7.6  ALBUMIN 3.5    Recent Labs Lab 04/01/15 1530  LIPASE 33   No results for input(s): AMMONIA in the last 168 hours. CBC:  Recent Labs Lab 04/01/15  1530  WBC 10.7*  NEUTROABS 7.8*  HGB 14.0  HCT 41.5  MCV 82.2  PLT 199   Cardiac Enzymes:  Recent Labs Lab 04/01/15 1530  TROPONINI 0.03    BNP (last 3 results) No results for input(s): PROBNP in the last 8760 hours. CBG: No results for input(s): GLUCAP in the last 168 hours.  Radiological Exams on Admission: Dg Chest Portable 1 View  04/01/2015   CLINICAL DATA:  Shortness of breath, weakness  EXAM: PORTABLE CHEST - 1 VIEW  COMPARISON:  08/23/2014  FINDINGS: Cardiac leads obscure detail. Right AC joint degenerative change. Heart size is mildly enlarged without evidence for edema. No focal pulmonary opacity. No pleural effusion.  IMPRESSION: Mild cardiomegaly without focal acute finding.   Electronically Signed   By: Conchita Paris M.D.   On: 04/01/2015 18:39   Ct Renal Stone Study  04/01/2015   CLINICAL DATA:  81 year old male with nausea abdominal pain, abdominal distention, shortness of breath, weakness. Initial encounter.  EXAM: CT ABDOMEN AND PELVIS WITHOUT CONTRAST  TECHNIQUE: Multidetector CT imaging of the abdomen and pelvis was performed following the standard protocol without IV contrast.  COMPARISON:  CT Abdomen and Pelvis 08/23/2014.  FINDINGS: Mild motion artifact at the lung bases. Stable cardiomegaly. No pericardial effusion. Stable scarring or atelectasis in the right costophrenic angle.  Advanced degenerative changes in the spine with bulky osteophytosis. Multilevel lumbar spinal stenosis. No acute osseous abnormality identified.  Sequelae of prostatectomy. No pelvic free fluid. Diminutive bladder. Negative rectum. Decompressed small bowel loops in the pelvis.   Diverticulosis of the sigmoid colon with no active inflammation identified. Negative left colon aside from retained stool. Redundant transverse colon with retained stool. Negative right colon. Normal appendix. Negative terminal ileum. No dilated small bowel small hiatal hernia. Decompressed stomach. Negative duodenum.  Noncontrast liver with chronic left lateral lobe hemangioma which is better demonstrated on the prior contrast-enhanced study. Negative noncontrast CT appearance of the gallbladder. Noncontrast spleen, pancreas and adrenal glands are within normal limits. No hydronephrosis. Chronic left renal cysts. No nephrolithiasis. No hydroureter or ureteral calculus. No abdominal free fluid. Aortoiliac calcified atherosclerosis noted. No lymphadenopathy identified. Small fat containing inguinal hernias are stable.  IMPRESSION: 1. No urologic calculi. No acute or inflammatory process identified in the abdomen or pelvis. 2. Chronic findings including sigmoid diverticulosis, Aortoiliac calcified atherosclerosis, Advanced spine degeneration with lumbar spinal stenosis.   Electronically Signed   By: Genevie Ann M.D.   On: 04/01/2015 18:56    EKG: Independently reviewed.  Assessment/Plan Principal Problem:   Dehydration Active Problems:   Hypertension   Diabetes mellitus   Dementia without behavioral disturbance   Sinus tachycardia   1. Dehydration - also has tachycardia, borderline temp, borderline WBC, watch for development of SIRS 1. IVF 2. Hold lasix 3. Monitor kidney function, (slightly up from baseline but not enough to call AKI). 2. S.Tach - due to dehydration 1. Tele monitor 3. HTN - continue home meds 4. DM2 - hold metformin, continue lantus and other meds, adding low dose SSI AC/HS 5. Dementia - continue home meds    Code Status: Full Code  Family Communication: No family in room Disposition Plan: Admit to obs   Time spent: 70 min  GARDNER, JARED M. [facility] Pager  [phone]  If 7AM-7PM, please contact the day team taking care of the patient [email or web address] Password [facility] 04/01/2015, 8:05 PM

## 2015-04-01 NOTE — ED Notes (Signed)
MD at bedside. 

## 2015-04-02 DIAGNOSIS — F0390 Unspecified dementia without behavioral disturbance: Secondary | ICD-10-CM

## 2015-04-02 DIAGNOSIS — E860 Dehydration: Secondary | ICD-10-CM | POA: Diagnosis not present

## 2015-04-02 DIAGNOSIS — I471 Supraventricular tachycardia: Secondary | ICD-10-CM | POA: Diagnosis not present

## 2015-04-02 DIAGNOSIS — I10 Essential (primary) hypertension: Secondary | ICD-10-CM

## 2015-04-02 DIAGNOSIS — E118 Type 2 diabetes mellitus with unspecified complications: Secondary | ICD-10-CM

## 2015-04-02 LAB — BASIC METABOLIC PANEL
ANION GAP: 8 (ref 5–15)
BUN: 17 mg/dL (ref 6–20)
CHLORIDE: 104 mmol/L (ref 101–111)
CO2: 26 mmol/L (ref 22–32)
Calcium: 8.8 mg/dL — ABNORMAL LOW (ref 8.9–10.3)
Creatinine, Ser: 1.38 mg/dL — ABNORMAL HIGH (ref 0.61–1.24)
GFR, EST AFRICAN AMERICAN: 50 mL/min — AB (ref >60)
GFR, EST NON AFRICAN AMERICAN: 43 mL/min — AB (ref >60)
Glucose, Bld: 67 mg/dL (ref 65–99)
POTASSIUM: 3.8 mmol/L (ref 3.5–5.1)
Sodium: 138 mmol/L (ref 135–145)

## 2015-04-02 LAB — CBC
HCT: 35 % — ABNORMAL LOW (ref 39.0–52.0)
Hemoglobin: 11.8 g/dL — ABNORMAL LOW (ref 13.0–17.0)
MCH: 27.4 pg (ref 26.0–34.0)
MCHC: 33.7 g/dL (ref 30.0–36.0)
MCV: 81.2 fL (ref 78.0–100.0)
PLATELETS: 167 10*3/uL (ref 150–400)
RBC: 4.31 MIL/uL (ref 4.22–5.81)
RDW: 14.3 % (ref 11.5–15.5)
WBC: 9.1 10*3/uL (ref 4.0–10.5)

## 2015-04-02 LAB — GLUCOSE, CAPILLARY
GLUCOSE-CAPILLARY: 74 mg/dL (ref 65–99)
Glucose-Capillary: 166 mg/dL — ABNORMAL HIGH (ref 65–99)
Glucose-Capillary: 222 mg/dL — ABNORMAL HIGH (ref 65–99)
Glucose-Capillary: 171 mg/dL — ABNORMAL HIGH (ref 65–99)

## 2015-04-02 NOTE — Progress Notes (Signed)
[facility] PROGRESS NOTE  Marvin Bishop FAO:[id number] DOB: Oct 17, 1919 DOA: 04/01/2015 PCP: [name], MD  Assessment/Plan: 1-generalized weakness: appears to be secondary to dehydration -neg troponin -feeling better after IVF's given -no signs of infection -will monitor -PT/OT ordered -patient encourage to increase PO intake -continue holding diuretics  2-acute on chronic renal failure: stage 3 moderate at baseline -improved with IVF's  and back to his baseline -will monitor  3-essential HTN: continue home medication regimen -will check orthostatic VS in am  4-dementia: w/o behavioral disturbances -will continue supportive care -continue namenda  5-Diabetes type 2 with nephropathy -will hold oral hypoglycemic regimen -continue SSI  6-GERD: will continue PPI  7-HLD: continue statins   Code Status: Full Family Communication: no family at bedside Disposition Plan: to be determine; PT/OT has been ordered    Consultants:  None   Procedures:  See below for x-ray reports   Antibiotics:  None   HPI/Subjective: Feeling better, no CP, no nausea, no vomiting.    Objective: Filed Vitals:   04/02/15 1510  BP: 116/59  Pulse: 104  Temp: 98.8 F (37.1 C)  Resp: 18    Intake/Output Summary (Last 24 hours) at 04/02/15 1614 Last data filed at 04/02/15 1404  Gross per 24 hour  Intake 2566.66 ml  Output   1325 ml  Net 1241.66 ml   Filed Weights   04/01/15 2048  Weight: 96.525 kg (212 lb 12.8 oz)    Exam:   General:  Afebrile, AAOX2, no distress  Cardiovascular: S1 and s2, no rubs or gallops; positive mild tachycardia (109)  Respiratory: CTA bilaterally  Abdomen: soft, NT, ND, positive BS  Musculoskeletal: no edema, no cyanosis  Data Reviewed: Basic Metabolic Panel:  Recent Labs Lab 04/01/15 1530 04/02/15 0500  NA 133* 138  K 4.7 3.8  CL 100* 104  CO2 26 26  GLUCOSE 222* 67  BUN 20 17  CREATININE 1.52* 1.38*  CALCIUM 9.5  8.8*   Liver Function Tests:  Recent Labs Lab 04/01/15 1530  AST 28  ALT 17  ALKPHOS 79  BILITOT 1.1  PROT 7.6  ALBUMIN 3.5    Recent Labs Lab 04/01/15 1530  LIPASE 33   CBC:  Recent Labs Lab 04/01/15 1530 04/02/15 0500  WBC 10.7* 9.1  NEUTROABS 7.8*  --   HGB 14.0 11.8*  HCT 41.5 35.0*  MCV 82.2 81.2  PLT 199 167   Cardiac Enzymes:  Recent Labs Lab 04/01/15 1530  TROPONINI 0.03   CBG:  Recent Labs Lab 04/01/15 2052 04/02/15 0809 04/02/15 1200  GLUCAP 133* 74 166*    Studies: Dg Chest Portable 1 View  04/01/2015   CLINICAL DATA:  Shortness of breath, weakness  EXAM: PORTABLE CHEST - 1 VIEW  COMPARISON:  08/23/2014  FINDINGS: Cardiac leads obscure detail. Right AC joint degenerative change. Heart size is mildly enlarged without evidence for edema. No focal pulmonary opacity. No pleural effusion.  IMPRESSION: Mild cardiomegaly without focal acute finding.   Electronically Signed   By: Conchita Paris M.D.   On: 04/01/2015 18:39   Ct Renal Stone Study  04/01/2015   CLINICAL DATA:  81 year old male with nausea abdominal pain, abdominal distention, shortness of breath, weakness. Initial encounter.  EXAM: CT ABDOMEN AND PELVIS WITHOUT CONTRAST  TECHNIQUE: Multidetector CT imaging of the abdomen and pelvis was performed following the standard protocol without IV contrast.  COMPARISON:  CT Abdomen and Pelvis 08/23/2014.  FINDINGS: Mild motion artifact at the lung bases. Stable cardiomegaly. No  pericardial effusion. Stable scarring or atelectasis in the right costophrenic angle.  Advanced degenerative changes in the spine with bulky osteophytosis. Multilevel lumbar spinal stenosis. No acute osseous abnormality identified.  Sequelae of prostatectomy. No pelvic free fluid. Diminutive bladder. Negative rectum. Decompressed small bowel loops in the pelvis.  Diverticulosis of the sigmoid colon with no active inflammation identified. Negative left colon aside from retained  stool. Redundant transverse colon with retained stool. Negative right colon. Normal appendix. Negative terminal ileum. No dilated small bowel small hiatal hernia. Decompressed stomach. Negative duodenum.  Noncontrast liver with chronic left lateral lobe hemangioma which is better demonstrated on the prior contrast-enhanced study. Negative noncontrast CT appearance of the gallbladder. Noncontrast spleen, pancreas and adrenal glands are within normal limits. No hydronephrosis. Chronic left renal cysts. No nephrolithiasis. No hydroureter or ureteral calculus. No abdominal free fluid. Aortoiliac calcified atherosclerosis noted. No lymphadenopathy identified. Small fat containing inguinal hernias are stable.  IMPRESSION: 1. No urologic calculi. No acute or inflammatory process identified in the abdomen or pelvis. 2. Chronic findings including sigmoid diverticulosis, Aortoiliac calcified atherosclerosis, Advanced spine degeneration with lumbar spinal stenosis.   Electronically Signed   By: Genevie Ann M.D.   On: 04/01/2015 18:56    Scheduled Meds: . aspirin EC  81 mg Oral Daily  . glipiZIDE  10 mg Oral BID AC  . heparin  5,000 Units Subcutaneous 3 times per day  . insulin aspart  0-9 Units Subcutaneous TID WC  . insulin detemir  15 Units Subcutaneous Q2200  . linagliptin  5 mg Oral Daily  . lisinopril  2.5 mg Oral q morning - 10a  . memantine  10 mg Oral BID  . pantoprazole  80 mg Oral Q1200  . simvastatin  40 mg Oral q morning - 10a   Continuous Infusions: . sodium chloride 75 mL/hr at 04/02/15 1404    Principal Problem:   Dehydration Active Problems:   Hypertension   Diabetes mellitus   Dementia without behavioral disturbance   Sinus tachycardia    Time spent: 35 minutes    Barton Dubois  [facility] Pager [phone]. If 7PM-7AM, please contact night-coverage at [email or web address], password [facility] 04/02/2015, 4:14 PM

## 2015-04-02 NOTE — Progress Notes (Signed)
Nutrition Brief Note Patient identified on the Malnutrition Screening Tool (MST) Report  Wt Readings from Last 15 Encounters:  04/01/15 212 lb 12.8 oz (96.525 kg)  03/17/15 219 lb 6.4 oz (99.519 kg)  03/08/15 209 lb (94.802 kg)  09/14/14 209 lb (94.802 kg)  08/23/14 208 lb 12.8 oz (94.711 kg)  03/04/14 219 lb (99.338 kg)  09/29/13 217 lb (98.431 kg)  09/02/13 218 lb (98.884 kg)  08/22/13 221 lb 1.6 oz (100.29 kg)  08/04/13 219 lb (99.338 kg)  04/23/13 215 lb (97.523 kg)  10/23/11 228 lb (103.42 kg)   Body mass index is 27.31 kg/(m^2). Patient meets criteria for Overweight based on current BMI.   Pt seemed surprised when I said someone had reported he wasn't eating well and had lost weight (7 lbs in 2 weeks?). He is actually up a few pounds since the beginning of the year.   He thinks he is doing fine. He reports a good appetite. He does occasionally have nausea, but that isnt new. Denies c/d/v. He was interested in snacks.  Current diet order is Heart Healthy/Carb Modified, patient is consuming approximately 85% of meals at this time. Labs and medications reviewed.   No nutrition interventions warranted at this time. If nutrition issues arise, please consult RD.   Burtis Junes RD, LDN Nutrition Pager: [phone] 04/02/2015 5:04 PM

## 2015-04-03 DIAGNOSIS — I471 Supraventricular tachycardia: Secondary | ICD-10-CM | POA: Diagnosis not present

## 2015-04-03 DIAGNOSIS — E118 Type 2 diabetes mellitus with unspecified complications: Secondary | ICD-10-CM | POA: Diagnosis not present

## 2015-04-03 DIAGNOSIS — I10 Essential (primary) hypertension: Secondary | ICD-10-CM | POA: Diagnosis not present

## 2015-04-03 DIAGNOSIS — N179 Acute kidney failure, unspecified: Secondary | ICD-10-CM

## 2015-04-03 DIAGNOSIS — N183 Chronic kidney disease, stage 3 (moderate): Secondary | ICD-10-CM

## 2015-04-03 DIAGNOSIS — E1121 Type 2 diabetes mellitus with diabetic nephropathy: Secondary | ICD-10-CM

## 2015-04-03 DIAGNOSIS — E860 Dehydration: Secondary | ICD-10-CM | POA: Diagnosis not present

## 2015-04-03 DIAGNOSIS — F0390 Unspecified dementia without behavioral disturbance: Secondary | ICD-10-CM | POA: Diagnosis not present

## 2015-04-03 LAB — GLUCOSE, CAPILLARY
GLUCOSE-CAPILLARY: 90 mg/dL (ref 65–99)
GLUCOSE-CAPILLARY: 92 mg/dL (ref 65–99)
Glucose-Capillary: 90 mg/dL (ref 65–99)
Glucose-Capillary: 264 mg/dL — ABNORMAL HIGH (ref 65–99)
Glucose-Capillary: 95 mg/dL (ref 65–99)

## 2015-04-03 LAB — BASIC METABOLIC PANEL
Anion gap: 4 — ABNORMAL LOW (ref 5–15)
BUN: 15 mg/dL (ref 6–20)
CHLORIDE: 107 mmol/L (ref 101–111)
CO2: 26 mmol/L (ref 22–32)
Calcium: 8.4 mg/dL — ABNORMAL LOW (ref 8.9–10.3)
Creatinine, Ser: 1.26 mg/dL — ABNORMAL HIGH (ref 0.61–1.24)
GFR calc Af Amer: 55 mL/min — ABNORMAL LOW (ref >60)
GFR calc non Af Amer: 48 mL/min — ABNORMAL LOW (ref >60)
Glucose, Bld: 115 mg/dL — ABNORMAL HIGH (ref 65–99)
POTASSIUM: 4 mmol/L (ref 3.5–5.1)
Sodium: 137 mmol/L (ref 135–145)

## 2015-04-03 MED ORDER — FAMOTIDINE 20 MG PO TABS
20.0000 mg | ORAL_TABLET | Freq: Every day | ORAL | Status: DC
  Administered 2015-04-03: 20 mg via ORAL
  Filled 2015-04-03 (×2): qty 1

## 2015-04-03 NOTE — Evaluation (Signed)
Physical Therapy Evaluation Patient Details Name: Marvin SHELDON Sr. MRN: [id number] DOB: [birth year] Today's Date: 04/03/2015   History of Present Illness  HPI: Marvin Bishop. is a 81 y.o. male with h/o DM, dementia. Patient presents to ED with c/o lower abdominal discomfort, generalized weakness, nausea. Symptoms ongoing at home for past few days, no sick contacts. Poor PO intake, no vomiting nor blood in stools. Lives with daughter at baseline due to dementia.  Past Medical History  Diagnosis Date  . Arthritis   . Diabetes mellitus   . Hypertension   . Hypercholesteremia   . Insomnia     occasional  . Nephrolithiasis     right UVJ stone  . Dementia   . Prostate cancer   . Memory loss   . Headache(784.0)     slight  . Asthma     hx of as child  . PONV (postoperative nausea and vomiting)   . Complete loss of vision     right eye  . Peripheral neuropathy     both legs     Clinical Impression   Pt admitted with above diagnosis. Pt currently with functional limitations due to the deficits listed below (see PT Problem List).  Pt will benefit from skilled PT to increase their independence and safety with mobility to allow discharge to the venue listed below.    For pts with dementia, I favor dc home to familiar environment, routine, and caregivers; still, [name] expereince quite a lot of fatigue when his HR became tachy during amb (noted 147 observed highest), and at this current functional mobility status, I am concerned for his safety at home -- still, he is observation status, and I'm not sure that he has a qualifying stay to consider SNF.  It seems [name] and his family would benefit from considering Adult Day Care options.    Follow Up Recommendations Other (comment) (Likely HHPT; see above)    Equipment Recommendations  3in1 (PT)    Recommendations for Other Services Other (comment) (Case Management to introduce options for adult day care)      Precautions / Restrictions Precautions Precautions: Fall      Mobility  Bed Mobility Overal bed mobility: Modified Independent             General bed mobility comments: Increased time and cues to complete task  Transfers Overall transfer level: Needs assistance Equipment used: 1 person hand held assist Transfers: Sit to/from Stand Sit to Stand: Min assist         General transfer comment: min assist to steady; noted tends to brace backs of LEs against bed  Ambulation/Gait Ambulation/Gait assistance: Min assist;Mod assist Ambulation Distance (Feet): 100 Feet Assistive device: Rolling walker (2 wheeled);1 person hand held assist Gait Pattern/deviations: Decreased stride length;Trunk flexed     General Gait Details: Tends to reach out for UE support when walking without UE support; much imporved with use of RW for bil UE support, but HR became tachy during walk and pt had to rest elbows on RW to rest, then needed light mod assist and close guard as we headed back to room  Stairs            Wheelchair Mobility    Modified Rankin (Stroke Patients Only)       Balance Overall balance assessment: Needs assistance           Standing balance-Leahy Scale: Poor (approaching fair)  Pertinent Vitals/Pain Pain Assessment: No/denies pain    Home Living Family/patient expects to be discharged to:: Private residence Living Arrangements: Children (lives with daughter per pt) Available Help at Discharge: Family;Available PRN/intermittently Type of Home: House Home Access: Stairs to enter Entrance Stairs-Rails: Right;Left;Can reach both Entrance Stairs-Number of Steps: 4 Home Layout: One level Home Equipment: Cane - single point;Walker - 2 wheels      Prior Function Level of Independence: Needs assistance   Gait / Transfers Assistance Needed: Reports no porblems; as of 7 months ago (per PT note from that admission),  he did not use a RW; he reports he has one now  ADL's / Homemaking Assistance Needed: ind BADLs; does not drive; daughter shops for him; friend does yardwork (per PT note 08/24/14)        Hand Dominance   Dominant Hand: Left    Extremity/Trunk Assessment   Upper Extremity Assessment: Defer to OT evaluation           Lower Extremity Assessment: Generalized weakness      Cervical / Trunk Assessment: Normal  Communication   Communication: No difficulties  Cognition Arousal/Alertness: Awake/alert Behavior During Therapy: WFL for tasks assessed/performed Overall Cognitive Status: No family/caregiver present to determine baseline cognitive functioning       Memory: Decreased short-term memory (mild dementia per MD)              General Comments      Exercises        Assessment/Plan    PT Assessment Patient needs continued PT services  PT Diagnosis Difficulty walking;Generalized weakness   PT Problem List Decreased strength;Decreased activity tolerance;Decreased balance;Decreased mobility;Decreased coordination;Decreased cognition;Decreased knowledge of use of DME;Decreased safety awareness;Cardiopulmonary status limiting activity  PT Treatment Interventions DME instruction;Gait training;Stair training;Functional mobility training;Therapeutic activities;Therapeutic exercise;Balance training;Patient/family education   PT Goals (Current goals can be found in the Care Plan section) Acute Rehab PT Goals Patient Stated Goal: agreeable to amb PT Goal Formulation: With patient Time For Goal Achievement: 04/17/15 Potential to Achieve Goals: Good    Frequency Min 3X/week   Barriers to discharge Decreased caregiver support Is home alone large portions of the day    Co-evaluation               End of Session Equipment Utilized During Treatment: Gait belt Activity Tolerance: Patient limited by fatigue Patient left: in chair;with call bell/phone within  reach;with chair alarm set Nurse Communication: Mobility status    Functional Assessment Tool Used: Clinical Judgement Functional Limitation: Mobility: Walking and moving around Mobility: Walking and Moving Around Current Status [phone]): At least 20 percent but less than 40 percent impaired, limited or restricted Mobility: Walking and Moving Around Goal Status [phone]): 0 percent impaired, limited or restricted    Time: 1203-1234 PT Time Calculation (min) (ACUTE ONLY): 31 min   Charges:   PT Evaluation $Initial PT Evaluation Tier I: 1 Procedure PT Treatments $Gait Training: 8-22 mins   PT G Codes:   PT G-Codes **NOT FOR INPATIENT CLASS** Functional Assessment Tool Used: Clinical Judgement Functional Limitation: Mobility: Walking and moving around Mobility: Walking and Moving Around Current Status ([id number]): At least 20 percent but less than 40 percent impaired, limited or restricted Mobility: Walking and Moving Around Goal Status [phone]): 0 percent impaired, limited or restricted    Roney Marion Hamff 04/03/2015, 1:56 PM  Roney Marion, [place] Pager [phone] Office [phone]

## 2015-04-03 NOTE — Progress Notes (Signed)
[facility] PROGRESS NOTE  Marvin Bishop HCW:[id number] DOB: [birth year] DOA: 04/01/2015 PCP: [name], MD  Assessment/Plan: 1-generalized weakness: appears to be secondary to dehydration -neg troponin -feeling better overall, after IVF's  -no signs of infection, normal WBC's and no fever -will monitor and follow clinical resposne -PT/OT ordered, will follow rec's -patient encourage to increase PO intake -continue holding diuretics and lisinopril today  2-acute on chronic renal failure: stage 3 moderate at baseline -continue improving with IVF's; and essentially back to his baseline -will monitor trend  3-essential HTN:  -BP soft and positive orthostatic changes apprecuiated -will hold lisinopril -continue IVF's  4-dementia: w/o behavioral disturbances -will continue supportive care -continue namenda -patient pretty much at baseline  5-Diabetes type 2 with nephropathy -will hold oral hypoglycemic regimen -continue SSI  6-GERD: will continue PPI  7-HLD: will continue statins   Code Status: Full Family Communication: no family at bedside Disposition Plan: to be determine; PT/OT has been ordered    Consultants:  None   Procedures:  See below for x-ray reports   Antibiotics:  None   HPI/Subjective: Feeling better, no fever and denying CP, SOB, nausea and vomiting.    Objective: Filed Vitals:   04/03/15 1400  BP: 102/56  Pulse: 114  Temp:   Resp: 20    Intake/Output Summary (Last 24 hours) at 04/03/15 1552 Last data filed at 04/03/15 1350  Gross per 24 hour  Intake 1966.25 ml  Output   1030 ml  Net 936.25 ml   Filed Weights   04/01/15 2048  Weight: 96.525 kg (212 lb 12.8 oz)    Exam:   General:  Afebrile, AAOX2, no distress, no CP. Patient still weak (even feeling better) and with development of tachycardia and soft BP when working with PT (positive orthostatic changes)  Cardiovascular: S1 and s2, no rubs or gallops; positive  mild tachycardia (109)  Respiratory: CTA bilaterally  Abdomen: soft, NT, ND, positive BS  Musculoskeletal: no edema, no cyanosis  Data Reviewed: Basic Metabolic Panel:  Recent Labs Lab 04/01/15 1530 04/02/15 0500 04/03/15 0305  NA 133* 138 137  K 4.7 3.8 4.0  CL 100* 104 107  CO2 26 26 26   GLUCOSE 222* 67 115*  BUN 20 17 15   CREATININE 1.52* 1.38* 1.26*  CALCIUM 9.5 8.8* 8.4*   Liver Function Tests:  Recent Labs Lab 04/01/15 1530  AST 28  ALT 17  ALKPHOS 79  BILITOT 1.1  PROT 7.6  ALBUMIN 3.5    Recent Labs Lab 04/01/15 1530  LIPASE 33   CBC:  Recent Labs Lab 04/01/15 1530 04/02/15 0500  WBC 10.7* 9.1  NEUTROABS 7.8*  --   HGB 14.0 11.8*  HCT 41.5 35.0*  MCV 82.2 81.2  PLT 199 167   Cardiac Enzymes:  Recent Labs Lab 04/01/15 1530  TROPONINI 0.03   CBG:  Recent Labs Lab 04/02/15 1200 04/02/15 1654 04/02/15 2113 04/03/15 0756 04/03/15 1144  GLUCAP 166* 222* 171* 90 90    Studies: Dg Chest Portable 1 View  04/01/2015   CLINICAL DATA:  Shortness of breath, weakness  EXAM: PORTABLE CHEST - 1 VIEW  COMPARISON:  08/23/2014  FINDINGS: Cardiac leads obscure detail. Right AC joint degenerative change. Heart size is mildly enlarged without evidence for edema. No focal pulmonary opacity. No pleural effusion.  IMPRESSION: Mild cardiomegaly without focal acute finding.   Electronically Signed   By: Conchita Paris M.D.   On: 04/01/2015 18:39   Ct Renal Stone Study  04/01/2015  CLINICAL DATA:  81 year old male with nausea abdominal pain, abdominal distention, shortness of breath, weakness. Initial encounter.  EXAM: CT ABDOMEN AND PELVIS WITHOUT CONTRAST  TECHNIQUE: Multidetector CT imaging of the abdomen and pelvis was performed following the standard protocol without IV contrast.  COMPARISON:  CT Abdomen and Pelvis 08/23/2014.  FINDINGS: Mild motion artifact at the lung bases. Stable cardiomegaly. No pericardial effusion. Stable scarring or  atelectasis in the right costophrenic angle.  Advanced degenerative changes in the spine with bulky osteophytosis. Multilevel lumbar spinal stenosis. No acute osseous abnormality identified.  Sequelae of prostatectomy. No pelvic free fluid. Diminutive bladder. Negative rectum. Decompressed small bowel loops in the pelvis.  Diverticulosis of the sigmoid colon with no active inflammation identified. Negative left colon aside from retained stool. Redundant transverse colon with retained stool. Negative right colon. Normal appendix. Negative terminal ileum. No dilated small bowel small hiatal hernia. Decompressed stomach. Negative duodenum.  Noncontrast liver with chronic left lateral lobe hemangioma which is better demonstrated on the prior contrast-enhanced study. Negative noncontrast CT appearance of the gallbladder. Noncontrast spleen, pancreas and adrenal glands are within normal limits. No hydronephrosis. Chronic left renal cysts. No nephrolithiasis. No hydroureter or ureteral calculus. No abdominal free fluid. Aortoiliac calcified atherosclerosis noted. No lymphadenopathy identified. Small fat containing inguinal hernias are stable.  IMPRESSION: 1. No urologic calculi. No acute or inflammatory process identified in the abdomen or pelvis. 2. Chronic findings including sigmoid diverticulosis, Aortoiliac calcified atherosclerosis, Advanced spine degeneration with lumbar spinal stenosis.   Electronically Signed   By: Genevie Ann M.D.   On: 04/01/2015 18:56    Scheduled Meds: . aspirin EC  81 mg Oral Daily  . glipiZIDE  10 mg Oral BID AC  . heparin  5,000 Units Subcutaneous 3 times per day  . insulin aspart  0-9 Units Subcutaneous TID WC  . insulin detemir  15 Units Subcutaneous Q2200  . linagliptin  5 mg Oral Daily  . memantine  10 mg Oral BID  . pantoprazole  80 mg Oral Q1200  . simvastatin  40 mg Oral q morning - 10a   Continuous Infusions: . sodium chloride 75 mL/hr at 04/02/15 1404    Principal  Problem:   Dehydration Active Problems:   Hypertension   Diabetes mellitus   Dementia without behavioral disturbance   Sinus tachycardia   Time spent: 35 minutes   Barton Dubois  [facility] Pager [phone]. If 7PM-7AM, please contact night-coverage at [email or web address], password [facility] 04/03/2015, 3:52 PM

## 2015-04-04 ENCOUNTER — Encounter (HOSPITAL_COMMUNITY): Payer: Self-pay

## 2015-04-04 DIAGNOSIS — I10 Essential (primary) hypertension: Secondary | ICD-10-CM | POA: Diagnosis not present

## 2015-04-04 DIAGNOSIS — E118 Type 2 diabetes mellitus with unspecified complications: Secondary | ICD-10-CM | POA: Diagnosis not present

## 2015-04-04 DIAGNOSIS — F0390 Unspecified dementia without behavioral disturbance: Secondary | ICD-10-CM | POA: Diagnosis not present

## 2015-04-04 DIAGNOSIS — K219 Gastro-esophageal reflux disease without esophagitis: Secondary | ICD-10-CM

## 2015-04-04 DIAGNOSIS — E860 Dehydration: Secondary | ICD-10-CM | POA: Diagnosis not present

## 2015-04-04 DIAGNOSIS — I471 Supraventricular tachycardia: Secondary | ICD-10-CM | POA: Diagnosis not present

## 2015-04-04 DIAGNOSIS — R5381 Other malaise: Secondary | ICD-10-CM

## 2015-04-04 LAB — BASIC METABOLIC PANEL
Anion gap: 7 (ref 5–15)
BUN: 16 mg/dL (ref 6–20)
CALCIUM: 8.2 mg/dL — AB (ref 8.9–10.3)
CO2: 23 mmol/L (ref 22–32)
CREATININE: 1.56 mg/dL — AB (ref 0.61–1.24)
Chloride: 108 mmol/L (ref 101–111)
GFR calc Af Amer: 43 mL/min — ABNORMAL LOW (ref >60)
GFR, EST NON AFRICAN AMERICAN: 37 mL/min — AB (ref >60)
GLUCOSE: 76 mg/dL (ref 65–99)
Potassium: 3.9 mmol/L (ref 3.5–5.1)
Sodium: 138 mmol/L (ref 135–145)

## 2015-04-04 LAB — GLUCOSE, CAPILLARY
GLUCOSE-CAPILLARY: 61 mg/dL — AB (ref 65–99)
GLUCOSE-CAPILLARY: 72 mg/dL (ref 65–99)
GLUCOSE-CAPILLARY: 99 mg/dL (ref 65–99)
Glucose-Capillary: 104 mg/dL — ABNORMAL HIGH (ref 65–99)

## 2015-04-04 MED ORDER — METOPROLOL TARTRATE 1 MG/ML IV SOLN
2.5000 mg | Freq: Once | INTRAVENOUS | Status: AC
  Administered 2015-04-04: 2.5 mg via INTRAVENOUS
  Filled 2015-04-04: qty 5

## 2015-04-04 MED ORDER — FAMOTIDINE 20 MG PO TABS: 20.0000 mg | ORAL_TABLET | Freq: Every day | ORAL | Status: AC

## 2015-04-04 MED ORDER — ESOMEPRAZOLE MAGNESIUM 40 MG PO CPDR: 40.0000 mg | DELAYED_RELEASE_CAPSULE | Freq: Two times a day (BID) | ORAL | Status: AC

## 2015-04-04 NOTE — Progress Notes (Signed)
Pt.escorted out by NT ,to be driven home by family.

## 2015-04-04 NOTE — Discharge Planning (Signed)
Pt IV and tele removed.  VSS and RN assessment reveal stability. Pt also had full bath before DC. Pt discharge instructions given to patient and daughter. Informed of needed FU appts and also given scripts. Pt educated on importance of staying hydrated, taking meds as prescribed and keep FU appts as needed.  HH was also set up and will be contacting patient. NA transporting to car and daughter driving patient home.

## 2015-04-04 NOTE — Discharge Summary (Signed)
Physician Discharge Summary  Marvin CHOQUETTE Sr. PFX:[id number] DOB: [birth year] DOA: 04/01/2015  PCP: [name], MD  Admit date: 04/01/2015 Discharge date: 04/04/2015  Time spent: >30 minutes  Recommendations for Outpatient Follow-up:  1. Repeat BMET to follow electrolytes and renal function 2. Repeat BP and adjust medications as needed 3. Follow CBG's closely and discotininue metformin if Creatinine remains above 1.4  Discharge Diagnoses:  Principal Problem:   Dehydration Active Problems:   Hypertension   Diabetes mellitus   Dementia without behavioral disturbance   Sinus tachycardia HLD Physical deconditioning  Acute on chronic renal failure stage 3   Discharge Condition: stable and improved. Discharge home with home health services and instruction to follow with PCP in 10 days.  Diet recommendation: low sodium heart healthy dier  Filed Weights   04/01/15 2048  Weight: 96.525 kg (212 lb 12.8 oz)    History of present illness:  81 y.o. male with h/o DM, dementia. Patient presents to ED with c/o lower abdominal discomfort, generalized weakness, nausea. Symptoms ongoing at home for past few days, no sick contacts. Poor PO intake, no vomiting nor blood in stools. Lives with daughter at baseline due to dementia. Is having BMs.  Hospital Course:  1-generalized weakness: appears to be secondary to dehydration -neg troponin -feeling much better overall, after IVF's resuscitation given -no signs of infection, normal WBC's and no fever -PT has seen patient and recommended home health services for physical conditioning (arranged at discharge)  -patient encourage to increase PO intake and maintain adequate hydration -lisinopril discontinued -lasix held and with intentions to be resumed on 6/21  2-acute on chronic renal failure: stage 3 moderate at baseline -improved with IVF's -will follow in outpatient setting during follow up visit -patient advise to keep himself well  hydrated  3-essential HTN:  -BP soft and positive orthostatic changes appreciated on admission  -will discontinue lisinopril -advise to maintain good hydration -advise to follow low sodium diet   4-dementia: w/o behavioral disturbances -continue namenda -patient mentation at baseline  5-Diabetes type 2 with nephropathy -will resume oral hypoglycemic regimen -CBG's well controlled -PCP to make further adjustments in near future (base on CBG's fluctuation and renal function stability, especially metformin)  6-GERD: will continue PPI, but will change to twice a day and use pepcid at bedtime   7-HLD: will continue statins   Procedures:  See below for x-ray reports   Consultations:  None   Discharge Exam: Filed Vitals:   04/04/15 0608  BP: 113/64  Pulse: 127  Temp: 99.1 F (37.3 C)  Resp: 22    General: Afebrile, AAOX2, no distress, no CP. Patient still slightly weak (but feeling a lot better) and with stable vital signs  Cardiovascular: S1 and s2, no rubs or gallops; mild tachycardia   Respiratory: CTA bilaterally  Abdomen: soft, NT, ND, positive BS  Musculoskeletal: trace to 1+ edema bilaterally, no cyanosis   Discharge Instructions   Discharge Instructions    Diet - low sodium heart healthy    Complete by:  As directed      Discharge instructions    Complete by:  As directed   Keep yourself well hydrated Take medications as prescribed Follow instructions from physical therapy regarding rehabilitation exercises/instructions maneuvers Follow heart healthy diet (watch the amount of sodium intake) Arrange follow up with PCP in 10 days (for hospital follow up)          Current Discharge Medication List    START taking these medications   Details  famotidine (PEPCID) 20 MG tablet Take 1 tablet (20 mg total) by mouth at bedtime. Qty: 30 tablet, Refills: 1      CONTINUE these medications which have CHANGED   Details  esomeprazole (NEXIUM) 40 MG  capsule Take 1 capsule (40 mg total) by mouth 2 (two) times daily before a meal. Qty: 60 capsule, Refills: 1      CONTINUE these medications which have NOT CHANGED   Details  aspirin EC 81 MG tablet Take 81 mg by mouth daily.    docusate sodium 100 MG CAPS Take 200 mg by mouth 2 (two) times daily as needed for mild constipation. Qty: 25 capsule, Refills: 0    furosemide (LASIX) 20 MG tablet Take 20 mg by mouth See admin instructions. Take on Mondays Wednesday and Friday    glipiZIDE (GLUCOTROL) 10 MG tablet Take 10 mg by mouth 2 (two) times daily before a meal.     HYDROcodone-acetaminophen (NORCO/VICODIN) 5-325 MG per tablet Take 1 tablet by mouth every 6 (six) hours as needed for moderate pain.    LEVEMIR FLEXTOUCH 100 UNIT/ML Pen Inject 15 Units into the skin daily at 10 pm.     linagliptin (TRADJENTA) 5 MG TABS tablet Take 5 mg by mouth daily.    memantine (NAMENDA) 10 MG tablet Take 1 tablet (10 mg total) by mouth 2 (two) times daily. Qty: 60 tablet, Refills: 6    metFORMIN (GLUCOPHAGE) 500 MG tablet Take 500 mg by mouth 2 (two) times daily with a meal.     Multiple Vitamin (MULTIVITAMIN WITH MINERALS) TABS tablet Take 1 tablet by mouth daily.    ondansetron (ZOFRAN) 4 MG tablet Take 4 mg by mouth every 8 (eight) hours as needed for nausea or vomiting.  Refills: 0    simvastatin (ZOCOR) 40 MG tablet Take 40 mg by mouth every morning.     meloxicam (MOBIC) 7.5 MG tablet Take 7.5 mg by mouth daily as needed for pain.      STOP taking these medications     Ginkgo Biloba (GINKGO PO)      lisinopril (PRINIVIL,ZESTRIL) 2.5 MG tablet      ranitidine (ZANTAC) 75 MG tablet        Allergies  Allergen Reactions  . Donepezil     Medication listed but no reaction stated; family not sure of reaction  . Hydrocodone Nausea And Vomiting   Follow-up Information    Follow up with [facility].   Why:  hhpt   Contact information:   [address] [place] [address] [phone]       Follow up with [facility].   Why:  bsc   Contact information:   [address] [place] [address] [phone]       The results of significant diagnostics from this hospitalization (including imaging, microbiology, ancillary and laboratory) are listed below for reference.    Significant Diagnostic Studies: Dg Chest Portable 1 View  04/01/2015   CLINICAL DATA:  Shortness of breath, weakness  EXAM: PORTABLE CHEST - 1 VIEW  COMPARISON:  08/23/2014  FINDINGS: Cardiac leads obscure detail. Right AC joint degenerative change. Heart size is mildly enlarged without evidence for edema. No focal pulmonary opacity. No pleural effusion.  IMPRESSION: Mild cardiomegaly without focal acute finding.   Electronically Signed   By: Conchita Paris M.D.   On: 04/01/2015 18:39   Ct Renal Stone Study  04/01/2015   CLINICAL DATA:  81 year old  male with nausea abdominal pain, abdominal distention, shortness of breath, weakness. Initial encounter.  EXAM: CT ABDOMEN AND PELVIS WITHOUT CONTRAST  TECHNIQUE: Multidetector CT imaging of the abdomen and pelvis was performed following the standard protocol without IV contrast.  COMPARISON:  CT Abdomen and Pelvis 08/23/2014.  FINDINGS: Mild motion artifact at the lung bases. Stable cardiomegaly. No pericardial effusion. Stable scarring or atelectasis in the right costophrenic angle.  Advanced degenerative changes in the spine with bulky osteophytosis. Multilevel lumbar spinal stenosis. No acute osseous abnormality identified.  Sequelae of prostatectomy. No pelvic free fluid. Diminutive bladder. Negative rectum. Decompressed small bowel loops in the pelvis.  Diverticulosis of the sigmoid colon with no active inflammation identified. Negative left colon aside from retained stool. Redundant transverse colon with retained stool. Negative right colon. Normal appendix. Negative terminal ileum. No dilated  small bowel small hiatal hernia. Decompressed stomach. Negative duodenum.  Noncontrast liver with chronic left lateral lobe hemangioma which is better demonstrated on the prior contrast-enhanced study. Negative noncontrast CT appearance of the gallbladder. Noncontrast spleen, pancreas and adrenal glands are within normal limits. No hydronephrosis. Chronic left renal cysts. No nephrolithiasis. No hydroureter or ureteral calculus. No abdominal free fluid. Aortoiliac calcified atherosclerosis noted. No lymphadenopathy identified. Small fat containing inguinal hernias are stable.  IMPRESSION: 1. No urologic calculi. No acute or inflammatory process identified in the abdomen or pelvis. 2. Chronic findings including sigmoid diverticulosis, Aortoiliac calcified atherosclerosis, Advanced spine degeneration with lumbar spinal stenosis.   Electronically Signed   By: Genevie Ann M.D.   On: 04/01/2015 18:56   Labs: Basic Metabolic Panel:  Recent Labs Lab 04/01/15 1530 04/02/15 0500 04/03/15 0305 04/04/15 0706  NA 133* 138 137 138  K 4.7 3.8 4.0 3.9  CL 100* 104 107 108  CO2 26 26 26 23   GLUCOSE 222* 67 115* 76  BUN 20 17 15 16   CREATININE 1.52* 1.38* 1.26* 1.56*  CALCIUM 9.5 8.8* 8.4* 8.2*   Liver Function Tests:  Recent Labs Lab 04/01/15 1530  AST 28  ALT 17  ALKPHOS 79  BILITOT 1.1  PROT 7.6  ALBUMIN 3.5    Recent Labs Lab 04/01/15 1530  LIPASE 33   CBC:  Recent Labs Lab 04/01/15 1530 04/02/15 0500  WBC 10.7* 9.1  NEUTROABS 7.8*  --   HGB 14.0 11.8*  HCT 41.5 35.0*  MCV 82.2 81.2  PLT 199 167   Cardiac Enzymes:  Recent Labs Lab 04/01/15 1530  TROPONINI 0.03   CBG:  Recent Labs Lab 04/03/15 2117 04/03/15 2121 04/04/15 0738 04/04/15 0759 04/04/15 1200  GLUCAP 95 92 61* 72 99    Signed:  Barton Dubois  [facility] 04/04/2015, 1:33 PM

## 2015-04-04 NOTE — Evaluation (Signed)
Occupational Therapy Evaluation Patient Details Name: Marvin VANLEER Sr. MRN: [id number] DOB: [birth year] Today's Date: 04/04/2015    History of Present Illness HPI: Marvin Bishop. is a 81 y.o. male with h/o DM, dementia. Patient presents to ED with c/o lower abdominal discomfort, generalized weakness, nausea. Symptoms ongoing at home for past few days, no sick contacts. Poor PO intake, no vomiting nor blood in stools. Lives with daughter at baseline due to dementia.   Clinical Impression   Pt with decline in function and safety with ADLs and ADL mobility with decreased strength, balance, endurance and cognition. No further acute OT services are indicated, defer further OT intervention to [facility]     Follow Up Recommendations  Home health OT;Supervision/Assistance - 24 hour    Equipment Recommendations  Tub/shower seat    Recommendations for Other Services       Precautions / Restrictions Precautions Precautions: Fall Restrictions Weight Bearing Restrictions: No      Mobility Bed Mobility Overal bed mobility: Modified Independent             General bed mobility comments: Increased time and cues to complete task  Transfers Overall transfer level: Needs assistance Equipment used: 1 person hand held assist;Rolling walker (2 wheeled) Transfers: Sit to/from Stand Sit to Stand: Min assist         General transfer comment: min assist to steady; noted tends to brace backs of LEs against bed    Balance Overall balance assessment: Needs assistance Sitting-balance support: No upper extremity supported;Feet supported Sitting balance-Leahy Scale: Good     Standing balance support: During functional activity Standing balance-Leahy Scale: Poor                              ADL Overall ADL's : Needs assistance/impaired     Grooming: Wash/dry hands;Wash/dry face;Min guard;Standing   Upper Body Bathing: Supervision/ safety;Set up;Standing   Lower Body  Bathing: Minimal assistance;Sit to/from stand   Upper Body Dressing : Supervision/safety;Set up;Sitting   Lower Body Dressing: Sit to/from stand;Minimal assistance   Toilet Transfer: Minimal assistance;RW   Toileting- Clothing Manipulation and Hygiene: Min guard;Sit to/from stand       Functional mobility during ADLs: Minimal assistance;Rolling walker       Vision  no change from baseline   Perception Perception Perception Tested?: No   Praxis Praxis Praxis tested?: Not tested    Pertinent Vitals/Pain Pain Assessment: No/denies pain     Hand Dominance Left   Extremity/Trunk Assessment Upper Extremity Assessment Upper Extremity Assessment: Overall WFL for tasks assessed;Generalized weakness   Lower Extremity Assessment Lower Extremity Assessment: Defer to PT evaluation   Cervical / Trunk Assessment Cervical / Trunk Assessment: Normal   Communication Communication Communication: No difficulties   Cognition Arousal/Alertness: Awake/alert Behavior During Therapy: WFL for tasks assessed/performed Overall Cognitive Status: No family/caregiver present to determine baseline cognitive functioning       Memory: Decreased short-term memory             General Comments   pt pleasant and cooperative                 Home Living Family/patient expects to be discharged to:: Private residence Living Arrangements: Children Available Help at Discharge: Family;Available PRN/intermittently Type of Home: House Home Access: Stairs to enter [employer] of Steps: 4 Entrance Stairs-Rails: Right;Left;Can reach both Home Layout: One level     Bathroom Shower/Tub: Tub/shower unit;Walk-in shower  Bathroom Toilet: Standard     Home Equipment: Cane - single point;Walker - 2 wheels          Prior Functioning/Environment Level of Independence: Needs assistance  Gait / Transfers Assistance Needed: Reports no porblems; as of 7 months ago (per PT note  from that admission), he did not use a RW; he reports he has one now ADL's / Homemaking Assistance Needed: ind BADLs; does not drive; daughter shops for him; friend does yardwork        OT Diagnosis: Generalized weakness   OT Problem List: Decreased strength;Decreased activity tolerance;Decreased knowledge of use of DME or AE;Decreased cognition   OT Treatment/Interventions:      OT Goals(Current goals can be found in the care plan section) Acute Rehab OT Goals Patient Stated Goal: go home today OT Goal Formulation: With patient  OT Frequency:     Barriers to D/C:  none                        End of Session Equipment Utilized During Treatment: Gait belt;Rolling walker  Activity Tolerance: Patient tolerated treatment well Patient left: in chair;with chair alarm set   Time: 1040-1104 OT Time Calculation (min): 24 min Charges:  OT General Charges $OT Visit: 1 Procedure OT Evaluation $Initial OT Evaluation Tier I: 1 Procedure OT Treatments $Therapeutic Activity: 8-22 mins G-Codes: OT G-codes **NOT FOR INPATIENT CLASS** Functional Limitation: Self care Self Care Current Status (E2800): At least 20 percent but less than 40 percent impaired, limited or restricted Self Care Goal Status (L4917): At least 20 percent but less than 40 percent impaired, limited or restricted Self Care Discharge Status [phone]): At least 20 percent but less than 40 percent impaired, limited or restricted  Britt Bottom 04/04/2015, 2:08 PM

## 2015-04-04 NOTE — Plan of Care (Signed)
Pt BS just under 70, but still asymptomatic. Given few swallows of OJ.

## 2015-04-04 NOTE — Progress Notes (Signed)
Physical Therapy Treatment Patient Details Name: Marvin Bishop Sr. MRN: [id number] DOB: 1920/08/21 Today's Date: 04/04/2015    History of Present Illness HPI: Marvin Bishop. is a 81 y.o. male with h/o DM, dementia. Patient presents to ED with c/o lower abdominal discomfort, generalized weakness, nausea. Symptoms ongoing at home for past few days, no sick contacts. Poor PO intake, no vomiting nor blood in stools. Lives with daughter at baseline due to dementia.    PT Comments    Did not observe progress today as his activity tolerance was limited; HR initial at rest in chair 133; incr to 144 with simple walking back to bed; this tachycardia was accompanied by general malaise ("I feel lousy");   Marvin Key, RN was present during session, and Dr. Dyann Bishop was notified of patient status; Dr. Dyann Bishop had spoken with pt's daughter earlier today, and she indicated she would be home around the clock initially; she also indicated that Marvin Bishop is monitored by camera when alone at home; Marvin Bishop also qualifies for more home services through the [place]; I agree with maximizing services  Follow Up Recommendations  Home health PT;Supervision/Assistance - 24 hour     Equipment Recommendations  Rolling walker with 5" wheels;3in1 (PT) (I believe pt already has RW)    Recommendations for Other Services Other (comment) (Case Management to introduce options for adult day care)     Precautions / Restrictions Precautions Precautions: Fall Restrictions Weight Bearing Restrictions: No    Mobility  Bed Mobility Overal bed mobility: Modified Independent             General bed mobility comments: Increased time and cues to complete task  Transfers Overall transfer level: Needs assistance Equipment used: Rolling walker (2 wheeled) Transfers: Sit to/from Stand Sit to Stand: Mod assist         General transfer comment: light mod assist to power up to stand; cues for hand placement and  safety  Ambulation/Gait Ambulation/Gait assistance: Min assist Ambulation Distance (Feet): 10 Feet Assistive device: Rolling walker (2 wheeled) Gait Pattern/deviations: Step-through pattern;Trunk flexed Gait velocity: slowed   General Gait Details: Cues to self-monitor for activity tolerance   Stairs            Wheelchair Mobility    Modified Rankin (Stroke Patients Only)       Balance Overall balance assessment: Needs assistance Sitting-balance support: No upper extremity supported;Feet supported Sitting balance-Leahy Scale: Good     Standing balance support: During functional activity Standing balance-Leahy Scale: Poor                      Cognition Arousal/Alertness: Awake/alert Behavior During Therapy: WFL for tasks assessed/performed Overall Cognitive Status: No family/caregiver present to determine baseline cognitive functioning       Memory: Decreased short-term memory              Exercises      General Comments General comments (skin integrity, edema, etc.): Discussed pt status with Marvin Key, RN; O2 sats on Room Air 100%; temp normal; pt with appearance of general malaise; MD notified of tachy HR      Pertinent Vitals/Pain Pain Assessment: No/denies pain (just stating is very cold and feeling lousy)    Home Living Family/patient expects to be discharged to:: Private residence Living Arrangements: Children Available Help at Discharge: Family;Available PRN/intermittently Type of Home: House Home Access: Stairs to enter Entrance Stairs-Rails: Right;Left;Can reach both Home Layout: One level Home Equipment: Cane - single  point;Walker - 2 wheels      Prior Function Level of Independence: Needs assistance  Gait / Transfers Assistance Needed: Reports no porblems; as of 7 months ago (per PT note from that admission), he did not use a RW; he reports he has one now ADL's / Homemaking Assistance Needed: ind BADLs; does not drive; daughter  shops for him; friend does yardwork     PT Goals (current goals can now be found in the care plan section) Acute Rehab PT Goals Patient Stated Goal: go home today PT Goal Formulation: With patient Time For Goal Achievement: 04/17/15 Potential to Achieve Goals: Good Progress towards PT goals: Not progressing toward goals - comment (limited by feeling lousy, tachycardia)    Frequency  Min 3X/week    PT Plan Current plan remains appropriate (Per Dr. Dyann Bishop, daughter will be home 24 hours initially)    Co-evaluation             End of Session Equipment Utilized During Treatment: Gait belt Activity Tolerance: Patient limited by fatigue Patient left: in bed;with call bell/phone within reach;with nursing/sitter in room     Time: 1422-1443 PT Time Calculation (min) (ACUTE ONLY): 21 min  Charges:  $Gait Training: 8-22 mins                    G Codes:      Marvin Bishop 04/04/2015, 4:10 PM  Marvin Bishop, [place] Pager [phone] Office [phone]

## 2015-04-04 NOTE — Progress Notes (Signed)
CBG 61. Hypoglycemia protocol initiated. Recheck CBG was 72. MD made aware.

## 2015-04-04 NOTE — Discharge Planning (Signed)
Patient's daughter notified of orders  to DC today. She stated she couldn't get here till about 6PM d/t work.  Patient will be discharged at that time. Daughter also requested that patient have a full shower before she arrives. NA informed.

## 2015-04-04 NOTE — Care Management Note (Signed)
Case Management Note  Patient Details  Name: Marvin Bishop. MRN: [id number] Date of Birth: 1920-08-20  Subjective/Objective:     NCM spoke with patient, he states to call his daughter [name], [place] called [name], she states patient lives alone, he has an aide who is there by 10 am, and then leaves at 11:30 am or 12 noon. He is there alone from 12 -6 pm but is on a monitoring system.  [name] states she will check into the [facility] for patient as well to see if they could afford that. [name] states she would like to work with [facility], referral made to [facility] for HHPT and BSC.  [place] and [place] notified.  Soc will begin 24-48 hrs post dc.               Action/Plan:   Expected Discharge Date:                  Expected Discharge Plan:  [place]  In-House Referral:     Discharge planning Services  CM Consult  Post Acute Care Choice:  Home Health Choice offered to:  Adult Children  DME Arranged:  Bedside commode DME Agency:  [place] Arranged:  PT [place] Agency:  [facility]  Status of Service:  Completed, signed off  Medicare Important Message Given:  No Date Medicare IM Given:    Medicare IM give by:    Date Additional Medicare IM Given:    Additional Medicare Important Message give by:     If discussed at [place] of Stay Meetings, dates discussed:    Additional Comments:  Zenon Mayo, RN 04/04/2015, 12:22 PM

## 2015-04-07 ENCOUNTER — Emergency Department (HOSPITAL_COMMUNITY): Payer: Medicare PPO

## 2015-04-07 ENCOUNTER — Inpatient Hospital Stay (HOSPITAL_COMMUNITY)
Admission: EM | Admit: 2015-04-07 | Discharge: 2015-04-10 | DRG: 176 | Disposition: A | Discharge status: Skilled Nursing Facility | Payer: Medicare PPO | Attending: Internal Medicine | Admitting: Internal Medicine

## 2015-04-07 ENCOUNTER — Encounter (HOSPITAL_COMMUNITY): Payer: Self-pay | Admitting: *Deleted

## 2015-04-07 DIAGNOSIS — R079 Chest pain, unspecified: Secondary | ICD-10-CM | POA: Diagnosis present

## 2015-04-07 DIAGNOSIS — R7989 Other specified abnormal findings of blood chemistry: Secondary | ICD-10-CM

## 2015-04-07 DIAGNOSIS — I248 Other forms of acute ischemic heart disease: Secondary | ICD-10-CM | POA: Diagnosis present

## 2015-04-07 DIAGNOSIS — R05 Cough: Secondary | ICD-10-CM | POA: Diagnosis not present

## 2015-04-07 DIAGNOSIS — I129 Hypertensive chronic kidney disease with stage 1 through stage 4 chronic kidney disease, or unspecified chronic kidney disease: Secondary | ICD-10-CM | POA: Diagnosis present

## 2015-04-07 DIAGNOSIS — G629 Polyneuropathy, unspecified: Secondary | ICD-10-CM | POA: Diagnosis present

## 2015-04-07 DIAGNOSIS — Z79899 Other long term (current) drug therapy: Secondary | ICD-10-CM | POA: Diagnosis not present

## 2015-04-07 DIAGNOSIS — F0390 Unspecified dementia without behavioral disturbance: Secondary | ICD-10-CM | POA: Diagnosis present

## 2015-04-07 DIAGNOSIS — Z87891 Personal history of nicotine dependence: Secondary | ICD-10-CM | POA: Diagnosis not present

## 2015-04-07 DIAGNOSIS — R778 Other specified abnormalities of plasma proteins: Secondary | ICD-10-CM

## 2015-04-07 DIAGNOSIS — I2699 Other pulmonary embolism without acute cor pulmonale: Principal | ICD-10-CM | POA: Diagnosis present

## 2015-04-07 DIAGNOSIS — R059 Cough, unspecified: Secondary | ICD-10-CM | POA: Insufficient documentation

## 2015-04-07 DIAGNOSIS — R748 Abnormal levels of other serum enzymes: Secondary | ICD-10-CM | POA: Diagnosis present

## 2015-04-07 DIAGNOSIS — Z8546 Personal history of malignant neoplasm of prostate: Secondary | ICD-10-CM | POA: Diagnosis not present

## 2015-04-07 DIAGNOSIS — Z8639 Personal history of other endocrine, nutritional and metabolic disease: Secondary | ICD-10-CM

## 2015-04-07 DIAGNOSIS — Z800 Family history of malignant neoplasm of digestive organs: Secondary | ICD-10-CM | POA: Diagnosis not present

## 2015-04-07 DIAGNOSIS — Z87442 Personal history of urinary calculi: Secondary | ICD-10-CM | POA: Diagnosis not present

## 2015-04-07 DIAGNOSIS — E119 Type 2 diabetes mellitus without complications: Secondary | ICD-10-CM | POA: Diagnosis present

## 2015-04-07 DIAGNOSIS — Z7982 Long term (current) use of aspirin: Secondary | ICD-10-CM | POA: Diagnosis not present

## 2015-04-07 DIAGNOSIS — N189 Chronic kidney disease, unspecified: Secondary | ICD-10-CM | POA: Diagnosis present

## 2015-04-07 DIAGNOSIS — Z794 Long term (current) use of insulin: Secondary | ICD-10-CM

## 2015-04-07 DIAGNOSIS — H5441 Blindness, right eye, normal vision left eye: Secondary | ICD-10-CM | POA: Diagnosis present

## 2015-04-07 LAB — MRSA PCR SCREENING: MRSA BY PCR: NEGATIVE

## 2015-04-07 LAB — COMPREHENSIVE METABOLIC PANEL
ALT: 25 U/L (ref 17–63)
AST: 36 U/L (ref 15–41)
Albumin: 3 g/dL — ABNORMAL LOW (ref 3.5–5.0)
Alkaline Phosphatase: 69 U/L (ref 38–126)
Anion gap: 5 (ref 5–15)
BILIRUBIN TOTAL: 1.5 mg/dL — AB (ref 0.3–1.2)
BUN: 17 mg/dL (ref 6–20)
CALCIUM: 8.4 mg/dL — AB (ref 8.9–10.3)
CHLORIDE: 105 mmol/L (ref 101–111)
CO2: 27 mmol/L (ref 22–32)
Creatinine, Ser: 1.49 mg/dL — ABNORMAL HIGH (ref 0.61–1.24)
GFR calc Af Amer: 45 mL/min — ABNORMAL LOW (ref >60)
GFR calc non Af Amer: 39 mL/min — ABNORMAL LOW (ref >60)
Glucose, Bld: 77 mg/dL (ref 65–99)
Potassium: 4.1 mmol/L (ref 3.5–5.1)
SODIUM: 137 mmol/L (ref 135–145)
Total Protein: 6.8 g/dL (ref 6.5–8.1)

## 2015-04-07 LAB — D-DIMER, QUANTITATIVE: D-Dimer, Quant: 11.44 ug/mL-FEU — ABNORMAL HIGH (ref 0.00–0.48)

## 2015-04-07 LAB — URINALYSIS, ROUTINE W REFLEX MICROSCOPIC
Bilirubin Urine: NEGATIVE
Glucose, UA: NEGATIVE mg/dL
Hgb urine dipstick: NEGATIVE
KETONES UR: NEGATIVE mg/dL
LEUKOCYTES UA: NEGATIVE
NITRITE: NEGATIVE
PH: 5.5 (ref 5.0–8.0)
PROTEIN: NEGATIVE mg/dL
SPECIFIC GRAVITY, URINE: 1.042 — AB (ref 1.005–1.030)
UROBILINOGEN UA: 2 mg/dL — AB (ref 0.0–1.0)

## 2015-04-07 LAB — CBC WITH DIFFERENTIAL/PLATELET
Basophils Absolute: 0 10*3/uL (ref 0.0–0.1)
Basophils Relative: 0 % (ref 0–1)
EOS ABS: 0.2 10*3/uL (ref 0.0–0.7)
EOS PCT: 1 % (ref 0–5)
HEMATOCRIT: 34.7 % — AB (ref 39.0–52.0)
Hemoglobin: 11.4 g/dL — ABNORMAL LOW (ref 13.0–17.0)
Lymphocytes Relative: 9 % — ABNORMAL LOW (ref 12–46)
Lymphs Abs: 0.9 10*3/uL (ref 0.7–4.0)
MCH: 27 pg (ref 26.0–34.0)
MCHC: 32.9 g/dL (ref 30.0–36.0)
MCV: 82.2 fL (ref 78.0–100.0)
Monocytes Absolute: 1.5 10*3/uL — ABNORMAL HIGH (ref 0.1–1.0)
Monocytes Relative: 14 % — ABNORMAL HIGH (ref 3–12)
Neutro Abs: 8.2 10*3/uL — ABNORMAL HIGH (ref 1.7–7.7)
Neutrophils Relative %: 76 % (ref 43–77)
PLATELETS: 282 10*3/uL (ref 150–400)
RBC: 4.22 MIL/uL (ref 4.22–5.81)
RDW: 14.5 % (ref 11.5–15.5)
WBC: 10.8 10*3/uL — AB (ref 4.0–10.5)

## 2015-04-07 LAB — TROPONIN I: Troponin I: 0.37 ng/mL — ABNORMAL HIGH (ref <0.031)

## 2015-04-07 LAB — I-STAT TROPONIN, ED
TROPONIN I, POC: 0.39 ng/mL — AB (ref 0.00–0.08)
Troponin i, poc: 0.38 ng/mL (ref 0.00–0.08)

## 2015-04-07 LAB — PROTIME-INR
INR: 1.3 (ref 0.00–1.49)
Prothrombin Time: 16.4 seconds — ABNORMAL HIGH (ref 11.6–15.2)

## 2015-04-07 LAB — LIPASE, BLOOD: Lipase: 14 U/L — ABNORMAL LOW (ref 22–51)

## 2015-04-07 LAB — HEPARIN LEVEL (UNFRACTIONATED)

## 2015-04-07 LAB — APTT: aPTT: 33 seconds (ref 24–37)

## 2015-04-07 MED ORDER — SODIUM CHLORIDE 0.9 % IV SOLN
INTRAVENOUS | Status: DC
  Administered 2015-04-07: 21:00:00 via INTRAVENOUS

## 2015-04-07 MED ORDER — SODIUM CHLORIDE 0.9 % IJ SOLN
3.0000 mL | Freq: Two times a day (BID) | INTRAMUSCULAR | Status: DC
  Administered 2015-04-07 – 2015-04-10 (×5): 3 mL via INTRAVENOUS

## 2015-04-07 MED ORDER — ACETAMINOPHEN 325 MG PO TABS
650.0000 mg | ORAL_TABLET | Freq: Four times a day (QID) | ORAL | Status: DC | PRN
  Administered 2015-04-07: 650 mg via ORAL
  Filled 2015-04-07: qty 2

## 2015-04-07 MED ORDER — MEMANTINE HCL 10 MG PO TABS
10.0000 mg | ORAL_TABLET | Freq: Two times a day (BID) | ORAL | Status: DC
  Administered 2015-04-07 – 2015-04-10 (×6): 10 mg via ORAL
  Filled 2015-04-07 (×3): qty 2
  Filled 2015-04-07 (×4): qty 1
  Filled 2015-04-07: qty 2
  Filled 2015-04-07: qty 1

## 2015-04-07 MED ORDER — ACETAMINOPHEN 650 MG RE SUPP: 650.0000 mg | Freq: Four times a day (QID) | RECTAL | Status: DC | PRN

## 2015-04-07 MED ORDER — ACETAMINOPHEN 325 MG PO TABS
650.0000 mg | ORAL_TABLET | Freq: Four times a day (QID) | ORAL | Status: DC | PRN
  Administered 2015-04-08 (×2): 650 mg via ORAL
  Filled 2015-04-07 (×3): qty 2

## 2015-04-07 MED ORDER — INSULIN ASPART 100 UNIT/ML ~~LOC~~ SOLN
0.0000 [IU] | Freq: Three times a day (TID) | SUBCUTANEOUS | Status: DC
  Administered 2015-04-08: 5 [IU] via SUBCUTANEOUS
  Administered 2015-04-09: 3 [IU] via SUBCUTANEOUS

## 2015-04-07 MED ORDER — ASPIRIN 325 MG PO TABS
325.0000 mg | ORAL_TABLET | Freq: Once | ORAL | Status: AC
  Administered 2015-04-07: 325 mg via ORAL
  Filled 2015-04-07: qty 1

## 2015-04-07 MED ORDER — ADULT MULTIVITAMIN W/MINERALS CH
1.0000 | ORAL_TABLET | Freq: Every day | ORAL | Status: DC
  Administered 2015-04-08 – 2015-04-10 (×3): 1 via ORAL
  Filled 2015-04-07 (×3): qty 1

## 2015-04-07 MED ORDER — DOCUSATE SODIUM 100 MG PO CAPS
100.0000 mg | ORAL_CAPSULE | Freq: Two times a day (BID) | ORAL | Status: DC
  Administered 2015-04-08 – 2015-04-10 (×5): 100 mg via ORAL
  Filled 2015-04-07 (×7): qty 1

## 2015-04-07 MED ORDER — PANTOPRAZOLE SODIUM 40 MG PO TBEC
80.0000 mg | DELAYED_RELEASE_TABLET | Freq: Every day | ORAL | Status: DC
  Administered 2015-04-08 – 2015-04-10 (×3): 80 mg via ORAL
  Filled 2015-04-07 (×3): qty 2

## 2015-04-07 MED ORDER — INSULIN DETEMIR 100 UNIT/ML ~~LOC~~ SOLN
10.0000 [IU] | Freq: Every day | SUBCUTANEOUS | Status: DC
  Administered 2015-04-07 – 2015-04-09 (×3): 10 [IU] via SUBCUTANEOUS
  Filled 2015-04-07 (×5): qty 0.1

## 2015-04-07 MED ORDER — NITROGLYCERIN 0.4 MG SL SUBL
0.4000 mg | SUBLINGUAL_TABLET | SUBLINGUAL | Status: AC | PRN
  Administered 2015-04-07 (×3): 0.4 mg via SUBLINGUAL
  Filled 2015-04-07: qty 1

## 2015-04-07 MED ORDER — FAMOTIDINE 20 MG PO TABS
20.0000 mg | ORAL_TABLET | Freq: Every day | ORAL | Status: DC
  Administered 2015-04-07 – 2015-04-09 (×3): 20 mg via ORAL
  Filled 2015-04-07 (×5): qty 1

## 2015-04-07 MED ORDER — SIMVASTATIN 40 MG PO TABS
40.0000 mg | ORAL_TABLET | Freq: Every morning | ORAL | Status: DC
  Administered 2015-04-08 – 2015-04-10 (×3): 40 mg via ORAL
  Filled 2015-04-07 (×3): qty 1

## 2015-04-07 MED ORDER — HEPARIN (PORCINE) IN NACL 100-0.45 UNIT/ML-% IJ SOLN
1200.0000 [IU]/h | INTRAMUSCULAR | Status: DC
  Administered 2015-04-07: 1200 [IU]/h via INTRAVENOUS
  Filled 2015-04-07 (×2): qty 250

## 2015-04-07 MED ORDER — DOCUSATE SODIUM 100 MG PO CAPS: 200.0000 mg | ORAL_CAPSULE | Freq: Two times a day (BID) | ORAL | Status: DC | PRN

## 2015-04-07 MED ORDER — ONDANSETRON HCL 4 MG PO TABS
4.0000 mg | ORAL_TABLET | Freq: Three times a day (TID) | ORAL | Status: DC | PRN
  Administered 2015-04-10: 4 mg via ORAL
  Filled 2015-04-07: qty 1

## 2015-04-07 MED ORDER — MAGNESIUM CITRATE PO SOLN: 1.0000 | Freq: Once | ORAL | Status: AC | PRN

## 2015-04-07 MED ORDER — HEPARIN BOLUS VIA INFUSION
2500.0000 [IU] | INTRAVENOUS | Status: AC
  Administered 2015-04-07: 2500 [IU] via INTRAVENOUS
  Filled 2015-04-07: qty 2500

## 2015-04-07 MED ORDER — BISACODYL 10 MG RE SUPP: 10.0000 mg | Freq: Every day | RECTAL | Status: DC | PRN

## 2015-04-07 MED ORDER — POLYETHYLENE GLYCOL 3350 17 G PO PACK: 17.0000 g | PACK | Freq: Every day | ORAL | Status: DC | PRN

## 2015-04-07 MED ORDER — HEPARIN BOLUS VIA INFUSION
4000.0000 [IU] | Freq: Once | INTRAVENOUS | Status: AC
  Administered 2015-04-07: 4000 [IU] via INTRAVENOUS
  Filled 2015-04-07: qty 4000

## 2015-04-07 MED ORDER — IOHEXOL 350 MG/ML SOLN
100.0000 mL | Freq: Once | INTRAVENOUS | Status: AC | PRN
  Administered 2015-04-07: 100 mL via INTRAVENOUS

## 2015-04-07 MED ORDER — HEPARIN (PORCINE) IN NACL 100-0.45 UNIT/ML-% IJ SOLN
1500.0000 [IU]/h | INTRAMUSCULAR | Status: DC
  Administered 2015-04-07 (×2): 1500 [IU]/h via INTRAVENOUS
  Filled 2015-04-07 (×2): qty 250

## 2015-04-07 NOTE — ED Notes (Signed)
Pt is having chest pain.  RN aware of need for urine.

## 2015-04-07 NOTE — ED Notes (Signed)
AWARE OF X-RAY PORTABLE PENDING.

## 2015-04-07 NOTE — ED Notes (Signed)
Put stickey socks on patient.

## 2015-04-07 NOTE — ED Notes (Signed)
PT CONTINUES TO C/O OF CHEST PAIN EKG PERFORMED.  PAIN UNCHANGED

## 2015-04-07 NOTE — ED Notes (Signed)
Dr. Gentry given results 

## 2015-04-07 NOTE — H&P (Signed)
Patient Demographics  Marvin Bishop, is a 81 y.o. male  MRN: [id number]   DOB - 1920/04/19  Admit Date - 04/07/2015  Outpatient Primary MD for the patient is [name], MD   With History of -  Past Medical History  Diagnosis Date  . Arthritis   . Hypertension   . Hypercholesteremia   . Insomnia     occasional  . Nephrolithiasis     right UVJ stone  . Dementia   . Memory loss   . Headache(784.0)     slight  . Asthma     hx of as child  . PONV (postoperative nausea and vomiting)   . Complete loss of vision     right eye  . Peripheral neuropathy     both legs  . Prostate cancer   . Diabetes mellitus       Past Surgical History  Procedure Laterality Date  . Appendectomy    . Pars plana vitrectomy  10/24/2011    Procedure: PARS PLANA VITRECTOMY WITH 23 GAUGE;  Surgeon: Adonis Brook, MD;  Location: [facility];  Service: Ophthalmology;  Laterality: Right;  . Tonsillectomy  as teen  . Knee arthroscopy Right   . Hemorroidectomy    . Cystoscopy w/ ureteral stent placement Right 10/01/2013    Procedure: cystoscopy right retrograde holmium laser lithotripsy;  Surgeon: Franchot Gallo, MD;  Location: [facility];  Service: Urology;  Laterality: Right;  holmium laser application  . Cornea removed Right 02-26-15    in for   Chief Complaint  Patient presents with  . Chest Pain  . Abdominal Pain     HPI  [name]  is a 81 y.o. male, past medical history of dementia, diabetes mellitus, nephrolithiasis, resents with complaints of chest pain , patient is with advanced dementia, can't provide review of system or history, and was obtained from him he staff, and daughters at bedside, patient was recently hospitalized at [facility], with complaints of abdominal pain, negative workup, discharged on 04/04/15, resents today with complaints of chest pain, in ED patient had elevated troponins, as well workup was significant for elevated d-dimer is, so at CT chest angiogram done,  which came back positive for PE, with RV to LV ratio of 1.2, with complete left pulmonary artery occlusion, and segmental obstruction of the right pulmonary artery system, patient was already on heparin drip out of suspicion for ACS, at the time of my exam and evaluation, patient denies any chest pain, or any complaints, as mentioned earlier he is very reliable historian. As well patient was noticed to have lower extremity edema, reports it's been swollen for 1 week.    Review of Systems   Try to obtain from patient, review of systems is unreliable from the patient given his dementia.    Social History History  Substance Use Topics  . Smoking status: Former Smoker -- 0.25 packs/day    Types: Cigarettes    Quit date: 10/15/1966  . Smokeless tobacco: Never Used  . Alcohol Use: 0.0 oz/week     Comment: occasional    Family History Family History  Problem Relation Age of Onset  . Cancer Mother     uterus  . Heart Problems Father   . Cancer Brother     stomach  . Cancer Brother     throat  . Colon cancer Brother     Prior to Admission medications   Medication Sig Start Date End Date Taking? Authorizing Provider  aspirin EC 81 MG tablet Take 81 mg by mouth daily.   Yes Historical Provider, MD  docusate sodium 100 MG CAPS Take 200 mg by mouth 2 (two) times daily as needed for mild constipation. 08/25/14  Yes Thurnell Lose, MD  esomeprazole (NEXIUM) 40 MG capsule Take 1 capsule (40 mg total) by mouth 2 (two) times daily before a meal. 04/04/15  Yes Barton Dubois, MD  famotidine (PEPCID) 20 MG tablet Take 1 tablet (20 mg total) by mouth at bedtime. 04/04/15  Yes Barton Dubois, MD  furosemide (LASIX) 20 MG tablet Take 20 mg by mouth See admin instructions. Take on Mondays Wednesday and Friday   Yes Historical Provider, MD  glipiZIDE (GLUCOTROL) 10 MG tablet Take 10 mg by mouth 2 (two) times daily before a meal.    Yes Historical Provider, MD  HYDROcodone-acetaminophen (NORCO/VICODIN)  5-325 MG per tablet Take 1 tablet by mouth every 6 (six) hours as needed for moderate pain.   Yes Historical Provider, MD  LEVEMIR FLEXTOUCH 100 UNIT/ML Pen Inject 15 Units into the skin daily at 10 pm.  02/09/14  Yes Historical Provider, MD  linagliptin (TRADJENTA) 5 MG TABS tablet Take 5 mg by mouth daily.   Yes Historical Provider, MD  meloxicam (MOBIC) 7.5 MG tablet Take 7.5 mg by mouth daily as needed for pain.   Yes Historical Provider, MD  memantine (NAMENDA) 10 MG tablet Take 1 tablet (10 mg total) by mouth 2 (two) times daily. 03/17/15  Yes Dennie Bible, NP  metFORMIN (GLUCOPHAGE) 500 MG tablet Take 500 mg by mouth 2 (two) times daily with a meal.  03/16/15  Yes Historical Provider, MD  Multiple Vitamin (MULTIVITAMIN WITH MINERALS) TABS tablet Take 1 tablet by mouth daily.   Yes Historical Provider, MD  ondansetron (ZOFRAN) 4 MG tablet Take 4 mg by mouth every 8 (eight) hours as needed for nausea or vomiting.  03/08/15  Yes Historical Provider, MD  simvastatin (ZOCOR) 40 MG tablet Take 40 mg by mouth every morning.    Yes Historical Provider, MD    Allergies  Allergen Reactions  . Donepezil     Medication listed but no reaction stated; family not sure of reaction  . Hydrocodone Nausea And Vomiting    Physical Exam  Vitals  Blood pressure 116/65, pulse 103, temperature 98 F (36.7 C), temperature source Oral, resp. rate 27, weight 96.616 kg (213 lb), SpO2 100 %.   1. General elderly male lying in bed in NAD,    2. Normal affect , poor insight and cognition, Not Suicidal or Homicidal, Awake Alert, Oriented to name and place only.  3. No F.N deficits, ALL C.Nerves Intact, Strength 5/5 all 4 extremities, Sensation intact all 4 extremities, Plantars down going.  4. Right eye blindness. Moist Oral Mucosa.  5. Supple Neck, No JVD, No cervical lymphadenopathy appriciated, No Carotid Bruits.  6. Symmetrical Chest wall movement, Good air movement bilaterally, CTAB.  7. Cardiac,  No Gallops, Rubs or Murmurs, No Parasternal Heave.  8. Positive Bowel Sounds, Abdomen Soft, No tenderness, No organomegaly appriciated,No rebound -guarding or rigidity.  9.  No Cyanosis, Normal Skin Turgor, No Skin Rash or Bruise.  10. Good muscle tone,  joints appear normal , no effusions, Normal ROM.  11. No Palpable Lymph Nodes in Neck or Axillae, +2 bilateral lower extremity edema    Data Review  CBC  Recent Labs Lab 04/01/15 1530 04/02/15 0500 04/07/15 0930  WBC 10.7* 9.1 10.8*  HGB 14.0 11.8*  11.4*  HCT 41.5 35.0* 34.7*  PLT 199 167 282  MCV 82.2 81.2 82.2  MCH 27.7 27.4 27.0  MCHC 33.7 33.7 32.9  RDW 14.5 14.3 14.5  LYMPHSABS 1.3  --  0.9  MONOABS 1.4*  --  1.5*  EOSABS 0.2  --  0.2  BASOSABS 0.0  --  0.0   ------------------------------------------------------------------------------------------------------------------  Chemistries   Recent Labs Lab 04/01/15 1530 04/02/15 0500 04/03/15 0305 04/04/15 0706 04/07/15 0930  NA 133* 138 137 138 137  K 4.7 3.8 4.0 3.9 4.1  CL 100* 104 107 108 105  CO2 26 26 26 23 27   GLUCOSE 222* 67 115* 76 77  BUN 20 17 15 16 17   CREATININE 1.52* 1.38* 1.26* 1.56* 1.49*  CALCIUM 9.5 8.8* 8.4* 8.2* 8.4*  AST 28  --   --   --  36  ALT 17  --   --   --  25  ALKPHOS 79  --   --   --  69  BILITOT 1.1  --   --   --  1.5*   ------------------------------------------------------------------------------------------------------------------ estimated creatinine clearance is 36.8 mL/min (by C-G formula based on Cr of 1.49). ------------------------------------------------------------------------------------------------------------------ No results for input(s): TSH, T4TOTAL, T3FREE, THYROIDAB in the last 72 hours.  Invalid input(s): FREET3   Coagulation profile  Recent Labs Lab 04/07/15 0930  INR 1.30    -------------------------------------------------------------------------------------------------------------------  Recent Labs  04/07/15 0930  DDIMER 11.44*   -------------------------------------------------------------------------------------------------------------------  Cardiac Enzymes  Recent Labs Lab 04/01/15 1530  TROPONINI 0.03   ------------------------------------------------------------------------------------------------------------------ Invalid input(s): POCBNP   ---------------------------------------------------------------------------------------------------------------  Urinalysis    Component Value Date/Time   COLORURINE YELLOW 04/01/2015 1800   APPEARANCEUR CLEAR 04/01/2015 1800   LABSPEC 1.007 04/01/2015 1800   PHURINE 5.0 04/01/2015 1800   GLUCOSEU NEGATIVE 04/01/2015 1800   HGBUR NEGATIVE 04/01/2015 1800   BILIRUBINUR NEGATIVE 04/01/2015 1800   KETONESUR NEGATIVE 04/01/2015 1800   PROTEINUR NEGATIVE 04/01/2015 1800   UROBILINOGEN 0.2 04/01/2015 1800   NITRITE NEGATIVE 04/01/2015 1800   LEUKOCYTESUR NEGATIVE 04/01/2015 1800    ----------------------------------------------------------------------------------------------------------------  Imaging results:   Dg Chest 2 View  04/07/2015   CLINICAL DATA:  Left-sided chest pain and cough for 1 week  EXAM: CHEST  2 VIEW  COMPARISON:  April 01, 2015  FINDINGS: There is a nipple shadow on the right. There is no edema or consolidation. Heart size is upper normal with pulmonary vascularity within normal limits. No adenopathy. There is degenerative change in the thoracic spine.  IMPRESSION: No edema or consolidation.   Electronically Signed   By: Lowella Grip III M.D.   On: 04/07/2015 10:05   Ct Angio Chest Pe W/cm &/or Wo Cm  04/07/2015   CLINICAL DATA:  Chest pain, shortness of breath since yesterday.  EXAM: CT ANGIOGRAPHY CHEST WITH CONTRAST  TECHNIQUE: Multidetector CT imaging of the chest was  performed using the standard protocol during bolus administration of intravenous contrast. Multiplanar CT image reconstructions and MIPs were obtained to evaluate the vascular anatomy.  CONTRAST:  154mL OMNIPAQUE IOHEXOL 350 MG/ML SOLN  COMPARISON:  None.  FINDINGS: Chest wall: No chest wall mass, supraclavicular or axillary lymphadenopathy. The left thyroid lobe contains a 15 mm nodule posteriorly. Ultrasound followup is suggested. The bony thorax is intact. No acute bony findings or destructive bony changes. Advanced degenerative changes involving the thoracic spine and sternoclavicular joints.  Mediastinum: The heart is borderline in size. No pericardial effusion. The aorta is normal in caliber. Moderate atherosclerotic calcifications. No  dissection. There are three-vessel coronary artery calcifications. No mediastinal or hilar mass or adenopathy. The esophagus is grossly normal.  Pulmonary arteries: The main pulmonary artery is mildly enlarged. The left pulmonary artery is completely occluded by clot. There is also subsegmental pulmonary emboli on the right side. Findings of right heart strain with RV LV ratio at greater than 1 (1.2).  Lungs/pleura: There is a small left pleural effusion and patchy areas of bibasilar atelectasis. No worrisome pulmonary lesions. No pulmonary edema.  Upper abdomen:  No significant findings.  Review of the MIP images confirms the above findings.  IMPRESSION: Positive for acute PE with CT evidence of right heart strain (RV/LV Ratio = 1.2) consistent with at least submassive (intermediate risk) PE. The presence of right heart strain has been associated with an increased risk of morbidity and mortality. Please activate Code PE by paging [phone].  These results were called by telephone at the time of interpretation on 04/07/2015 at 2:35 pm to Dr. Debby Freiberg , who verbally acknowledged these results.   Electronically Signed   By: Marijo Sanes M.D.   On: 04/07/2015 14:35     My personal review of EKG: Rhythm NSR, Rate  110 /min, QTc 459 , no Acute ST changes    Assessment & Plan  Principal Problem:   PE (pulmonary embolism) Active Problems:   Diabetes mellitus   Dementia without behavioral disturbance   Elevated troponin    Pulmonary embolism - Patient showing CT evidence of submassive pulmonary embolism, patient educated 100% on room air, blood pressure is acceptable, mildly tachycardic. - Will admit to stepdown unit, already started on heparin drip, pulmonary service consulted, will check venous Doppler giving lower extremity edema, we'll check 2-D echo to evaluate for right ventricular strain.  Diabetes mellitus - We'll hold oral hypoglycemic agents, continue with Levemir, we'll start on insulin sliding scale.  Dementia without behavioral disturbances -Continue with Namenda  Elevated troponin - This is most likely in the setting of demand ischemia secondary to submassive pulmonary embolism, will monitor on telemetry, check 2-D echo, and cycle cardiac enzymes.  DVT Prophylaxis Heparin gtt  AM Labs Ordered, also please review Full Orders  Family Communication: Admission, patients condition and plan of care including tests being ordered have been discussed with the patient and daughters who indicate understanding and agree with the plan and Code Status.  Code Status Full  Likely DC to  pending further work up  Condition GUARDED    Time spent in minutes : 55 minutes    Rashiya Lofland M.D on 04/07/2015 at 3:45 PM  Between 7am to 7pm - Pager - [phone]  After 7pm go to [email or web address] - password [email or web address]  And look for the night coverage person covering me after hours  [facility] Office  [phone]

## 2015-04-07 NOTE — ED Notes (Signed)
UPON DELIVERY OF NTG.PT IS PAIN FREE. WILL HOLD NTG AT PRESENT

## 2015-04-07 NOTE — ED Provider Notes (Signed)
CSN: [id number]     Arrival date & time 04/07/15  0900 History   First MD Initiated Contact with Patient 04/07/15 [phone]     Chief Complaint  Patient presents with  . Chest Pain  . Abdominal Pain     (Consider location/radiation/quality/duration/timing/severity/associated sxs/prior Treatment) Patient is a 81 y.o. male presenting with chest pain and abdominal pain.  Chest Pain Pain location:  Substernal area Pain quality: sharp   Pain radiates to:  Does not radiate Onset quality:  Gradual Duration:  2 hours Timing:  Constant Progression:  Resolved Chronicity:  New Context: not breathing and not at rest   Worsened by:  Nothing tried Ineffective treatments:  None tried Associated symptoms: abdominal pain (seen recently for same 4 days ago with negative CT scan)   Abdominal Pain Associated symptoms: chest pain     Past Medical History  Diagnosis Date  . Arthritis   . Hypertension   . Hypercholesteremia   . Insomnia     occasional  . Nephrolithiasis     right UVJ stone  . Dementia   . Memory loss   . Headache(784.0)     slight  . Asthma     hx of as child  . PONV (postoperative nausea and vomiting)   . Complete loss of vision     right eye  . Peripheral neuropathy     both legs  . Prostate cancer   . Diabetes mellitus    Past Surgical History  Procedure Laterality Date  . Appendectomy    . Pars plana vitrectomy  10/24/2011    Procedure: PARS PLANA VITRECTOMY WITH 23 GAUGE;  Surgeon: Adonis Brook, MD;  Location: [place];  Service: Ophthalmology;  Laterality: Right;  . Tonsillectomy  as teen  . Knee arthroscopy Right   . Hemorroidectomy    . Cystoscopy w/ ureteral stent placement Right 10/01/2013    Procedure: cystoscopy right retrograde holmium laser lithotripsy;  Surgeon: Franchot Gallo, MD;  Location: [facility];  Service: Urology;  Laterality: Right;  holmium laser application  . Cornea removed Right 02-26-15   Family History  Problem Relation Age of Onset  .  Cancer Mother     uterus  . Heart Problems Father   . Cancer Brother     stomach  . Cancer Brother     throat  . Colon cancer Brother    History  Substance Use Topics  . Smoking status: Former Smoker -- 0.25 packs/day    Types: Cigarettes    Quit date: 10/15/1966  . Smokeless tobacco: Never Used  . Alcohol Use: 0.0 oz/week     Comment: occasional    Review of Systems  Cardiovascular: Positive for chest pain.  Gastrointestinal: Positive for abdominal pain (seen recently for same 4 days ago with negative CT scan).  All other systems reviewed and are negative.     Allergies  Donepezil and Hydrocodone  Home Medications   Prior to Admission medications   Medication Sig Start Date End Date Taking? Authorizing Provider  aspirin EC 81 MG tablet Take 81 mg by mouth daily.   Yes Historical Provider, MD  docusate sodium 100 MG CAPS Take 200 mg by mouth 2 (two) times daily as needed for mild constipation. 08/25/14  Yes Thurnell Lose, MD  esomeprazole (NEXIUM) 40 MG capsule Take 1 capsule (40 mg total) by mouth 2 (two) times daily before a meal. 04/04/15  Yes Barton Dubois, MD  famotidine (PEPCID) 20 MG tablet Take 1 tablet (20  mg total) by mouth at bedtime. 04/04/15  Yes Barton Dubois, MD  furosemide (LASIX) 20 MG tablet Take 20 mg by mouth See admin instructions. Take on Mondays Wednesday and Friday   Yes Historical Provider, MD  glipiZIDE (GLUCOTROL) 10 MG tablet Take 10 mg by mouth 2 (two) times daily before a meal.    Yes Historical Provider, MD  HYDROcodone-acetaminophen (NORCO/VICODIN) 5-325 MG per tablet Take 1 tablet by mouth every 6 (six) hours as needed for moderate pain.   Yes Historical Provider, MD  LEVEMIR FLEXTOUCH 100 UNIT/ML Pen Inject 15 Units into the skin daily at 10 pm.  02/09/14  Yes Historical Provider, MD  linagliptin (TRADJENTA) 5 MG TABS tablet Take 5 mg by mouth daily.   Yes Historical Provider, MD  meloxicam (MOBIC) 7.5 MG tablet Take 7.5 mg by mouth daily  as needed for pain.   Yes Historical Provider, MD  memantine (NAMENDA) 10 MG tablet Take 1 tablet (10 mg total) by mouth 2 (two) times daily. 03/17/15  Yes Dennie Bible, NP  metFORMIN (GLUCOPHAGE) 500 MG tablet Take 500 mg by mouth 2 (two) times daily with a meal.  03/16/15  Yes Historical Provider, MD  Multiple Vitamin (MULTIVITAMIN WITH MINERALS) TABS tablet Take 1 tablet by mouth daily.   Yes Historical Provider, MD  ondansetron (ZOFRAN) 4 MG tablet Take 4 mg by mouth every 8 (eight) hours as needed for nausea or vomiting.  03/08/15  Yes Historical Provider, MD  simvastatin (ZOCOR) 40 MG tablet Take 40 mg by mouth every morning.    Yes Historical Provider, MD   BP 132/71 mmHg  Pulse 98  Temp(Src) 98.4 F (36.9 C) (Oral)  Resp 21  Ht 6\' 1"  (1.854 m)  Wt 220 lb 10.9 oz (100.1 kg)  BMI 29.12 kg/m2  SpO2 100% Physical Exam  Constitutional: He is oriented to person, place, and time. He appears well-developed and well-nourished.  HENT:  Head: Normocephalic and atraumatic.  Eyes: Conjunctivae and EOM are normal.  Neck: Normal range of motion. Neck supple.  Cardiovascular: Regular rhythm and normal heart sounds.  Tachycardia present.   Pulmonary/Chest: Effort normal and breath sounds normal. No respiratory distress.  Abdominal: He exhibits no distension. There is no tenderness. There is no rebound and no guarding.  Musculoskeletal: Normal range of motion.  Neurological: He is alert and oriented to person, place, and time.  Skin: Skin is warm and dry.  Vitals reviewed.   ED Course  Procedures (including critical care time) Labs Review Labs Reviewed  COMPREHENSIVE METABOLIC PANEL - Abnormal; Notable for the following:    Creatinine, Ser 1.49 (*)    Calcium 8.4 (*)    Albumin 3.0 (*)    Total Bilirubin 1.5 (*)    GFR calc non Af Amer 39 (*)    GFR calc Af Amer 45 (*)    All other components within normal limits  CBC WITH DIFFERENTIAL/PLATELET - Abnormal; Notable for the  following:    WBC 10.8 (*)    Hemoglobin 11.4 (*)    HCT 34.7 (*)    Neutro Abs 8.2 (*)    Lymphocytes Relative 9 (*)    Monocytes Relative 14 (*)    Monocytes Absolute 1.5 (*)    All other components within normal limits  URINALYSIS, ROUTINE W REFLEX MICROSCOPIC (NOT AT [facility]) - Abnormal; Notable for the following:    Color, Urine AMBER (*)    Specific Gravity, Urine 1.042 (*)    Urobilinogen, UA 2.0 (*)  All other components within normal limits  LIPASE, BLOOD - Abnormal; Notable for the following:    Lipase 14 (*)    All other components within normal limits  PROTIME-INR - Abnormal; Notable for the following:    Prothrombin Time 16.4 (*)    All other components within normal limits  D-DIMER, QUANTITATIVE (NOT AT [facility]) - Abnormal; Notable for the following:    D-Dimer, Quant 11.44 (*)    All other components within normal limits  HEPARIN LEVEL (UNFRACTIONATED) - Abnormal; Notable for the following:    Heparin Unfractionated <0.10 (*)    All other components within normal limits  CBC - Abnormal; Notable for the following:    RBC 3.93 (*)    Hemoglobin 10.5 (*)    HCT 31.6 (*)    All other components within normal limits  BASIC METABOLIC PANEL - Abnormal; Notable for the following:    Glucose, Bld 141 (*)    Calcium 8.0 (*)    GFR calc non Af Amer 50 (*)    GFR calc Af Amer 58 (*)    All other components within normal limits  TROPONIN I - Abnormal; Notable for the following:    Troponin I 0.37 (*)    All other components within normal limits  TROPONIN I - Abnormal; Notable for the following:    Troponin I 0.27 (*)    All other components within normal limits  GLUCOSE, CAPILLARY - Abnormal; Notable for the following:    Glucose-Capillary 134 (*)    All other components within normal limits  I-STAT TROPOININ, ED - Abnormal; Notable for the following:    Troponin i, poc 0.38 (*)    All other components within normal limits  I-STAT TROPOININ, ED - Abnormal; Notable for  the following:    Troponin i, poc 0.39 (*)    All other components within normal limits  MRSA PCR SCREENING  APTT  PSA  HEPARIN LEVEL (UNFRACTIONATED)  GLUCOSE, CAPILLARY  TROPONIN I  HEPARIN LEVEL (UNFRACTIONATED)  HOMOCYSTEINE  ANA, BODY FLUID  I-STAT TROPOININ, ED    Imaging Review Dg Chest 2 View  04/07/2015   CLINICAL DATA:  Left-sided chest pain and cough for 1 week  EXAM: CHEST  2 VIEW  COMPARISON:  April 01, 2015  FINDINGS: There is a nipple shadow on the right. There is no edema or consolidation. Heart size is upper normal with pulmonary vascularity within normal limits. No adenopathy. There is degenerative change in the thoracic spine.  IMPRESSION: No edema or consolidation.   Electronically Signed   By: Lowella Grip III M.D.   On: 04/07/2015 10:05   Ct Angio Chest Pe W/cm &/or Wo Cm  04/07/2015   CLINICAL DATA:  Chest pain, shortness of breath since yesterday.  EXAM: CT ANGIOGRAPHY CHEST WITH CONTRAST  TECHNIQUE: Multidetector CT imaging of the chest was performed using the standard protocol during bolus administration of intravenous contrast. Multiplanar CT image reconstructions and MIPs were obtained to evaluate the vascular anatomy.  CONTRAST:  164mL OMNIPAQUE IOHEXOL 350 MG/ML SOLN  COMPARISON:  None.  FINDINGS: Chest wall: No chest wall mass, supraclavicular or axillary lymphadenopathy. The left thyroid lobe contains a 15 mm nodule posteriorly. Ultrasound followup is suggested. The bony thorax is intact. No acute bony findings or destructive bony changes. Advanced degenerative changes involving the thoracic spine and sternoclavicular joints.  Mediastinum: The heart is borderline in size. No pericardial effusion. The aorta is normal in caliber. Moderate atherosclerotic calcifications. No dissection. There  are three-vessel coronary artery calcifications. No mediastinal or hilar mass or adenopathy. The esophagus is grossly normal.  Pulmonary arteries: The main pulmonary artery is  mildly enlarged. The left pulmonary artery is completely occluded by clot. There is also subsegmental pulmonary emboli on the right side. Findings of right heart strain with RV LV ratio at greater than 1 (1.2).  Lungs/pleura: There is a small left pleural effusion and patchy areas of bibasilar atelectasis. No worrisome pulmonary lesions. No pulmonary edema.  Upper abdomen:  No significant findings.  Review of the MIP images confirms the above findings.  IMPRESSION: Positive for acute PE with CT evidence of right heart strain (RV/LV Ratio = 1.2) consistent with at least submassive (intermediate risk) PE. The presence of right heart strain has been associated with an increased risk of morbidity and mortality. Please activate Code PE by paging [phone].  These results were called by telephone at the time of interpretation on 04/07/2015 at 2:35 pm to Dr. Debby Freiberg , who verbally acknowledged these results.   Electronically Signed   By: Marijo Sanes M.D.   On: 04/07/2015 14:35   Dg Chest Port 1 View  04/08/2015   CLINICAL DATA:  Known pulmonary emboli. Shortness of breath. Subsequent encounter.  EXAM: PORTABLE CHEST - 1 VIEW  COMPARISON:  Chest radiograph and CTA of the chest performed 04/07/2015  FINDINGS: The lungs are well-aerated. Patchy right-sided atelectasis is again noted. A small left pleural effusion is again seen. Mild vascular congestion is noted. No pneumothorax is seen.  The cardiomediastinal silhouette is borderline enlarged. No acute osseous abnormalities are seen.  IMPRESSION: Patchy right-sided atelectasis again noted. Small left pleural effusion again seen. Mild vascular congestion and borderline cardiomegaly.   Electronically Signed   By: Garald Balding M.D.   On: 04/08/2015 06:33     EKG Interpretation   Date/Time:  Thursday April 07 2015 11:16:48 EDT Ventricular Rate:  110 PR Interval:  161 QRS Duration: 102 QT Interval:  339 QTC Calculation: 459 R Axis:   -14 Text  Interpretation:  Sinus tachycardia Abnormal R-wave progression, early  transition No significant change since last tracing Confirmed by Debby Freiberg [phone]) on 04/07/2015 11:25:43 AM       CRITICAL CARE Performed by: Debby Freiberg   Total critical care time: 35 min  Critical care time was exclusive of separately billable procedures and treating other patients.  Critical care was necessary to treat or prevent imminent or life-threatening deterioration.  Critical care was time spent personally by me on the following activities: development of treatment plan with patient and/or surrogate as well as nursing, discussions with consultants, evaluation of patient's response to treatment, examination of patient, obtaining history from patient or surrogate, ordering and performing treatments and interventions, ordering and review of laboratory studies, ordering and review of radiographic studies, pulse oximetry and re-evaluation of patient's condition.   MDM   Final diagnoses:  Cough  Pulmonary emboli    81 y.o. male with pertinent PMH of dementia, nephrolithiasis presents with chest pain.  He has been having abd pain x 1 week, seen here for same with negative wu.  Chest pain began today, as above.  History very limited by dementia, and at time of initial exam, pt had no pain.  Pt not hypoxic on room air, however persistently tachycardic.  Wu with positive trop, started on heparin for ACS, obtained CT scan with PE.  Admitted in stable condition  I have reviewed all laboratory and imaging studies if  ordered as above  1. Cough   2. Pulmonary emboli         Debby Freiberg, MD 04/08/15 [phone]

## 2015-04-07 NOTE — ED Notes (Signed)
Results given to IB and Dr. Colin Rhein

## 2015-04-07 NOTE — ED Notes (Signed)
2LNC APPLIED FOR SUPPORT ONLY

## 2015-04-07 NOTE — ED Notes (Signed)
MD at bedside. Titus Mould MD CRITICAL CARE PRESENT

## 2015-04-07 NOTE — Progress Notes (Signed)
Last admission on 6/17 at [place] for observation This admission for inpatient

## 2015-04-07 NOTE — Progress Notes (Signed)
ANTICOAGULATION CONSULT NOTE - Initial Consult  Pharmacy Consult for Heparin Indication: chest pain/ACS  Allergies  Allergen Reactions  . Donepezil     Medication listed but no reaction stated; family not sure of reaction  . Hydrocodone Nausea And Vomiting    Patient Measurements: Weight: 213 lb (96.616 kg) Heparin Dosing Weight: using TBW of 96 kg  Vital Signs: Temp: 98.7 F (37.1 C) (06/23 0912) Temp Source: Oral (06/23 0912) BP: 112/87 mmHg (06/23 0912) Pulse Rate: 124 (06/23 0920)  Labs:  Recent Labs  04/07/15 0930  HGB 11.4*  HCT 34.7*  PLT 282    Estimated Creatinine Clearance: 35.1 mL/min (by C-G formula based on Cr of 1.56).   Medical History: Past Medical History  Diagnosis Date  . Arthritis   . Diabetes mellitus   . Hypertension   . Hypercholesteremia   . Insomnia     occasional  . Nephrolithiasis     right UVJ stone  . Dementia   . Memory loss   . Headache(784.0)     slight  . Asthma     hx of as child  . PONV (postoperative nausea and vomiting)   . Complete loss of vision     right eye  . Peripheral neuropathy     both legs  . Prostate cancer      Assessment: 26 yoM with abdominal pain x 1 week presents with chest pain that started today.  Pharmacy consulted to start heparin infusion for ACS.  Baseline anticoagulation labs ordered STAT. Renal: SCr 1.49, CrCl~43 ml/min Hgb 11.4, Platelets 282  Goal of Therapy:  Heparin level 0.3-0.7 units/ml Monitor platelets by anticoagulation protocol: Yes   Plan:  Following the collection of anticoagulation labs,  1.  Give Heparin 4000 unit bolus x 1 then start infusion at 1200 units/hr. 2.  F/u baseline anticoagulation labs. 3.  Check heparin level in 8 hours. 4.  Daily HL and CBC while on heparin infusion.  Hershal Coria 04/07/2015,10:13 AM

## 2015-04-07 NOTE — Progress Notes (Signed)
ANTICOAGULATION CONSULT NOTE - Follow Up Consult  Pharmacy Consult for Heparin Indication: pulmonary embolus  Allergies  Allergen Reactions  . Donepezil     Medication listed but no reaction stated; family not sure of reaction  . Hydrocodone Nausea And Vomiting    Patient Measurements: Weight: 213 lb (96.616 kg)  Vital Signs: Temp: 99 F (37.2 C) (06/23 1612) Temp Source: Oral (06/23 1612) BP: 121/73 mmHg (06/23 1830) Pulse Rate: 102 (06/23 1830)  Labs:  Recent Labs  04/07/15 0930 04/07/15 1945  HGB 11.4*  --   HCT 34.7*  --   PLT 282  --   APTT 33  --   LABPROT 16.4*  --   INR 1.30  --   HEPARINUNFRC  --  <0.10*  CREATININE 1.49*  --     Estimated Creatinine Clearance: 36.8 mL/min (by C-G formula based on Cr of 1.49).   Medications:  Scheduled:  . docusate sodium  100 mg Oral BID  . famotidine  20 mg Oral QHS  . [START ON 04/08/2015] insulin aspart  0-15 Units Subcutaneous TID WC  . Insulin Detemir  10 Units Subcutaneous Q2200  . memantine  10 mg Oral BID  . multivitamin with minerals  1 tablet Oral Daily  . pantoprazole  40 mg Oral Daily  . [START ON 04/08/2015] simvastatin  40 mg Oral q morning - 10a  . sodium chloride  3 mL Intravenous Q12H   Infusions:  . sodium chloride    . heparin 1,200 Units/hr (04/07/15 1103)   PRN: acetaminophen **OR** acetaminophen, acetaminophen, bisacodyl, docusate sodium, magnesium citrate, ondansetron, polyethylene glycol  Assessment: 81 year old male presents with chest pain. In ED patient had elevated troponins and d-dimer, CTa positive for PE, with RV to LV ratio of 1.2, with complete left pulmonary artery occlusion, and segmental obstruction of the right pulmonary artery system. CCM was consulted, not felt to be a good candidate for thrombolysis. Patient was already on heparin drip per Pharmacy protocol out of suspicion for ACS.   First heparin level < 0.10 after 4000 unit IV bolus and 1200 units/hr infusion  No  interruptions during infusion or problems with IV pump per ED nurse; correct infusion rate verified with RN  No bleeding reported per ED nurse  Goal of Therapy:  Heparin level 0.3-0.7 units/ml Monitor platelets by anticoagulation protocol: Yes   Plan:   Rebolus heparin 2500 units IV now  Increase heparin rate to 1500 units/hr  Recheck heparin level in 8hrs  Daily heparin level and CBC  Peggyann Juba, PharmD, BCPS Pager: [phone] 04/07/2015,8:22 PM

## 2015-04-07 NOTE — Consult Note (Addendum)
PULMONARY / CRITICAL CARE MEDICINE   Name: Marvin Bishop. MRN: [id number] DOB: March 30, 1920    ADMISSION DATE:  04/07/2015 CONSULTATION DATE:  04/07/2015  REFERRING MD :  Willeen Cass  CHIEF COMPLAINT:  Chest pain  INITIAL PRESENTATION: 81 y/o male came to the [facility] emergency department on 04/07/2015 complaining of chest pain and found to have a pulmonary and was him.  STUDIES:  CT angiogram chest positive for pulmonary embolism with RV to LV ratio of 1.2; complete occlusion of left pulmonary artery with segmental obstruction in the right pulmonary arterial system   SIGNIFICANT EVENTS: 6.23 Heparin drip  HISTORY OF PRESENT ILLNESS:  81 year old male came to the [facility] emergency department on 04/07/2015 complaining of chest pain. Has a past medical history significant for hypertension, blindness, asthma. It was initially felt to have acute coronary syndrome, CT angiogram of the chest was performed because of a positive d-dimer demonstrating a pulmonary embolism with RV strain. He was started on a heparin drip in pulmonary critical care medicine was consulted. No hemoptysis, no fevers, no   PAST MEDICAL HISTORY :   has a past medical history of Arthritis; Hypertension; Hypercholesteremia; Insomnia; Nephrolithiasis; Dementia; Memory loss; Headache(784.0); Asthma; PONV (postoperative nausea and vomiting); Complete loss of vision; Peripheral neuropathy; Prostate cancer; and Diabetes mellitus.  has past surgical history that includes Appendectomy; Pars plana vitrectomy (10/24/2011); Tonsillectomy (as teen); Knee arthroscopy (Right); Hemorroidectomy; Cystoscopy w/ ureteral stent placement (Right, 10/01/2013); and Cornea removed (Right, 02-26-15). Prior to Admission medications   Medication Sig Start Date End Date Taking? Authorizing Provider  aspirin EC 81 MG tablet Take 81 mg by mouth daily.   Yes Historical Provider, MD  docusate sodium 100 MG CAPS Take 200 mg by mouth 2 (two) times  daily as needed for mild constipation. 08/25/14  Yes Thurnell Lose, MD  esomeprazole (NEXIUM) 40 MG capsule Take 1 capsule (40 mg total) by mouth 2 (two) times daily before a meal. 04/04/15  Yes Barton Dubois, MD  famotidine (PEPCID) 20 MG tablet Take 1 tablet (20 mg total) by mouth at bedtime. 04/04/15  Yes Barton Dubois, MD  furosemide (LASIX) 20 MG tablet Take 20 mg by mouth See admin instructions. Take on Mondays Wednesday and Friday   Yes Historical Provider, MD  glipiZIDE (GLUCOTROL) 10 MG tablet Take 10 mg by mouth 2 (two) times daily before a meal.    Yes Historical Provider, MD  HYDROcodone-acetaminophen (NORCO/VICODIN) 5-325 MG per tablet Take 1 tablet by mouth every 6 (six) hours as needed for moderate pain.   Yes Historical Provider, MD  LEVEMIR FLEXTOUCH 100 UNIT/ML Pen Inject 15 Units into the skin daily at 10 pm.  02/09/14  Yes Historical Provider, MD  linagliptin (TRADJENTA) 5 MG TABS tablet Take 5 mg by mouth daily.   Yes Historical Provider, MD  meloxicam (MOBIC) 7.5 MG tablet Take 7.5 mg by mouth daily as needed for pain.   Yes Historical Provider, MD  memantine (NAMENDA) 10 MG tablet Take 1 tablet (10 mg total) by mouth 2 (two) times daily. 03/17/15  Yes Dennie Bible, NP  metFORMIN (GLUCOPHAGE) 500 MG tablet Take 500 mg by mouth 2 (two) times daily with a meal.  03/16/15  Yes Historical Provider, MD  Multiple Vitamin (MULTIVITAMIN WITH MINERALS) TABS tablet Take 1 tablet by mouth daily.   Yes Historical Provider, MD  ondansetron (ZOFRAN) 4 MG tablet Take 4 mg by mouth every 8 (eight) hours as needed for nausea or vomiting.  03/08/15  Yes Historical Provider, MD  simvastatin (ZOCOR) 40 MG tablet Take 40 mg by mouth every morning.    Yes Historical Provider, MD   Allergies  Allergen Reactions  . Donepezil     Medication listed but no reaction stated; family not sure of reaction  . Hydrocodone Nausea And Vomiting    FAMILY HISTORY:  indicated that his mother is deceased. He  indicated that his father is deceased.  SOCIAL HISTORY:  reports that he quit smoking about 48 years ago. His smoking use included Cigarettes. He smoked 0.25 packs per day. He has never used smokeless tobacco. He reports that he drinks alcohol. He reports that he does not use illicit drugs.  REVIEW OF SYSTEMS:   SUBJECTIVE: No SOB, CP resolved  VITAL SIGNS: Temp:  [98 F (36.7 C)-98.7 F (37.1 C)] 98 F (36.7 C) (06/23 1320) Pulse Rate:  [103-124] 103 (06/23 1330) Resp:  [20-30] 27 (06/23 1330) BP: (105-134)/(60-88) 116/65 mmHg (06/23 1330) SpO2:  [97 %-100 %] 100 % (06/23 1330) Weight:  [96.616 kg (213 lb)] 96.616 kg (213 lb) (06/23 0957) HEMODYNAMICS:   VENTILATOR SETTINGS:   INTAKE / OUTPUT: No intake or output data in the 24 hours ending 04/07/15 1526  PHYSICAL EXAMINATION: General:  No distress, eating pork chop Neuro:  Alert, nonfocal, no eye left HEENT:  jvd wnl Cardiovascular:  s1 s2 RRR Lungs:  CTA, slight reduced left base Abdomen:  Soft, BS wnl, no r/g, distended Musculoskeletal:  Increase  Size left leg vs rt Skin:  No rash  LABS:  CBC  Recent Labs Lab 04/01/15 1530 04/02/15 0500 04/07/15 0930  WBC 10.7* 9.1 10.8*  HGB 14.0 11.8* 11.4*  HCT 41.5 35.0* 34.7*  PLT 199 167 282   Coag's  Recent Labs Lab 04/07/15 0930  APTT 33  INR 1.30   BMET  Recent Labs Lab 04/03/15 0305 04/04/15 0706 04/07/15 0930  NA 137 138 137  K 4.0 3.9 4.1  CL 107 108 105  CO2 26 23 27   BUN 15 16 17   CREATININE 1.26* 1.56* 1.49*  GLUCOSE 115* 76 77   Electrolytes  Recent Labs Lab 04/03/15 0305 04/04/15 0706 04/07/15 0930  CALCIUM 8.4* 8.2* 8.4*   Sepsis Markers  Recent Labs Lab 04/01/15 1821  LATICACIDVEN 2.44*   ABG No results for input(s): PHART, PCO2ART, PO2ART in the last 168 hours. Liver Enzymes  Recent Labs Lab 04/01/15 1530 04/07/15 0930  AST 28 36  ALT 17 25  ALKPHOS 79 69  BILITOT 1.1 1.5*  ALBUMIN 3.5 3.0*   Cardiac  Enzymes  Recent Labs Lab 04/01/15 1530  TROPONINI 0.03   Glucose  Recent Labs Lab 04/03/15 2117 04/03/15 2121 04/04/15 0738 04/04/15 0759 04/04/15 1200 04/04/15 1715  GLUCAP 95 92 61* 72 99 104*    Imaging Dg Chest 2 View  04/07/2015   CLINICAL DATA:  Left-sided chest pain and cough for 1 week  EXAM: CHEST  2 VIEW  COMPARISON:  April 01, 2015  FINDINGS: There is a nipple shadow on the right. There is no edema or consolidation. Heart size is upper normal with pulmonary vascularity within normal limits. No adenopathy. There is degenerative change in the thoracic spine.  IMPRESSION: No edema or consolidation.   Electronically Signed   By: Lowella Grip III M.D.   On: 04/07/2015 10:05   Ct Angio Chest Pe W/cm &/or Wo Cm  04/07/2015   CLINICAL DATA:  Chest pain, shortness of breath since yesterday.  EXAM: CT ANGIOGRAPHY CHEST WITH CONTRAST  TECHNIQUE: Multidetector CT imaging of the chest was performed using the standard protocol during bolus administration of intravenous contrast. Multiplanar CT image reconstructions and MIPs were obtained to evaluate the vascular anatomy.  CONTRAST:  13mL OMNIPAQUE IOHEXOL 350 MG/ML SOLN  COMPARISON:  None.  FINDINGS: Chest wall: No chest wall mass, supraclavicular or axillary lymphadenopathy. The left thyroid lobe contains a 15 mm nodule posteriorly. Ultrasound followup is suggested. The bony thorax is intact. No acute bony findings or destructive bony changes. Advanced degenerative changes involving the thoracic spine and sternoclavicular joints.  Mediastinum: The heart is borderline in size. No pericardial effusion. The aorta is normal in caliber. Moderate atherosclerotic calcifications. No dissection. There are three-vessel coronary artery calcifications. No mediastinal or hilar mass or adenopathy. The esophagus is grossly normal.  Pulmonary arteries: The main pulmonary artery is mildly enlarged. The left pulmonary artery is completely occluded by clot.  There is also subsegmental pulmonary emboli on the right side. Findings of right heart strain with RV LV ratio at greater than 1 (1.2).  Lungs/pleura: There is a small left pleural effusion and patchy areas of bibasilar atelectasis. No worrisome pulmonary lesions. No pulmonary edema.  Upper abdomen:  No significant findings.  Review of the MIP images confirms the above findings.  IMPRESSION: Positive for acute PE with CT evidence of right heart strain (RV/LV Ratio = 1.2) consistent with at least submassive (intermediate risk) PE. The presence of right heart strain has been associated with an increased risk of morbidity and mortality. Please activate Code PE by paging [phone].  These results were called by telephone at the time of interpretation on 04/07/2015 at 2:35 pm to Dr. Debby Freiberg , who verbally acknowledged these results.   Electronically Signed   By: Marijo Sanes M.D.   On: 04/07/2015 14:35     ASSESSMENT / PLAN:  PULMONARY  A: Acute pulmonary embolism with RV strain on CT angiogram (mild 1.4), hemodynamically stable, PESI Score 1 based on age and troponin positive making this at least a moderate risk pulmonary embolism; given age, chronic kidney disease and multiple comorbid illnesses he is not a good candidate for thrombolyze his snores at necessary considering the fact that he is hematologically stable P:   Check echocardiogram for correlation for CT RV fxn and PA pressures Continue heparin Admit to stepdown O2 as needed for her to saturation greater than 92% Risk of bleeding (ICH) is also present with 24 mg tpa from EKOS, at his age , risk is substantial, not to mention ratio on CT barely abnormal over 0.9 and passes eye ball test well, no distress Control CP, add tylenal Doppler legs At risk pulm infarct left base, pcxr in am , monitor temp  HEMATOLOGIC A:  PE P:  eval prostate cancer status - PSA in am  Assess homocysteine, ana Cbc in am on heparin   FAMILY  -  Updates: Family updated and pt at bedside  - Inter-disciplinary family meet or Palliative Care meeting due by: 6/23    TODAY'S SUMMARY: Heparin drip, NOT ekos candidate    Lavon Paganini. Titus Mould, MD, [place] Pgr: [place] [facility] [facility]  Pulmonary and [place] Pager: [phone]  04/07/2015, 3:26 PM

## 2015-04-07 NOTE — ED Notes (Signed)
Pt family reports pt recently admitted to same s/s of chest pain, fatigue, and diarrhea. Pt hx of dementia. Family reports pt c/o left sided chest pain since yesterday, abd pain starting this morning. 5/10 pain. Productive cough white sputum x1 week.

## 2015-04-07 NOTE — ED Notes (Signed)
rn called for pt, family delayed bring pt into triage room because they were talking with someone and giving them a phone number.

## 2015-04-08 ENCOUNTER — Inpatient Hospital Stay (HOSPITAL_COMMUNITY): Payer: Medicare PPO

## 2015-04-08 DIAGNOSIS — I2699 Other pulmonary embolism without acute cor pulmonale: Secondary | ICD-10-CM

## 2015-04-08 LAB — TROPONIN I: TROPONIN I: 0.27 ng/mL — AB (ref <0.031)

## 2015-04-08 LAB — BASIC METABOLIC PANEL
Anion gap: 7 (ref 5–15)
BUN: 15 mg/dL (ref 6–20)
CALCIUM: 8 mg/dL — AB (ref 8.9–10.3)
CO2: 24 mmol/L (ref 22–32)
Chloride: 108 mmol/L (ref 101–111)
Creatinine, Ser: 1.21 mg/dL (ref 0.61–1.24)
GFR calc Af Amer: 58 mL/min — ABNORMAL LOW (ref >60)
GFR, EST NON AFRICAN AMERICAN: 50 mL/min — AB (ref >60)
GLUCOSE: 141 mg/dL — AB (ref 65–99)
POTASSIUM: 3.5 mmol/L (ref 3.5–5.1)
Sodium: 139 mmol/L (ref 135–145)

## 2015-04-08 LAB — CBC
HEMATOCRIT: 31.6 % — AB (ref 39.0–52.0)
Hemoglobin: 10.5 g/dL — ABNORMAL LOW (ref 13.0–17.0)
MCH: 26.7 pg (ref 26.0–34.0)
MCHC: 33.2 g/dL (ref 30.0–36.0)
MCV: 80.4 fL (ref 78.0–100.0)
Platelets: 257 10*3/uL (ref 150–400)
RBC: 3.93 MIL/uL — AB (ref 4.22–5.81)
RDW: 14.3 % (ref 11.5–15.5)
WBC: 9.1 10*3/uL (ref 4.0–10.5)

## 2015-04-08 LAB — GLUCOSE, CAPILLARY
GLUCOSE-CAPILLARY: 134 mg/dL — AB (ref 65–99)
GLUCOSE-CAPILLARY: 96 mg/dL (ref 65–99)
GLUCOSE-CAPILLARY: 148 mg/dL — AB (ref 65–99)
Glucose-Capillary: 85 mg/dL (ref 65–99)
Glucose-Capillary: 249 mg/dL — ABNORMAL HIGH (ref 65–99)
Glucose-Capillary: 271 mg/dL — ABNORMAL HIGH (ref 65–99)

## 2015-04-08 LAB — HEPARIN LEVEL (UNFRACTIONATED)
HEPARIN UNFRACTIONATED: 0.36 [IU]/mL (ref 0.30–0.70)
HEPARIN UNFRACTIONATED: 0.38 [IU]/mL (ref 0.30–0.70)
Heparin Unfractionated: 0.26 IU/mL — ABNORMAL LOW (ref 0.30–0.70)

## 2015-04-08 LAB — PSA: PSA: 0.08 ng/mL (ref 0.00–4.00)

## 2015-04-08 MED ORDER — PERFLUTREN LIPID MICROSPHERE
1.0000 mL | INTRAVENOUS | Status: AC | PRN
Start: 2015-04-08 — End: 2015-04-08
  Filled 2015-04-08: qty 10

## 2015-04-08 MED ORDER — BOOST PLUS PO LIQD
237.0000 mL | Freq: Three times a day (TID) | ORAL | Status: DC
  Administered 2015-04-08 – 2015-04-10 (×5): 237 mL via ORAL
  Filled 2015-04-08 (×8): qty 237

## 2015-04-08 MED ORDER — HEPARIN (PORCINE) IN NACL 100-0.45 UNIT/ML-% IJ SOLN
1700.0000 [IU]/h | INTRAMUSCULAR | Status: AC
  Administered 2015-04-08: 1700 [IU]/h via INTRAVENOUS
  Filled 2015-04-08 (×3): qty 250

## 2015-04-08 NOTE — Progress Notes (Signed)
Initial Nutrition Assessment  DOCUMENTATION CODES:  Not applicable  INTERVENTION: - Will order Boost Plus TID, each supplement provides 360 kcal, 14 grams protein - RD will continue to monitor for needs  NUTRITION DIAGNOSIS:  Inadequate oral intake related to acute illness as evidenced by per patient/family report, meal completion < 50%.  GOAL:  Patient will meet greater than or equal to 90% of their needs  MONITOR:  PO intake, Supplement acceptance, Weight trends, Labs  REASON FOR ASSESSMENT:  Malnutrition Screening Tool  ASSESSMENT: 81 y.o. male, past medical history of dementia, diabetes mellitus, nephrolithiasis, resents with complaints of chest pain , patient is with advanced dementia, can't provide review of system or history, and was obtained from him he staff, and daughters at bedside, patient was recently hospitalized at [facility], with complaints of abdominal pain, negative workup, discharged on 04/04/15, resents today with complaints of chest pain, in ED patient had elevated troponins, as well workup was significant for elevated d-dimer.  Pt seen for MST. BMI indicates overweight status. Pt with hx of dementia and unable to provide information so all information provided by family member in the room. Unable to perform physical assessment as in-room ultrasound being completed.  Pt ate 50% of breakfast this AM; no other intakes recorded and this information provided by family member. For the past 4 weeks, following eye surgery, pt's appetite has steadily been decreasing. During this time he lost a few pounds. Family member unsure of UBW or exact amount that may have been lost. Per weight hx review, pt lost 7 lbs from 03/17/15-04/01/15 (3% body weight) which is significant for this time frame. Weight now back to weight on 6/2. Will monitor for further fluctuations.  PTA pt was drinking Boost; will order TID. Likely not fully meeting needs at this time. Medications  reviewed. Labs reviewed; Ca: 8 mg/dL, GFR: 58.  Height:  Ht Readings from Last 1 Encounters:  04/07/15 6\' 1"  (1.854 m)    Weight:  Wt Readings from Last 1 Encounters:  04/07/15 220 lb 10.9 oz (100.1 kg)    Ideal Body Weight:  83.6 kg (kg)  Wt Readings from Last 10 Encounters:  04/07/15 220 lb 10.9 oz (100.1 kg)  04/01/15 212 lb 12.8 oz (96.525 kg)  03/17/15 219 lb 6.4 oz (99.519 kg)  03/08/15 209 lb (94.802 kg)  09/14/14 209 lb (94.802 kg)  08/23/14 208 lb 12.8 oz (94.711 kg)  03/04/14 219 lb (99.338 kg)  09/29/13 217 lb (98.431 kg)  09/02/13 218 lb (98.884 kg)  08/22/13 221 lb 1.6 oz (100.29 kg)    BMI:  Body mass index is 29.12 kg/(m^2).  Estimated Nutritional Needs:  Kcal:  1500-1700  Protein:  80-90 grams  Fluid:  2.5 L/day  Skin:  Reviewed, no issues  Diet Order:  Diet heart healthy/carb modified Room service appropriate?: Yes; Fluid consistency:: Thin  EDUCATION NEEDS:  No education needs identified at this time   Intake/Output Summary (Last 24 hours) at 04/08/15 1018 Last data filed at 04/08/15 0800  Gross per 24 hour  Intake 1281.25 ml  Output   1100 ml  Net 181.25 ml    Last BM:  6/24     Jarome Matin, RD, LDN Inpatient Clinical Dietitian Pager # [phone] After hours/weekend pager # [phone]

## 2015-04-08 NOTE — Progress Notes (Signed)
ANTICOAGULATION CONSULT NOTE - Follow Up Consult  Pharmacy Consult for Heparin Indication: pulmonary embolus  Allergies  Allergen Reactions  . Donepezil     Medication listed but no reaction stated; family not sure of reaction  . Hydrocodone Nausea And Vomiting    Patient Measurements: Height: 6\' 1"  (185.4 cm) Weight: 220 lb 10.9 oz (100.1 kg) IBW/kg (Calculated) : 79.9 Heparin Dosing Weight:   Vital Signs: Temp: 98.6 F (37 C) (06/24 0328) Temp Source: Oral (06/24 0328) BP: 128/68 mmHg (06/24 0400) Pulse Rate: 99 (06/24 0400)  Labs:  Recent Labs  04/07/15 0930 04/07/15 1945 04/07/15 2110 04/08/15 0223 04/08/15 0233  HGB 11.4*  --   --   --  10.5*  HCT 34.7*  --   --   --  31.6*  PLT 282  --   --   --  257  APTT 33  --   --   --   --   LABPROT 16.4*  --   --   --   --   INR 1.30  --   --   --   --   HEPARINUNFRC  --  <0.10*  --  0.36  --   CREATININE 1.49*  --   --   --  1.21  TROPONINI  --   --  0.37*  --   --     Estimated Creatinine Clearance: 48.5 mL/min (by C-G formula based on Cr of 1.21).   Medications:  Infusions:  . sodium chloride 75 mL/hr at 04/08/15 0500  . heparin 1,500 Units/hr (04/08/15 0500)    Assessment: Patient with heparin level at goal.  No heparin issues noted.  Goal of Therapy:  Heparin level 0.3-0.7 units/ml Monitor platelets by anticoagulation protocol: Yes   Plan:  Continue heparin drip at current rate Recheck level at [place], [place] 04/08/2015,5:50 AM

## 2015-04-08 NOTE — Progress Notes (Signed)
*  Preliminary Results* Bilateral lower extremity venous duplex completed. Bilateral lower extremities are positive for deep vein thrombosis involving the right mid femoral vein, right popliteal vein, left femoral vein, left popliteal vein, left posterior tibial, and left peroneal vein. There is evidence of Baker's cyst bilaterally.  Preliminary results discussed with Dr. Waldron Labs.  04/08/2015  Maudry Mayhew, RVT, RDCS, RDMS

## 2015-04-08 NOTE — Progress Notes (Signed)
  Echocardiogram 2D Echocardiogram with Definity has been performed.  Jennette Dubin 04/08/2015, 10:50 AM

## 2015-04-08 NOTE — Progress Notes (Signed)
ANTICOAGULATION CONSULT NOTE - Follow Up Consult  Pharmacy Consult for Heparin Indication: pulmonary embolus  Allergies  Allergen Reactions  . Donepezil     Medication listed but no reaction stated; family not sure of reaction  . Hydrocodone Nausea And Vomiting    Patient Measurements: Height: 6\' 1"  (185.4 cm) Weight: 220 lb 10.9 oz (100.1 kg) IBW/kg (Calculated) : 79.9  Vital Signs: Temp: 98.6 F (37 C) (06/24 0328) Temp Source: Oral (06/24 0328) BP: 131/75 mmHg (06/24 0600) Pulse Rate: 102 (06/24 0600)  Labs:  Recent Labs  04/07/15 0930 04/07/15 1945 04/07/15 2110 04/08/15 0223 04/08/15 0233  HGB 11.4*  --   --   --  10.5*  HCT 34.7*  --   --   --  31.6*  PLT 282  --   --   --  257  APTT 33  --   --   --   --   LABPROT 16.4*  --   --   --   --   INR 1.30  --   --   --   --   HEPARINUNFRC  --  <0.10*  --  0.36  --   CREATININE 1.49*  --   --   --  1.21  TROPONINI  --   --  0.37*  --   --     Estimated Creatinine Clearance: 48.5 mL/min (by C-G formula based on Cr of 1.21).   Medications:  Infusions:  . sodium chloride 75 mL/hr at 04/08/15 0600  . heparin 1,500 Units/hr (04/08/15 0600)    Assessment: Marvin Bishop admitted 6/23 with chest pain.  PMH includes advanced dementia, DM, nephrolithiasis, and abdominal pain with recent discharge from [facility] on 04/04/15.  Troponins were elevated and pharmacy was initially consulted to dose Heparin IV for r/o ACS.  CTa was positive for PE, RV to LV ratio 1.2, complete left pulmonary artery occlusion, and segmental obstruction of the right pulmonary artery system.  CCM was consulted, not felt to be a good candidate for thrombolysis.  Heparin per pharmacy dosing was continued.  Today, 04/08/2015:  Heparin level: 0.26, decreased to subtherapeutic  CBC: Hgb 10.5 (decreased), Plt 257  No bleeding reported or documented.  No infusion problems reported.  Renal: SCr 1.21 is improved, CrCl ~ 48 ml/min   Goal of Therapy:  Heparin  level 0.3-0.7 units/ml Monitor platelets by anticoagulation protocol: Yes   Plan:   Increase to heparin IV infusion at 1700 units/hr  Heparin level 8 hours after rate change  Daily heparin level and CBC  Continue to monitor H&H and platelets  Follow up plans for long-term oral anticoagulation.  Gretta Arab PharmD, BCPS Pager [phone] 04/08/2015 8:10 AM

## 2015-04-08 NOTE — Progress Notes (Signed)
PT Cancellation Note  Patient Details Name: Marvin RUSSOM Sr. MRN: [id number] DOB: 07-Mar-1920   Cancelled Treatment:    Reason Eval/Treat Not Completed: Medical issues which prohibited therapy (not ready, on Heparin, PT protocol is  on  Hep x 24 hours at  therapeutic range. RN reports that patient was up with increased HR . will return 6/25.)   Claretha Cooper 04/08/2015, 3:37 PM

## 2015-04-08 NOTE — Progress Notes (Signed)
Patient Demographics  Marvin Bishop, is a 81 y.o. male, DOB - 1920/06/29, YQM:[id number]  Admit date - 04/07/2015   Admitting Physician [name], MD  Outpatient Primary MD for the patient is [name], MD  LOS - 1   Chief Complaint  Patient presents with  . Chest Pain  . Abdominal Pain       Admission HPI/Brief narrative: 81 year old male presents with chest pain, had elevated troponins, d-dimer, CT chest angiogram significant for submassive PE, admitted to step down.  Subjective:   Marvin Bishop today has, No headache, No chest pain, No abdominal pain - No Nausea, No new weakness tingling or numbness, No Cough - SOB.  Assessment & Plan    Principal Problem:   PE (pulmonary embolism) Active Problems:   Diabetes mellitus   Dementia without behavioral disturbance   Elevated troponin  Pulmonary embolism - Continue to monitor and stepdown, continue with heparin gtt, venous Doppler pending, 2-D echo pending, pulmonary consult appreciated, not a candidate for thrombolysis. - Plan will be to transition to NOAC, but will continue with another 24 hours on IV heparin today given the clot burden of his submassive PE. - Follow on homocystine and ANA.   Diabetes mellitus -CBGs are acceptable, continue with Levemir, and insulin sliding scale  Dementia without behavioral disturbances -Continue with Namenda  Elevated troponin - This is most likely in the setting of demand ischemia secondary to submassive pulmonary embolism, troponins are trending down 0.37 >> 0.27 will monitor on telemetry, check 2-D echo,   Code Status: Full  Family Communication: none at bedside.  Disposition Plan: home when stable.   Procedures  none   Consults   pulmonary   Medications  Scheduled Meds: . docusate sodium  100 mg Oral BID  . famotidine  20 mg Oral QHS  . insulin aspart  0-15 Units  Subcutaneous TID WC  . insulin detemir  10 Units Subcutaneous Q2200  . memantine  10 mg Oral BID  . multivitamin with minerals  1 tablet Oral Daily  . pantoprazole  80 mg Oral Daily  . simvastatin  40 mg Oral q morning - 10a  . sodium chloride  3 mL Intravenous Q12H   Continuous Infusions: . heparin 1,500 Units/hr (04/08/15 0800)   PRN Meds:.acetaminophen **OR** acetaminophen, bisacodyl, docusate sodium, ondansetron, polyethylene glycol  DVT Prophylaxis  On heparin gtt.  Lab Results  Component Value Date   PLT 257 04/08/2015    Antibiotics    Anti-infectives    None          Objective:   Filed Vitals:   04/08/15 0328 04/08/15 0400 04/08/15 0600 04/08/15 0800  BP:  128/68 131/75 132/71  Pulse:  99 102 98  Temp: 98.6 F (37 C)   98.4 F (36.9 C)  TempSrc: Oral   Oral  Resp:  27 33 21  Height:      Weight:      SpO2:  100% 100% 100%    Wt Readings from Last 3 Encounters:  04/07/15 100.1 kg (220 lb 10.9 oz)  04/01/15 96.525 kg (212 lb 12.8 oz)  03/17/15 99.519 kg (219 lb 6.4 oz)     Intake/Output Summary (Last 24 hours) at 04/08/15 0935 Last data filed  at 04/08/15 0800  Gross per 24 hour  Intake 1281.25 ml  Output   1100 ml  Net 181.25 ml     Physical Exam  Awake Alert, Oriented X 2, No new F.N deficits, Normal affect [place].AT, right eye blindness Supple Neck,No JVD, No cervical lymphadenopathy appriciated.  Symmetrical Chest wall movement, Good air movement bilaterally,  RRR,No Gallops,Rubs or new Murmurs, No Parasternal Heave +ve B.Sounds, Abd Soft, No tenderness, No organomegaly appriciated, No rebound - guarding or rigidity. No Cyanosis, Clubbing , No new Rash or bruise  , lower extremity edema left > right   Data Review   Micro Results Recent Results (from the past 240 hour(s))  MRSA PCR Screening     Status: None   Collection Time: 04/07/15  8:25 PM  Result Value Ref Range Status   MRSA by PCR NEGATIVE NEGATIVE Final    Comment:          The GeneXpert MRSA Assay (FDA approved for NASAL specimens only), is one component of a comprehensive MRSA colonization surveillance program. It is not intended to diagnose MRSA infection nor to guide or monitor treatment for MRSA infections.     Radiology Reports Dg Chest 2 View  04/07/2015   CLINICAL DATA:  Left-sided chest pain and cough for 1 week  EXAM: CHEST  2 VIEW  COMPARISON:  April 01, 2015  FINDINGS: There is a nipple shadow on the right. There is no edema or consolidation. Heart size is upper normal with pulmonary vascularity within normal limits. No adenopathy. There is degenerative change in the thoracic spine.  IMPRESSION: No edema or consolidation.   Electronically Signed   By: Lowella Grip III M.D.   On: 04/07/2015 10:05   Ct Angio Chest Pe W/cm &/or Wo Cm  04/07/2015   CLINICAL DATA:  Chest pain, shortness of breath since yesterday.  EXAM: CT ANGIOGRAPHY CHEST WITH CONTRAST  TECHNIQUE: Multidetector CT imaging of the chest was performed using the standard protocol during bolus administration of intravenous contrast. Multiplanar CT image reconstructions and MIPs were obtained to evaluate the vascular anatomy.  CONTRAST:  161mL OMNIPAQUE IOHEXOL 350 MG/ML SOLN  COMPARISON:  None.  FINDINGS: Chest wall: No chest wall mass, supraclavicular or axillary lymphadenopathy. The left thyroid lobe contains a 15 mm nodule posteriorly. Ultrasound followup is suggested. The bony thorax is intact. No acute bony findings or destructive bony changes. Advanced degenerative changes involving the thoracic spine and sternoclavicular joints.  Mediastinum: The heart is borderline in size. No pericardial effusion. The aorta is normal in caliber. Moderate atherosclerotic calcifications. No dissection. There are three-vessel coronary artery calcifications. No mediastinal or hilar mass or adenopathy. The esophagus is grossly normal.  Pulmonary arteries: The main pulmonary artery is mildly enlarged. The  left pulmonary artery is completely occluded by clot. There is also subsegmental pulmonary emboli on the right side. Findings of right heart strain with RV LV ratio at greater than 1 (1.2).  Lungs/pleura: There is a small left pleural effusion and patchy areas of bibasilar atelectasis. No worrisome pulmonary lesions. No pulmonary edema.  Upper abdomen:  No significant findings.  Review of the MIP images confirms the above findings.  IMPRESSION: Positive for acute PE with CT evidence of right heart strain (RV/LV Ratio = 1.2) consistent with at least submassive (intermediate risk) PE. The presence of right heart strain has been associated with an increased risk of morbidity and mortality. Please activate Code PE by paging [phone].  These results were called by telephone  at the time of interpretation on 04/07/2015 at 2:35 pm to Dr. Debby Freiberg , who verbally acknowledged these results.   Electronically Signed   By: Marijo Sanes M.D.   On: 04/07/2015 14:35   Dg Chest Port 1 View  04/08/2015   CLINICAL DATA:  Known pulmonary emboli. Shortness of breath. Subsequent encounter.  EXAM: PORTABLE CHEST - 1 VIEW  COMPARISON:  Chest radiograph and CTA of the chest performed 04/07/2015  FINDINGS: The lungs are well-aerated. Patchy right-sided atelectasis is again noted. A small left pleural effusion is again seen. Mild vascular congestion is noted. No pneumothorax is seen.  The cardiomediastinal silhouette is borderline enlarged. No acute osseous abnormalities are seen.  IMPRESSION: Patchy right-sided atelectasis again noted. Small left pleural effusion again seen. Mild vascular congestion and borderline cardiomegaly.   Electronically Signed   By: Garald Balding M.D.   On: 04/08/2015 06:33   Dg Chest Portable 1 View  04/01/2015   CLINICAL DATA:  Shortness of breath, weakness  EXAM: PORTABLE CHEST - 1 VIEW  COMPARISON:  08/23/2014  FINDINGS: Cardiac leads obscure detail. Right AC joint degenerative change. Heart size  is mildly enlarged without evidence for edema. No focal pulmonary opacity. No pleural effusion.  IMPRESSION: Mild cardiomegaly without focal acute finding.   Electronically Signed   By: Conchita Paris M.D.   On: 04/01/2015 18:39   Ct Renal Stone Study  04/01/2015   CLINICAL DATA:  81 year old male with nausea abdominal pain, abdominal distention, shortness of breath, weakness. Initial encounter.  EXAM: CT ABDOMEN AND PELVIS WITHOUT CONTRAST  TECHNIQUE: Multidetector CT imaging of the abdomen and pelvis was performed following the standard protocol without IV contrast.  COMPARISON:  CT Abdomen and Pelvis 08/23/2014.  FINDINGS: Mild motion artifact at the lung bases. Stable cardiomegaly. No pericardial effusion. Stable scarring or atelectasis in the right costophrenic angle.  Advanced degenerative changes in the spine with bulky osteophytosis. Multilevel lumbar spinal stenosis. No acute osseous abnormality identified.  Sequelae of prostatectomy. No pelvic free fluid. Diminutive bladder. Negative rectum. Decompressed small bowel loops in the pelvis.  Diverticulosis of the sigmoid colon with no active inflammation identified. Negative left colon aside from retained stool. Redundant transverse colon with retained stool. Negative right colon. Normal appendix. Negative terminal ileum. No dilated small bowel small hiatal hernia. Decompressed stomach. Negative duodenum.  Noncontrast liver with chronic left lateral lobe hemangioma which is better demonstrated on the prior contrast-enhanced study. Negative noncontrast CT appearance of the gallbladder. Noncontrast spleen, pancreas and adrenal glands are within normal limits. No hydronephrosis. Chronic left renal cysts. No nephrolithiasis. No hydroureter or ureteral calculus. No abdominal free fluid. Aortoiliac calcified atherosclerosis noted. No lymphadenopathy identified. Small fat containing inguinal hernias are stable.  IMPRESSION: 1. No urologic calculi. No acute or  inflammatory process identified in the abdomen or pelvis. 2. Chronic findings including sigmoid diverticulosis, Aortoiliac calcified atherosclerosis, Advanced spine degeneration with lumbar spinal stenosis.   Electronically Signed   By: Genevie Ann M.D.   On: 04/01/2015 18:56     CBC  Recent Labs Lab 04/01/15 1530 04/02/15 0500 04/07/15 0930 04/08/15 0233  WBC 10.7* 9.1 10.8* 9.1  HGB 14.0 11.8* 11.4* 10.5*  HCT 41.5 35.0* 34.7* 31.6*  PLT 199 167 282 257  MCV 82.2 81.2 82.2 80.4  MCH 27.7 27.4 27.0 26.7  MCHC 33.7 33.7 32.9 33.2  RDW 14.5 14.3 14.5 14.3  LYMPHSABS 1.3  --  0.9  --   MONOABS 1.4*  --  1.5*  --  EOSABS 0.2  --  0.2  --   BASOSABS 0.0  --  0.0  --     Chemistries   Recent Labs Lab 04/01/15 1530 04/02/15 0500 04/03/15 0305 04/04/15 0706 04/07/15 0930 04/08/15 0233  NA 133* 138 137 138 137 139  K 4.7 3.8 4.0 3.9 4.1 3.5  CL 100* 104 107 108 105 108  CO2 26 26 26 23 27 24   GLUCOSE 222* 67 115* 76 77 141*  BUN 20 17 15 16 17 15   CREATININE 1.52* 1.38* 1.26* 1.56* 1.49* 1.21  CALCIUM 9.5 8.8* 8.4* 8.2* 8.4* 8.0*  AST 28  --   --   --  36  --   ALT 17  --   --   --  25  --   ALKPHOS 79  --   --   --  69  --   BILITOT 1.1  --   --   --  1.5*  --    ------------------------------------------------------------------------------------------------------------------ estimated creatinine clearance is 48.5 mL/min (by C-G formula based on Cr of 1.21). ------------------------------------------------------------------------------------------------------------------ No results for input(s): HGBA1C in the last 72 hours. ------------------------------------------------------------------------------------------------------------------ No results for input(s): CHOL, HDL, LDLCALC, TRIG, CHOLHDL, LDLDIRECT in the last 72 hours. ------------------------------------------------------------------------------------------------------------------ No results for input(s): TSH,  T4TOTAL, T3FREE, THYROIDAB in the last 72 hours.  Invalid input(s): FREET3 ------------------------------------------------------------------------------------------------------------------ No results for input(s): VITAMINB12, FOLATE, FERRITIN, TIBC, IRON, RETICCTPCT in the last 72 hours.  Coagulation profile  Recent Labs Lab 04/07/15 0930  INR 1.30     Recent Labs  04/07/15 0930  DDIMER 11.44*    Cardiac Enzymes  Recent Labs Lab 04/01/15 1530 04/07/15 2110 04/08/15 0830  TROPONINI 0.03 0.37* 0.27*   ------------------------------------------------------------------------------------------------------------------ Invalid input(s): POCBNP     Time Spent in minutes  30 minutes   Baley Lorimer M.D on 04/08/2015 at 9:35 AM  Between 7am to 7pm - Pager - [phone]  After 7pm go to [email or web address] - password [facility]  [facility]   Office  [phone]

## 2015-04-08 NOTE — Progress Notes (Signed)
ANTICOAGULATION CONSULT NOTE - Follow Up Consult  Pharmacy Consult for Heparin Indication: pulmonary embolus  Allergies  Allergen Reactions  . Donepezil     Medication listed but no reaction stated; family not sure of reaction  . Hydrocodone Nausea And Vomiting    Patient Measurements: Height: 6\' 1"  (185.4 cm) Weight: 220 lb 10.9 oz (100.1 kg) IBW/kg (Calculated) : 79.9  Vital Signs: Temp: 99.3 F (37.4 C) (06/24 1600) Temp Source: Oral (06/24 1600) BP: 146/66 mmHg (06/24 1800) Pulse Rate: 118 (06/24 1800)  Labs:  Recent Labs  04/07/15 0930  04/07/15 2110 04/08/15 0223 04/08/15 0233 04/08/15 0830 04/08/15 1015 04/08/15 1922  HGB 11.4*  --   --   --  10.5*  --   --   --   HCT 34.7*  --   --   --  31.6*  --   --   --   PLT 282  --   --   --  257  --   --   --   APTT 33  --   --   --   --   --   --   --   LABPROT 16.4*  --   --   --   --   --   --   --   INR 1.30  --   --   --   --   --   --   --   HEPARINUNFRC  --   < >  --  0.36  --   --  0.26* 0.38  CREATININE 1.49*  --   --   --  1.21  --   --   --   TROPONINI  --   --  0.37*  --   --  0.27*  --   --   < > = values in this interval not displayed.  Estimated Creatinine Clearance: 48.5 mL/min (by C-G formula based on Cr of 1.21).   Medications:  Infusions:  . heparin 1,700 Units/hr (04/08/15 1800)    Assessment: 89 yoM admitted 6/23 with chest pain.  PMH includes advanced dementia, DM, nephrolithiasis, and abdominal pain with recent discharge from [facility] on 04/04/15.  Troponins were elevated and pharmacy was initially consulted to dose Heparin IV for r/o ACS.  CTa was positive for PE, RV to LV ratio 1.2, complete left pulmonary artery occlusion, and segmental obstruction of the right pulmonary artery system.  CCM was consulted, not felt to be a good candidate for thrombolysis.  Heparin per pharmacy dosing was continued.  Today, 04/08/2015:  Heparin level therapeutic after rate increased this AM  No bleeding  reported or documented.  No infusion problems reported.  Goal of Therapy:  Heparin level 0.3-0.7 units/ml Monitor platelets by anticoagulation protocol: Yes   Plan:  1) Continue IV heparin at current rate of 1700 units/hr 2) Recheck heparin level with AM labs  Adrian Saran, PharmD, BCPS Pager [phone] 04/08/2015 7:43 PM

## 2015-04-08 NOTE — Progress Notes (Signed)
No distress on RA Hemodynamically stable  Filed Vitals:   04/08/15 0328 04/08/15 0400 04/08/15 0600 04/08/15 0800  BP:  128/68 131/75 132/71  Pulse:  99 102 98  Temp: 98.6 F (37 C)   98.4 F (36.9 C)  TempSrc: Oral   Oral  Resp:  27 33 21  Height:      Weight:      SpO2:  100% 100% 100%   NAD Neuro nonfocal HEENT WNL RRR s M Chest clear NABS L>R LE edema  BMET    Component Value Date/Time   NA 139 04/08/2015 0233   K 3.5 04/08/2015 0233   CL 108 04/08/2015 0233   CO2 24 04/08/2015 0233   GLUCOSE 141* 04/08/2015 0233   BUN 15 04/08/2015 0233   CREATININE 1.21 04/08/2015 0233   CALCIUM 8.0* 04/08/2015 0233   GFRNONAA 50* 04/08/2015 0233   GFRAA 58* 04/08/2015 0233    CBC    Component Value Date/Time   WBC 9.1 04/08/2015 0233   RBC 3.93* 04/08/2015 0233   HGB 10.5* 04/08/2015 0233   HCT 31.6* 04/08/2015 0233   PLT 257 04/08/2015 0233   MCV 80.4 04/08/2015 0233   MCH 26.7 04/08/2015 0233   MCHC 33.2 04/08/2015 0233   RDW 14.3 04/08/2015 0233   LYMPHSABS 0.9 04/07/2015 0930   MONOABS 1.5* 04/07/2015 0930   EOSABS 0.2 04/07/2015 0930   BASOSABS 0.0 04/07/2015 0930   IMPRESSION: Acute PE - likely risk factors are prostate cancer and immobility  PLAN/REC: Cont anticoagulation Recommend lifelong anticoagulation given likely indefinite persistence of risk factors  Warfarin or NOAC Appears stable for transfer to telemetry PCCM will sign off. Please call if we can be of further assistance  Merton Border, MD ; [facility] [phone].  After 5:30 PM or weekends, call [phone]

## 2015-04-09 LAB — GLUCOSE, CAPILLARY
GLUCOSE-CAPILLARY: 163 mg/dL — AB (ref 65–99)
GLUCOSE-CAPILLARY: 110 mg/dL — AB (ref 65–99)
GLUCOSE-CAPILLARY: 117 mg/dL — AB (ref 65–99)
Glucose-Capillary: 102 mg/dL — ABNORMAL HIGH (ref 65–99)

## 2015-04-09 LAB — CBC
HCT: 32.8 % — ABNORMAL LOW (ref 39.0–52.0)
Hemoglobin: 10.9 g/dL — ABNORMAL LOW (ref 13.0–17.0)
MCH: 26.9 pg (ref 26.0–34.0)
MCHC: 33.2 g/dL (ref 30.0–36.0)
MCV: 81 fL (ref 78.0–100.0)
Platelets: 307 10*3/uL (ref 150–400)
RBC: 4.05 MIL/uL — ABNORMAL LOW (ref 4.22–5.81)
RDW: 14.4 % (ref 11.5–15.5)
WBC: 11.4 10*3/uL — AB (ref 4.0–10.5)

## 2015-04-09 LAB — BASIC METABOLIC PANEL
Anion gap: 4 — ABNORMAL LOW (ref 5–15)
BUN: 14 mg/dL (ref 6–20)
CALCIUM: 8.1 mg/dL — AB (ref 8.9–10.3)
CO2: 26 mmol/L (ref 22–32)
CREATININE: 1.23 mg/dL (ref 0.61–1.24)
Chloride: 107 mmol/L (ref 101–111)
GFR calc non Af Amer: 49 mL/min — ABNORMAL LOW (ref >60)
GFR, EST AFRICAN AMERICAN: 57 mL/min — AB (ref >60)
GLUCOSE: 126 mg/dL — AB (ref 65–99)
POTASSIUM: 3.6 mmol/L (ref 3.5–5.1)
Sodium: 137 mmol/L (ref 135–145)

## 2015-04-09 LAB — HEPARIN LEVEL (UNFRACTIONATED): Heparin Unfractionated: 0.47 IU/mL (ref 0.30–0.70)

## 2015-04-09 MED ORDER — APIXABAN 5 MG PO TABS
10.0000 mg | ORAL_TABLET | Freq: Two times a day (BID) | ORAL | Status: DC
  Administered 2015-04-09 – 2015-04-10 (×3): 10 mg via ORAL
  Filled 2015-04-09 (×5): qty 2

## 2015-04-09 MED ORDER — APIXABAN 5 MG PO TABS: 5.0000 mg | ORAL_TABLET | Freq: Two times a day (BID) | ORAL | Status: DC

## 2015-04-09 NOTE — Clinical Social Work Note (Signed)
Clinical Social Work Assessment  Patient Details  Name: Marvin SPIELER Sr. MRN: [id number] Date of Birth: 11/15/1919  Date of referral:  04/09/15               Reason for consult:  Facility Placement                Permission sought to share information with:  Facility Sport and exercise psychologist, Family Supports Permission granted to share information::  Yes, Verbal Permission Granted  Name::        Agency::     Relationship::     Contact Information:     Housing/Transportation Living arrangements for the past 2 months:  Single Family Home Source of Information:  Adult Children Patient Interpreter Needed:    Criminal Activity/Legal Involvement Pertinent to Current Situation/Hospitalization:    Significant Relationships:  Adult Children Lives with:  Self Do you feel safe going back to the place where you live?    Need for family participation in patient care:  Yes (Comment)  Care giving concerns:  None reported   Facilities manager / plan:  CSW met with pt at bedside to discuss discharge needs.  CSW introduced herself and explained role of CSW CSW provided explanation of SNF process and prompted pt to discuss his thoughts on rehab.  CSW called and spoke with pt's daughter [name] and provided her with SNF process.  CSW encourage pt's daughter to discuss pt history, needs and feelings regarding rehab.  CSW provided active and supportive listening and will send pt information to SNF's in [place] area.  Employment status:  Retired Forensic scientist:  Managed Care PT Recommendations:  [place] / Referral to community resources:     Patient/Family's Response to care:  Pt pleasant and cooperative but stated that he leaves the decisions up to his daughters requesting that CSW call [place]. Pt's daughter stated that pt lives alone but that she and her sister help care for him and his dog. Pt's daughter asked questions about the PT recommendation.  Pt's  daughter asked for clarity regarding the SNF facilities and process for information.  Pt's daughter wants first choice of [place], [place].  Patient/Family's Understanding of and Emotional Response to Diagnosis, Current Treatment, and Prognosis:  Pt appeared relaxed at allowing his daughters to make decisions for his rehab needs.  Pt's daughter appreciative of the help and for providing clarity with regards to services.   Emotional Assessment Appearance:  Appears younger than stated age Attitude/Demeanor/Rapport:  Lethargic Affect (typically observed):  Accepting Orientation:  Oriented to Self, Oriented to Place, Oriented to  Time, Oriented to Situation Alcohol / Substance use:    Psych involvement (Current and /or in the community):  No (Comment)  Discharge Needs  Concerns to be addressed:    Readmission within the last 30 days:    Current discharge risk:    Barriers to Discharge:  No Barriers Identified   Carlean Jews, LCSW 04/09/2015, 3:28 PM

## 2015-04-09 NOTE — Progress Notes (Signed)
ANTICOAGULATION CONSULT NOTE - Follow Up Consult  Pharmacy Consult for Heparin Indication: pulmonary embolus  Allergies  Allergen Reactions  . Donepezil     Medication listed but no reaction stated; family not sure of reaction  . Hydrocodone Nausea And Vomiting    Patient Measurements: Height: 6\' 1"  (185.4 cm) Weight: 220 lb 10.9 oz (100.1 kg) IBW/kg (Calculated) : 79.9 Heparin Dosing Weight:   Vital Signs: Temp: 98.2 F (36.8 C) (06/25 0324) Temp Source: Oral (06/25 0324) BP: 141/107 mmHg (06/25 0400) Pulse Rate: 110 (06/25 0400)  Labs:  Recent Labs  04/07/15 0930  04/07/15 2110  04/08/15 0233 04/08/15 0830 04/08/15 1015 04/08/15 1922 04/09/15 0350  HGB 11.4*  --   --   --  10.5*  --   --   --  10.9*  HCT 34.7*  --   --   --  31.6*  --   --   --  32.8*  PLT 282  --   --   --  257  --   --   --  307  APTT 33  --   --   --   --   --   --   --   --   LABPROT 16.4*  --   --   --   --   --   --   --   --   INR 1.30  --   --   --   --   --   --   --   --   HEPARINUNFRC  --   < >  --   < >  --   --  0.26* 0.38 0.47  CREATININE 1.49*  --   --   --  1.21  --   --   --  1.23  TROPONINI  --   --  0.37*  --   --  0.27*  --   --   --   < > = values in this interval not displayed.  Estimated Creatinine Clearance: 47.7 mL/min (by C-G formula based on Cr of 1.23).   Medications:  Infusions:  . heparin 1,700 Units/hr (04/09/15 0500)    Assessment: Patient with heparin level at goal.  No heparin issues noted.  Goal of Therapy:  Heparin level 0.3-0.7 units/ml Monitor platelets by anticoagulation protocol: Yes   Plan:  Continue heparin drip at current rate Recheck level with AM labs  Tyler Deis, Shea Stakes [place] 04/09/2015,5:22 AM

## 2015-04-09 NOTE — Discharge Instructions (Signed)
Information on my medicine - ELIQUIS (apixaban)  This medication education was reviewed with me or my healthcare representative as part of my discharge preparation.  The pharmacist that spoke with me during my hospital stay was: Altha Harm   Why was Eliquis prescribed for you? Eliquis was prescribed to treat blood clots that may have been found in the veins of your legs (deep vein thrombosis) or in your lungs (pulmonary embolism) and to reduce the risk of them occurring again.  What do You need to know about Eliquis ? The starting dose is 10 mg (two 5 mg tablets) taken TWICE daily for the FIRST SEVEN (7) DAYS, then on (enter date)  04/16/15  the dose is reduced to ONE 5 mg tablet taken TWICE daily.  Eliquis may be taken with or without food.   Try to take the dose about the same time in the morning and in the evening. If you have difficulty swallowing the tablet whole please discuss with your pharmacist how to take the medication safely.  Take Eliquis exactly as prescribed and DO NOT stop taking Eliquis without talking to the doctor who prescribed the medication.  Stopping may increase your risk of developing a new blood clot.  Refill your prescription before you run out.  After discharge, you should have regular check-up appointments with your healthcare provider that is prescribing your Eliquis.    What do you do if you miss a dose? If a dose of ELIQUIS is not taken at the scheduled time, take it as soon as possible on the same day and twice-daily administration should be resumed. The dose should not be doubled to make up for a missed dose.  Important Safety Information A possible side effect of Eliquis is bleeding. You should call your healthcare provider right away if you experience any of the following: ? Bleeding from an injury or your nose that does not stop. ? Unusual colored urine (red or dark brown) or unusual colored stools (red or black). ? Unusual bruising for unknown  reasons. ? A serious fall or if you hit your head (even if there is no bleeding).  Some medicines may interact with Eliquis and might increase your risk of bleeding or clotting while on Eliquis. To help avoid this, consult your healthcare provider or pharmacist prior to using any new prescription or non-prescription medications, including herbals, vitamins, non-steroidal anti-inflammatory drugs (NSAIDs) and supplements.  This website has more information on Eliquis (apixaban): http://www.eliquis.com/eliquis/home

## 2015-04-09 NOTE — Progress Notes (Signed)
Patient Demographics  Marvin Bishop, is a 81 y.o. male, DOB - [birth year], TGY:[id number]  Admit date - 04/07/2015   Admitting Physician [name], MD  Outpatient Primary MD for the patient is [name], MD  LOS - 2   Chief Complaint  Patient presents with  . Chest Pain  . Abdominal Pain       Admission HPI/Brief narrative: 81 year old male presents with chest pain, had elevated troponins, d-dimer, CT chest angiogram significant for submassive PE, admitted to step down.  Subjective:   Marvin Bishop today has, No headache, No chest pain, No abdominal pain - No Nausea, No new weakness tingling or numbness, No Cough - SOB.  Assessment & Plan    Principal Problem:   PE (pulmonary embolism) Active Problems:   Diabetes mellitus   Dementia without behavioral disturbance   Elevated troponin  Pulmonary embolism -  Treated initially with heparin GTT , 2-D echo showing no evidence of right ventricular strain,venous Doppler significant for bilateral PE,  pulmonary consult appreciated, not a candidate for thrombolysis, patient transition to a liquid 6/25 . - Follow on homocystine and ANA.   Diabetes mellitus -CBGs are acceptable, continue with Levemir, and insulin sliding scale  Dementia without behavioral disturbances -Continue with Namenda  Elevated troponin - This is most likely in the setting of demand ischemia secondary to submassive pulmonary embolism, troponins are trending down 0.37 >> 0.27 will monitor on telemetry, check 2-D echo,   Code Status: Full  Family Communication: spoke with daughter on 6/24  Disposition Plan: transfer to med floor, SNF when bed available.   Procedures  none   Consults   pulmonary   Medications  Scheduled Meds: . apixaban  10 mg Oral BID   Followed by  . [START ON 04/16/2015] apixaban  5 mg Oral BID  . docusate sodium  100 mg Oral BID    . famotidine  20 mg Oral QHS  . insulin aspart  0-15 Units Subcutaneous TID WC  . insulin detemir  10 Units Subcutaneous Q2200  . lactose free nutrition  237 mL Oral TID WC  . memantine  10 mg Oral BID  . multivitamin with minerals  1 tablet Oral Daily  . pantoprazole  80 mg Oral Daily  . simvastatin  40 mg Oral q morning - 10a  . sodium chloride  3 mL Intravenous Q12H   Continuous Infusions:   PRN Meds:.acetaminophen **OR** acetaminophen, bisacodyl, docusate sodium, ondansetron, polyethylene glycol  DVT Prophylaxis  On heparin gtt.  Lab Results  Component Value Date   PLT 307 04/09/2015    Antibiotics    Anti-infectives    None          Objective:   Filed Vitals:   04/09/15 0400 04/09/15 0755 04/09/15 0800 04/09/15 1217  BP: 141/107  135/71 142/77  Pulse: 110  98 111  Temp:  98.5 F (36.9 C)    TempSrc:  Oral    Resp: 20  39 32  Height:      Weight:      SpO2: 99%  99% 97%    Wt Readings from Last 3 Encounters:  04/07/15 100.1 kg (220 lb 10.9 oz)  04/01/15 96.525 kg (212 lb 12.8 oz)  03/17/15 99.519  kg (219 lb 6.4 oz)     Intake/Output Summary (Last 24 hours) at 04/09/15 1239 Last data filed at 04/09/15 0800  Gross per 24 hour  Intake    600 ml  Output   1200 ml  Net   -600 ml     Physical Exam  Awake Alert, Oriented X 2, No new F.N deficits, Normal affect Fuig.AT, right eye blindness Supple Neck,No JVD, No cervical lymphadenopathy appriciated.  Symmetrical Chest wall movement, Good air movement bilaterally,  RRR,No Gallops,Rubs or new Murmurs, No Parasternal Heave +ve B.Sounds, Abd Soft, No tenderness, No organomegaly appriciated, No rebound - guarding or rigidity. No Cyanosis, Clubbing , No new Rash or bruise  , lower extremity edema left > right   Data Review   Micro Results Recent Results (from the past 240 hour(s))  MRSA PCR Screening     Status: None   Collection Time: 04/07/15  8:25 PM  Result Value Ref Range Status   MRSA by PCR  NEGATIVE NEGATIVE Final    Comment:        The GeneXpert MRSA Assay (FDA approved for NASAL specimens only), is one component of a comprehensive MRSA colonization surveillance program. It is not intended to diagnose MRSA infection nor to guide or monitor treatment for MRSA infections.     Radiology Reports Dg Chest 2 View  04/07/2015   CLINICAL DATA:  Left-sided chest pain and cough for 1 week  EXAM: CHEST  2 VIEW  COMPARISON:  April 01, 2015  FINDINGS: There is a nipple shadow on the right. There is no edema or consolidation. Heart size is upper normal with pulmonary vascularity within normal limits. No adenopathy. There is degenerative change in the thoracic spine.  IMPRESSION: No edema or consolidation.   Electronically Signed   By: Lowella Grip III M.D.   On: 04/07/2015 10:05   Ct Angio Chest Pe W/cm &/or Wo Cm  04/07/2015   CLINICAL DATA:  Chest pain, shortness of breath since yesterday.  EXAM: CT ANGIOGRAPHY CHEST WITH CONTRAST  TECHNIQUE: Multidetector CT imaging of the chest was performed using the standard protocol during bolus administration of intravenous contrast. Multiplanar CT image reconstructions and MIPs were obtained to evaluate the vascular anatomy.  CONTRAST:  16mL OMNIPAQUE IOHEXOL 350 MG/ML SOLN  COMPARISON:  None.  FINDINGS: Chest wall: No chest wall mass, supraclavicular or axillary lymphadenopathy. The left thyroid lobe contains a 15 mm nodule posteriorly. Ultrasound followup is suggested. The bony thorax is intact. No acute bony findings or destructive bony changes. Advanced degenerative changes involving the thoracic spine and sternoclavicular joints.  Mediastinum: The heart is borderline in size. No pericardial effusion. The aorta is normal in caliber. Moderate atherosclerotic calcifications. No dissection. There are three-vessel coronary artery calcifications. No mediastinal or hilar mass or adenopathy. The esophagus is grossly normal.  Pulmonary arteries: The  main pulmonary artery is mildly enlarged. The left pulmonary artery is completely occluded by clot. There is also subsegmental pulmonary emboli on the right side. Findings of right heart strain with RV LV ratio at greater than 1 (1.2).  Lungs/pleura: There is a small left pleural effusion and patchy areas of bibasilar atelectasis. No worrisome pulmonary lesions. No pulmonary edema.  Upper abdomen:  No significant findings.  Review of the MIP images confirms the above findings.  IMPRESSION: Positive for acute PE with CT evidence of right heart strain (RV/LV Ratio = 1.2) consistent with at least submassive (intermediate risk) PE. The presence of right heart strain has  been associated with an increased risk of morbidity and mortality. Please activate Code PE by paging [phone].  These results were called by telephone at the time of interpretation on 04/07/2015 at 2:35 pm to Dr. Debby Freiberg , who verbally acknowledged these results.   Electronically Signed   By: Marijo Sanes M.D.   On: 04/07/2015 14:35   Dg Chest Port 1 View  04/08/2015   CLINICAL DATA:  Known pulmonary emboli. Shortness of breath. Subsequent encounter.  EXAM: PORTABLE CHEST - 1 VIEW  COMPARISON:  Chest radiograph and CTA of the chest performed 04/07/2015  FINDINGS: The lungs are well-aerated. Patchy right-sided atelectasis is again noted. A small left pleural effusion is again seen. Mild vascular congestion is noted. No pneumothorax is seen.  The cardiomediastinal silhouette is borderline enlarged. No acute osseous abnormalities are seen.  IMPRESSION: Patchy right-sided atelectasis again noted. Small left pleural effusion again seen. Mild vascular congestion and borderline cardiomegaly.   Electronically Signed   By: Garald Balding M.D.   On: 04/08/2015 06:33   Dg Chest Portable 1 View  04/01/2015   CLINICAL DATA:  Shortness of breath, weakness  EXAM: PORTABLE CHEST - 1 VIEW  COMPARISON:  08/23/2014  FINDINGS: Cardiac leads obscure detail.  Right AC joint degenerative change. Heart size is mildly enlarged without evidence for edema. No focal pulmonary opacity. No pleural effusion.  IMPRESSION: Mild cardiomegaly without focal acute finding.   Electronically Signed   By: Conchita Paris M.D.   On: 04/01/2015 18:39   Ct Renal Stone Study  04/01/2015   CLINICAL DATA:  81 year old male with nausea abdominal pain, abdominal distention, shortness of breath, weakness. Initial encounter.  EXAM: CT ABDOMEN AND PELVIS WITHOUT CONTRAST  TECHNIQUE: Multidetector CT imaging of the abdomen and pelvis was performed following the standard protocol without IV contrast.  COMPARISON:  CT Abdomen and Pelvis 08/23/2014.  FINDINGS: Mild motion artifact at the lung bases. Stable cardiomegaly. No pericardial effusion. Stable scarring or atelectasis in the right costophrenic angle.  Advanced degenerative changes in the spine with bulky osteophytosis. Multilevel lumbar spinal stenosis. No acute osseous abnormality identified.  Sequelae of prostatectomy. No pelvic free fluid. Diminutive bladder. Negative rectum. Decompressed small bowel loops in the pelvis.  Diverticulosis of the sigmoid colon with no active inflammation identified. Negative left colon aside from retained stool. Redundant transverse colon with retained stool. Negative right colon. Normal appendix. Negative terminal ileum. No dilated small bowel small hiatal hernia. Decompressed stomach. Negative duodenum.  Noncontrast liver with chronic left lateral lobe hemangioma which is better demonstrated on the prior contrast-enhanced study. Negative noncontrast CT appearance of the gallbladder. Noncontrast spleen, pancreas and adrenal glands are within normal limits. No hydronephrosis. Chronic left renal cysts. No nephrolithiasis. No hydroureter or ureteral calculus. No abdominal free fluid. Aortoiliac calcified atherosclerosis noted. No lymphadenopathy identified. Small fat containing inguinal hernias are stable.   IMPRESSION: 1. No urologic calculi. No acute or inflammatory process identified in the abdomen or pelvis. 2. Chronic findings including sigmoid diverticulosis, Aortoiliac calcified atherosclerosis, Advanced spine degeneration with lumbar spinal stenosis.   Electronically Signed   By: Genevie Ann M.D.   On: 04/01/2015 18:56     CBC  Recent Labs Lab 04/07/15 0930 04/08/15 0233 04/09/15 0350  WBC 10.8* 9.1 11.4*  HGB 11.4* 10.5* 10.9*  HCT 34.7* 31.6* 32.8*  PLT 282 257 307  MCV 82.2 80.4 81.0  MCH 27.0 26.7 26.9  MCHC 32.9 33.2 33.2  RDW 14.5 14.3 14.4  LYMPHSABS 0.9  --   --  MONOABS 1.5*  --   --   EOSABS 0.2  --   --   BASOSABS 0.0  --   --     Chemistries   Recent Labs Lab 04/03/15 0305 04/04/15 0706 04/07/15 0930 04/08/15 0233 04/09/15 0350  NA 137 138 137 139 137  K 4.0 3.9 4.1 3.5 3.6  CL 107 108 105 108 107  CO2 26 23 27 24 26   GLUCOSE 115* 76 77 141* 126*  BUN 15 16 17 15 14   CREATININE 1.26* 1.56* 1.49* 1.21 1.23  CALCIUM 8.4* 8.2* 8.4* 8.0* 8.1*  AST  --   --  36  --   --   ALT  --   --  25  --   --   ALKPHOS  --   --  69  --   --   BILITOT  --   --  1.5*  --   --    ------------------------------------------------------------------------------------------------------------------ estimated creatinine clearance is 47.7 mL/min (by C-G formula based on Cr of 1.23). ------------------------------------------------------------------------------------------------------------------ No results for input(s): HGBA1C in the last 72 hours. ------------------------------------------------------------------------------------------------------------------ No results for input(s): CHOL, HDL, LDLCALC, TRIG, CHOLHDL, LDLDIRECT in the last 72 hours. ------------------------------------------------------------------------------------------------------------------ No results for input(s): TSH, T4TOTAL, T3FREE, THYROIDAB in the last 72 hours.  Invalid input(s):  FREET3 ------------------------------------------------------------------------------------------------------------------ No results for input(s): VITAMINB12, FOLATE, FERRITIN, TIBC, IRON, RETICCTPCT in the last 72 hours.  Coagulation profile  Recent Labs Lab 04/07/15 0930  INR 1.30     Recent Labs  04/07/15 0930  DDIMER 11.44*    Cardiac Enzymes  Recent Labs Lab 04/07/15 2110 04/08/15 0830  TROPONINI 0.37* 0.27*   ------------------------------------------------------------------------------------------------------------------ Invalid input(s): POCBNP     Time Spent in minutes  30 minutes   Azura Tufaro M.D on 04/09/2015 at 12:39 PM  Between 7am to 7pm - Pager - [phone]  After 7pm go to [email or web address] - password [facility]  [facility]   Office  [phone]

## 2015-04-09 NOTE — Progress Notes (Signed)
ANTICOAGULATION CONSULT NOTE - Follow Up Consult  Pharmacy Consult for apixaban (Eliquis) Indication: pulmonary embolus  Allergies  Allergen Reactions  . Donepezil     Medication listed but no reaction stated; family not sure of reaction  . Hydrocodone Nausea And Vomiting    Patient Measurements: Height: 6\' 1"  (185.4 cm) Weight: 220 lb 10.9 oz (100.1 kg) IBW/kg (Calculated) : 79.9  Vital Signs: Temp: 98.2 F (36.8 C) (06/25 0324) Temp Source: Oral (06/25 0324) BP: 141/107 mmHg (06/25 0400) Pulse Rate: 110 (06/25 0400)  Labs:  Recent Labs  04/07/15 0930  04/07/15 2110  04/08/15 0233 04/08/15 0830 04/08/15 1015 04/08/15 1922 04/09/15 0350  HGB 11.4*  --   --   --  10.5*  --   --   --  10.9*  HCT 34.7*  --   --   --  31.6*  --   --   --  32.8*  PLT 282  --   --   --  257  --   --   --  307  APTT 33  --   --   --   --   --   --   --   --   LABPROT 16.4*  --   --   --   --   --   --   --   --   INR 1.30  --   --   --   --   --   --   --   --   HEPARINUNFRC  --   < >  --   < >  --   --  0.26* 0.38 0.47  CREATININE 1.49*  --   --   --  1.21  --   --   --  1.23  TROPONINI  --   --  0.37*  --   --  0.27*  --   --   --   < > = values in this interval not displayed.  Estimated Creatinine Clearance: 47.7 mL/min (by C-G formula based on Cr of 1.23).   Medications:  Infusions:  . heparin 1,700 Units/hr (04/09/15 0600)    Assessment: 89 yoM admitted 6/23 with chest pain.  PMH includes advanced dementia, DM, nephrolithiasis, and abdominal pain with recent discharge from [facility] on 04/04/[facility].  Troponins were elevated and pharmacy was initially consulted to dose Heparin IV for r/o ACS.  CTa was positive for PE, RV to LV ratio 1.2, complete left pulmonary artery occlusion, and segmental obstruction of the right pulmonary artery system.  CCM was consulted, not felt to be a good candidate for thrombolysis.  Pharmacy is now consulted to transition to apixaban (Eliquis).  Today,  04/09/2015:  Heparin level: 0.47, remains therapeutic  CBC: Hgb 10.9 (low/stable), Plt 307  No bleeding reported or documented.  No infusion problems reported.  Renal: SCr 1.23, CrCl ~ 47 ml/min   Goal of Therapy:  Heparin level 0.3-0.7 units/ml Monitor platelets by anticoagulation protocol: Yes   Plan:   Discontinue heparin IV infusion at 10:00 AM 6/25  Start Eliquis 10mg  PO BID x7 days, then 5mg  PO BID. - first dose at 10:00 AM 6/25.  Continue to monitor renal function and CBC  Eliquis education prior to discharge.   Gretta Arab PharmD, BCPS Pager [phone] 04/09/2015 7:19 AM

## 2015-04-09 NOTE — Evaluation (Signed)
Physical Therapy Evaluation Patient Details Name: Marvin KENNERLY Sr. MRN: [id number] DOB: Nov 18, 1919 Today's Date: 04/09/2015   History of Present Illness  Marvin Bishop is a 81 y.o. male, past medical history of dementia, diabetes mellitus, nephrolithiasis, presents 6/23 with complaints of chest pain , patient is with advanced dementia, can't provide review of system or history, and was obtained from him he staff, and daughters at bedside, patient was recently hospitalized at [facility], with complaints of abdominal pain, negative workup, discharged on 04/04/15, resents today with complaints of chest pain, in ED patient had elevated troponins, as well workup was significant for elevated d-dimer is, so at CT chest angiogram done, which came back positive for PE, with RV to LV ratio of 1.2, with complete left pulmonary artery occlusion, and segmental obstruction of the right pulmonary artery system, positive for Bilateral LE DVT's  Clinical Impression  Patient  With HR to 140 , sats > 93% on RA while ambulating, frequent redirection for safety. Patient will benefit from PT to address problems listed in note below.    Follow Up Recommendations SNF, 24/7 supevision    Equipment Recommendations  None recommended by PT    Recommendations for Other Services       Precautions / Restrictions Precautions Precautions: Fall Precaution Comments: impulsive. monitor sats and HR      Mobility  Bed Mobility               General bed mobility comments: in recliner  Transfers Overall transfer level: Needs assistance Equipment used: Rolling walker (2 wheeled) Transfers: Sit to/from Stand Sit to Stand: Mod assist         General transfer comment: light mod assist to power up to stand; cues for hand placement and safety  Ambulation/Gait Ambulation/Gait assistance: Min assist Ambulation Distance (Feet): 200 Feet Assistive device: Rolling walker (2 wheeled)       General Gait  Details: Cues for safety, tends to walk out of RW, especially whebn turning around, Patient " dancing" whhile waking with RW, cues for  safety.  Stairs            Wheelchair Mobility    Modified Rankin (Stroke Patients Only)       Balance Overall balance assessment: Needs assistance Sitting-balance support: No upper extremity supported Sitting balance-Leahy Scale: Good     Standing balance support: No upper extremity supported;During functional activity Standing balance-Leahy Scale: Fair                               Pertinent Vitals/Pain Pain Assessment: Faces Faces Pain Scale: Hurts little more Pain Location: both hips Pain Descriptors / Indicators: Aching Pain Intervention(s): Monitored during session    Home Living Family/patient expects to be discharged to:: Private residence Living Arrangements: Children Available Help at Discharge: Family;Available PRN/intermittently Type of Home: House Home Access: Stairs to enter Entrance Stairs-Rails: Right;Left;Can reach both Entrance Stairs-Number of Steps: 4 Home Layout: One level Home Equipment: Walker - 2 wheels;Cane - single point Additional Comments: per daughter, needs rehab setting    Prior Function Level of Independence: Needs assistance               Hand Dominance        Extremity/Trunk Assessment   Upper Extremity Assessment: Generalized weakness           Lower Extremity Assessment: Generalized weakness      Cervical / Trunk Assessment: Normal  Communication      Cognition Arousal/Alertness: Awake/alert Behavior During Therapy: Impulsive;Agitated Overall Cognitive Status: History of cognitive impairments - at baseline Area of Impairment: Orientation;Attention;Following commands;Safety/judgement Orientation Level: Time;Situation;Place;[facility]   Memory: Decreased short-term memory Following Commands: Follows one step commands  inconsistently Safety/Judgement: Decreased awareness of safety     General Comments: pt redirected frequently, does tend to get somewhat agitataed when redirexcted, 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    General Comments      Exercises        Assessment/Plan    PT Assessment Patient needs continued PT services  PT Diagnosis Difficulty walking;Altered mental status   PT Problem List Decreased strength;Decreased activity tolerance;Decreased balance;Decreased mobility;Decreased coordination;Decreased cognition;Decreased knowledge of use of DME;Decreased safety awareness;Cardiopulmonary status limiting activity  PT Treatment Interventions DME instruction;Gait training;Functional mobility training;Therapeutic activities;Patient/family education   PT Goals (Current goals can be found in the Care Plan section) Acute Rehab PT Goals Patient Stated Goal: per daughter, needs rehab, unable to stay alone PT Goal Formulation: With family Time For Goal Achievement: 04/23/15 Potential to Achieve Goals: Good    Frequency Min 3X/week   Barriers to discharge Decreased caregiver support      Co-evaluation               End of Session Equipment Utilized During Treatment: Gait belt Activity Tolerance: Patient tolerated treatment well;Treatment limited secondary to medical complications (Comment) (had to limit due to HR 140) Patient left: in chair;with call bell/phone within reach;with chair alarm set;with family/visitor present Nurse Communication: Mobility status         Time: 1127-1150 PT Time Calculation (min) (ACUTE ONLY): 23 min   Charges:   PT Evaluation $Initial PT Evaluation Tier I: 1 Procedure PT Treatments $Gait Training: 8-22 mins   PT G Codes:        Marvin Bishop 04/09/2015, 12:49 PM Marvin Bishop PT [phone]

## 2015-04-10 DIAGNOSIS — Z5189 Encounter for other specified aftercare: Secondary | ICD-10-CM | POA: Diagnosis not present

## 2015-04-10 DIAGNOSIS — M6281 Muscle weakness (generalized): Secondary | ICD-10-CM | POA: Diagnosis not present

## 2015-04-10 DIAGNOSIS — B351 Tinea unguium: Secondary | ICD-10-CM | POA: Diagnosis not present

## 2015-04-10 DIAGNOSIS — M79673 Pain in unspecified foot: Secondary | ICD-10-CM | POA: Diagnosis not present

## 2015-04-10 DIAGNOSIS — E119 Type 2 diabetes mellitus without complications: Secondary | ICD-10-CM | POA: Diagnosis not present

## 2015-04-10 DIAGNOSIS — E785 Hyperlipidemia, unspecified: Secondary | ICD-10-CM | POA: Diagnosis not present

## 2015-04-10 DIAGNOSIS — I2699 Other pulmonary embolism without acute cor pulmonale: Secondary | ICD-10-CM | POA: Diagnosis not present

## 2015-04-10 DIAGNOSIS — J811 Chronic pulmonary edema: Secondary | ICD-10-CM | POA: Diagnosis not present

## 2015-04-10 DIAGNOSIS — R05 Cough: Secondary | ICD-10-CM | POA: Diagnosis not present

## 2015-04-10 DIAGNOSIS — K5900 Constipation, unspecified: Secondary | ICD-10-CM | POA: Diagnosis not present

## 2015-04-10 DIAGNOSIS — R600 Localized edema: Secondary | ICD-10-CM | POA: Diagnosis not present

## 2015-04-10 DIAGNOSIS — R5381 Other malaise: Secondary | ICD-10-CM | POA: Diagnosis not present

## 2015-04-10 DIAGNOSIS — E1151 Type 2 diabetes mellitus with diabetic peripheral angiopathy without gangrene: Secondary | ICD-10-CM | POA: Diagnosis not present

## 2015-04-10 DIAGNOSIS — F0390 Unspecified dementia without behavioral disturbance: Secondary | ICD-10-CM | POA: Diagnosis not present

## 2015-04-10 DIAGNOSIS — R079 Chest pain, unspecified: Secondary | ICD-10-CM | POA: Diagnosis not present

## 2015-04-10 DIAGNOSIS — I10 Essential (primary) hypertension: Secondary | ICD-10-CM | POA: Diagnosis not present

## 2015-04-10 DIAGNOSIS — R262 Difficulty in walking, not elsewhere classified: Secondary | ICD-10-CM | POA: Diagnosis not present

## 2015-04-10 DIAGNOSIS — M1990 Unspecified osteoarthritis, unspecified site: Secondary | ICD-10-CM | POA: Diagnosis not present

## 2015-04-10 DIAGNOSIS — R269 Unspecified abnormalities of gait and mobility: Secondary | ICD-10-CM | POA: Diagnosis not present

## 2015-04-10 DIAGNOSIS — R278 Other lack of coordination: Secondary | ICD-10-CM | POA: Diagnosis not present

## 2015-04-10 DIAGNOSIS — K219 Gastro-esophageal reflux disease without esophagitis: Secondary | ICD-10-CM | POA: Diagnosis not present

## 2015-04-10 DIAGNOSIS — D72829 Elevated white blood cell count, unspecified: Secondary | ICD-10-CM | POA: Diagnosis not present

## 2015-04-10 DIAGNOSIS — I82403 Acute embolism and thrombosis of unspecified deep veins of lower extremity, bilateral: Secondary | ICD-10-CM | POA: Diagnosis not present

## 2015-04-10 DIAGNOSIS — E118 Type 2 diabetes mellitus with unspecified complications: Secondary | ICD-10-CM | POA: Diagnosis not present

## 2015-04-10 DIAGNOSIS — M25559 Pain in unspecified hip: Secondary | ICD-10-CM | POA: Diagnosis not present

## 2015-04-10 LAB — HOMOCYSTEINE: Homocysteine: 9.7 umol/L (ref 0.0–15.0)

## 2015-04-10 LAB — CBC
HCT: 32.7 % — ABNORMAL LOW (ref 39.0–52.0)
Hemoglobin: 10.9 g/dL — ABNORMAL LOW (ref 13.0–17.0)
MCH: 27.1 pg (ref 26.0–34.0)
MCHC: 33.3 g/dL (ref 30.0–36.0)
MCV: 81.3 fL (ref 78.0–100.0)
PLATELETS: 358 10*3/uL (ref 150–400)
RBC: 4.02 MIL/uL — ABNORMAL LOW (ref 4.22–5.81)
RDW: 14.4 % (ref 11.5–15.5)
WBC: 11.8 10*3/uL — AB (ref 4.0–10.5)

## 2015-04-10 LAB — GLUCOSE, CAPILLARY
GLUCOSE-CAPILLARY: 101 mg/dL — AB (ref 65–99)
GLUCOSE-CAPILLARY: 121 mg/dL — AB (ref 65–99)
Glucose-Capillary: 103 mg/dL — ABNORMAL HIGH (ref 65–99)

## 2015-04-10 MED ORDER — INSULIN ASPART 100 UNIT/ML ~~LOC~~ SOLN: 0.0000 [IU] | Freq: Three times a day (TID) | SUBCUTANEOUS | Status: DC

## 2015-04-10 MED ORDER — BISACODYL 10 MG RE SUPP: 10.0000 mg | Freq: Every day | RECTAL | Status: DC | PRN

## 2015-04-10 MED ORDER — ACETAMINOPHEN 325 MG PO TABS: 650.0000 mg | ORAL_TABLET | Freq: Four times a day (QID) | ORAL | Status: DC | PRN

## 2015-04-10 MED ORDER — APIXABAN 5 MG PO TABS: ORAL_TABLET | ORAL | Status: DC

## 2015-04-10 MED ORDER — INSULIN DETEMIR 100 UNIT/ML ~~LOC~~ SOLN: 10.0000 [IU] | Freq: Every day | SUBCUTANEOUS | Status: DC

## 2015-04-10 MED ORDER — HYDROCODONE-ACETAMINOPHEN 5-325 MG PO TABS: 1.0000 | ORAL_TABLET | Freq: Four times a day (QID) | ORAL | Status: DC | PRN

## 2015-04-10 NOTE — Clinical Social Work Placement (Signed)
   CLINICAL SOCIAL WORK PLACEMENT  NOTE  Date:  04/10/2015  Patient Details  Name: Marvin STAPEL Sr. MRN: [id number] Date of Birth: 19-Jul-1920  Clinical Social Work is seeking post-discharge placement for this patient at the [place] level of care (*CSW will initial, date and re-position this form in  chart as items are completed):  Yes   Patient/family provided with [facility] Work Department's list of facilities offering this level of care within the geographic area requested by the patient (or if unable, by the patient's family).  Yes   Patient/family informed of their freedom to choose among providers that offer the needed level of care, that participate in Medicare, Medicaid or managed care program needed by the patient, have an available bed and are willing to accept the patient.  Yes   Patient/family informed of [facility]'s ownership interest in [facility] and [facility], as well as of the fact that they are under no obligation to receive care at these facilities.  PASRR submitted to EDS on 04/09/15     PASRR number received on 04/09/15     Existing PASRR number confirmed on       FL2 transmitted to all facilities in geographic area requested by pt/family on 04/09/15     FL2 transmitted to all facilities within larger geographic area on       Patient informed that his/her managed care company has contracts with or will negotiate with certain facilities, including the following:            Patient/family informed of bed offers received.  Patient [facility] bed at   [facility]    Physician recommends and patient chooses bed at      Patient to be transferred to  [facility] on  .April 10, 2015  Patient to be transferred to facility by     ambulance  Patient family notified on  April 10, 2015 of transfer.  Name of family member notified:    Bobbye Morton    PHYSICIAN       Additional Comment:     _______________________________________________ Carlean Jews, LCSW 04/10/2015, 4:16 PM

## 2015-04-10 NOTE — Discharge Summary (Signed)
Marvin GLASPY Sr., is a 81 y.o. male  DOB 1920-06-02  MRN [id number].  Admission date:  04/07/2015  Admitting Physician  [name], MD  Discharge Date:  04/10/2015   Primary MD  [name], MD  Recommendations for primary care physician for things to follow:  - check CBC , BMP during next visit   Admission Diagnosis  Cough [R05] Pulmonary emboli [I26.99]   Discharge Diagnosis  Cough [R05] Pulmonary emboli [I26.99]   Principal Problem:   PE (pulmonary embolism) Active Problems:   Diabetes mellitus   Dementia without behavioral disturbance   Elevated troponin      Past Medical History  Diagnosis Date  . Arthritis   . Hypertension   . Hypercholesteremia   . Insomnia     occasional  . Nephrolithiasis     right UVJ stone  . Dementia   . Memory loss   . Headache(784.0)     slight  . Asthma     hx of as child  . PONV (postoperative nausea and vomiting)   . Complete loss of vision     right eye  . Peripheral neuropathy     both legs  . Prostate cancer   . Diabetes mellitus     Past Surgical History  Procedure Laterality Date  . Appendectomy    . Pars plana vitrectomy  10/24/2011    Procedure: PARS PLANA VITRECTOMY WITH 23 GAUGE;  Surgeon: Adonis Brook, MD;  Location: [facility];  Service: Ophthalmology;  Laterality: Right;  . Tonsillectomy  as teen  . Knee arthroscopy Right   . Hemorroidectomy    . Cystoscopy w/ ureteral stent placement Right 10/01/2013    Procedure: cystoscopy right retrograde holmium laser lithotripsy;  Surgeon: Franchot Gallo, MD;  Location: [facility];  Service: Urology;  Laterality: Right;  holmium laser application  . Cornea removed Right 02-26-15       History of present illness and  Hospital Course:     Kindly see H&P for history of present illness and admission details, please review complete Labs, Consult reports and Test reports for all  details in brief  HPI  from the history and physical done on the day of admission on 6/23   Marvin Bishop is a 81 y.o. male, past medical history of dementia, diabetes mellitus, nephrolithiasis, resents with complaints of chest pain , patient is with advanced dementia, can't provide review of system or history, and was obtained from him he staff, and daughters at bedside, patient was recently hospitalized at [facility], with complaints of abdominal pain, negative workup, discharged on 04/04/15, resents today with complaints of chest pain, in ED patient had elevated troponins, as well workup was significant for elevated d-dimer is, so at CT chest angiogram done, which came back positive for PE, with RV to LV ratio of 1.2, with complete left pulmonary artery occlusion, and segmental obstruction of the right pulmonary artery system, patient was already on heparin drip out of suspicion for ACS, at the time of my  exam and evaluation, patient denies any chest pain, or any complaints, as mentioned earlier he is very reliable historian. As well patient was noticed to have lower extremity edema, reports it's been swollen for 1 week.  Hospital Course   Pulmonary embolism - Treated initially with heparin GTT , 2-D echo showing no evidence of right ventricular strain,venous Doppler significant for bilateral DVT , pulmonary consult appreciated, not a candidate for thrombolysis, patient transitioned to Eliquis 6/25 . - Follow on homocystine and ANA.   Diabetes mellitus -CBGs are acceptable, continue with Levemir, and insulin sliding scale on dischrge  Dementia without behavioral disturbances -Continue with Namenda  Elevated troponin - This is most likely in the setting of demand ischemia secondary to submassive pulmonary embolism, troponins are trending down 0.37 >> 0.27 will monitor on telemetry, 2-D echo showing EF 40-45%, with grade 1 diastolic dysfunction, diffuse hypokinesis.     Discharge  Condition:  stable   Follow UP  Follow-up Information    Follow up with HUSAIN,KARRAR, MD. Schedule an appointment as soon as possible for a visit in 1 week.   Specialty:  Internal Medicine   Why:  Posthospitalization follow-up   Contact information:   [address] [place] [address] [phone]         Discharge Instructions  and  Discharge Medications    Discharge Instructions    Discharge instructions    Complete by:  As directed   Follow with Primary MD [name], MD in 7 days   Get CBC, CMP, 2 view Chest X ray checked  by Primary MD next visit.    Activity: As tolerated with Full fall precautions use walker/cane & assistance as needed   Disposition SNF   Diet: Heart Healthy , carbohydrate modified , with feeding assistance and aspiration precautions.  For Heart failure patients - Check your Weight same time everyday, if you gain over 2 pounds, or you develop in leg swelling, experience more shortness of breath or chest pain, call your Primary MD immediately. Follow Cardiac Low Salt Diet and 1.5 lit/day fluid restriction.   On your next visit with your primary care physician please Get Medicines reviewed and adjusted.   Please request your Prim.MD to go over all Hospital Tests and Procedure/Radiological results at the follow up, please get all Hospital records sent to your Prim MD by signing hospital release before you go home.   If you experience worsening of your admission symptoms, develop shortness of breath, life threatening emergency, suicidal or homicidal thoughts you must seek medical attention immediately by calling 911 or calling your MD immediately  if symptoms less severe.  You Must read complete instructions/literature along with all the possible adverse reactions/side effects for all the Medicines you take and that have been prescribed to you. Take any new Medicines after you have completely understood and accpet all the  possible adverse reactions/side effects.   Do not drive, operating heavy machinery, perform activities at heights, swimming or participation in water activities or provide baby sitting services if your were admitted for syncope or siezures until you have seen by Primary MD or a Neurologist and advised to do so again.  Do not drive when taking Pain medications.    Do not take more than prescribed Pain, Sleep and Anxiety Medications  Special Instructions: If you have smoked or chewed Tobacco  in the last 2 yrs please stop smoking, stop any regular Alcohol  and or any Recreational drug use.  Wear Seat  belts while driving.   Please note  You were cared for by a hospitalist during your hospital stay. If you have any questions about your discharge medications or the care you received while you were in the hospital after you are discharged, you can call the unit and asked to speak with the hospitalist on call if the hospitalist that took care of you is not available. Once you are discharged, your primary care physician will handle any further medical issues. Please note that NO REFILLS for any discharge medications will be authorized once you are discharged, as it is imperative that you return to your primary care physician (or establish a relationship with a primary care physician if you do not have one) for your aftercare needs so that they can reassess your need for medications and monitor your lab values.     Increase activity slowly    Complete by:  As directed             Medication List    STOP taking these medications        aspirin EC 81 MG tablet     LEVEMIR FLEXTOUCH 100 UNIT/ML Pen  Generic drug:  Insulin Detemir  Replaced by:  insulin detemir 100 UNIT/ML injection     linagliptin 5 MG Tabs tablet  Commonly known as:  TRADJENTA     meloxicam 7.5 MG tablet  Commonly known as:  MOBIC     metFORMIN 500 MG tablet  Commonly known as:  GLUCOPHAGE      TAKE these medications         acetaminophen 325 MG tablet  Commonly known as:  TYLENOL  Take 2 tablets (650 mg total) by mouth every 6 (six) hours as needed for mild pain (or Fever >/= 101).     apixaban 5 MG Tabs tablet  Commonly known as:  ELIQUIS  use 10 mg oral twice a day for next 6 days till 7/1 , then decrease to 5 mg oral twice a day thereafter     bisacodyl 10 MG suppository  Commonly known as:  DULCOLAX  Place 1 suppository (10 mg total) rectally daily as needed for moderate constipation.     DSS 100 MG Caps  Take 200 mg by mouth 2 (two) times daily as needed for mild constipation.     esomeprazole 40 MG capsule  Commonly known as:  NEXIUM  Take 1 capsule (40 mg total) by mouth 2 (two) times daily before a meal.     famotidine 20 MG tablet  Commonly known as:  PEPCID  Take 1 tablet (20 mg total) by mouth at bedtime.     furosemide 20 MG tablet  Commonly known as:  LASIX  Take 20 mg by mouth See admin instructions. Take on Mondays Wednesday and Friday     glipiZIDE 10 MG tablet  Commonly known as:  GLUCOTROL  Take 10 mg by mouth 2 (two) times daily before a meal.     HYDROcodone-acetaminophen 5-325 MG per tablet  Commonly known as:  NORCO/VICODIN  Take 1 tablet by mouth every 6 (six) hours as needed for moderate pain.     insulin aspart 100 UNIT/ML injection  Commonly known as:  novoLOG  Inject 0-15 Units into the skin 3 (three) times daily with meals.     insulin detemir 100 UNIT/ML injection  Commonly known as:  LEVEMIR  Inject 0.1 mLs (10 Units total) into the skin daily at 10 pm.     memantine  10 MG tablet  Commonly known as:  NAMENDA  Take 1 tablet (10 mg total) by mouth 2 (two) times daily.     multivitamin with minerals Tabs tablet  Take 1 tablet by mouth daily.     ondansetron 4 MG tablet  Commonly known as:  ZOFRAN  Take 4 mg by mouth every 8 (eight) hours as needed for nausea or vomiting.     simvastatin 40 MG tablet  Commonly known as:  ZOCOR  Take 40 mg by  mouth every morning.          Diet and Activity recommendation: See Discharge Instructions above   Consults obtained -  pulmonary   Major procedures and Radiology Reports - PLEASE review detailed and final reports for all details, in brief -      Dg Chest 2 View  04/07/2015   CLINICAL DATA:  Left-sided chest pain and cough for 1 week  EXAM: CHEST  2 VIEW  COMPARISON:  April 01, 2015  FINDINGS: There is a nipple shadow on the right. There is no edema or consolidation. Heart size is upper normal with pulmonary vascularity within normal limits. No adenopathy. There is degenerative change in the thoracic spine.  IMPRESSION: No edema or consolidation.   Electronically Signed   By: Lowella Grip III M.D.   On: 04/07/2015 10:05   Ct Angio Chest Pe W/cm &/or Wo Cm  04/07/2015   CLINICAL DATA:  Chest pain, shortness of breath since yesterday.  EXAM: CT ANGIOGRAPHY CHEST WITH CONTRAST  TECHNIQUE: Multidetector CT imaging of the chest was performed using the standard protocol during bolus administration of intravenous contrast. Multiplanar CT image reconstructions and MIPs were obtained to evaluate the vascular anatomy.  CONTRAST:  138mL OMNIPAQUE IOHEXOL 350 MG/ML SOLN  COMPARISON:  None.  FINDINGS: Chest wall: No chest wall mass, supraclavicular or axillary lymphadenopathy. The left thyroid lobe contains a 15 mm nodule posteriorly. Ultrasound followup is suggested. The bony thorax is intact. No acute bony findings or destructive bony changes. Advanced degenerative changes involving the thoracic spine and sternoclavicular joints.  Mediastinum: The heart is borderline in size. No pericardial effusion. The aorta is normal in caliber. Moderate atherosclerotic calcifications. No dissection. There are three-vessel coronary artery calcifications. No mediastinal or hilar mass or adenopathy. The esophagus is grossly normal.  Pulmonary arteries: The main pulmonary artery is mildly enlarged. The left pulmonary  artery is completely occluded by clot. There is also subsegmental pulmonary emboli on the right side. Findings of right heart strain with RV LV ratio at greater than 1 (1.2).  Lungs/pleura: There is a small left pleural effusion and patchy areas of bibasilar atelectasis. No worrisome pulmonary lesions. No pulmonary edema.  Upper abdomen:  No significant findings.  Review of the MIP images confirms the above findings.  IMPRESSION: Positive for acute PE with CT evidence of right heart strain (RV/LV Ratio = 1.2) consistent with at least submassive (intermediate risk) PE. The presence of right heart strain has been associated with an increased risk of morbidity and mortality. Please activate Code PE by paging [phone].  These results were called by telephone at the time of interpretation on 04/07/2015 at 2:35 pm to Dr. Debby Freiberg , who verbally acknowledged these results.   Electronically Signed   By: Marijo Sanes M.D.   On: 04/07/2015 14:35   Dg Chest Port 1 View  04/08/2015   CLINICAL DATA:  Known pulmonary emboli. Shortness of breath. Subsequent encounter.  EXAM: PORTABLE CHEST - 1  VIEW  COMPARISON:  Chest radiograph and CTA of the chest performed 04/07/2015  FINDINGS: The lungs are well-aerated. Patchy right-sided atelectasis is again noted. A small left pleural effusion is again seen. Mild vascular congestion is noted. No pneumothorax is seen.  The cardiomediastinal silhouette is borderline enlarged. No acute osseous abnormalities are seen.  IMPRESSION: Patchy right-sided atelectasis again noted. Small left pleural effusion again seen. Mild vascular congestion and borderline cardiomegaly.   Electronically Signed   By: Garald Balding M.D.   On: 04/08/2015 06:33   Dg Chest Portable 1 View  04/01/2015   CLINICAL DATA:  Shortness of breath, weakness  EXAM: PORTABLE CHEST - 1 VIEW  COMPARISON:  08/23/2014  FINDINGS: Cardiac leads obscure detail. Right AC joint degenerative change. Heart size is mildly  enlarged without evidence for edema. No focal pulmonary opacity. No pleural effusion.  IMPRESSION: Mild cardiomegaly without focal acute finding.   Electronically Signed   By: Conchita Paris M.D.   On: 04/01/2015 18:39   Ct Renal Stone Study  04/01/2015   CLINICAL DATA:  81 year old male with nausea abdominal pain, abdominal distention, shortness of breath, weakness. Initial encounter.  EXAM: CT ABDOMEN AND PELVIS WITHOUT CONTRAST  TECHNIQUE: Multidetector CT imaging of the abdomen and pelvis was performed following the standard protocol without IV contrast.  COMPARISON:  CT Abdomen and Pelvis 08/23/2014.  FINDINGS: Mild motion artifact at the lung bases. Stable cardiomegaly. No pericardial effusion. Stable scarring or atelectasis in the right costophrenic angle.  Advanced degenerative changes in the spine with bulky osteophytosis. Multilevel lumbar spinal stenosis. No acute osseous abnormality identified.  Sequelae of prostatectomy. No pelvic free fluid. Diminutive bladder. Negative rectum. Decompressed small bowel loops in the pelvis.  Diverticulosis of the sigmoid colon with no active inflammation identified. Negative left colon aside from retained stool. Redundant transverse colon with retained stool. Negative right colon. Normal appendix. Negative terminal ileum. No dilated small bowel small hiatal hernia. Decompressed stomach. Negative duodenum.  Noncontrast liver with chronic left lateral lobe hemangioma which is better demonstrated on the prior contrast-enhanced study. Negative noncontrast CT appearance of the gallbladder. Noncontrast spleen, pancreas and adrenal glands are within normal limits. No hydronephrosis. Chronic left renal cysts. No nephrolithiasis. No hydroureter or ureteral calculus. No abdominal free fluid. Aortoiliac calcified atherosclerosis noted. No lymphadenopathy identified. Small fat containing inguinal hernias are stable.  IMPRESSION: 1. No urologic calculi. No acute or inflammatory  process identified in the abdomen or pelvis. 2. Chronic findings including sigmoid diverticulosis, Aortoiliac calcified atherosclerosis, Advanced spine degeneration with lumbar spinal stenosis.   Electronically Signed   By: Genevie Ann M.D.   On: 04/01/2015 18:56    Micro Results    Recent Results (from the past 240 hour(s))  MRSA PCR Screening     Status: None   Collection Time: 04/07/15  8:25 PM  Result Value Ref Range Status   MRSA by PCR NEGATIVE NEGATIVE Final    Comment:        The GeneXpert MRSA Assay (FDA approved for NASAL specimens only), is one component of a comprehensive MRSA colonization surveillance program. It is not intended to diagnose MRSA infection nor to guide or monitor treatment for MRSA infections.        Today   Subjective:   Marvin Bishop today has no headache,no chest abdominal pain,no new weakness tingling or numbness, feels much better e today.   Objective:   Blood pressure 131/63, pulse 100, temperature 99.8 F (37.7 C), temperature source Oral, resp. rate  18, height 6\' 1"  (1.854 m), weight 100.1 kg (220 lb 10.9 oz), SpO2 100 %.   Intake/Output Summary (Last 24 hours) at 04/10/15 1050 Last data filed at 04/10/15 0745  Gross per 24 hour  Intake    200 ml  Output   1050 ml  Net   -850 ml    Exam  Awake Alert, Oriented X 2, No new F.N deficits, Normal affect [place].AT, right eye blindness Supple Neck,No JVD, No cervical lymphadenopathy appriciated.  Symmetrical Chest wall movement, Good air movement bilaterally,  RRR,No Gallops,Rubs or new Murmurs, No Parasternal Heave +ve B.Sounds, Abd Soft, No tenderness, No organomegaly appriciated, No rebound - guarding or rigidity. No Cyanosis, Clubbing , No new Rash or bruise , lower extremity edema left > right, improving Data Review   CBC w Diff: Lab Results  Component Value Date   WBC 11.8* 04/10/2015   HGB 10.9* 04/10/2015   HCT 32.7* 04/10/2015   PLT 358 04/10/2015   LYMPHOPCT 9*  04/07/2015   MONOPCT 14* 04/07/2015   EOSPCT 1 04/07/2015   BASOPCT 0 04/07/2015    CMP: Lab Results  Component Value Date   NA 137 04/09/2015   K 3.6 04/09/2015   CL 107 04/09/2015   CO2 26 04/09/2015   BUN 14 04/09/2015   CREATININE 1.23 04/09/2015   PROT 6.8 04/07/2015   ALBUMIN 3.0* 04/07/2015   BILITOT 1.5* 04/07/2015   ALKPHOS 69 04/07/2015   AST 36 04/07/2015   ALT 25 04/07/2015  .   Total Time in preparing paper work, data evaluation and todays exam - 35 minutes  Stephonie Wilcoxen M.D on 04/10/2015 at 10:50 AM  [facility]   Office  [phone]

## 2015-04-10 NOTE — Clinical Social Work Note (Signed)
CSW secured a bed at [facility] who stated that they received a two day auth from pt insurance.  Per [facility] will be assessed during the week but the facility received an ok for two days.  CSW spoke with pt's daughter Bobbye Morton to coordinate her signing papers at [place] will be transported to [facility] via ambulance when paperwork is complete.  Dede Query, LCSW [place] Worker - Weekend Coverage cell #: [phone]

## 2015-04-10 NOTE — Discharge Summary (Signed)
[facility] and gave report to RN Darlina Guys.  Pt being transferred to this facility as arranged by SW and pt's daughter, Bobbye Morton.  Ambulance has just arrived to transport pt.

## 2015-04-11 ENCOUNTER — Non-Acute Institutional Stay: Payer: Medicare PPO | Admitting: Adult Health

## 2015-04-11 ENCOUNTER — Encounter: Payer: Self-pay | Admitting: Adult Health

## 2015-04-11 ENCOUNTER — Ambulatory Visit: Payer: Medicare PPO | Admitting: Podiatry

## 2015-04-11 DIAGNOSIS — I2699 Other pulmonary embolism without acute cor pulmonale: Secondary | ICD-10-CM

## 2015-04-11 DIAGNOSIS — F0390 Unspecified dementia without behavioral disturbance: Secondary | ICD-10-CM

## 2015-04-11 DIAGNOSIS — D72829 Elevated white blood cell count, unspecified: Secondary | ICD-10-CM

## 2015-04-11 DIAGNOSIS — K219 Gastro-esophageal reflux disease without esophagitis: Secondary | ICD-10-CM | POA: Diagnosis not present

## 2015-04-11 DIAGNOSIS — E785 Hyperlipidemia, unspecified: Secondary | ICD-10-CM

## 2015-04-11 DIAGNOSIS — R600 Localized edema: Secondary | ICD-10-CM | POA: Diagnosis not present

## 2015-04-11 DIAGNOSIS — R5381 Other malaise: Secondary | ICD-10-CM

## 2015-04-11 DIAGNOSIS — K5900 Constipation, unspecified: Secondary | ICD-10-CM | POA: Diagnosis not present

## 2015-04-11 DIAGNOSIS — E118 Type 2 diabetes mellitus with unspecified complications: Secondary | ICD-10-CM

## 2015-04-11 NOTE — Progress Notes (Signed)
Patient ID: Marvin DENTE Sr., male   DOB: [birth year], 81 y.o.   MRN: [id number]   04/11/2015  Facility:  [facility] Home Location:  [place] Room Number: 702-P LEVEL OF CARE:  SNF (31)   Chief Complaint  Patient presents with  . Hospitalization Follow-up    Physical deconditioning, pulmonary embolism, diabetes mellitus, constipation, GERD, bilateral lower extremity edema, dementia, hyperlipidemia and leukocytosis    HISTORY OF PRESENT ILLNESS:  This is a 81 year old male who was being admitted to [facility] on 04/10/15. He has PMH of arthritis, hypertension, hypercholesterolemia, insomnia, advanced dementia, asthma, peripheral neuropathy, prostate cancer and diabetes mellitus. He was recently hospitalized. Complaints of abdominal pain. Work up was negative so he was discharged on 04/04/15. He presented to ED, this time, complaints of chest pain. Troponin and d-dimer were elevated so CT chest was done. CT chest was positive for PE. He had heparin drip then transitioned to Eliquis on 6/25.  He has been admitted for a short-term rehabilitation.   PAST MEDICAL HISTORY:  Past Medical History  Diagnosis Date  . Arthritis   . Hypertension   . Hypercholesteremia   . Insomnia     occasional  . Nephrolithiasis     right UVJ stone  . Dementia   . Memory loss   . Headache(784.0)     slight  . Asthma     hx of as child  . PONV (postoperative nausea and vomiting)   . Complete loss of vision     right eye  . Peripheral neuropathy     both legs  . Prostate cancer   . Diabetes mellitus     CURRENT MEDICATIONS: Reviewed per MAR/see medication list  Allergies  Allergen Reactions  . Donepezil     Medication listed but no reaction stated; family not sure of reaction  . Hydrocodone Nausea And Vomiting     REVIEW OF SYSTEMS:  GENERAL: no change in appetite, no fatigue, no weight changes, no fever, chills or weakness RESPIRATORY: no cough, SOB, DOE,  wheezing, hemoptysis CARDIAC: no chest pain, or palpitations GI: no abdominal pain, diarrhea, constipation, heart burn, nausea or vomiting  PHYSICAL EXAMINATION  GENERAL: no acute distress, overwieght EYES: conjunctivae normal, sclerae normal, normal eye lids NECK: supple, trachea midline, no neck masses, no thyroid tenderness, no thyromegaly LYMPHATICS: no LAN in the neck, no supraclavicular LAN RESPIRATORY: breathing is even & unlabored, BS CTAB CARDIAC: RRR, no murmur,no extra heart sounds, BLE edema 2+ GI: abdomen soft, normal BS, no masses, no tenderness, no hepatomegaly, no splenomegaly EXTREMITIES: Able to move 4 extremities PSYCHIATRIC: the patient is alert & oriented to person, affect & behavior appropriate  LABS/RADIOLOGY: Labs reviewed: Basic Metabolic Panel:  Recent Labs  04/07/15 0930 04/08/15 0233 04/09/15 0350  NA 137 139 137  K 4.1 3.5 3.6  CL 105 108 107  CO2 27 24 26   GLUCOSE 77 141* 126*  BUN 17 15 14   CREATININE 1.49* 1.21 1.23  CALCIUM 8.4* 8.0* 8.1*   Liver Function Tests:  Recent Labs  03/08/15 1816 04/01/15 1530 04/07/15 0930  AST 27 28 36  ALT 17 17 25   ALKPHOS 83 79 69  BILITOT 0.7 1.1 1.5*  PROT 7.5 7.6 6.8  ALBUMIN 3.6 3.5 3.0*    Recent Labs  08/23/14 1616 04/01/15 1530 04/07/15 0930  LIPASE 15 33 14*   No results for input(s): AMMONIA in the last 8760 hours. CBC:  Recent Labs  03/08/15 1816  04/01/15 1530  04/07/15 0930 04/08/15 0233 04/09/15 0350 04/10/15 0506  WBC 8.5 10.7*  < > 10.8* 9.1 11.4* 11.8*  NEUTROABS 7.5 7.8*  --  8.2*  --   --   --   HGB 13.7 14.0  < > 11.4* 10.5* 10.9* 10.9*  HCT 41.2 41.5  < > 34.7* 31.6* 32.8* 32.7*  MCV 81.3 82.2  < > 82.2 80.4 81.0 81.3  PLT 189 199  < > 282 257 307 358  < > = values in this interval not displayed.  Cardiac Enzymes:  Recent Labs  04/01/15 1530 04/07/15 2110 04/08/15 0830  TROPONINI 0.03 0.37* 0.27*   CBG:  Recent Labs  04/10/15 0739 04/10/15 1247  04/10/15 1434  GLUCAP 101* 121* 103*      Dg Chest 2 View  04/07/2015   CLINICAL DATA:  Left-sided chest pain and cough for 1 week  EXAM: CHEST  2 VIEW  COMPARISON:  April 01, 2015  FINDINGS: There is a nipple shadow on the right. There is no edema or consolidation. Heart size is upper normal with pulmonary vascularity within normal limits. No adenopathy. There is degenerative change in the thoracic spine.  IMPRESSION: No edema or consolidation.   Electronically Signed   By: Lowella Grip III M.D.   On: 04/07/2015 10:05   Ct Angio Chest Pe W/cm &/or Wo Cm  04/07/2015   CLINICAL DATA:  Chest pain, shortness of breath since yesterday.  EXAM: CT ANGIOGRAPHY CHEST WITH CONTRAST  TECHNIQUE: Multidetector CT imaging of the chest was performed using the standard protocol during bolus administration of intravenous contrast. Multiplanar CT image reconstructions and MIPs were obtained to evaluate the vascular anatomy.  CONTRAST:  169mL OMNIPAQUE IOHEXOL 350 MG/ML SOLN  COMPARISON:  None.  FINDINGS: Chest wall: No chest wall mass, supraclavicular or axillary lymphadenopathy. The left thyroid lobe contains a 15 mm nodule posteriorly. Ultrasound followup is suggested. The bony thorax is intact. No acute bony findings or destructive bony changes. Advanced degenerative changes involving the thoracic spine and sternoclavicular joints.  Mediastinum: The heart is borderline in size. No pericardial effusion. The aorta is normal in caliber. Moderate atherosclerotic calcifications. No dissection. There are three-vessel coronary artery calcifications. No mediastinal or hilar mass or adenopathy. The esophagus is grossly normal.  Pulmonary arteries: The main pulmonary artery is mildly enlarged. The left pulmonary artery is completely occluded by clot. There is also subsegmental pulmonary emboli on the right side. Findings of right heart strain with RV LV ratio at greater than 1 (1.2).  Lungs/pleura: There is a small left  pleural effusion and patchy areas of bibasilar atelectasis. No worrisome pulmonary lesions. No pulmonary edema.  Upper abdomen:  No significant findings.  Review of the MIP images confirms the above findings.  IMPRESSION: Positive for acute PE with CT evidence of right heart strain (RV/LV Ratio = 1.2) consistent with at least submassive (intermediate risk) PE. The presence of right heart strain has been associated with an increased risk of morbidity and mortality. Please activate Code PE by paging [phone].  These results were called by telephone at the time of interpretation on 04/07/2015 at 2:35 pm to Dr. Debby Freiberg , who verbally acknowledged these results.   Electronically Signed   By: Marijo Sanes M.D.   On: 04/07/2015 14:35   Dg Chest Port 1 View  04/08/2015   CLINICAL DATA:  Known pulmonary emboli. Shortness of breath. Subsequent encounter.  EXAM: PORTABLE CHEST - 1 VIEW  COMPARISON:  Chest  radiograph and CTA of the chest performed 04/07/2015  FINDINGS: The lungs are well-aerated. Patchy right-sided atelectasis is again noted. A small left pleural effusion is again seen. Mild vascular congestion is noted. No pneumothorax is seen.  The cardiomediastinal silhouette is borderline enlarged. No acute osseous abnormalities are seen.  IMPRESSION: Patchy right-sided atelectasis again noted. Small left pleural effusion again seen. Mild vascular congestion and borderline cardiomegaly.   Electronically Signed   By: Garald Balding M.D.   On: 04/08/2015 06:33   Dg Chest Portable 1 View  04/01/2015   CLINICAL DATA:  Shortness of breath, weakness  EXAM: PORTABLE CHEST - 1 VIEW  COMPARISON:  08/23/2014  FINDINGS: Cardiac leads obscure detail. Right AC joint degenerative change. Heart size is mildly enlarged without evidence for edema. No focal pulmonary opacity. No pleural effusion.  IMPRESSION: Mild cardiomegaly without focal acute finding.   Electronically Signed   By: Conchita Paris M.D.   On: 04/01/2015  18:39   Ct Renal Stone Study  04/01/2015   CLINICAL DATA:  81 year old male with nausea abdominal pain, abdominal distention, shortness of breath, weakness. Initial encounter.  EXAM: CT ABDOMEN AND PELVIS WITHOUT CONTRAST  TECHNIQUE: Multidetector CT imaging of the abdomen and pelvis was performed following the standard protocol without IV contrast.  COMPARISON:  CT Abdomen and Pelvis 08/23/2014.  FINDINGS: Mild motion artifact at the lung bases. Stable cardiomegaly. No pericardial effusion. Stable scarring or atelectasis in the right costophrenic angle.  Advanced degenerative changes in the spine with bulky osteophytosis. Multilevel lumbar spinal stenosis. No acute osseous abnormality identified.  Sequelae of prostatectomy. No pelvic free fluid. Diminutive bladder. Negative rectum. Decompressed small bowel loops in the pelvis.  Diverticulosis of the sigmoid colon with no active inflammation identified. Negative left colon aside from retained stool. Redundant transverse colon with retained stool. Negative right colon. Normal appendix. Negative terminal ileum. No dilated small bowel small hiatal hernia. Decompressed stomach. Negative duodenum.  Noncontrast liver with chronic left lateral lobe hemangioma which is better demonstrated on the prior contrast-enhanced study. Negative noncontrast CT appearance of the gallbladder. Noncontrast spleen, pancreas and adrenal glands are within normal limits. No hydronephrosis. Chronic left renal cysts. No nephrolithiasis. No hydroureter or ureteral calculus. No abdominal free fluid. Aortoiliac calcified atherosclerosis noted. No lymphadenopathy identified. Small fat containing inguinal hernias are stable.  IMPRESSION: 1. No urologic calculi. No acute or inflammatory process identified in the abdomen or pelvis. 2. Chronic findings including sigmoid diverticulosis, Aortoiliac calcified atherosclerosis, Advanced spine degeneration with lumbar spinal stenosis.   Electronically  Signed   By: Genevie Ann M.D.   On: 04/01/2015 18:56    ASSESSMENT/PLAN:  Physical deconditioning - for rehabilitation  Pulmonary embolism - continue Eliquis 10 mg by mouth twice a day 5 days then 5 mg by mouth twice a day starting on 04/16/15  Diabetes mellitus, type II - hemoglobin A1c 7.3; continue glipizide 10 mg 1 tab by mouth twice a day, Levemir 10 units subcutaneous daily at bedtime and Humalog sliding scale 3 times a day with meals  Constipation - continue Colace 200 mg by mouth twice a day  GERD - continue Prilosec 20 mg 1 tab by mouth twice a day  Bilateral lower extremity edema - continue Lasix 20 mg on Mondays-Wednesdays-Fridays  Dementia - continue Namenda 10 mg by mouth twice a day  Hyperlipidemia - continue Zocor 40 mg by mouth every a.m.  Leukocytosis - wbc 11.8; will monitor    Goals of care:  Short-term rehabilitation  Labs/test ordered:  CBC, CMP and hgba1c  Spent 50 minutes in patient care.    [facility], NP [employer] [phone]

## 2015-04-12 ENCOUNTER — Other Ambulatory Visit: Payer: Self-pay

## 2015-04-12 ENCOUNTER — Other Ambulatory Visit: Payer: Self-pay | Admitting: *Deleted

## 2015-04-12 ENCOUNTER — Non-Acute Institutional Stay: Payer: Medicare PPO | Admitting: Internal Medicine

## 2015-04-12 ENCOUNTER — Ambulatory Visit: Payer: Self-pay

## 2015-04-12 ENCOUNTER — Ambulatory Visit: Payer: Self-pay | Admitting: Oncology

## 2015-04-12 DIAGNOSIS — I82403 Acute embolism and thrombosis of unspecified deep veins of lower extremity, bilateral: Secondary | ICD-10-CM

## 2015-04-12 DIAGNOSIS — D72829 Elevated white blood cell count, unspecified: Secondary | ICD-10-CM

## 2015-04-12 DIAGNOSIS — E118 Type 2 diabetes mellitus with unspecified complications: Secondary | ICD-10-CM | POA: Diagnosis not present

## 2015-04-12 DIAGNOSIS — F0390 Unspecified dementia without behavioral disturbance: Secondary | ICD-10-CM | POA: Diagnosis not present

## 2015-04-12 DIAGNOSIS — E785 Hyperlipidemia, unspecified: Secondary | ICD-10-CM

## 2015-04-12 DIAGNOSIS — K219 Gastro-esophageal reflux disease without esophagitis: Secondary | ICD-10-CM | POA: Diagnosis not present

## 2015-04-12 DIAGNOSIS — R5381 Other malaise: Secondary | ICD-10-CM

## 2015-04-12 DIAGNOSIS — I2699 Other pulmonary embolism without acute cor pulmonale: Secondary | ICD-10-CM | POA: Diagnosis not present

## 2015-04-12 MED ORDER — HYDROCODONE-ACETAMINOPHEN 5-325 MG PO TABS: ORAL_TABLET | ORAL | Status: DC

## 2015-04-12 NOTE — Progress Notes (Signed)
Patient ID: Marvin GENTZLER Sr., male   DOB: 23-Apr-1920, 81 y.o.   MRN: [id number]     [facility]   PCP: [name], MD  Code Status: full code  Allergies  Allergen Reactions  . Donepezil     Medication listed but no reaction stated; family not sure of reaction  . Hydrocodone Nausea And Vomiting    Chief Complaint  Patient presents with  . New Admit To SNF     HPI:  81 year old patient is here for short term rehabilitation post hospital admission from 04/07/15-04/10/15 with chest pain and diagnosed to have pulmonary embolism and bilateral lower extremity DVT. He was started on heparin drip and later switched to po eliquis. He is seen in his room today. He denies any concerns. he has PMH of hypertension, advanced dementia, peripheral neuropathy, prostate cancer and diabetes mellitus among others.    Review of Systems:  Constitutional: Negative for fever, chills, diaphoresis.  HENT: Negative for headache, congestion, nasal discharge Eyes: Negative for eye pain, blurred vision, double vision and discharge.  Respiratory: Negative for cough, shortness of breath and wheezing.   Cardiovascular: Negative for chest pain, palpitations. Positive for  leg swelling.  Gastrointestinal: Negative for heartburn, nausea, vomiting, abdominal pain. Had bowel movement this am Genitourinary: Negative for dysuria  Musculoskeletal: Negative for back pain, falls Neurological: Negative for dizziness, tingling, focal weakness Psychiatric/Behavioral: Negative for depression. Positive for dementia.   Past Medical History  Diagnosis Date  . Arthritis   . Hypertension   . Hypercholesteremia   . Insomnia     occasional  . Nephrolithiasis     right UVJ stone  . Dementia   . Memory loss   . Headache(784.0)     slight  . Asthma     hx of as child  . PONV (postoperative nausea and vomiting)   . Complete loss of vision     right eye  . Peripheral neuropathy    both legs  . Prostate cancer   . Diabetes mellitus    Past Surgical History  Procedure Laterality Date  . Appendectomy    . Pars plana vitrectomy  10/24/2011    Procedure: PARS PLANA VITRECTOMY WITH 23 GAUGE;  Surgeon: Adonis Brook, MD;  Location: [facility];  Service: Ophthalmology;  Laterality: Right;  . Tonsillectomy  as teen  . Knee arthroscopy Right   . Hemorroidectomy    . Cystoscopy w/ ureteral stent placement Right 10/01/2013    Procedure: cystoscopy right retrograde holmium laser lithotripsy;  Surgeon: Franchot Gallo, MD;  Location: [facility];  Service: Urology;  Laterality: Right;  holmium laser application  . Cornea removed Right 02-26-15   Social History:   reports that he quit smoking about 48 years ago. His smoking use included Cigarettes. He smoked 0.25 packs per day. He has never used smokeless tobacco. He reports that he drinks alcohol. He reports that he does not use illicit drugs.  Family History  Problem Relation Age of Onset  . Cancer Mother     uterus  . Heart Problems Father   . Cancer Brother     stomach  . Cancer Brother     throat  . Colon cancer Brother     Medications: Patient's Medications  New Prescriptions   No medications on file  Previous Medications   ACETAMINOPHEN (TYLENOL) 325 MG TABLET    Take 2 tablets (650 mg total) by mouth every 6 (six) hours as needed for mild  pain (or Fever >/= 101).   APIXABAN (ELIQUIS) 5 MG TABS TABLET    use 10 mg oral twice a day for next 6 days till 7/1 , then decrease to 5 mg oral twice a day thereafter   BISACODYL (DULCOLAX) 10 MG SUPPOSITORY    Place 1 suppository (10 mg total) rectally daily as needed for moderate constipation.   DOCUSATE SODIUM 100 MG CAPS    Take 200 mg by mouth 2 (two) times daily as needed for mild constipation.   ESOMEPRAZOLE (NEXIUM) 40 MG CAPSULE    Take 1 capsule (40 mg total) by mouth 2 (two) times daily before a meal.   FAMOTIDINE (PEPCID) 20 MG TABLET    Take 1 tablet (20 mg total) by  mouth at bedtime.   FUROSEMIDE (LASIX) 20 MG TABLET    Take 20 mg by mouth See admin instructions. Take on Mondays Wednesday and Friday   GLIPIZIDE (GLUCOTROL) 10 MG TABLET    Take 10 mg by mouth 2 (two) times daily before a meal.    HYDROCODONE-ACETAMINOPHEN (NORCO/VICODIN) 5-325 MG PER TABLET    Take one tablet by mouth every 6 hours as needed for moderate pain. Do not exceed 4gm of Tylenol in 24 hours   INSULIN ASPART (NOVOLOG) 100 UNIT/ML INJECTION    Inject 0-15 Units into the skin 3 (three) times daily with meals.   INSULIN DETEMIR (LEVEMIR) 100 UNIT/ML INJECTION    Inject 0.1 mLs (10 Units total) into the skin daily at 10 pm.   MEMANTINE (NAMENDA) 10 MG TABLET    Take 1 tablet (10 mg total) by mouth 2 (two) times daily.   MULTIPLE VITAMIN (MULTIVITAMIN WITH MINERALS) TABS TABLET    Take 1 tablet by mouth daily.   ONDANSETRON (ZOFRAN) 4 MG TABLET    Take 4 mg by mouth every 8 (eight) hours as needed for nausea or vomiting.    SIMVASTATIN (ZOCOR) 40 MG TABLET    Take 40 mg by mouth every morning.   Modified Medications   No medications on file  Discontinued Medications   No medications on file     Physical Exam: Filed Vitals:   04/12/15 1633  BP: 134/78  Pulse: 89  Temp: 99 F (37.2 C)  Resp: 18  SpO2: 97%    General- elderly male, well built, in no acute distress Head- normocephalic, atraumatic Throat- moist mucus membrane Eyes- PERRLA, EOMI, no pallor, no icterus, no discharge, normal conjunctiva, normal sclera Neck- no cervical lymphadenopathy Cardiovascular- normal s1,s2, no murmurs, palpable dorsalis pedis and radial pulses, trace right and 1+ left leg edema Respiratory- bilateral clear to auscultation, no wheeze, no rhonchi, no crackles, no use of accessory muscles Abdomen- bowel sounds present, soft, non tender Musculoskeletal- able to move all 4 extremities Neurological- no focal deficit Skin- warm and dry Psychiatry- alert and oriented to person, normal mood and  affect    Labs reviewed: Basic Metabolic Panel:  Recent Labs  04/07/15 0930 04/08/15 0233 04/09/15 0350  NA 137 139 137  K 4.1 3.5 3.6  CL 105 108 107  CO2 27 24 26   GLUCOSE 77 141* 126*  BUN 17 15 14   CREATININE 1.49* 1.21 1.23  CALCIUM 8.4* 8.0* 8.1*   Liver Function Tests:  Recent Labs  03/08/15 1816 04/01/15 1530 04/07/15 0930  AST 27 28 36  ALT 17 17 25   ALKPHOS 83 79 69  BILITOT 0.7 1.1 1.5*  PROT 7.5 7.6 6.8  ALBUMIN 3.6 3.5 3.0*  Recent Labs  08/23/14 1616 04/01/15 1530 04/07/15 0930  LIPASE 15 33 14*   No results for input(s): AMMONIA in the last 8760 hours. CBC:  Recent Labs  03/08/15 1816 04/01/15 1530  04/07/15 0930 04/08/15 0233 04/09/15 0350 04/10/15 0506  WBC 8.5 10.7*  < > 10.8* 9.1 11.4* 11.8*  NEUTROABS 7.5 7.8*  --  8.2*  --   --   --   HGB 13.7 14.0  < > 11.4* 10.5* 10.9* 10.9*  HCT 41.2 41.5  < > 34.7* 31.6* 32.8* 32.7*  MCV 81.3 82.2  < > 82.2 80.4 81.0 81.3  PLT 189 199  < > 282 257 307 358  < > = values in this interval not displayed. Cardiac Enzymes:  Recent Labs  04/01/15 1530 04/07/15 2110 04/08/15 0830  TROPONINI 0.03 0.37* 0.27*   BNP: Invalid input(s): POCBNP CBG:  Recent Labs  04/10/15 0739 04/10/15 1247 04/10/15 1434  GLUCAP 101* 121* 103*    Radiological Exams:  Dg Chest 2 View  04/07/2015   CLINICAL DATA:  Left-sided chest pain and cough for 1 week  EXAM: CHEST  2 VIEW  COMPARISON:  April 01, 2015  FINDINGS: There is a nipple shadow on the right. There is no edema or consolidation. Heart size is upper normal with pulmonary vascularity within normal limits. No adenopathy. There is degenerative change in the thoracic spine.  IMPRESSION: No edema or consolidation.   Electronically Signed   By: Lowella Grip III M.D.   On: 04/07/2015 10:05   Ct Angio Chest Pe W/cm &/or Wo Cm  04/07/2015   CLINICAL DATA:  Chest pain, shortness of breath since yesterday.  EXAM: CT ANGIOGRAPHY CHEST WITH CONTRAST   TECHNIQUE: Multidetector CT imaging of the chest was performed using the standard protocol during bolus administration of intravenous contrast. Multiplanar CT image reconstructions and MIPs were obtained to evaluate the vascular anatomy.  CONTRAST:  16mL OMNIPAQUE IOHEXOL 350 MG/ML SOLN  COMPARISON:  None.  FINDINGS: Chest wall: No chest wall mass, supraclavicular or axillary lymphadenopathy. The left thyroid lobe contains a 15 mm nodule posteriorly. Ultrasound followup is suggested. The bony thorax is intact. No acute bony findings or destructive bony changes. Advanced degenerative changes involving the thoracic spine and sternoclavicular joints.  Mediastinum: The heart is borderline in size. No pericardial effusion. The aorta is normal in caliber. Moderate atherosclerotic calcifications. No dissection. There are three-vessel coronary artery calcifications. No mediastinal or hilar mass or adenopathy. The esophagus is grossly normal.  Pulmonary arteries: The main pulmonary artery is mildly enlarged. The left pulmonary artery is completely occluded by clot. There is also subsegmental pulmonary emboli on the right side. Findings of right heart strain with RV LV ratio at greater than 1 (1.2).  Lungs/pleura: There is a small left pleural effusion and patchy areas of bibasilar atelectasis. No worrisome pulmonary lesions. No pulmonary edema.  Upper abdomen:  No significant findings.  Review of the MIP images confirms the above findings.  IMPRESSION: Positive for acute PE with CT evidence of right heart strain (RV/LV Ratio = 1.2) consistent with at least submassive (intermediate risk) PE. The presence of right heart strain has been associated with an increased risk of morbidity and mortality. Please activate Code PE by paging [phone].  These results were called by telephone at the time of interpretation on 04/07/2015 at 2:35 pm to Dr. Debby Freiberg , who verbally acknowledged these results.   Electronically Signed    By: Ricky Stabs.D.  On: 04/07/2015 14:35   Dg Chest Port 1 View  04/08/2015   CLINICAL DATA:  Known pulmonary emboli. Shortness of breath. Subsequent encounter.  EXAM: PORTABLE CHEST - 1 VIEW  COMPARISON:  Chest radiograph and CTA of the chest performed 04/07/2015  FINDINGS: The lungs are well-aerated. Patchy right-sided atelectasis is again noted. A small left pleural effusion is again seen. Mild vascular congestion is noted. No pneumothorax is seen.  The cardiomediastinal silhouette is borderline enlarged. No acute osseous abnormalities are seen.  IMPRESSION: Patchy right-sided atelectasis again noted. Small left pleural effusion again seen. Mild vascular congestion and borderline cardiomegaly.   Electronically Signed   By: Garald Balding M.D.   On: 04/08/2015 06:33   Dg Chest Portable 1 View  04/01/2015   CLINICAL DATA:  Shortness of breath, weakness  EXAM: PORTABLE CHEST - 1 VIEW  COMPARISON:  08/23/2014  FINDINGS: Cardiac leads obscure detail. Right AC joint degenerative change. Heart size is mildly enlarged without evidence for edema. No focal pulmonary opacity. No pleural effusion.  IMPRESSION: Mild cardiomegaly without focal acute finding.   Electronically Signed   By: Conchita Paris M.D.   On: 04/01/2015 18:39   Ct Renal Stone Study  04/01/2015   CLINICAL DATA:  81 year old male with nausea abdominal pain, abdominal distention, shortness of breath, weakness. Initial encounter.  EXAM: CT ABDOMEN AND PELVIS WITHOUT CONTRAST  TECHNIQUE: Multidetector CT imaging of the abdomen and pelvis was performed following the standard protocol without IV contrast.  COMPARISON:  CT Abdomen and Pelvis 08/23/2014.  FINDINGS: Mild motion artifact at the lung bases. Stable cardiomegaly. No pericardial effusion. Stable scarring or atelectasis in the right costophrenic angle.  Advanced degenerative changes in the spine with bulky osteophytosis. Multilevel lumbar spinal stenosis. No acute osseous abnormality  identified.  Sequelae of prostatectomy. No pelvic free fluid. Diminutive bladder. Negative rectum. Decompressed small bowel loops in the pelvis.  Diverticulosis of the sigmoid colon with no active inflammation identified. Negative left colon aside from retained stool. Redundant transverse colon with retained stool. Negative right colon. Normal appendix. Negative terminal ileum. No dilated small bowel small hiatal hernia. Decompressed stomach. Negative duodenum.  Noncontrast liver with chronic left lateral lobe hemangioma which is better demonstrated on the prior contrast-enhanced study. Negative noncontrast CT appearance of the gallbladder. Noncontrast spleen, pancreas and adrenal glands are within normal limits. No hydronephrosis. Chronic left renal cysts. No nephrolithiasis. No hydroureter or ureteral calculus. No abdominal free fluid. Aortoiliac calcified atherosclerosis noted. No lymphadenopathy identified. Small fat containing inguinal hernias are stable.  IMPRESSION: 1. No urologic calculi. No acute or inflammatory process identified in the abdomen or pelvis. 2. Chronic findings including sigmoid diverticulosis, Aortoiliac calcified atherosclerosis, Advanced spine degeneration with lumbar spinal stenosis.   Electronically Signed   By: Genevie Ann M.D.   On: 04/01/2015 18:56     Assessment/Plan  Physical deconditioning Will have him work with physical therapy and occupational therapy team to help with gait training and muscle strengthening exercises.fall precautions. Skin care. Encourage to be out of bed.   Pulmonary embolism continue Eliquis 10 mg bid for now until 04/15/15 and then start 5 mg bid. Breathing stable  dvt Continue eliquis for now and lasix three times a week for edema  Leukocytosis No signs of infection on exam, afebrile, monitor clinically  Diabetes mellitus, type II  hemoglobin A1c 7.3. continue glipizide 10 mg bid, Levemir 10 units daily at bedtime and Humalog sliding scale 3  times a day with meals. Monitor cbg  Constipation continue Colace 200 mg bid and prn dulcolax  Dementia  No behavioral disturbance, continue Namenda 10 mg bid  Hyperlipidemia continue Zocor 40 mg daily  GERD continue prilosec 20 mg bid- formulary in place of nexium 40 mg bid with home regimen famotidine    Goals of care: short term rehabilitation   Labs/tests ordered: cbc  Family/ staff Communication: reviewed care plan with patient and nursing supervisor    Blanchie Serve, MD  [facility] [phone] (Monday-Friday 8 am - 5 pm) [phone] (afterhours)

## 2015-04-12 NOTE — Telephone Encounter (Signed)
[facility] 

## 2015-04-15 ENCOUNTER — Telehealth: Payer: Self-pay | Admitting: Oncology

## 2015-04-15 NOTE — Telephone Encounter (Signed)
Patient dtr called to r/s np appt for 07/20 @ 1:30 w/Dr. Alen Blew

## 2015-04-21 ENCOUNTER — Ambulatory Visit (INDEPENDENT_AMBULATORY_CARE_PROVIDER_SITE_OTHER): Payer: Medicare PPO | Admitting: Podiatry

## 2015-04-21 DIAGNOSIS — E1151 Type 2 diabetes mellitus with diabetic peripheral angiopathy without gangrene: Secondary | ICD-10-CM | POA: Diagnosis not present

## 2015-04-21 DIAGNOSIS — B351 Tinea unguium: Secondary | ICD-10-CM | POA: Diagnosis not present

## 2015-04-21 DIAGNOSIS — M79673 Pain in unspecified foot: Secondary | ICD-10-CM | POA: Diagnosis not present

## 2015-04-21 NOTE — Progress Notes (Signed)
Patient ID: Marvin RUDDICK Sr., male   DOB: 1920/05/25, 81 y.o.   MRN: [id number] HPI  Complaint:  Visit Type: Patient returns to my office for continued preventative foot care services. Complaint: Patient states" my nails have grown long and thick and become painful to walk and wear shoes" Patient has been diagnosed with DM with vascular complications. He presents for preventative foot care services. No changes to ROS  Podiatric Exam: Vascular: dorsalis pedis and posterior tibial pulses are negative. Capillary return is immediate. Temperature gradient is negative. Skin turgor WNL Sensorium: Diminshed Semmes Weinstein monofilament test. Normal tactile sensation bilaterally.  Nail Exam: Pt has thick disfigured discolored nails with subungual debris noted bilateral entire nail hallux through fifth toenails Ulcer Exam: There is no evidence of ulcer or pre-ulcerative changes or infection. Orthopedic Exam: Muscle tone and strength are WNL. No limitations in general ROM. No crepitus or effusions noted. Foot type and digits show no abnormalities. Bony prominences are unremarkable. Skin: No Porokeratosis. No infection or ulcers  Diagnosis:  Tinea unguium, Pain in right toe, pain in left toes  Treatment & Plan Procedures and Treatment: Consent by patient was obtained for treatment procedures. The patient understood the discussion of treatment and procedures well. All questions were answered thoroughly reviewed. Debridement of mycotic and hypertrophic toenails, 1 through 5 bilateral and clearing of subungual debris. No ulceration, no infection noted.  Return Visit-Office Procedure: Patient instructed to return to the office for a follow up visit 3 months for continued evaluation and treatment.

## 2015-04-27 ENCOUNTER — Non-Acute Institutional Stay: Payer: Medicare PPO | Admitting: Adult Health

## 2015-04-27 ENCOUNTER — Encounter: Payer: Self-pay | Admitting: Adult Health

## 2015-04-27 DIAGNOSIS — I82403 Acute embolism and thrombosis of unspecified deep veins of lower extremity, bilateral: Secondary | ICD-10-CM | POA: Diagnosis not present

## 2015-04-27 DIAGNOSIS — R600 Localized edema: Secondary | ICD-10-CM

## 2015-04-27 DIAGNOSIS — E118 Type 2 diabetes mellitus with unspecified complications: Secondary | ICD-10-CM | POA: Diagnosis not present

## 2015-04-27 DIAGNOSIS — R5381 Other malaise: Secondary | ICD-10-CM | POA: Diagnosis not present

## 2015-04-27 DIAGNOSIS — I2699 Other pulmonary embolism without acute cor pulmonale: Secondary | ICD-10-CM | POA: Diagnosis not present

## 2015-04-27 DIAGNOSIS — K219 Gastro-esophageal reflux disease without esophagitis: Secondary | ICD-10-CM | POA: Diagnosis not present

## 2015-04-27 DIAGNOSIS — E785 Hyperlipidemia, unspecified: Secondary | ICD-10-CM

## 2015-04-27 DIAGNOSIS — F0390 Unspecified dementia without behavioral disturbance: Secondary | ICD-10-CM | POA: Diagnosis not present

## 2015-04-27 NOTE — Progress Notes (Signed)
Patient ID: Marvin GENOVA Sr., male   DOB: 1919-10-17, 81 y.o.   MRN: [id number]   04/27/2015  Facility:  [facility]urs[facility] Home Location:  [place] Room Number: 702-P LEVEL OF CARE:  SNF (31)   Chief Complaint  Patient presents with  . Discharge Note    Physical deconditioning, pulmonary embolism, diabetes mellitus, constipation, GERD, bilateral lower extremity edema, dementia and hyperlipidemia    HISTORY OF PRESENT ILLNESS:  This is a 81 year old male who is for discharge home with Home health PT and OT. He has been admitted to [facility] on 04/10/15. He has PMH of arthritis, hypertension, hypercholesterolemia, insomnia, advanced dementia, asthma, peripheral neuropathy, prostate cancer and diabetes mellitus. He was recently hospitalized. Complaints of abdominal pain. Work up was negative so he was discharged on 04/04/15. He presented to ED, this time, complaints of chest pain. Troponin and d-dimer were elevated so CT chest was done. CT chest was positive for PE. He had heparin drip then transitioned to Eliquis on 6/25.  Patient was admitted to this facility for short-term rehabilitation after the patient's recent hospitalization.  Patient has completed SNF rehabilitation and therapy has cleared the patient for discharge.  PAST MEDICAL HISTORY:  Past Medical History  Diagnosis Date  . Arthritis   . Hypertension   . Hypercholesteremia   . Insomnia     occasional  . Nephrolithiasis     right UVJ stone  . Dementia   . Memory loss   . Headache(784.0)     slight  . Asthma     hx of as child  . PONV (postoperative nausea and vomiting)   . Complete loss of vision     right eye  . Peripheral neuropathy     both legs  . Prostate cancer   . Diabetes mellitus     CURRENT MEDICATIONS: Reviewed per MAR/see medication list  Allergies  Allergen Reactions  . Donepezil     Medication listed but no reaction stated; family not sure of reaction  . Hydrocodone  Nausea And Vomiting     REVIEW OF SYSTEMS:  GENERAL: no change in appetite, no fatigue, no weight changes, no fever, chills or weakness RESPIRATORY: no cough, SOB, DOE, wheezing, hemoptysis CARDIAC: no chest pain, or palpitations GI: no abdominal pain, diarrhea, constipation, heart burn, nausea or vomiting  PHYSICAL EXAMINATION  GENERAL: no acute distress, overwieght NECK: supple, trachea midline, no neck masses, no thyroid tenderness, no thyromegaly LYMPHATICS: no LAN in the neck, no supraclavicular LAN RESPIRATORY: breathing is even & unlabored, BS CTAB CARDIAC: RRR, no murmur,no extra heart sounds, BLE edema 2+ GI: abdomen soft, normal BS, no masses, no tenderness, no hepatomegaly, no splenomegaly EXTREMITIES: Able to move 4 extremities PSYCHIATRIC: the patient is alert & oriented to person, affect & behavior appropriate  LABS/RADIOLOGY: Labs reviewed: 04/20/15  WBC 7.2 hemoglobin 11.1 hematocrit 34.1 MCV 79.9 date the 510 04/19/15  chest x-ray shows suggestion of mild central vascular congestion which may represent mild or early CHF 04/12/15  WBC 11.4 hemoglobin 10.5 hematocrit 32.1 MCV 79.3 platelet 421 sodium 141 potassium 3.6 glucose 64 BUN 14 creatinine 1.17 calcium 8.2 hemoglobin A1c 8.0 Basic Metabolic Panel:  Recent Labs  04/07/15 0930 04/08/15 0233 04/09/15 0350  NA 137 139 137  K 4.1 3.5 3.6  CL 105 108 107  CO2 27 24 26   GLUCOSE 77 141* 126*  BUN 17 15 14   CREATININE 1.49* 1.21 1.23  CALCIUM 8.4* 8.0* 8.1*  Liver Function Tests:  Recent Labs  03/08/15 1816 04/01/15 1530 04/07/15 0930  AST 27 28 36  ALT 17 17 25   ALKPHOS 83 79 69  BILITOT 0.7 1.1 1.5*  PROT 7.5 7.6 6.8  ALBUMIN 3.6 3.5 3.0*    Recent Labs  08/23/14 1616 04/01/15 1530 04/07/15 0930  LIPASE 15 33 14*   No results for input(s): AMMONIA in the last 8760 hours. CBC:  Recent Labs  03/08/15 1816 04/01/15 1530  04/07/15 0930 04/08/15 0233 04/09/15 0350 04/10/15 0506  WBC  8.5 10.7*  < > 10.8* 9.1 11.4* 11.8*  NEUTROABS 7.5 7.8*  --  8.2*  --   --   --   HGB 13.7 14.0  < > 11.4* 10.5* 10.9* 10.9*  HCT 41.2 41.5  < > 34.7* 31.6* 32.8* 32.7*  MCV 81.3 82.2  < > 82.2 80.4 81.0 81.3  PLT 189 199  < > 282 257 307 358  < > = values in this interval not displayed.  Cardiac Enzymes:  Recent Labs  04/01/15 1530 04/07/15 2110 04/08/15 0830  TROPONINI 0.03 0.37* 0.27*   CBG:  Recent Labs  04/10/15 0739 04/10/15 1247 04/10/15 1434  GLUCAP 101* 121* 103*    Dg Chest 2 View  04/07/2015   CLINICAL DATA:  Left-sided chest pain and cough for 1 week  EXAM: CHEST  2 VIEW  COMPARISON:  April 01, 2015  FINDINGS: There is a nipple shadow on the right. There is no edema or consolidation. Heart size is upper normal with pulmonary vascularity within normal limits. No adenopathy. There is degenerative change in the thoracic spine.  IMPRESSION: No edema or consolidation.   Electronically Signed   By: Lowella Grip III M.D.   On: 04/07/2015 10:05   Ct Angio Chest Pe W/cm &/or Wo Cm  04/07/2015   CLINICAL DATA:  Chest pain, shortness of breath since yesterday.  EXAM: CT ANGIOGRAPHY CHEST WITH CONTRAST  TECHNIQUE: Multidetector CT imaging of the chest was performed using the standard protocol during bolus administration of intravenous contrast. Multiplanar CT image reconstructions and MIPs were obtained to evaluate the vascular anatomy.  CONTRAST:  172mL OMNIPAQUE IOHEXOL 350 MG/ML SOLN  COMPARISON:  None.  FINDINGS: Chest wall: No chest wall mass, supraclavicular or axillary lymphadenopathy. The left thyroid lobe contains a 15 mm nodule posteriorly. Ultrasound followup is suggested. The bony thorax is intact. No acute bony findings or destructive bony changes. Advanced degenerative changes involving the thoracic spine and sternoclavicular joints.  Mediastinum: The heart is borderline in size. No pericardial effusion. The aorta is normal in caliber. Moderate atherosclerotic  calcifications. No dissection. There are three-vessel coronary artery calcifications. No mediastinal or hilar mass or adenopathy. The esophagus is grossly normal.  Pulmonary arteries: The main pulmonary artery is mildly enlarged. The left pulmonary artery is completely occluded by clot. There is also subsegmental pulmonary emboli on the right side. Findings of right heart strain with RV LV ratio at greater than 1 (1.2).  Lungs/pleura: There is a small left pleural effusion and patchy areas of bibasilar atelectasis. No worrisome pulmonary lesions. No pulmonary edema.  Upper abdomen:  No significant findings.  Review of the MIP images confirms the above findings.  IMPRESSION: Positive for acute PE with CT evidence of right heart strain (RV/LV Ratio = 1.2) consistent with at least submassive (intermediate risk) PE. The presence of right heart strain has been associated with an increased risk of morbidity and mortality. Please activate Code PE by paging  [phone].  These results were called by telephone at the time of interpretation on 04/07/2015 at 2:35 pm to Dr. Debby Freiberg , who verbally acknowledged these results.   Electronically Signed   By: Marijo Sanes M.D.   On: 04/07/2015 14:35   Dg Chest Port 1 View  04/08/2015   CLINICAL DATA:  Known pulmonary emboli. Shortness of breath. Subsequent encounter.  EXAM: PORTABLE CHEST - 1 VIEW  COMPARISON:  Chest radiograph and CTA of the chest performed 04/07/2015  FINDINGS: The lungs are well-aerated. Patchy right-sided atelectasis is again noted. A small left pleural effusion is again seen. Mild vascular congestion is noted. No pneumothorax is seen.  The cardiomediastinal silhouette is borderline enlarged. No acute osseous abnormalities are seen.  IMPRESSION: Patchy right-sided atelectasis again noted. Small left pleural effusion again seen. Mild vascular congestion and borderline cardiomegaly.   Electronically Signed   By: Garald Balding M.D.   On: 04/08/2015 06:33    Dg Chest Portable 1 View  04/01/2015   CLINICAL DATA:  Shortness of breath, weakness  EXAM: PORTABLE CHEST - 1 VIEW  COMPARISON:  08/23/2014  FINDINGS: Cardiac leads obscure detail. Right AC joint degenerative change. Heart size is mildly enlarged without evidence for edema. No focal pulmonary opacity. No pleural effusion.  IMPRESSION: Mild cardiomegaly without focal acute finding.   Electronically Signed   By: Conchita Paris M.D.   On: 04/01/2015 18:39   Ct Renal Stone Study  04/01/2015   CLINICAL DATA:  81 year old male with nausea abdominal pain, abdominal distention, shortness of breath, weakness. Initial encounter.  EXAM: CT ABDOMEN AND PELVIS WITHOUT CONTRAST  TECHNIQUE: Multidetector CT imaging of the abdomen and pelvis was performed following the standard protocol without IV contrast.  COMPARISON:  CT Abdomen and Pelvis 08/23/2014.  FINDINGS: Mild motion artifact at the lung bases. Stable cardiomegaly. No pericardial effusion. Stable scarring or atelectasis in the right costophrenic angle.  Advanced degenerative changes in the spine with bulky osteophytosis. Multilevel lumbar spinal stenosis. No acute osseous abnormality identified.  Sequelae of prostatectomy. No pelvic free fluid. Diminutive bladder. Negative rectum. Decompressed small bowel loops in the pelvis.  Diverticulosis of the sigmoid colon with no active inflammation identified. Negative left colon aside from retained stool. Redundant transverse colon with retained stool. Negative right colon. Normal appendix. Negative terminal ileum. No dilated small bowel small hiatal hernia. Decompressed stomach. Negative duodenum.  Noncontrast liver with chronic left lateral lobe hemangioma which is better demonstrated on the prior contrast-enhanced study. Negative noncontrast CT appearance of the gallbladder. Noncontrast spleen, pancreas and adrenal glands are within normal limits. No hydronephrosis. Chronic left renal cysts. No nephrolithiasis. No  hydroureter or ureteral calculus. No abdominal free fluid. Aortoiliac calcified atherosclerosis noted. No lymphadenopathy identified. Small fat containing inguinal hernias are stable.  IMPRESSION: 1. No urologic calculi. No acute or inflammatory process identified in the abdomen or pelvis. 2. Chronic findings including sigmoid diverticulosis, Aortoiliac calcified atherosclerosis, Advanced spine degeneration with lumbar spinal stenosis.   Electronically Signed   By: Genevie Ann M.D.   On: 04/01/2015 18:56    ASSESSMENT/PLAN:  Physical deconditioning - for home health PT and OT  Pulmonary embolism - continue Eliquis 5 mg by mouth twice a day   Diabetes mellitus, type II - hemoglobin A1c 8.0; continue glipizide 10 mg 1 tab by mouth twice a day, Levemir 10 units subcutaneous daily at bedtime and Humalog sliding scale 3 times a day with meals  Constipation - continue Senokot S2 tabs by mouth daily  and MiraLAX 17 g +4-6 ounces liquid by mouth daily   GERD - continue Prilosec 20 mg 1 tab by mouth twice a day  Bilateral lower extremity edema - continue Lasix 20 mg on Mondays-Wednesdays-Fridays  Dementia - continue Namenda 10 mg by mouth twice a day  Hyperlipidemia - continue Zocor 40 mg by mouth every a.m.    I have filled out patient's discharge paperwork and written prescriptions.  Patient will receive home health PT and OT.  Total discharge time: Less than 30 minutes  Discharge time involved coordination of the discharge process with Education officer, museum, nursing staff and therapy department. Medical justification for home health services verified.    [facility], NP [employer] [phone]

## 2015-05-03 DIAGNOSIS — I10 Essential (primary) hypertension: Secondary | ICD-10-CM | POA: Diagnosis not present

## 2015-05-03 DIAGNOSIS — M150 Primary generalized (osteo)arthritis: Secondary | ICD-10-CM | POA: Diagnosis not present

## 2015-05-03 DIAGNOSIS — N183 Chronic kidney disease, stage 3 (moderate): Secondary | ICD-10-CM | POA: Diagnosis not present

## 2015-05-03 DIAGNOSIS — Z794 Long term (current) use of insulin: Secondary | ICD-10-CM | POA: Diagnosis not present

## 2015-05-03 DIAGNOSIS — I82402 Acute embolism and thrombosis of unspecified deep veins of left lower extremity: Secondary | ICD-10-CM | POA: Diagnosis not present

## 2015-05-03 DIAGNOSIS — F0390 Unspecified dementia without behavioral disturbance: Secondary | ICD-10-CM | POA: Diagnosis not present

## 2015-05-03 DIAGNOSIS — K219 Gastro-esophageal reflux disease without esophagitis: Secondary | ICD-10-CM | POA: Diagnosis not present

## 2015-05-03 DIAGNOSIS — E1142 Type 2 diabetes mellitus with diabetic polyneuropathy: Secondary | ICD-10-CM | POA: Diagnosis not present

## 2015-05-03 DIAGNOSIS — I2699 Other pulmonary embolism without acute cor pulmonale: Secondary | ICD-10-CM | POA: Diagnosis not present

## 2015-05-03 DIAGNOSIS — E1140 Type 2 diabetes mellitus with diabetic neuropathy, unspecified: Secondary | ICD-10-CM | POA: Diagnosis not present

## 2015-05-03 DIAGNOSIS — I82401 Acute embolism and thrombosis of unspecified deep veins of right lower extremity: Secondary | ICD-10-CM | POA: Diagnosis not present

## 2015-05-03 DIAGNOSIS — E785 Hyperlipidemia, unspecified: Secondary | ICD-10-CM | POA: Diagnosis not present

## 2015-05-04 ENCOUNTER — Encounter: Payer: Self-pay | Admitting: Oncology

## 2015-05-04 ENCOUNTER — Other Ambulatory Visit: Payer: Self-pay

## 2015-05-04 ENCOUNTER — Ambulatory Visit (HOSPITAL_BASED_OUTPATIENT_CLINIC_OR_DEPARTMENT_OTHER): Payer: Medicare PPO | Admitting: Oncology

## 2015-05-04 ENCOUNTER — Ambulatory Visit: Payer: Medicare PPO

## 2015-05-04 ENCOUNTER — Telehealth: Payer: Self-pay | Admitting: Oncology

## 2015-05-04 VITALS — BP 128/71 | HR 118 | Temp 98.1°F | Resp 18 | Ht 72.0 in | Wt 201.3 lb

## 2015-05-04 DIAGNOSIS — D472 Monoclonal gammopathy: Secondary | ICD-10-CM

## 2015-05-04 DIAGNOSIS — N189 Chronic kidney disease, unspecified: Secondary | ICD-10-CM | POA: Diagnosis not present

## 2015-05-04 DIAGNOSIS — E8809 Other disorders of plasma-protein metabolism, not elsewhere classified: Secondary | ICD-10-CM

## 2015-05-04 DIAGNOSIS — E119 Type 2 diabetes mellitus without complications: Secondary | ICD-10-CM

## 2015-05-04 DIAGNOSIS — I2699 Other pulmonary embolism without acute cor pulmonale: Secondary | ICD-10-CM | POA: Diagnosis not present

## 2015-05-04 DIAGNOSIS — I82409 Acute embolism and thrombosis of unspecified deep veins of unspecified lower extremity: Secondary | ICD-10-CM

## 2015-05-04 DIAGNOSIS — I10 Essential (primary) hypertension: Secondary | ICD-10-CM

## 2015-05-04 NOTE — Progress Notes (Signed)
Please see consult note.  

## 2015-05-04 NOTE — Progress Notes (Signed)
Checked in new pt with no financial concerns.  Pt has my card for any billing questions, concerns or if financial assistance is needed.  ° °

## 2015-05-04 NOTE — Consult Note (Signed)
Reason for Referral: Evaluation for a possible plasma cell disorder.   HPI: 81 year old gentleman with significant comorbid conditions that includes hypertension, diabetes and dementia. He also has chronic renal insufficiency and followed by Dr. Florene Glen from nephrology. He was evaluated recently and had a free light chains in the serum there were elevated. His Light chain were 69 with  ratio was 2.68 with the upper limit of normal 1.65. He is a creatinine was around 1.39 with creatinine clearance of 51 mL/m. He is asymptomatic from this finding without any evidence of constitutional symptoms. He was hospitalized in June 2016 for dehydration and failure to thrive and after a short discharge he was admitted again and found to have deep vein thrombosis and pulmonary emboli. He was started on anticoagulation and have done well since his discharge. He has not reported any pathological fractures, opportunistic infections or peripheral neuropathy. His kidney function remained stable last few years.  He does not report any headaches, blurry vision, syncope or seizures. He does not report any fevers or chills or sweats. He does not report any cough or hemoptysis or hematemesis. Does not report any nausea, vomiting or abdominal pain. Does not report any constipation, diarrhea or hematochezia. He does not report any frequency urgency or hesitancy. He does not report any skeletal complaints. Remainder review of systems unremarkable.   Past Medical History  Diagnosis Date  . Arthritis   . Hypertension   . Hypercholesteremia   . Insomnia     occasional  . Nephrolithiasis     right UVJ stone  . Dementia   . Memory loss   . Headache(784.0)     slight  . Asthma     hx of as child  . PONV (postoperative nausea and vomiting)   . Complete loss of vision     right eye  . Peripheral neuropathy     both legs  . Prostate cancer   . Diabetes mellitus   :  Past Surgical History  Procedure Laterality Date  .  Appendectomy    . Pars plana vitrectomy  10/24/2011    Procedure: PARS PLANA VITRECTOMY WITH 23 GAUGE;  Surgeon: Adonis Brook, MD;  Location: [employer];  Service: Ophthalmology;  Laterality: Right;  . Tonsillectomy  as teen  . Knee arthroscopy Right   . Hemorroidectomy    . Cystoscopy w/ ureteral stent placement Right 10/01/2013    Procedure: cystoscopy right retrograde holmium laser lithotripsy;  Surgeon: Franchot Gallo, MD;  Location: [facility];  Service: Urology;  Laterality: Right;  holmium laser application  . Cornea removed Right 02-26-15  :   Current outpatient prescriptions:  .  acetaminophen (TYLENOL) 325 MG tablet, Take 2 tablets (650 mg total) by mouth every 6 (six) hours as needed for mild pain (or Fever >/= 101)., Disp: , Rfl:  .  apixaban (ELIQUIS) 5 MG TABS tablet, use 10 mg oral twice a day for next 6 days till 7/1 , then decrease to 5 mg oral twice a day thereafter, Disp: 60 tablet, Rfl:  .  B-D INS SYRINGE 0.5CC/30GX1/2" 30G X 1/2" 0.5 ML MISC, See admin instructions., Disp: , Rfl: 0 .  BD PEN NEEDLE NANO U/F 32G X 4 MM MISC, See admin instructions., Disp: , Rfl: 4 .  bisacodyl (DULCOLAX) 10 MG suppository, Place 1 suppository (10 mg total) rectally daily as needed for moderate constipation., Disp: 12 suppository, Rfl: 0 .  docusate sodium 100 MG CAPS, Take 200 mg by mouth 2 (two) times daily  as needed for mild constipation., Disp: 25 capsule, Rfl: 0 .  esomeprazole (NEXIUM) 40 MG capsule, Take 1 capsule (40 mg total) by mouth 2 (two) times daily before a meal., Disp: 60 capsule, Rfl: 1 .  famotidine (PEPCID) 20 MG tablet, Take 1 tablet (20 mg total) by mouth at bedtime., Disp: 30 tablet, Rfl: 1 .  furosemide (LASIX) 20 MG tablet, Take 20 mg by mouth See admin instructions. Take on Mondays Wednesday and Friday, Disp: , Rfl:  .  glipiZIDE (GLUCOTROL) 10 MG tablet, Take 10 mg by mouth 2 (two) times daily before a meal. , Disp: , Rfl:  .  HYDROcodone-acetaminophen (NORCO/VICODIN) 5-325  MG per tablet, Take one tablet by mouth every 6 hours as needed for moderate pain. Do not exceed 4gm of Tylenol in 24 hours, Disp: 120 tablet, Rfl: 0 .  insulin aspart (NOVOLOG) 100 UNIT/ML injection, Inject 0-15 Units into the skin 3 (three) times daily with meals., Disp: 10 mL, Rfl: 11 .  insulin detemir (LEVEMIR) 100 UNIT/ML injection, Inject 0.1 mLs (10 Units total) into the skin daily at 10 pm., Disp: 10 mL, Rfl: 11 .  memantine (NAMENDA) 10 MG tablet, Take 1 tablet (10 mg total) by mouth 2 (two) times daily., Disp: 60 tablet, Rfl: 6 .  Multiple Vitamin (MULTIVITAMIN WITH MINERALS) TABS tablet, Take 1 tablet by mouth daily., Disp: , Rfl:  .  ondansetron (ZOFRAN) 4 MG tablet, Take 4 mg by mouth every 8 (eight) hours as needed for nausea or vomiting. , Disp: , Rfl: 0 .  polyethylene glycol (MIRALAX / GLYCOLAX) packet, Take 1 packet by mouth daily., Disp: , Rfl: 6 .  simvastatin (ZOCOR) 40 MG tablet, Take 40 mg by mouth every morning. , Disp: , Rfl:  .  TRADJENTA 5 MG TABS tablet, Take 5 mg by mouth every morning., Disp: , Rfl: 6:  Allergies  Allergen Reactions  . Donepezil Nausea And Vomiting  . Hydrocodone Nausea And Vomiting  :  Family History  Problem Relation Age of Onset  . Cancer Mother     uterus  . Heart Problems Father   . Cancer Brother     stomach  . Cancer Brother     throat  . Colon cancer Brother   :  History   Social History  . Marital Status: Widowed    Spouse Name: N/A  . Number of Children: 5  . Years of Education: 6 th   Occupational History  .      Retired   Social History Main Topics  . Smoking status: Former Smoker -- 0.25 packs/day    Types: Cigarettes    Quit date: 10/15/1966  . Smokeless tobacco: Never Used  . Alcohol Use: 0.0 oz/week     Comment: occasional  . Drug Use: No  . Sexual Activity: No   Other Topics Concern  . Not on file   Social History Narrative   Patient lives at home alone with dog.   Retired.   Education.- 6 th   Grade   Right handed.   Caffeine-Soda , tea and coffee              :  Pertinent items are noted in HPI.  Exam: Blood pressure 128/71, pulse 118, temperature 98.1 F (36.7 C), temperature source Oral, resp. rate 18, height 6' (1.829 m), weight 201 lb 4.8 oz (91.309 kg), SpO2 100 %. General appearance: alert and cooperative Head: Normocephalic, without obvious abnormality Nose: Nares normal. Septum midline. Mucosa  normal. No drainage or sinus tenderness. Throat: lips, mucosa, and tongue normal; teeth and gums normal Neck: no adenopathy Back: negative Resp: clear to auscultation bilaterally Chest wall: no tenderness Cardio: regular rate and rhythm, S1, S2 normal, no murmur, click, rub or gallop GI: soft, non-tender; bowel sounds normal; no masses,  no organomegaly Extremities: extremities normal, atraumatic, no cyanosis or edema Pulses: 2+ and symmetric  CBC    Component Value Date/Time   WBC 11.8* 04/10/2015 0506   RBC 4.02* 04/10/2015 0506   HGB 10.9* 04/10/2015 0506   HCT 32.7* 04/10/2015 0506   PLT 358 04/10/2015 0506   MCV 81.3 04/10/2015 0506   MCH 27.1 04/10/2015 0506   MCHC 33.3 04/10/2015 0506   RDW 14.4 04/10/2015 0506   LYMPHSABS 0.9 04/07/2015 0930   MONOABS 1.5* 04/07/2015 0930   EOSABS 0.2 04/07/2015 0930   BASOSABS 0.0 04/07/2015 0930    '   Chemistry      Component Value Date/Time   NA 137 04/09/2015 0350   K 3.6 04/09/2015 0350   CL 107 04/09/2015 0350   CO2 26 04/09/2015 0350   BUN 14 04/09/2015 0350   CREATININE 1.23 04/09/2015 0350      Component Value Date/Time   CALCIUM 8.1* 04/09/2015 0350   ALKPHOS 69 04/07/2015 0930   AST 36 04/07/2015 0930   ALT 25 04/07/2015 0930   BILITOT 1.5* 04/07/2015 0930       Dg Chest 2 View  04/07/2015   CLINICAL DATA:  Left-sided chest pain and cough for 1 week  EXAM: CHEST  2 VIEW  COMPARISON:  April 01, 2015  FINDINGS: There is a nipple shadow on the right. There is no edema or consolidation. Heart  size is upper normal with pulmonary vascularity within normal limits. No adenopathy. There is degenerative change in the thoracic spine.  IMPRESSION: No edema or consolidation.   Electronically Signed   By: Lowella Grip III M.D.   On: 04/07/2015 10:05   Ct Angio Chest Pe W/cm &/or Wo Cm  04/07/2015   CLINICAL DATA:  Chest pain, shortness of breath since yesterday.  EXAM: CT ANGIOGRAPHY CHEST WITH CONTRAST  TECHNIQUE: Multidetector CT imaging of the chest was performed using the standard protocol during bolus administration of intravenous contrast. Multiplanar CT image reconstructions and MIPs were obtained to evaluate the vascular anatomy.  CONTRAST:  190m OMNIPAQUE IOHEXOL 350 MG/ML SOLN  COMPARISON:  None.  FINDINGS: Chest wall: No chest wall mass, supraclavicular or axillary lymphadenopathy. The left thyroid lobe contains a 15 mm nodule posteriorly. Ultrasound followup is suggested. The bony thorax is intact. No acute bony findings or destructive bony changes. Advanced degenerative changes involving the thoracic spine and sternoclavicular joints.  Mediastinum: The heart is borderline in size. No pericardial effusion. The aorta is normal in caliber. Moderate atherosclerotic calcifications. No dissection. There are three-vessel coronary artery calcifications. No mediastinal or hilar mass or adenopathy. The esophagus is grossly normal.  Pulmonary arteries: The main pulmonary artery is mildly enlarged. The left pulmonary artery is completely occluded by clot. There is also subsegmental pulmonary emboli on the right side. Findings of right heart strain with RV LV ratio at greater than 1 (1.2).  Lungs/pleura: There is a small left pleural effusion and patchy areas of bibasilar atelectasis. No worrisome pulmonary lesions. No pulmonary edema.  Upper abdomen:  No significant findings.  Review of the MIP images confirms the above findings.  IMPRESSION: Positive for acute PE with CT evidence of right heart  strain  (RV/LV Ratio = 1.2) consistent with at least submassive (intermediate risk) PE. The presence of right heart strain has been associated with an increased risk of morbidity and mortality. Please activate Code PE by paging [phone].  These results were called by telephone at the time of interpretation on 04/07/2015 at 2:35 pm to Dr. Debby Freiberg , who verbally acknowledged these results.   Electronically Signed   By: Marijo Sanes M.D.   On: 04/07/2015 14:35   Dg Chest Port 1 View  04/08/2015   CLINICAL DATA:  Known pulmonary emboli. Shortness of breath. Subsequent encounter.  EXAM: PORTABLE CHEST - 1 VIEW  COMPARISON:  Chest radiograph and CTA of the chest performed 04/07/2015  FINDINGS: The lungs are well-aerated. Patchy right-sided atelectasis is again noted. A small left pleural effusion is again seen. Mild vascular congestion is noted. No pneumothorax is seen.  The cardiomediastinal silhouette is borderline enlarged. No acute osseous abnormalities are seen.  IMPRESSION: Patchy right-sided atelectasis again noted. Small left pleural effusion again seen. Mild vascular congestion and borderline cardiomegaly.   Electronically Signed   By: Garald Balding M.D.   On: 04/08/2015 06:33    Assessment and Plan:   81 year old gentleman with the following issues:  1. Evaluation for plasma cell disorder after presenting with increased kappa to lambda ratio of 2.68. He apparently has had IgM lambda monoclonal gammopathy in the past although these laboratory testing are not available to me at this time. There is no clear-cut end organ damage at this time. He does have chronic renal insufficiency that is probably related to his hypertension and diabetes. The differential diagnosis was discussed with the patient and his daughter. This could be representative of an MGUS, reactive findings, amyloidosis, and less likely myeloma.  The plan is to continue observation for the time being and repeat protein studies in 6  months to evaluate for any changes. We can consider staging him with skeletal survey and a bone marrow biopsy if there is any changes to suggest plasma cell disorder.  2. The pulmonary embolism: I do not think this is related to his plasma cell disorder. He is currently 4 anticoagulated appropriately.  3. Follow-up: In 6 months sooner if needed to.

## 2015-05-04 NOTE — Telephone Encounter (Signed)
Gave adn printed appt sched and avs for pt for Jan 2017

## 2015-05-05 DIAGNOSIS — E1142 Type 2 diabetes mellitus with diabetic polyneuropathy: Secondary | ICD-10-CM | POA: Diagnosis not present

## 2015-05-05 DIAGNOSIS — I10 Essential (primary) hypertension: Secondary | ICD-10-CM | POA: Diagnosis not present

## 2015-05-05 DIAGNOSIS — E785 Hyperlipidemia, unspecified: Secondary | ICD-10-CM | POA: Diagnosis not present

## 2015-05-05 DIAGNOSIS — F0390 Unspecified dementia without behavioral disturbance: Secondary | ICD-10-CM | POA: Diagnosis not present

## 2015-05-05 DIAGNOSIS — I82402 Acute embolism and thrombosis of unspecified deep veins of left lower extremity: Secondary | ICD-10-CM | POA: Diagnosis not present

## 2015-05-05 DIAGNOSIS — I82401 Acute embolism and thrombosis of unspecified deep veins of right lower extremity: Secondary | ICD-10-CM | POA: Diagnosis not present

## 2015-05-05 DIAGNOSIS — I2699 Other pulmonary embolism without acute cor pulmonale: Secondary | ICD-10-CM | POA: Diagnosis not present

## 2015-05-05 DIAGNOSIS — K219 Gastro-esophageal reflux disease without esophagitis: Secondary | ICD-10-CM | POA: Diagnosis not present

## 2015-05-05 DIAGNOSIS — M150 Primary generalized (osteo)arthritis: Secondary | ICD-10-CM | POA: Diagnosis not present

## 2015-05-10 DIAGNOSIS — I82402 Acute embolism and thrombosis of unspecified deep veins of left lower extremity: Secondary | ICD-10-CM | POA: Diagnosis not present

## 2015-05-10 DIAGNOSIS — K219 Gastro-esophageal reflux disease without esophagitis: Secondary | ICD-10-CM | POA: Diagnosis not present

## 2015-05-10 DIAGNOSIS — I82401 Acute embolism and thrombosis of unspecified deep veins of right lower extremity: Secondary | ICD-10-CM | POA: Diagnosis not present

## 2015-05-10 DIAGNOSIS — I10 Essential (primary) hypertension: Secondary | ICD-10-CM | POA: Diagnosis not present

## 2015-05-10 DIAGNOSIS — F0390 Unspecified dementia without behavioral disturbance: Secondary | ICD-10-CM | POA: Diagnosis not present

## 2015-05-10 DIAGNOSIS — I2699 Other pulmonary embolism without acute cor pulmonale: Secondary | ICD-10-CM | POA: Diagnosis not present

## 2015-05-10 DIAGNOSIS — M150 Primary generalized (osteo)arthritis: Secondary | ICD-10-CM | POA: Diagnosis not present

## 2015-05-10 DIAGNOSIS — E1142 Type 2 diabetes mellitus with diabetic polyneuropathy: Secondary | ICD-10-CM | POA: Diagnosis not present

## 2015-05-10 DIAGNOSIS — E785 Hyperlipidemia, unspecified: Secondary | ICD-10-CM | POA: Diagnosis not present

## 2015-05-12 DIAGNOSIS — M150 Primary generalized (osteo)arthritis: Secondary | ICD-10-CM | POA: Diagnosis not present

## 2015-05-12 DIAGNOSIS — I82401 Acute embolism and thrombosis of unspecified deep veins of right lower extremity: Secondary | ICD-10-CM | POA: Diagnosis not present

## 2015-05-12 DIAGNOSIS — E785 Hyperlipidemia, unspecified: Secondary | ICD-10-CM | POA: Diagnosis not present

## 2015-05-12 DIAGNOSIS — K219 Gastro-esophageal reflux disease without esophagitis: Secondary | ICD-10-CM | POA: Diagnosis not present

## 2015-05-12 DIAGNOSIS — I10 Essential (primary) hypertension: Secondary | ICD-10-CM | POA: Diagnosis not present

## 2015-05-12 DIAGNOSIS — I82402 Acute embolism and thrombosis of unspecified deep veins of left lower extremity: Secondary | ICD-10-CM | POA: Diagnosis not present

## 2015-05-12 DIAGNOSIS — I2699 Other pulmonary embolism without acute cor pulmonale: Secondary | ICD-10-CM | POA: Diagnosis not present

## 2015-05-12 DIAGNOSIS — F0390 Unspecified dementia without behavioral disturbance: Secondary | ICD-10-CM | POA: Diagnosis not present

## 2015-05-12 DIAGNOSIS — E1142 Type 2 diabetes mellitus with diabetic polyneuropathy: Secondary | ICD-10-CM | POA: Diagnosis not present

## 2015-05-13 DIAGNOSIS — I10 Essential (primary) hypertension: Secondary | ICD-10-CM | POA: Diagnosis not present

## 2015-05-13 DIAGNOSIS — E1140 Type 2 diabetes mellitus with diabetic neuropathy, unspecified: Secondary | ICD-10-CM | POA: Diagnosis not present

## 2015-05-13 DIAGNOSIS — I2699 Other pulmonary embolism without acute cor pulmonale: Secondary | ICD-10-CM | POA: Diagnosis not present

## 2015-05-13 DIAGNOSIS — R000 Tachycardia, unspecified: Secondary | ICD-10-CM | POA: Diagnosis not present

## 2015-05-13 DIAGNOSIS — Z794 Long term (current) use of insulin: Secondary | ICD-10-CM | POA: Diagnosis not present

## 2015-05-17 DIAGNOSIS — I82401 Acute embolism and thrombosis of unspecified deep veins of right lower extremity: Secondary | ICD-10-CM | POA: Diagnosis not present

## 2015-05-17 DIAGNOSIS — E1142 Type 2 diabetes mellitus with diabetic polyneuropathy: Secondary | ICD-10-CM | POA: Diagnosis not present

## 2015-05-17 DIAGNOSIS — M150 Primary generalized (osteo)arthritis: Secondary | ICD-10-CM | POA: Diagnosis not present

## 2015-05-17 DIAGNOSIS — I2699 Other pulmonary embolism without acute cor pulmonale: Secondary | ICD-10-CM | POA: Diagnosis not present

## 2015-05-17 DIAGNOSIS — I82402 Acute embolism and thrombosis of unspecified deep veins of left lower extremity: Secondary | ICD-10-CM | POA: Diagnosis not present

## 2015-05-17 DIAGNOSIS — I10 Essential (primary) hypertension: Secondary | ICD-10-CM | POA: Diagnosis not present

## 2015-05-17 DIAGNOSIS — E785 Hyperlipidemia, unspecified: Secondary | ICD-10-CM | POA: Diagnosis not present

## 2015-05-17 DIAGNOSIS — K219 Gastro-esophageal reflux disease without esophagitis: Secondary | ICD-10-CM | POA: Diagnosis not present

## 2015-05-17 DIAGNOSIS — F0390 Unspecified dementia without behavioral disturbance: Secondary | ICD-10-CM | POA: Diagnosis not present

## 2015-05-20 DIAGNOSIS — F0390 Unspecified dementia without behavioral disturbance: Secondary | ICD-10-CM | POA: Diagnosis not present

## 2015-05-20 DIAGNOSIS — E1142 Type 2 diabetes mellitus with diabetic polyneuropathy: Secondary | ICD-10-CM | POA: Diagnosis not present

## 2015-05-20 DIAGNOSIS — M150 Primary generalized (osteo)arthritis: Secondary | ICD-10-CM | POA: Diagnosis not present

## 2015-05-20 DIAGNOSIS — I10 Essential (primary) hypertension: Secondary | ICD-10-CM | POA: Diagnosis not present

## 2015-05-20 DIAGNOSIS — I82401 Acute embolism and thrombosis of unspecified deep veins of right lower extremity: Secondary | ICD-10-CM | POA: Diagnosis not present

## 2015-05-20 DIAGNOSIS — I2699 Other pulmonary embolism without acute cor pulmonale: Secondary | ICD-10-CM | POA: Diagnosis not present

## 2015-05-20 DIAGNOSIS — E785 Hyperlipidemia, unspecified: Secondary | ICD-10-CM | POA: Diagnosis not present

## 2015-05-20 DIAGNOSIS — K219 Gastro-esophageal reflux disease without esophagitis: Secondary | ICD-10-CM | POA: Diagnosis not present

## 2015-05-20 DIAGNOSIS — I82402 Acute embolism and thrombosis of unspecified deep veins of left lower extremity: Secondary | ICD-10-CM | POA: Diagnosis not present

## 2015-06-24 ENCOUNTER — Emergency Department (HOSPITAL_COMMUNITY)
Admission: EM | Admit: 2015-06-24 | Discharge: 2015-06-24 | Disposition: A | Discharge status: Home / Self Care | Payer: Medicare PPO | Attending: Emergency Medicine | Admitting: Emergency Medicine

## 2015-06-24 ENCOUNTER — Encounter (HOSPITAL_COMMUNITY): Payer: Self-pay | Admitting: Emergency Medicine

## 2015-06-24 ENCOUNTER — Emergency Department (HOSPITAL_COMMUNITY): Payer: Medicare PPO

## 2015-06-24 DIAGNOSIS — Z87442 Personal history of urinary calculi: Secondary | ICD-10-CM | POA: Insufficient documentation

## 2015-06-24 DIAGNOSIS — J45909 Unspecified asthma, uncomplicated: Secondary | ICD-10-CM | POA: Diagnosis not present

## 2015-06-24 DIAGNOSIS — F0390 Unspecified dementia without behavioral disturbance: Secondary | ICD-10-CM | POA: Diagnosis not present

## 2015-06-24 DIAGNOSIS — Z794 Long term (current) use of insulin: Secondary | ICD-10-CM | POA: Diagnosis not present

## 2015-06-24 DIAGNOSIS — E119 Type 2 diabetes mellitus without complications: Secondary | ICD-10-CM | POA: Insufficient documentation

## 2015-06-24 DIAGNOSIS — E780 Pure hypercholesterolemia: Secondary | ICD-10-CM | POA: Diagnosis not present

## 2015-06-24 DIAGNOSIS — Z7902 Long term (current) use of antithrombotics/antiplatelets: Secondary | ICD-10-CM | POA: Diagnosis not present

## 2015-06-24 DIAGNOSIS — J90 Pleural effusion, not elsewhere classified: Secondary | ICD-10-CM | POA: Diagnosis not present

## 2015-06-24 DIAGNOSIS — Z79899 Other long term (current) drug therapy: Secondary | ICD-10-CM | POA: Diagnosis not present

## 2015-06-24 DIAGNOSIS — R531 Weakness: Secondary | ICD-10-CM | POA: Diagnosis not present

## 2015-06-24 DIAGNOSIS — H540 Blindness, both eyes: Secondary | ICD-10-CM | POA: Insufficient documentation

## 2015-06-24 DIAGNOSIS — I10 Essential (primary) hypertension: Secondary | ICD-10-CM | POA: Insufficient documentation

## 2015-06-24 DIAGNOSIS — Z87891 Personal history of nicotine dependence: Secondary | ICD-10-CM | POA: Diagnosis not present

## 2015-06-24 DIAGNOSIS — Z8546 Personal history of malignant neoplasm of prostate: Secondary | ICD-10-CM | POA: Diagnosis not present

## 2015-06-24 DIAGNOSIS — M1990 Unspecified osteoarthritis, unspecified site: Secondary | ICD-10-CM | POA: Insufficient documentation

## 2015-06-24 DIAGNOSIS — R05 Cough: Secondary | ICD-10-CM | POA: Diagnosis not present

## 2015-06-24 DIAGNOSIS — R000 Tachycardia, unspecified: Secondary | ICD-10-CM | POA: Diagnosis not present

## 2015-06-24 LAB — CBC WITH DIFFERENTIAL/PLATELET
BASOS PCT: 0 % (ref 0–1)
Basophils Absolute: 0 10*3/uL (ref 0.0–0.1)
EOS ABS: 0.2 10*3/uL (ref 0.0–0.7)
Eosinophils Relative: 3 % (ref 0–5)
HEMATOCRIT: 37.7 % — AB (ref 39.0–52.0)
HEMOGLOBIN: 12.6 g/dL — AB (ref 13.0–17.0)
LYMPHS ABS: 2 10*3/uL (ref 0.7–4.0)
Lymphocytes Relative: 30 % (ref 12–46)
MCH: 27.5 pg (ref 26.0–34.0)
MCHC: 33.4 g/dL (ref 30.0–36.0)
MCV: 82.3 fL (ref 78.0–100.0)
Monocytes Absolute: 0.6 10*3/uL (ref 0.1–1.0)
Monocytes Relative: 10 % (ref 3–12)
NEUTROS ABS: 3.8 10*3/uL (ref 1.7–7.7)
NEUTROS PCT: 57 % (ref 43–77)
Platelets: 206 10*3/uL (ref 150–400)
RBC: 4.58 MIL/uL (ref 4.22–5.81)
RDW: 15.9 % — ABNORMAL HIGH (ref 11.5–15.5)
WBC: 6.7 10*3/uL (ref 4.0–10.5)

## 2015-06-24 LAB — URINALYSIS, ROUTINE W REFLEX MICROSCOPIC
Bilirubin Urine: NEGATIVE
Glucose, UA: NEGATIVE mg/dL
Hgb urine dipstick: NEGATIVE
Ketones, ur: NEGATIVE mg/dL
LEUKOCYTES UA: NEGATIVE
NITRITE: NEGATIVE
PROTEIN: NEGATIVE mg/dL
SPECIFIC GRAVITY, URINE: 1.021 (ref 1.005–1.030)
UROBILINOGEN UA: 1 mg/dL (ref 0.0–1.0)
pH: 5.5 (ref 5.0–8.0)

## 2015-06-24 LAB — I-STAT TROPONIN, ED: TROPONIN I, POC: 0.01 ng/mL (ref 0.00–0.08)

## 2015-06-24 LAB — COMPREHENSIVE METABOLIC PANEL
ALT: 12 U/L — AB (ref 17–63)
ANION GAP: 5 (ref 5–15)
AST: 21 U/L (ref 15–41)
Albumin: 3.6 g/dL (ref 3.5–5.0)
Alkaline Phosphatase: 65 U/L (ref 38–126)
BUN: 18 mg/dL (ref 6–20)
CALCIUM: 8.8 mg/dL — AB (ref 8.9–10.3)
CHLORIDE: 112 mmol/L — AB (ref 101–111)
CO2: 25 mmol/L (ref 22–32)
CREATININE: 1.4 mg/dL — AB (ref 0.61–1.24)
GFR, EST AFRICAN AMERICAN: 48 mL/min — AB (ref >60)
GFR, EST NON AFRICAN AMERICAN: 42 mL/min — AB (ref >60)
Glucose, Bld: 159 mg/dL — ABNORMAL HIGH (ref 65–99)
Potassium: 3.8 mmol/L (ref 3.5–5.1)
SODIUM: 142 mmol/L (ref 135–145)
Total Bilirubin: 0.3 mg/dL (ref 0.3–1.2)
Total Protein: 6.7 g/dL (ref 6.5–8.1)

## 2015-06-24 LAB — BRAIN NATRIURETIC PEPTIDE: B NATRIURETIC PEPTIDE 5: 43.8 pg/mL (ref 0.0–100.0)

## 2015-06-24 NOTE — Discharge Instructions (Signed)

## 2015-06-24 NOTE — ED Provider Notes (Signed)
CSN: [id number]     Arrival date & time 06/24/15  1715 History   First MD Initiated Contact with Patient 06/24/15 1727     Chief Complaint  Patient presents with  . Weakness     (Consider location/radiation/quality/duration/timing/severity/associated sxs/prior Treatment) HPI Comments: 81 year old male with extensive past medical history including dementia, PE on eliquis, type 2 diabetes, hypertension, hyperlipidemia who presents with weakness. History limited due to the patient's dementia and obtained primarily from EMS as well as the patient's family member at bedside. Family member reports that earlier today, the patient's caregivers at his living facility noticed that he was generally weak and appeared to be working harder to breathe around noon. At 3:00, he began complaining that he "felt bad" and they noted that his blood pressure was low. Blood glucose was 188. He later developed some coughing. He was brought in for evaluation with concern for near syncope. No LOC. Currently, the patient denies any complaints. He denies any chest pain, abdominal pain, shortness of breath, headache, or extremity weakness.  Patient is a 81 y.o. male presenting with weakness. The history is provided by the patient, a relative and the EMS personnel.  Weakness    Past Medical History  Diagnosis Date  . Arthritis   . Hypertension   . Hypercholesteremia   . Insomnia     occasional  . Nephrolithiasis     right UVJ stone  . Dementia   . Memory loss   . Headache(784.0)     slight  . Asthma     hx of as child  . PONV (postoperative nausea and vomiting)   . Complete loss of vision     right eye  . Peripheral neuropathy     both legs  . Prostate cancer   . Diabetes mellitus    Past Surgical History  Procedure Laterality Date  . Appendectomy    . Pars plana vitrectomy  10/24/2011    Procedure: PARS PLANA VITRECTOMY WITH 23 GAUGE;  Surgeon: Adonis Brook, MD;  Location: [facility];  Service: Ophthalmology;   Laterality: Right;  . Tonsillectomy  as teen  . Knee arthroscopy Right   . Hemorroidectomy    . Cystoscopy w/ ureteral stent placement Right 10/01/2013    Procedure: cystoscopy right retrograde holmium laser lithotripsy;  Surgeon: Franchot Gallo, MD;  Location: [facility];  Service: Urology;  Laterality: Right;  holmium laser application  . Cornea removed Right 02-26-15   Family History  Problem Relation Age of Onset  . Cancer Mother     uterus  . Heart Problems Father   . Cancer Brother     stomach  . Cancer Brother     throat  . Colon cancer Brother    Social History  Substance Use Topics  . Smoking status: Former Smoker -- 0.25 packs/day    Types: Cigarettes    Quit date: 10/15/1966  . Smokeless tobacco: Never Used  . Alcohol Use: 0.0 oz/week     Comment: occasional    Review of Systems  Neurological: Positive for weakness.   10 Systems reviewed and are negative for acute change except as noted in the HPI.   Allergies  Donepezil and Hydrocodone  Home Medications   Prior to Admission medications   Medication Sig Start Date End Date Taking? Authorizing Provider  acetaminophen (TYLENOL) 325 MG tablet Take 2 tablets (650 mg total) by mouth every 6 (six) hours as needed for mild pain (or Fever >/= 101). 04/10/15  Yes Dawood S  Elgergawy, MD  apixaban (ELIQUIS) 5 MG TABS tablet use 10 mg oral twice a day for next 6 days till 7/1 , then decrease to 5 mg oral twice a day thereafter Patient taking differently: Take 5 mg by mouth 2 (two) times daily.  04/10/15  Yes Silver Huguenin Elgergawy, MD  B-D INS SYRINGE 0.5CC/30GX1/2" 30G X 1/2" 0.5 ML MISC Inject 1 each into the skin as directed.  04/29/15  Yes Historical Provider, MD  BD PEN NEEDLE NANO U/F 32G X 4 MM MISC Inject 1 each into the skin as directed.  04/29/15  Yes Historical Provider, MD  docusate sodium 100 MG CAPS Take 200 mg by mouth 2 (two) times daily as needed for mild constipation. Patient taking differently: Take 200 mg by  mouth daily.  08/25/14  Yes Thurnell Lose, MD  esomeprazole (NEXIUM) 40 MG capsule Take 1 capsule (40 mg total) by mouth 2 (two) times daily before a meal. 04/04/15  Yes Barton Dubois, MD  famotidine (PEPCID) 20 MG tablet Take 1 tablet (20 mg total) by mouth at bedtime. 04/04/15  Yes Barton Dubois, MD  furosemide (LASIX) 20 MG tablet Take 20 mg by mouth See admin instructions. Take on Mondays Wednesday and Friday   Yes Historical Provider, MD  glipiZIDE (GLUCOTROL) 10 MG tablet Take 10 mg by mouth 2 (two) times daily before a meal.    Yes Historical Provider, MD  HYDROcodone-acetaminophen (NORCO/VICODIN) 5-325 MG per tablet Take one tablet by mouth every 6 hours as needed for moderate pain. Do not exceed 4gm of Tylenol in 24 hours Patient taking differently: Take 1 tablet by mouth every 6 (six) hours as needed for moderate pain or severe pain. Do not exceed 4gm of Tylenol in 24 hours 04/12/15  Yes Lauree Chandler, NP  insulin detemir (LEVEMIR) 100 UNIT/ML injection Inject 0.1 mLs (10 Units total) into the skin daily at 10 pm. Patient taking differently: Inject 15 Units into the skin daily at 10 pm.  04/10/15  Yes [name], MD  memantine (NAMENDA) 10 MG tablet Take 1 tablet (10 mg total) by mouth 2 (two) times daily. 03/17/15  Yes Dennie Bible, NP  metoprolol tartrate (LOPRESSOR) 25 MG tablet Take 12.5 mg by mouth 2 (two) times daily.  06/23/15  Yes Historical Provider, MD  polyethylene glycol (MIRALAX / GLYCOLAX) packet Take 17 g by mouth daily.  04/29/15  Yes Historical Provider, MD  simvastatin (ZOCOR) 40 MG tablet Take 40 mg by mouth every morning.    Yes Historical Provider, MD  bisacodyl (DULCOLAX) 10 MG suppository Place 1 suppository (10 mg total) rectally daily as needed for moderate constipation. Patient not taking: Reported on 06/24/2015 04/10/15   Silver Huguenin Elgergawy, MD  insulin aspart (NOVOLOG) 100 UNIT/ML injection Inject 0-15 Units into the skin 3 (three) times daily with  meals. Patient not taking: Reported on 06/24/2015 04/10/15   Silver Huguenin Elgergawy, MD   BP 134/74 mmHg  Pulse 90  Temp(Src) 98 F (36.7 C) (Oral)  Resp 20  SpO2 100% Physical Exam  Constitutional: He appears well-developed and well-nourished. No distress.  HENT:  Head: Normocephalic and atraumatic.  Moist mucous membranes  Eyes: Conjunctivae are normal. Pupils are equal, round, and reactive to light.  Neck: Neck supple.  Cardiovascular: Normal rate, regular rhythm, normal heart sounds and intact distal pulses.   No murmur heard. Pulmonary/Chest: Effort normal and breath sounds normal.  Abdominal: Soft. Bowel sounds are normal. He exhibits no distension. There is  no tenderness.  Musculoskeletal:  2+ b/l LE edema  Neurological: He is alert. He has normal reflexes. No cranial nerve deficit. He exhibits normal muscle tone.  Fluent speech, oriented to person and place only, 5/5 strength and normal sensation throughout  Skin: Skin is warm and dry. No rash noted.  Psychiatric: He has a normal mood and affect. Judgment normal.  Nursing note and vitals reviewed.   ED Course  Procedures (including critical care time) Labs Review Labs Reviewed  COMPREHENSIVE METABOLIC PANEL - Abnormal; Notable for the following:    Chloride 112 (*)    Glucose, Bld 159 (*)    Creatinine, Ser 1.40 (*)    Calcium 8.8 (*)    ALT 12 (*)    GFR calc non Af Amer 42 (*)    GFR calc Af Amer 48 (*)    All other components within normal limits  CBC WITH DIFFERENTIAL/PLATELET - Abnormal; Notable for the following:    Hemoglobin 12.6 (*)    HCT 37.7 (*)    RDW 15.9 (*)    All other components within normal limits  URINALYSIS, ROUTINE W REFLEX MICROSCOPIC (NOT AT [facility])  BRAIN NATRIURETIC PEPTIDE  I-STAT TROPOININ, ED    Imaging Review Dg Chest 2 View  06/24/2015   CLINICAL DATA:  Weakness and syncope for 1 week, cough recently, former smoker  EXAM: CHEST  2 VIEW  COMPARISON:  04/08/2015  FINDINGS: Mild cardiac  enlargement stable. Vascular pattern normal. Hyperinflation consistent with COPD. 1.2 cm nodular opacity over the right lower lobe. This is new from the prior study. No pleural effusions. Multilevel degenerative disc disease throughout the thoracic spine.  IMPRESSION: CT thorax recommended to evaluate possible mass right lower lobe.   Electronically Signed   By: Skipper Cliche M.D.   On: 06/24/2015 19:34   Ct Chest Wo Contrast  06/24/2015   CLINICAL DATA:  Near syncopal episode. History of dementia. Abnormal chest radiograph. No current chest complaints.  EXAM: CT CHEST WITHOUT CONTRAST  TECHNIQUE: Multidetector CT imaging of the chest was performed following the standard protocol without IV contrast.  COMPARISON:  Chest radiograph earlier today.  FINDINGS: Mediastinum: Cardiomegaly. Three vessel coronary artery calcification. No pericardial fluid, thickening or calcification. No acute abnormality of the thoracic aorta or other great vessels of the mediastinum. No pathologically enlarged mediastinal or hilar lymph nodes. The esophagus is normal in appearance.  Lungs/Pleura: No consolidative airspace disease. LEFT pleural effusion. Mild scarring at both bases. The area of question on the prior chest radiograph represents a prominent nipple. No suspicious appearing pulmonary nodules or masses.  Upper Abdomen: Visualized portions of the upper abdomen are unremarkable.  Musculoskeletal: No aggressive appearing lytic or blastic lesions are noted in the visualized portions of the skeleton.  IMPRESSION: No evidence for pulmonary neoplasm. Prominent nipple shadow accounts for the observed chest radiograph finding.  LEFT pleural effusion.  Mild bibasilar scarring.   Electronically Signed   By: Staci Righter M.D.   On: 06/24/2015 21:44   I have personally reviewed and evaluated these lab results as part of my medical decision-making.   EKG Interpretation None      MDM   Final diagnoses:  Weakness    81 year old male with multiple medical problems who presents from his nursing facility for reports of generalized weakness and a near syncopal episode earlier today. They also felt that he seemed short of breath around noon. At presentation by EMS, the patient was awake, alert, well appearing and in  no acute distress. Vital signs unremarkable. Cardiopulmonary exam normal and patient without complaints. Obtained EKG, chest x-ray, and above lab work including troponin and BNP given reports of possible shortness of breath.  EKG without acute ischemia. Labs show stable creatinine at 1.4, negative troponin and normal BNP. Chest x-ray with possible abnormality, CT racks recommended. Obtained a noncontrasted CT which showed no abnormality of the exception of left pleural effusion. The patient does have a history of PE and I have considered PE but he is currently on Eliquis and family states that he receives medications as prescribed. Normal troponin and reassuring vital signs. Patient has not demonstrated any SOB symptoms here and no complaints of pain. He states that he feels well. No neurologic deficits to suggest acute intracranial process. I have reviewed workup with the patient and his family member. I have reviewed return precautions and recommended follow-up with PCP next week. Return precautions reviewed and family voiced understanding. Patient discharged in satisfactory condition.   Sharlett Iles, MD 06/24/15 [phone]

## 2015-06-24 NOTE — ED Notes (Signed)
Patient aware that a urine sample is needed, urinal is at the bedside. 

## 2015-06-24 NOTE — ED Notes (Signed)
Pt contact is Marvin Bishop Number: [phone]

## 2015-06-24 NOTE — ED Notes (Addendum)
141/84, 93hr, 173cbg. 20g left hand per EMS.

## 2015-06-24 NOTE — ED Notes (Signed)
Bed: WA04 Expected date:  Expected time:  Means of arrival:  Comments: Ems-93 syncope

## 2015-06-24 NOTE — ED Notes (Signed)
Pt presents from home via EMS for near syncopal episode. Patient with history of dementia, alert to self, time, situation.

## 2015-07-06 DIAGNOSIS — J309 Allergic rhinitis, unspecified: Secondary | ICD-10-CM | POA: Diagnosis not present

## 2015-07-06 DIAGNOSIS — R05 Cough: Secondary | ICD-10-CM | POA: Diagnosis not present

## 2015-07-19 DIAGNOSIS — N183 Chronic kidney disease, stage 3 (moderate): Secondary | ICD-10-CM | POA: Diagnosis not present

## 2015-07-19 DIAGNOSIS — C61 Malignant neoplasm of prostate: Secondary | ICD-10-CM | POA: Diagnosis not present

## 2015-07-19 DIAGNOSIS — E1140 Type 2 diabetes mellitus with diabetic neuropathy, unspecified: Secondary | ICD-10-CM | POA: Diagnosis not present

## 2015-07-19 DIAGNOSIS — E782 Mixed hyperlipidemia: Secondary | ICD-10-CM | POA: Diagnosis not present

## 2015-07-19 DIAGNOSIS — I10 Essential (primary) hypertension: Secondary | ICD-10-CM | POA: Diagnosis not present

## 2015-07-19 DIAGNOSIS — F0390 Unspecified dementia without behavioral disturbance: Secondary | ICD-10-CM | POA: Diagnosis not present

## 2015-07-19 DIAGNOSIS — I2699 Other pulmonary embolism without acute cor pulmonale: Secondary | ICD-10-CM | POA: Diagnosis not present

## 2015-07-19 DIAGNOSIS — I739 Peripheral vascular disease, unspecified: Secondary | ICD-10-CM | POA: Diagnosis not present

## 2015-07-19 DIAGNOSIS — Z23 Encounter for immunization: Secondary | ICD-10-CM | POA: Diagnosis not present

## 2015-07-28 ENCOUNTER — Encounter: Payer: Self-pay | Admitting: Podiatry

## 2015-07-28 ENCOUNTER — Ambulatory Visit (INDEPENDENT_AMBULATORY_CARE_PROVIDER_SITE_OTHER): Payer: Medicare PPO | Admitting: Podiatry

## 2015-07-28 ENCOUNTER — Ambulatory Visit: Payer: Medicare PPO | Admitting: Podiatry

## 2015-07-28 VITALS — BP 113/69 | HR 89 | Resp 16

## 2015-07-28 DIAGNOSIS — B351 Tinea unguium: Secondary | ICD-10-CM

## 2015-07-28 DIAGNOSIS — M79673 Pain in unspecified foot: Secondary | ICD-10-CM

## 2015-07-29 NOTE — Progress Notes (Signed)
Subjective:     Patient ID: Marvin Bishop., male   DOB: 26-Dec-1919, 81 y.o.   MRN: [id number]  HPI patient presents with nail disease 1-5 both feet that are thick yellow and brittle and are painful and impossible for him to cut   Review of Systems     Objective:   Physical Exam Neurovascular status intact with thick yellow brittle nailbeds 1-5 both feet that he cannot cut and he is in poor health    Assessment:     Mycotic nail infection 1-5 both feet with pain    Plan:     Debride nailbeds 1-5 both feet with no iatrogenic bleeding noted

## 2015-09-21 DIAGNOSIS — E1140 Type 2 diabetes mellitus with diabetic neuropathy, unspecified: Secondary | ICD-10-CM | POA: Diagnosis not present

## 2015-09-21 DIAGNOSIS — Z7984 Long term (current) use of oral hypoglycemic drugs: Secondary | ICD-10-CM | POA: Diagnosis not present

## 2015-09-21 DIAGNOSIS — E1165 Type 2 diabetes mellitus with hyperglycemia: Secondary | ICD-10-CM | POA: Diagnosis not present

## 2015-09-22 ENCOUNTER — Ambulatory Visit: Payer: Self-pay | Admitting: Nurse Practitioner

## 2015-09-29 ENCOUNTER — Encounter: Payer: Self-pay | Admitting: Nurse Practitioner

## 2015-09-29 ENCOUNTER — Encounter (INDEPENDENT_AMBULATORY_CARE_PROVIDER_SITE_OTHER): Payer: Self-pay

## 2015-09-29 ENCOUNTER — Ambulatory Visit (INDEPENDENT_AMBULATORY_CARE_PROVIDER_SITE_OTHER): Payer: Medicare PPO | Admitting: Nurse Practitioner

## 2015-09-29 VITALS — BP 120/70 | HR 97 | Ht 73.0 in | Wt 217.4 lb

## 2015-09-29 DIAGNOSIS — R413 Other amnesia: Secondary | ICD-10-CM | POA: Diagnosis not present

## 2015-09-29 DIAGNOSIS — F0390 Unspecified dementia without behavioral disturbance: Secondary | ICD-10-CM

## 2015-09-29 MED ORDER — MEMANTINE HCL 10 MG PO TABS: 10.0000 mg | ORAL_TABLET | Freq: Two times a day (BID) | ORAL | Status: DC

## 2015-09-29 NOTE — Progress Notes (Signed)
[facility]  PATIENT: Marvin WITTING Sr. DOB: 1920-03-15   REASON FOR VISIT:follow up for memory loss, dementia HISTORY FROM:patient and daughter    HISTORY OF PRESENT ILLNESS:Marvin Bishop, 81 year old male returns for followup. He has a history of dementia . He stopped his Aricept in August 2015 due to GI effect. He was placed on Namenda  09/14/2014 and denies side effects .His Mini-Mental Status exam is stable. He continues to live in his home has 24/7 CNAs. He requires assistance with ADLs except feeding himself. They also administer his meds.Marland Kitchen He is accompanied by a daughter today who is his POA she denies that there have been any safety issues identified.  MRI showed mild changes of chronic microvascular ischemia and generalized cerebral atrophy.He had recent right eye removal due to detached retina He returns for reevaluation.No recent falls.  HISTORY: 81 years old right-handed male, accompanied by her daughter, referred by her primary care physician Dr. Benita Stabile for evaluation of memory trouble  He had a past medical history of hypertension, diabetes, hyperlipidemia, prostate cancer, had a history of a left knee reconstruction surgery  He had sixth-years of education, is a retired Sports coach in 2007, he lives alone, still driving short distance, over the past one year, his daughter noticed that he has become forgetful, he forget to take his medications, he misplace things, he often gets out of his car, forgot to close his car door, he also has mild gait difficulty, using a cane.  He denies sleep difficulty, has mild gait difficulty, he denies bowel and bladder incontinence, he can still pay his own bill, cooking simple meals without significant difficulty. He still lives by himself, he still drives short distance, has not got lost yet.  REVIEW OF SYSTEMS: Full 14 system review of systems performed and notable only for those listed, all others are neg:    Constitutional: neg  Cardiovascular: neg Ear/Nose/Throat: neg  Skin: neg Eyes: neg Respiratory: neg Gastroitestinal: neg  Hematology/Lymphatic: neg  Endocrine: neg Musculoskeletal:walking difficulty Allergy/Immunology: neg Neurological: memory loss Psychiatric: neg Sleep : neg   ALLERGIES: Allergies  Allergen Reactions  . Donepezil Nausea And Vomiting  . Hydrocodone Nausea And Vomiting    HOME MEDICATIONS: Outpatient Prescriptions Prior to Visit  Medication Sig Dispense Refill  . acetaminophen (TYLENOL) 325 MG tablet Take 2 tablets (650 mg total) by mouth every 6 (six) hours as needed for mild pain (or Fever >/= 101).    Marland Kitchen apixaban (ELIQUIS) 5 MG TABS tablet use 10 mg oral twice a day for next 6 days till 7/1 , then decrease to 5 mg oral twice a day thereafter (Patient taking differently: Take 5 mg by mouth 2 (two) times daily. ) 60 tablet   . bisacodyl (DULCOLAX) 10 MG suppository Place 1 suppository (10 mg total) rectally daily as needed for moderate constipation. 12 suppository 0  . docusate sodium 100 MG CAPS Take 200 mg by mouth 2 (two) times daily as needed for mild constipation. (Patient taking differently: Take 200 mg by mouth daily. ) 25 capsule 0  . esomeprazole (NEXIUM) 40 MG capsule Take 1 capsule (40 mg total) by mouth 2 (two) times daily before a meal. 60 capsule 1  . famotidine (PEPCID) 20 MG tablet Take 1 tablet (20 mg total) by mouth at bedtime. 30 tablet 1  . furosemide (LASIX) 20 MG tablet Take 20 mg by mouth See admin instructions. Take on Mondays Wednesday and Friday    . glipiZIDE (GLUCOTROL) 10 MG  tablet Take 10 mg by mouth 2 (two) times daily before a meal.     . HYDROcodone-acetaminophen (NORCO/VICODIN) 5-325 MG per tablet Take one tablet by mouth every 6 hours as needed for moderate pain. Do not exceed 4gm of Tylenol in 24 hours (Patient taking differently: Take 1 tablet by mouth every 6 (six) hours as needed for moderate pain or severe pain. Do not  exceed 4gm of Tylenol in 24 hours) 120 tablet 0  . insulin detemir (LEVEMIR) 100 UNIT/ML injection Inject 0.1 mLs (10 Units total) into the skin daily at 10 pm. (Patient taking differently: Inject 20 Units into the skin daily at 10 pm. ) 10 mL 11  . Loratadine (CLARITIN) 10 MG CAPS Take 1 capsule by mouth daily.    . memantine (NAMENDA) 10 MG tablet Take 1 tablet (10 mg total) by mouth 2 (two) times daily. 60 tablet 6  . metoprolol tartrate (LOPRESSOR) 25 MG tablet Take 12.5 mg by mouth 2 (two) times daily.     . polyethylene glycol (MIRALAX / GLYCOLAX) packet Take 17 g by mouth daily.   6  . simvastatin (ZOCOR) 40 MG tablet Take 40 mg by mouth every morning.     Marland Kitchen UNABLE TO FIND Take 1 tablet by mouth 2 (two) times daily. Eldertonic - 15 ml - vitamin    . B-D INS SYRINGE 0.5CC/30GX1/2" 30G X 1/2" 0.5 ML MISC Inject 1 each into the skin as directed. Reported on 09/29/2015  0  . BD PEN NEEDLE NANO U/F 32G X 4 MM MISC Inject 1 each into the skin as directed. Reported on 09/29/2015  4  . insulin aspart (NOVOLOG) 100 UNIT/ML injection Inject 0-15 Units into the skin 3 (three) times daily with meals. (Patient not taking: Reported on 09/29/2015) 10 mL 11   No facility-administered medications prior to visit.    PAST MEDICAL HISTORY: Past Medical History  Diagnosis Date  . Arthritis   . Hypertension   . Hypercholesteremia   . Insomnia     occasional  . Nephrolithiasis     right UVJ stone  . Dementia   . Memory loss   . Headache(784.0)     slight  . Asthma     hx of as child  . PONV (postoperative nausea and vomiting)   . Complete loss of vision     right eye  . Peripheral neuropathy (HCC)     both legs  . Prostate cancer ([place])   . Diabetes mellitus     PAST SURGICAL HISTORY: Past Surgical History  Procedure Laterality Date  . Appendectomy    . Pars plana vitrectomy  10/24/2011    Procedure: PARS PLANA VITRECTOMY WITH 23 GAUGE;  Surgeon: Adonis Brook, MD;  Location: [facility];   Service: Ophthalmology;  Laterality: Right;  . Tonsillectomy  as teen  . Knee arthroscopy Right   . Hemorroidectomy    . Cystoscopy w/ ureteral stent placement Right 10/01/2013    Procedure: cystoscopy right retrograde holmium laser lithotripsy;  Surgeon: Franchot Gallo, MD;  Location: [facility];  Service: Urology;  Laterality: Right;  holmium laser application  . Cornea removed Right 02-26-15    FAMILY HISTORY: Family History  Problem Relation Age of Onset  . Cancer Mother     uterus  . Heart Problems Father   . Cancer Brother     stomach  . Cancer Brother     throat  . Colon cancer Brother     SOCIAL HISTORY: Social History  Social History  . Marital Status: Widowed    Spouse Name: N/A  . Number of Children: 5  . Years of Education: 6 th   Occupational History  .      Retired   Social History Main Topics  . Smoking status: Former Smoker -- 0.25 packs/day    Types: Cigarettes    Quit date: 10/15/1966  . Smokeless tobacco: Never Used  . Alcohol Use: 0.0 oz/week     Comment: occasional  . Drug Use: No  . Sexual Activity: No   Other Topics Concern  . Not on file   Social History Narrative   Patient lives at home alone with dog.   Retired.   Education.- 6 th  Grade   Right handed.   Caffeine-Soda , tea and coffee                 PHYSICAL EXAM  Filed Vitals:   09/29/15 1107  BP: 120/70  Pulse: 97  Height: 6\' 1"  (1.854 m)  Weight: 217 lb 6.4 oz (98.612 kg)   Body mass index is 28.69 kg/(m^2). Generalized: Well developed, in no acute distress  Head: normocephalic and atraumatic,. Oropharynx benign  Musculoskeletal: No deformity   Neurological examination   Mentation: Alert , MMSE 18/30 missing items in orientation , calculation and 3 out of 3 recall. AFT 6, Clock drawing 3/4.Follows all commands speech and language fluent  Cranial nerve II-XII: Left Pupil was equal round reactive to light. Right eye removal  extraocular movements were full,  visual field were full on confrontational test. Facial sensation and strength were normal. hearing was intact to finger rubbing bilaterally. Uvula tongue midline. head turning and shoulder shrug were normal and symmetric.Tongue protrusion into cheek strength was normal. Motor: normal bulk and tone, full strength in the BUE, BLE, fine finger movements normal, no pronator drift. No focal weakness Coordination: finger-nose-finger, heel-to-shin bilaterally, no dysmetria Reflexes: Brachioradialis 2/2, biceps 2/2, triceps 2/2, patellar 2/2, Achilles absent, plantar responses were flexor bilaterally. Gait and Station: Rising up from seated position without assistance, cautious steady gait, stooped posture with single-point cane   DIAGNOSTIC DATA (LABS, IMAGING, TESTING) - I reviewed patient records, labs, notes, testing and imaging myself where available.  Lab Results  Component Value Date   WBC 6.7 06/24/2015   HGB 12.6* 06/24/2015   HCT 37.7* 06/24/2015   MCV 82.3 06/24/2015   PLT 206 06/24/2015      Component Value Date/Time   NA 142 06/24/2015 1747   K 3.8 06/24/2015 1747   CL 112* 06/24/2015 1747   CO2 25 06/24/2015 1747   GLUCOSE 159* 06/24/2015 1747   BUN 18 06/24/2015 1747   CREATININE 1.40* 06/24/2015 1747   CALCIUM 8.8* 06/24/2015 1747   PROT 6.7 06/24/2015 1747   ALBUMIN 3.6 06/24/2015 1747   AST 21 06/24/2015 1747   ALT 12* 06/24/2015 1747   ALKPHOS 65 06/24/2015 1747   BILITOT 0.3 06/24/2015 1747   GFRNONAA 42* 06/24/2015 1747   GFRAA 48* 06/24/2015 1747     Lab Results  Component Value Date   TSH 1.989 04/01/2015      ASSESSMENT AND PLAN  81 y.o. year old male  has a past medical history of Arthritis; Hypertension; Hypercholesteremia; Insomnia;  Dementia; Memory loss;  here to follow-up.  Continue Namenda at current dose Rx to daughter Create a safe environment, remove locks on bathroom  Doors Reduced confusion, keep familiar objects and people around, stick  to a routine Use effective  communication such as simple words and short sentences Reduce nighttime restlessness, a consistent nighttime routine,  avoid napping during the day Encourage good nutrition and hydration Seek medical care for acute worsening confusion or fever, this usually indicates infection Follow-up in 6 months next appointment with Dr. Krista Blue Vst time 81 min Dennie Bible, [facility], [facility], [facility] [address] [place], [place] [address] [phone]

## 2015-09-29 NOTE — Patient Instructions (Signed)
Continue Namenda at current dose Rx to daughter Create a safe environment, remove locks on bathroom  Doors Reduced confusion, keep familiar objects and people around, stick to a routine Use effective communication such as simple words and short sentences Reduce nighttime restlessness, a consistent nighttime routine,  avoid napping during the day Encourage good nutrition and hydration Seek medical care for acute worsening confusion or fever, this usually indicates infection Follow-up in 6 months next appointment with Dr. Krista Blue

## 2015-09-30 DIAGNOSIS — Z8546 Personal history of malignant neoplasm of prostate: Secondary | ICD-10-CM | POA: Diagnosis not present

## 2015-09-30 DIAGNOSIS — R972 Elevated prostate specific antigen [PSA]: Secondary | ICD-10-CM | POA: Diagnosis not present

## 2015-09-30 DIAGNOSIS — R351 Nocturia: Secondary | ICD-10-CM | POA: Diagnosis not present

## 2015-09-30 DIAGNOSIS — C61 Malignant neoplasm of prostate: Secondary | ICD-10-CM | POA: Diagnosis not present

## 2015-09-30 NOTE — Progress Notes (Signed)
I have reviewed and agreed above plan. 

## 2015-10-11 DIAGNOSIS — E1165 Type 2 diabetes mellitus with hyperglycemia: Secondary | ICD-10-CM | POA: Diagnosis not present

## 2015-11-01 ENCOUNTER — Ambulatory Visit: Payer: Medicare PPO | Admitting: Podiatry

## 2015-11-01 ENCOUNTER — Telehealth: Payer: Self-pay | Admitting: Oncology

## 2015-11-01 NOTE — Telephone Encounter (Signed)
pt daughter cld to r/s appt-gave r/s time & date °

## 2015-11-04 ENCOUNTER — Other Ambulatory Visit: Payer: Self-pay

## 2015-11-11 ENCOUNTER — Ambulatory Visit: Payer: Self-pay | Admitting: Oncology

## 2015-11-17 ENCOUNTER — Other Ambulatory Visit (HOSPITAL_BASED_OUTPATIENT_CLINIC_OR_DEPARTMENT_OTHER): Payer: Self-pay

## 2015-11-17 DIAGNOSIS — D472 Monoclonal gammopathy: Secondary | ICD-10-CM

## 2015-11-17 LAB — COMPREHENSIVE METABOLIC PANEL
ALBUMIN: 3.7 g/dL (ref 3.5–5.0)
ALK PHOS: 83 U/L (ref 40–150)
ALT: 15 U/L (ref 0–55)
AST: 18 U/L (ref 5–34)
Anion Gap: 9 mEq/L (ref 3–11)
BUN: 18.3 mg/dL (ref 7.0–26.0)
CALCIUM: 9.8 mg/dL (ref 8.4–10.4)
CO2: 25 mEq/L (ref 22–29)
CREATININE: 1.5 mg/dL — AB (ref 0.7–1.3)
Chloride: 107 mEq/L (ref 98–109)
EGFR: 45 mL/min/{1.73_m2} — ABNORMAL LOW (ref >90)
GLUCOSE: 187 mg/dL — AB (ref 70–140)
POTASSIUM: 4.6 meq/L (ref 3.5–5.1)
SODIUM: 141 meq/L (ref 136–145)
TOTAL PROTEIN: 7.9 g/dL (ref 6.4–8.3)
Total Bilirubin: 0.58 mg/dL (ref 0.20–1.20)

## 2015-11-17 LAB — CBC WITH DIFFERENTIAL/PLATELET
BASO%: 0.4 % (ref 0.0–2.0)
BASOS ABS: 0 10*3/uL (ref 0.0–0.1)
EOS%: 2.9 % (ref 0.0–7.0)
Eosinophils Absolute: 0.2 10*3/uL (ref 0.0–0.5)
HEMATOCRIT: 42.1 % (ref 38.4–49.9)
HEMOGLOBIN: 13.9 g/dL (ref 13.0–17.1)
LYMPH#: 1.8 10*3/uL (ref 0.9–3.3)
LYMPH%: 26.1 % (ref 14.0–49.0)
MCH: 27.4 pg (ref 27.2–33.4)
MCHC: 33 g/dL (ref 32.0–36.0)
MCV: 83 fL (ref 79.3–98.0)
MONO#: 0.9 10*3/uL (ref 0.1–0.9)
MONO%: 13.5 % (ref 0.0–14.0)
NEUT%: 57.1 % (ref 39.0–75.0)
NEUTROS ABS: 3.9 10*3/uL (ref 1.5–6.5)
Platelets: 200 10*3/uL (ref 140–400)
RBC: 5.07 10*6/uL (ref 4.20–5.82)
RDW: 15.4 % — AB (ref 11.0–14.6)
WBC: 6.9 10*3/uL (ref 4.0–10.3)

## 2015-11-18 LAB — KAPPA/LAMBDA LIGHT CHAINS
IG KAPPA FREE LIGHT CHAIN: 85.81 mg/L — AB (ref 3.30–19.40)
IG LAMBDA FREE LIGHT CHAIN: 25.34 mg/L (ref 5.71–26.30)
Kappa/Lambda FluidC Ratio: 3.39 — ABNORMAL HIGH (ref 0.26–1.65)

## 2015-11-22 LAB — MULTIPLE MYELOMA PANEL, SERUM
ALBUMIN SERPL ELPH-MCNC: 3.6 g/dL (ref 2.9–4.4)
ALBUMIN/GLOB SERPL: 1.1 (ref 0.7–1.7)
Alpha 1: 0.2 g/dL (ref 0.0–0.4)
Alpha2 Glob SerPl Elph-Mcnc: 0.8 g/dL (ref 0.4–1.0)
B-Globulin SerPl Elph-Mcnc: 1.1 g/dL (ref 0.7–1.3)
GAMMA GLOB SERPL ELPH-MCNC: 1.5 g/dL (ref 0.4–1.8)
GLOBULIN, TOTAL: 3.6 g/dL (ref 2.2–3.9)
IGA/IMMUNOGLOBULIN A, SERUM: 178 mg/dL (ref 61–437)
IGM (IMMUNOGLOBIN M), SRM: 117 mg/dL (ref 15–143)
IgG, Qn, Serum: 1524 mg/dL (ref 700–1600)
M Protein SerPl Elph-Mcnc: 0.6 g/dL — ABNORMAL HIGH
Total Protein: 7.2 g/dL (ref 6.0–8.5)

## 2015-11-24 ENCOUNTER — Telehealth: Payer: Self-pay | Admitting: Oncology

## 2015-11-24 ENCOUNTER — Ambulatory Visit (HOSPITAL_BASED_OUTPATIENT_CLINIC_OR_DEPARTMENT_OTHER): Payer: Self-pay | Admitting: Oncology

## 2015-11-24 VITALS — BP 152/88 | HR 96 | Temp 98.1°F | Resp 18 | Ht 73.0 in | Wt 223.7 lb

## 2015-11-24 DIAGNOSIS — N189 Chronic kidney disease, unspecified: Secondary | ICD-10-CM

## 2015-11-24 DIAGNOSIS — D472 Monoclonal gammopathy: Secondary | ICD-10-CM

## 2015-11-24 DIAGNOSIS — E119 Type 2 diabetes mellitus without complications: Secondary | ICD-10-CM

## 2015-11-24 DIAGNOSIS — I2699 Other pulmonary embolism without acute cor pulmonale: Secondary | ICD-10-CM

## 2015-11-24 DIAGNOSIS — F0390 Unspecified dementia without behavioral disturbance: Secondary | ICD-10-CM

## 2015-11-24 DIAGNOSIS — I10 Essential (primary) hypertension: Secondary | ICD-10-CM

## 2015-11-24 NOTE — Progress Notes (Signed)
Hematology and Oncology Follow Up Visit  Dang Corpus [id number] 11-02-1919 82 y.o. 11/24/2015 3:47 PM   Principle Diagnosis: 82 year old gentleman with monoclonal gammopathy with IgG kappa subtype diagnosed in July 2016. He was found to have an M spike of 0.6 g/dL and a normal IgG level. He had an elevated light chain ratio. No evidence of clear-cut end organ damage on his initial workup.  Current therapy: Observation and surveillance.  Interim History:  Mr. Carrino presents today for a follow-up visit. Since the last visit, he report no major changes in his health. He was seen in the emergency department in September 2016 for weakness which have resolved at this time. He also follows up with neurology regarding his dementia and memory loss. He does not report any physical complaints today including pain or discomfort. Has not reported any pathological fractures or opportunistic infections.  He does not report any headaches, blurry vision, syncope or seizures. He does not report any fevers or chills or sweats. He does not report any cough or hemoptysis or hematemesis. Does not report any nausea, vomiting or abdominal pain. Does not report any constipation, diarrhea or hematochezia. He does not report any frequency urgency or hesitancy. He does not report any skeletal complaints. Remainder review of systems unremarkable.   Medications: I have reviewed the patient's current medications.  Current Outpatient Prescriptions  Medication Sig Dispense Refill  . acetaminophen (TYLENOL) 325 MG tablet Take 2 tablets (650 mg total) by mouth every 6 (six) hours as needed for mild pain (or Fever >/= 101).    . BD PEN NEEDLE NANO U/F 32G X 4 MM MISC See admin instructions.  4  . bisacodyl (DULCOLAX) 10 MG suppository Place 1 suppository (10 mg total) rectally daily as needed for moderate constipation. 12 suppository 0  . docusate sodium 100 MG CAPS Take 200 mg by mouth 2 (two) times daily as needed for mild  constipation. (Patient taking differently: Take 200 mg by mouth daily. ) 25 capsule 0  . esomeprazole (NEXIUM) 40 MG capsule Take 1 capsule (40 mg total) by mouth 2 (two) times daily before a meal. 60 capsule 1  . famotidine (PEPCID) 20 MG tablet Take 1 tablet (20 mg total) by mouth at bedtime. 30 tablet 1  . furosemide (LASIX) 20 MG tablet Take 20 mg by mouth See admin instructions. Take on Mondays Wednesday and Friday    . gabapentin (NEURONTIN) 100 MG capsule TAKE 2 CAPSULES BY MOUTH EVERY DAY AT BEDTIME  11  . glipiZIDE (GLUCOTROL) 10 MG tablet Take 10 mg by mouth 2 (two) times daily before a meal.     . insulin detemir (LEVEMIR) 100 UNIT/ML injection Inject 0.1 mLs (10 Units total) into the skin daily at 10 pm. (Patient taking differently: Inject 20 Units into the skin daily at 10 pm. ) 10 mL 11  . Loratadine (CLARITIN) 10 MG CAPS Take 1 capsule by mouth daily.    . memantine (NAMENDA) 10 MG tablet Take 1 tablet (10 mg total) by mouth 2 (two) times daily. 60 tablet 6  . metoprolol tartrate (LOPRESSOR) 25 MG tablet Take 12.5 mg by mouth 2 (two) times daily.     . polyethylene glycol (MIRALAX / GLYCOLAX) packet Take 17 g by mouth daily.   6  . simvastatin (ZOCOR) 40 MG tablet Take 40 mg by mouth every morning.     Marland Kitchen UNABLE TO FIND Take 1 tablet by mouth 2 (two) times daily. Eldertonic - 15 ml -  vitamin    . warfarin (COUMADIN) 5 MG tablet Take 5 mg by mouth daily.  5  . metFORMIN (GLUCOPHAGE) 500 MG tablet Take 500 mg by mouth 2 (two) times daily.  11   No current facility-administered medications for this visit.     Allergies:  Allergies  Allergen Reactions  . Donepezil Nausea And Vomiting  . Hydrocodone Nausea And Vomiting    Past Medical History, Surgical history, Social history, and Family History were reviewed and updated.  Physical Exam: Blood pressure 152/88, pulse 96, temperature 98.1 F (36.7 C), temperature source Oral, resp. rate 18, height 6\' 1"  (1.854 m), weight 223 lb  11.2 oz (101.47 kg), SpO2 100 %. ECOG: 2 General appearance: alert and cooperative Head: Normocephalic, without obvious abnormality Neck: no adenopathy Lymph nodes: Cervical, supraclavicular, and axillary nodes normal. Heart:regular rate and rhythm, S1, S2 normal, no murmur, click, rub or gallop Lung:chest clear, no wheezing, rales, normal symmetric air entry Abdomin: soft, non-tender, without masses or organomegaly EXT: Edema noted bilaterally.   Lab Results: Lab Results  Component Value Date   WBC 6.9 11/17/2015   HGB 13.9 11/17/2015   HCT 42.1 11/17/2015   MCV 83.0 11/17/2015   PLT 200 11/17/2015     Chemistry      Component Value Date/Time   NA 141 11/17/2015 1521   NA 142 06/24/2015 1747   K 4.6 11/17/2015 1521   K 3.8 06/24/2015 1747   CL 112* 06/24/2015 1747   CO2 25 11/17/2015 1521   CO2 25 06/24/2015 1747   BUN 18.3 11/17/2015 1521   BUN 18 06/24/2015 1747   CREATININE 1.5* 11/17/2015 1521   CREATININE 1.40* 06/24/2015 1747      Component Value Date/Time   CALCIUM 9.8 11/17/2015 1521   CALCIUM 8.8* 06/24/2015 1747   ALKPHOS 83 11/17/2015 1521   ALKPHOS 65 06/24/2015 1747   AST 18 11/17/2015 1521   AST 21 06/24/2015 1747   ALT 15 11/17/2015 1521   ALT 12* 06/24/2015 1747   BILITOT 0.58 11/17/2015 1521   BILITOT 0.3 06/24/2015 1747      Results for SHAFTER, KEEFOVER SR. (MRN [id number]) as of 11/24/2015 15:01  Ref. Range 11/17/2015 15:21  IgG (Immunoglobin G), Serum Latest Ref Range: [phone] mg/dL 1,524  M Protein SerPl Elph-Mcnc Latest Ref Range: Not Observed g/dL 0.6 (H)  Results for VERDEN, DONINI (MRN [id number]) as of 11/24/2015 15:01  Ref. Range 11/17/2015 15:21  Ig Kappa Free Light Chain Latest Ref Range: 3.30-19.40 mg/L 85.81 (H)  Ig Lambda Free Light Chain Latest Ref Range: 5.71-26.30 mg/L 25.34  Kappa/Lambda FluidC Ratio Latest Ref Range: 0.26-1.65  3.39 (H)      Impression and Plan:   81 year old gentleman with the following issues:  1.  Monoclonal gammopathy with IgG kappa subtype diagnosed in July 2016  after presenting with increased kappa to lambda ratio of 2.68. His protein studies February 2017 were reviewed today and showed no spike of 0.6 g/dL and a normal IgG level. He continues to have slowly increase Kappa to lambda ratio.There is no clear-cut end organ damage at this time. He does have chronic renal insufficiency that is probably related to his hypertension and diabetes.   The differential diagnosis includes MGUS, reactive findings, amyloidosis, and less likely myeloma. The process continue with active surveillance and repeat these protein studies in a month.   2. Renal insufficiency: This has been chronic in nature and unlikely related to her plasma cell disorder.  3.  Pulmonary embolism: Currently anticoagulated with warfarin.  4. Follow-up: Will be in 8 months.  Zola Button, MD 2/9/20173:47 PM

## 2015-11-24 NOTE — Telephone Encounter (Signed)
Appointments made and avs printed for patient °

## 2015-11-29 ENCOUNTER — Encounter: Payer: Self-pay | Admitting: Podiatry

## 2015-11-29 ENCOUNTER — Ambulatory Visit (INDEPENDENT_AMBULATORY_CARE_PROVIDER_SITE_OTHER): Payer: Medicare Other | Admitting: Podiatry

## 2015-11-29 DIAGNOSIS — E1151 Type 2 diabetes mellitus with diabetic peripheral angiopathy without gangrene: Secondary | ICD-10-CM

## 2015-11-29 DIAGNOSIS — B351 Tinea unguium: Secondary | ICD-10-CM | POA: Diagnosis not present

## 2015-11-29 DIAGNOSIS — M79673 Pain in unspecified foot: Secondary | ICD-10-CM

## 2015-11-29 NOTE — Progress Notes (Signed)
Patient ID: Marvin EDMUNDSON Sr., male   DOB: October 27, 1919, 82 y.o.   MRN: [id number] HPI  Complaint:  Visit Type: Patient returns to my office for continued preventative foot care services. Complaint: Patient states" my nails have grown long and thick and become painful to walk and wear shoes" Patient has been diagnosed with DM . This patient  presents for preventative foot care services. No changes to ROS  Podiatric Exam: Vascular: dorsalis pedis and posterior tibial pulses are non-palpable. Capillary return is diminished. Cold feet noted.. Skin turgor WNL, bilateral swelling  Sensorium: Diminished  Semmes Weinstein monofilament test. Normal tactile sensation bilaterally.  Nail Exam: Pt has thick disfigured discolored nails with subungual debris noted bilateral entire nail hallux through fifth toenails Ulcer Exam: There is no evidence of ulcer or pre-ulcerative changes or infection. Orthopedic Exam: Muscle tone and strength are WNL. No limitations in general ROM. No crepitus or effusions noted. Foot type and digits show no abnormalities. Bony prominences are unremarkable. Skin: No Porokeratosis. No infection or ulcers  Diagnosis:  Onychomycosis, Pain in right toe, pain in left toes  Treatment & Plan Procedures and Treatment: Consent by patient was obtained for treatment procedures. The patient understood the discussion of treatment and procedures well. All questions were answered thoroughly reviewed. Debridement of mycotic and hypertrophic toenails, 1 through 5 bilateral and clearing of subungual debris. No ulceration, no infection noted.  Return Visit-Office Procedure: Patient instructed to return to the office for a follow up visit 3 months for continued evaluation and treatment.    Gardiner Barefoot DPM

## 2015-12-14 ENCOUNTER — Other Ambulatory Visit: Payer: Self-pay | Admitting: Nurse Practitioner

## 2015-12-16 ENCOUNTER — Other Ambulatory Visit: Payer: Self-pay | Admitting: Nurse Practitioner

## 2016-03-06 ENCOUNTER — Encounter: Payer: Self-pay | Admitting: Podiatry

## 2016-03-06 ENCOUNTER — Ambulatory Visit (INDEPENDENT_AMBULATORY_CARE_PROVIDER_SITE_OTHER): Payer: Medicare Other | Admitting: Podiatry

## 2016-03-06 DIAGNOSIS — B351 Tinea unguium: Secondary | ICD-10-CM | POA: Diagnosis not present

## 2016-03-06 DIAGNOSIS — E1151 Type 2 diabetes mellitus with diabetic peripheral angiopathy without gangrene: Secondary | ICD-10-CM | POA: Diagnosis not present

## 2016-03-06 DIAGNOSIS — M79673 Pain in unspecified foot: Secondary | ICD-10-CM | POA: Diagnosis not present

## 2016-03-06 NOTE — Progress Notes (Signed)
Patient ID: Marvin EDMUNDSON Sr., male   DOB: October 27, 1919, 82 y.o.   MRN: [id number] HPI  Complaint:  Visit Type: Patient returns to my office for continued preventative foot care services. Complaint: Patient states" my nails have grown long and thick and become painful to walk and wear shoes" Patient has been diagnosed with DM . This patient  presents for preventative foot care services. No changes to ROS  Podiatric Exam: Vascular: dorsalis pedis and posterior tibial pulses are non-palpable. Capillary return is diminished. Cold feet noted.. Skin turgor WNL, bilateral swelling  Sensorium: Diminished  Semmes Weinstein monofilament test. Normal tactile sensation bilaterally.  Nail Exam: Pt has thick disfigured discolored nails with subungual debris noted bilateral entire nail hallux through fifth toenails Ulcer Exam: There is no evidence of ulcer or pre-ulcerative changes or infection. Orthopedic Exam: Muscle tone and strength are WNL. No limitations in general ROM. No crepitus or effusions noted. Foot type and digits show no abnormalities. Bony prominences are unremarkable. Skin: No Porokeratosis. No infection or ulcers  Diagnosis:  Onychomycosis, Pain in right toe, pain in left toes  Treatment & Plan Procedures and Treatment: Consent by patient was obtained for treatment procedures. The patient understood the discussion of treatment and procedures well. All questions were answered thoroughly reviewed. Debridement of mycotic and hypertrophic toenails, 1 through 5 bilateral and clearing of subungual debris. No ulceration, no infection noted.  Return Visit-Office Procedure: Patient instructed to return to the office for a follow up visit 3 months for continued evaluation and treatment.    Gardiner Barefoot DPM

## 2016-03-29 ENCOUNTER — Ambulatory Visit (INDEPENDENT_AMBULATORY_CARE_PROVIDER_SITE_OTHER): Payer: Medicare Other | Admitting: Neurology

## 2016-03-29 ENCOUNTER — Encounter: Payer: Self-pay | Admitting: Neurology

## 2016-03-29 VITALS — BP 122/60 | HR 62 | Ht 73.0 in | Wt 227.0 lb

## 2016-03-29 DIAGNOSIS — F0390 Unspecified dementia without behavioral disturbance: Secondary | ICD-10-CM

## 2016-03-29 MED ORDER — MEMANTINE HCL 10 MG PO TABS: 10.0000 mg | ORAL_TABLET | Freq: Two times a day (BID) | ORAL | Status: DC

## 2016-03-29 NOTE — Progress Notes (Signed)
Chief Complaint  Patient presents with  . Rm 3  . Follow-up    Pt returns for 6 mo f/u of memory, here today w/ his daughter. Tolerating Namenda well. MMSE: 20/30     [facility]  PATIENT: Marvin Barns Sr. DOB: Feb 17, 1920  HISTORY OF PRESENT ILLNESS: 82 years old right-handed male, accompanied by her daughter, referred by her primary care physician Dr. Benita Stabile for evaluation of memory trouble  He had a past medical history of hypertension, diabetes, hyperlipidemia, prostate cancer, had a history of a left knee reconstruction surgery  He had sixth-years of education, is a retired Sports coach in 2007, he lives alone, still driving short distance, over the past one year, his daughter noticed that he has become forgetful, he forget to take his medications, he misplace things, he often gets out of his car, forgot to close his car door, he also has mild gait difficulty, using a cane.  He denies sleep difficulty, has mild gait difficulty, he denies bowel and bladder incontinence, he can still pay his own bill, cooking simple meals without significant difficulty. He still lives by himself, he still drives short distance, has not got lost yet.   UPDATE March 29 2016: He stopped his Aricept in August 2015 due to GI effect. He was placed on Namenda since 09/14/2014, he denies side effects . He continues to live in his home has 24/7 CNAs He is with  his daughter Justice Rocher who is his POA, she denies that there have been any safety issues identified.  MRI showed mild changes of chronic microvascular ischemia and generalized cerebral atrophy. he has good appetite, sleeps well.  REVIEW OF SYSTEMS: Full 14 system review of systems performed and notable only for those listed, all others are neg: memory loss  ALLERGIES: Allergies  Allergen Reactions  . Donepezil Nausea And Vomiting  . Hydrocodone Nausea And Vomiting    HOME MEDICATIONS: Outpatient Prescriptions Prior to  Visit  Medication Sig Dispense Refill  . BD PEN NEEDLE NANO U/F 32G X 4 MM MISC See admin instructions.  4  . bisacodyl (DULCOLAX) 10 MG suppository Place 1 suppository (10 mg total) rectally daily as needed for moderate constipation. 12 suppository 0  . docusate sodium 100 MG CAPS Take 200 mg by mouth 2 (two) times daily as needed for mild constipation. (Patient taking differently: Take 200 mg by mouth daily. ) 25 capsule 0  . esomeprazole (NEXIUM) 40 MG capsule Take 1 capsule (40 mg total) by mouth 2 (two) times daily before a meal. 60 capsule 1  . famotidine (PEPCID) 20 MG tablet Take 1 tablet (20 mg total) by mouth at bedtime. 30 tablet 1  . furosemide (LASIX) 20 MG tablet Take 20 mg by mouth See admin instructions. Take on Mondays Wednesday and Friday    . gabapentin (NEURONTIN) 100 MG capsule TAKE 2 CAPSULES BY MOUTH EVERY DAY AT BEDTIME  11  . glipiZIDE (GLUCOTROL) 10 MG tablet Take 10 mg by mouth 2 (two) times daily before a meal.     . insulin detemir (LEVEMIR) 100 UNIT/ML injection Inject 0.1 mLs (10 Units total) into the skin daily at 10 pm. (Patient taking differently: Inject 35 Units into the skin at bedtime. ) 10 mL 11  . Loratadine (CLARITIN) 10 MG CAPS Take 1 capsule by mouth daily.    . memantine (NAMENDA) 10 MG tablet Take 1 tablet (10 mg total) by mouth 2 (two) times daily. 60 tablet 6  .  metFORMIN (GLUCOPHAGE) 500 MG tablet Take 500 mg by mouth 2 (two) times daily.  11  . metoprolol tartrate (LOPRESSOR) 25 MG tablet Take 12.5 mg by mouth 2 (two) times daily.     . polyethylene glycol (MIRALAX / GLYCOLAX) packet Take 17 g by mouth daily.   6  . simvastatin (ZOCOR) 40 MG tablet Take 40 mg by mouth every morning.     Marland Kitchen UNABLE TO FIND Take 1 tablet by mouth 2 (two) times daily. Eldertonic - 15 ml - vitamin    . warfarin (COUMADIN) 5 MG tablet Take 7 mg by mouth daily.   5  . acetaminophen (TYLENOL) 325 MG tablet Take 2 tablets (650 mg total) by mouth every 6 (six) hours as needed  for mild pain (or Fever >/= 101).     No facility-administered medications prior to visit.    PAST MEDICAL HISTORY: Past Medical History  Diagnosis Date  . Arthritis   . Hypertension   . Hypercholesteremia   . Insomnia     occasional  . Nephrolithiasis     right UVJ stone  . Dementia   . Memory loss   . Headache(784.0)     slight  . Asthma     hx of as child  . PONV (postoperative nausea and vomiting)   . Complete loss of vision     right eye  . Peripheral neuropathy (HCC)     both legs  . Prostate cancer ([place])   . Diabetes mellitus     PAST SURGICAL HISTORY: Past Surgical History  Procedure Laterality Date  . Appendectomy    . Pars plana vitrectomy  10/24/2011    Procedure: PARS PLANA VITRECTOMY WITH 23 GAUGE;  Surgeon: Adonis Brook, MD;  Location: [facility];  Service: Ophthalmology;  Laterality: Right;  . Tonsillectomy  as teen  . Knee arthroscopy Right   . Hemorroidectomy    . Cystoscopy w/ ureteral stent placement Right 10/01/2013    Procedure: cystoscopy right retrograde holmium laser lithotripsy;  Surgeon: Franchot Gallo, MD;  Location: [facility];  Service: Urology;  Laterality: Right;  holmium laser application  . Cornea removed Right 02-26-15    FAMILY HISTORY: Family History  Problem Relation Age of Onset  . Cancer Mother     uterus  . Heart Problems Father   . Cancer Brother     stomach  . Cancer Brother     throat  . Colon cancer Brother     SOCIAL HISTORY: Social History   Social History  . Marital Status: Widowed    Spouse Name: N/A  . Number of Children: 5  . Years of Education: 6 th   Occupational History  .      Retired   Social History Main Topics  . Smoking status: Former Smoker -- 0.25 packs/day    Types: Cigarettes    Quit date: 10/15/1966  . Smokeless tobacco: Never Used  . Alcohol Use: 0.0 oz/week     Comment: occasional  . Drug Use: No  . Sexual Activity: No   Other Topics Concern  . Not on file   Social History  Narrative   Patient lives at home alone with dog.   Retired.   Education.- 6 th  Grade   Right handed.   Caffeine-Soda , tea and coffee                 PHYSICAL EXAM  Filed Vitals:   03/29/16 1608  BP: 122/60  Pulse: 62  Height: 6\' 1"  (1.854 m)  Weight: 227 lb (102.967 kg)   Body mass index is 29.96 kg/(m^2). Generalized: Well developed, in no acute distress  Head: normocephalic and atraumatic,. Oropharynx benign  Musculoskeletal: No deformity   Neurological examination  MENTAL STATUS: Speech:    Speech is normal; fluent and spontaneous with normal comprehension.  Cognition: MMSE 20/30     Orientation: he is not oriented to date, year, day.     Recent and remote memory: He missed 3/3 recalls     Attention span and concentration: he has difficulty spell WORLD backwards     Normal Language, naming, repeating,spontaneous speech     Fund of knowledge    Cranial nerve II-XII: Left Pupil was equal round reactive to light. Right eye removal  extraocular movements were full, visual field were full on confrontational test. Facial sensation and strength were normal. hearing was intact to finger rubbing bilaterally. Uvula tongue midline. head turning and shoulder shrug were normal and symmetric.Tongue protrusion into cheek strength was normal. Motor: normal bulk and tone, full strength in the BUE, BLE, fine finger movements normal, no pronator drift. No focal weakness Coordination: finger-nose-finger, heel-to-shin bilaterally, no dysmetria Reflexes: Brachioradialis 2/2, biceps 2/2, triceps 2/2, patellar 2/2, Achilles absent, plantar responses were flexor bilaterally. Gait and Station: pushup to get up from seated position, stooped posture with single-point cane   DIAGNOSTIC DATA (LABS, IMAGING, TESTING) - I reviewed patient records, labs, notes, testing and imaging myself where available.  Lab Results  Component Value Date   WBC 6.9 11/17/2015   HGB 13.9 11/17/2015   HCT  42.1 11/17/2015   MCV 83.0 11/17/2015   PLT 200 11/17/2015      Component Value Date/Time   NA 141 11/17/2015 1521   NA 142 06/24/2015 1747   K 4.6 11/17/2015 1521   K 3.8 06/24/2015 1747   CL 112* 06/24/2015 1747   CO2 25 11/17/2015 1521   CO2 25 06/24/2015 1747   GLUCOSE 187* 11/17/2015 1521   GLUCOSE 159* 06/24/2015 1747   BUN 18.3 11/17/2015 1521   BUN 18 06/24/2015 1747   CREATININE 1.5* 11/17/2015 1521   CREATININE 1.40* 06/24/2015 1747   CALCIUM 9.8 11/17/2015 1521   CALCIUM 8.8* 06/24/2015 1747   PROT 7.9 11/17/2015 1521   PROT 7.2 11/17/2015 1521   PROT 6.7 06/24/2015 1747   ALBUMIN 3.7 11/17/2015 1521   ALBUMIN 3.6 06/24/2015 1747   AST 18 11/17/2015 1521   AST 21 06/24/2015 1747   ALT 15 11/17/2015 1521   ALT 12* 06/24/2015 1747   ALKPHOS 83 11/17/2015 1521   ALKPHOS 65 06/24/2015 1747   BILITOT 0.58 11/17/2015 1521   BILITOT 0.3 06/24/2015 1747   GFRNONAA 42* 06/24/2015 1747   GFRAA 48* 06/24/2015 1747     Lab Results  Component Value Date   TSH 1.989 04/01/2015    ASSESSMENT AND PLAN  82 y.o. year old male    Dementia  MMSE today is 20/30  Continue Namenda at current dose Rx to daughter  Marcial Pacas. M.D. Ph.D.  [facility] [address], [place], [place] [address] [phone]

## 2016-05-15 ENCOUNTER — Ambulatory Visit (INDEPENDENT_AMBULATORY_CARE_PROVIDER_SITE_OTHER): Payer: Medicare Other | Admitting: Podiatry

## 2016-05-15 DIAGNOSIS — B351 Tinea unguium: Secondary | ICD-10-CM | POA: Diagnosis not present

## 2016-05-15 DIAGNOSIS — E1151 Type 2 diabetes mellitus with diabetic peripheral angiopathy without gangrene: Secondary | ICD-10-CM | POA: Diagnosis not present

## 2016-05-15 DIAGNOSIS — M79673 Pain in unspecified foot: Secondary | ICD-10-CM | POA: Diagnosis not present

## 2016-05-15 NOTE — Progress Notes (Signed)
Patient ID: Marvin SALDIERNA Sr., male   DOB: 19-Nov-1919, 82 y.o.   MRN: [id number] HPI  Complaint:  Visit Type: Patient returns to my office for continued preventative foot care services. Complaint: Patient states" my nails have grown long and thick and become painful to walk and wear shoes" Patient has been diagnosed with DM . This patient  presents for preventative foot care services. No changes to ROS  Podiatric Exam: Vascular: dorsalis pedis and posterior tibial pulses are non-palpable. Capillary return is diminished. Cold feet noted.. Skin turgor WNL, bilateral swelling  Sensorium: Diminished  Semmes Weinstein monofilament test. Normal tactile sensation bilaterally.  Nail Exam: Pt has thick disfigured discolored nails with subungual debris noted bilateral entire nail hallux through fifth toenails Ulcer Exam: There is no evidence of ulcer or pre-ulcerative changes or infection. Orthopedic Exam: Muscle tone and strength are WNL. No limitations in general ROM. No crepitus or effusions noted. Foot type and digits show no abnormalities. Bony prominences are unremarkable. Skin: No Porokeratosis. No infection or ulcers  Diagnosis:  Onychomycosis, Pain in right toe, pain in left toes  Treatment & Plan Procedures and Treatment: Consent by patient was obtained for treatment procedures. The patient understood the discussion of treatment and procedures well. All questions were answered thoroughly reviewed. Debridement of mycotic and hypertrophic toenails, 1 through 5 bilateral and clearing of subungual debris. No ulceration, no infection noted.  Return Visit-Office Procedure: Patient instructed to return to the office for a follow up visit 3 months for continued evaluation and treatment.    Gardiner Barefoot DPM

## 2016-07-05 ENCOUNTER — Telehealth: Payer: Self-pay | Admitting: *Deleted

## 2016-07-05 NOTE — Telephone Encounter (Signed)
Opened in error

## 2016-07-05 NOTE — Telephone Encounter (Signed)
Memantine PA approved by [employer] [id number]) through 10/14/16 - [id number] - pt [id number].

## 2016-07-22 ENCOUNTER — Telehealth: Payer: Self-pay | Admitting: Oncology

## 2016-07-22 NOTE — Telephone Encounter (Signed)
Lvm advising appt time chg on 10/17 from 3pm to 12.30 due to md call.

## 2016-07-24 ENCOUNTER — Other Ambulatory Visit (HOSPITAL_BASED_OUTPATIENT_CLINIC_OR_DEPARTMENT_OTHER): Payer: Medicare Other

## 2016-07-24 DIAGNOSIS — D472 Monoclonal gammopathy: Secondary | ICD-10-CM | POA: Diagnosis not present

## 2016-07-24 LAB — CBC WITH DIFFERENTIAL/PLATELET
BASO%: 0.5 % (ref 0.0–2.0)
BASOS ABS: 0 10*3/uL (ref 0.0–0.1)
EOS ABS: 0.1 10*3/uL (ref 0.0–0.5)
EOS%: 1.8 % (ref 0.0–7.0)
HEMATOCRIT: 41 % (ref 38.4–49.9)
HGB: 13.6 g/dL (ref 13.0–17.1)
LYMPH%: 25.5 % (ref 14.0–49.0)
MCH: 26.9 pg — AB (ref 27.2–33.4)
MCHC: 33.2 g/dL (ref 32.0–36.0)
MCV: 81.2 fL (ref 79.3–98.0)
MONO#: 0.8 10*3/uL (ref 0.1–0.9)
MONO%: 10.9 % (ref 0.0–14.0)
NEUT%: 61.3 % (ref 39.0–75.0)
NEUTROS ABS: 4.5 10*3/uL (ref 1.5–6.5)
PLATELETS: 230 10*3/uL (ref 140–400)
RBC: 5.05 10*6/uL (ref 4.20–5.82)
RDW: 15 % — AB (ref 11.0–14.6)
WBC: 7.3 10*3/uL (ref 4.0–10.3)
lymph#: 1.9 10*3/uL (ref 0.9–3.3)

## 2016-07-24 LAB — COMPREHENSIVE METABOLIC PANEL
ALBUMIN: 3.4 g/dL — AB (ref 3.5–5.0)
ALK PHOS: 87 U/L (ref 40–150)
ALT: 16 U/L (ref 0–55)
ANION GAP: 9 meq/L (ref 3–11)
AST: 19 U/L (ref 5–34)
BILIRUBIN TOTAL: 0.35 mg/dL (ref 0.20–1.20)
BUN: 28.2 mg/dL — ABNORMAL HIGH (ref 7.0–26.0)
CALCIUM: 9.7 mg/dL (ref 8.4–10.4)
CO2: 27 mEq/L (ref 22–29)
Chloride: 108 mEq/L (ref 98–109)
Creatinine: 2 mg/dL — ABNORMAL HIGH (ref 0.7–1.3)
EGFR: 33 mL/min/{1.73_m2} — AB (ref >90)
Glucose: 203 mg/dl — ABNORMAL HIGH (ref 70–140)
POTASSIUM: 4.3 meq/L (ref 3.5–5.1)
SODIUM: 144 meq/L (ref 136–145)
TOTAL PROTEIN: 7.9 g/dL (ref 6.4–8.3)

## 2016-07-25 LAB — KAPPA/LAMBDA LIGHT CHAINS
IG LAMBDA FREE LIGHT CHAIN: 29.6 mg/L — AB (ref 5.7–26.3)
Ig Kappa Free Light Chain: 110.6 mg/L — ABNORMAL HIGH (ref 3.3–19.4)
KAPPA/LAMBDA FLC RATIO: 3.74 — AB (ref 0.26–1.65)

## 2016-07-27 LAB — MULTIPLE MYELOMA PANEL, SERUM
ALBUMIN SERPL ELPH-MCNC: 3.4 g/dL (ref 2.9–4.4)
ALPHA 1: 0.2 g/dL (ref 0.0–0.4)
ALPHA2 GLOB SERPL ELPH-MCNC: 0.9 g/dL (ref 0.4–1.0)
Albumin/Glob SerPl: 0.9 (ref 0.7–1.7)
B-GLOBULIN SERPL ELPH-MCNC: 1.1 g/dL (ref 0.7–1.3)
Gamma Glob SerPl Elph-Mcnc: 1.7 g/dL (ref 0.4–1.8)
Globulin, Total: 3.9 g/dL (ref 2.2–3.9)
IGM (IMMUNOGLOBIN M), SRM: 128 mg/dL (ref 15–143)
IgA, Qn, Serum: 199 mg/dL (ref 61–437)
M PROTEIN SERPL ELPH-MCNC: 0.8 g/dL — AB
TOTAL PROTEIN: 7.3 g/dL (ref 6.0–8.5)

## 2016-07-31 ENCOUNTER — Ambulatory Visit (HOSPITAL_COMMUNITY)
Admission: RE | Admit: 2016-07-31 | Discharge: 2016-07-31 | Disposition: A | Discharge status: Home / Self Care | Payer: Medicare Other | Source: Ambulatory Visit | Attending: Oncology | Admitting: Oncology

## 2016-07-31 ENCOUNTER — Ambulatory Visit (HOSPITAL_BASED_OUTPATIENT_CLINIC_OR_DEPARTMENT_OTHER): Payer: Medicare Other | Admitting: Oncology

## 2016-07-31 VITALS — BP 119/72 | HR 92 | Temp 97.3°F | Resp 17 | Ht 73.0 in | Wt 228.0 lb

## 2016-07-31 DIAGNOSIS — D729 Disorder of white blood cells, unspecified: Secondary | ICD-10-CM

## 2016-07-31 DIAGNOSIS — N289 Disorder of kidney and ureter, unspecified: Secondary | ICD-10-CM

## 2016-07-31 DIAGNOSIS — D472 Monoclonal gammopathy: Secondary | ICD-10-CM | POA: Diagnosis not present

## 2016-07-31 DIAGNOSIS — I2699 Other pulmonary embolism without acute cor pulmonale: Secondary | ICD-10-CM

## 2016-07-31 DIAGNOSIS — E119 Type 2 diabetes mellitus without complications: Secondary | ICD-10-CM | POA: Diagnosis not present

## 2016-07-31 NOTE — Progress Notes (Signed)
Hematology and Oncology Follow Up Visit  Marvin Bishop [id number] 1920-01-09 82 y.o. 07/31/2016 12:49 PM   Principle Diagnosis: 82 year old gentleman with monoclonal gammopathy with IgG kappa subtype diagnosed in July 2016. He was found to have an M spike of 0.6 g/dL and a normal IgG level. He had an elevated light chain ratio. No evidence of clear-cut end organ damage on his initial workup.  Current therapy: Observation and surveillance.  Interim History:  Marvin Bishop presents today for a follow-up visit with his family. Since the last visit, he report increased symptoms of lower extremity edema, weight gain and fluid retention. He does not report any physical complaints today including pain or discomfort. Has not reported any pathological fractures or opportunistic infections. His appetite remains excellent without any major decline in his energy her performance status. He continues to have memory issues and follows with nephrology regarding that.  He does not report any headaches, blurry vision, syncope or seizures. He does not report any fevers or chills or sweats. He does not report any cough or hemoptysis or hematemesis. Does not report any nausea, vomiting or abdominal pain. Does not report any constipation, diarrhea or hematochezia. He does not report any frequency urgency or hesitancy. He does not report any skeletal complaints. Remainder review of systems unremarkable.   Medications: I have reviewed the patient's current medications.  Current Outpatient Prescriptions  Medication Sig Dispense Refill  . BD PEN NEEDLE NANO U/F 32G X 4 MM MISC See admin instructions.  4  . bisacodyl (DULCOLAX) 10 MG suppository Place 1 suppository (10 mg total) rectally daily as needed for moderate constipation. 12 suppository 0  . docusate sodium 100 MG CAPS Take 200 mg by mouth 2 (two) times daily as needed for mild constipation. (Patient taking differently: Take 200 mg by mouth daily. ) 25 capsule 0   . esomeprazole (NEXIUM) 40 MG capsule Take 1 capsule (40 mg total) by mouth 2 (two) times daily before a meal. 60 capsule 1  . famotidine (PEPCID) 20 MG tablet Take 1 tablet (20 mg total) by mouth at bedtime. 30 tablet 1  . gabapentin (NEURONTIN) 100 MG capsule TAKE 2 CAPSULES BY MOUTH EVERY DAY AT BEDTIME  11  . glipiZIDE (GLUCOTROL) 10 MG tablet Take 10 mg by mouth 2 (two) times daily before a meal.     . HUMALOG KWIKPEN 100 UNIT/ML KiwkPen     . insulin detemir (LEVEMIR) 100 UNIT/ML injection Inject 0.1 mLs (10 Units total) into the skin daily at 10 pm. (Patient taking differently: Inject 35 Units into the skin at bedtime. ) 10 mL 11  . Loratadine (CLARITIN) 10 MG CAPS Take 1 capsule by mouth daily.    . memantine (NAMENDA) 10 MG tablet Take 1 tablet (10 mg total) by mouth 2 (two) times daily. 180 tablet 4  . metFORMIN (GLUCOPHAGE) 500 MG tablet Take 500 mg by mouth 2 (two) times daily.  11  . metoprolol tartrate (LOPRESSOR) 25 MG tablet Take 12.5 mg by mouth 2 (two) times daily.     . polyethylene glycol (MIRALAX / GLYCOLAX) packet Take 17 g by mouth daily.   6  . simvastatin (ZOCOR) 40 MG tablet Take 40 mg by mouth every morning.     Marland Kitchen UNABLE TO FIND Take 1 tablet by mouth 2 (two) times daily. Eldertonic - 15 ml - vitamin    . warfarin (COUMADIN) 5 MG tablet Take 7 mg by mouth daily.   5   No current  facility-administered medications for this visit.      Allergies:  Allergies  Allergen Reactions  . Donepezil Nausea And Vomiting  . Hydrocodone Nausea And Vomiting    Past Medical History, Surgical history, Social history, and Family History were reviewed and updated.  Physical Exam: Blood pressure 119/72, pulse 92, temperature 97.3 F (36.3 C), temperature source Oral, resp. rate 17, height _0  (1.854 m), weight 228 lb (103.4 kg), SpO2 100 %. ECOG: 2 General appearance: alert and cooperative appeared without distress today. Head: Normocephalic, without obvious abnormality oral  ulcers or lesions. Neck: no adenopathy Lymph nodes: Cervical, supraclavicular, and axillary nodes normal. Heart:regular rate and rhythm, S1, S2 normal, no murmur, click, rub or gallop Lung:chest clear, no wheezing, rales, normal symmetric air entry Abdomin: soft, non-tender, without masses or organomegaly. Slightly distended. EXT: Edema noted bilaterally.   Lab Results: Lab Results  Component Value Date   WBC 7.3 07/24/2016   HGB 13.6 07/24/2016   HCT 41.0 07/24/2016   MCV 81.2 07/24/2016   PLT 230 07/24/2016     Chemistry      Component Value Date/Time   NA 144 07/24/2016 1503   K 4.3 07/24/2016 1503   CL 112 (H) 06/24/2015 1747   CO2 27 07/24/2016 1503   BUN 28.2 (H) 07/24/2016 1503   CREATININE 2.0 (H) 07/24/2016 1503      Component Value Date/Time   CALCIUM 9.7 07/24/2016 1503   ALKPHOS 87 07/24/2016 1503   AST 19 07/24/2016 1503   ALT 16 07/24/2016 1503   BILITOT 0.35 07/24/2016 1503     Results for Marvin Bishop, Marvin SR. (MRN [id number]) as of 07/31/2016 12:51  Ref. Range 11/17/2015 15:21 07/24/2016 15:03 07/24/2016 15:03  IgG (Immunoglobin G), Serum Latest Ref Range: 700 - 1600 mg/dL 1,524  1,639 (H)  IgM, Qn, Serum Latest Ref Range: 15 - 143 mg/dL 117  128  M Protein SerPl Elph-Mcnc Latest Ref Range: Not Observed g/dL 0.6 (H)  0.8 (H)  Results for Marvin Bishop, Marvin Bishop (MRN [id number]) as of 07/31/2016 12:51  Ref. Range 11/17/2015 15:21 07/24/2016 15:03 07/24/2016 15:03 07/24/2016 15:03  Creatinine Latest Ref Range: 0.7 - 1.3 mg/dL 1.5 (H)   2.34 (H)    82 year old gentleman with the following issues:  1. Monoclonal gammopathy with IgG kappa subtype diagnosed in July 2016  after presenting with increased kappa to lambda ratio of 2.68. His protein studies Obtained on October 10 of thousand 17 compared to February 2017 showed slight increase in his M spike up to 0.8 g/dL. His IgG level has also increased to 1600 which is slightly abnormal. He continues to use show normal  hemoglobin and no symptomatology. His creatinine did increase in the last 6 months to 2.0.  The differential diagnosis includes monoclonal gammopathy of undetermined significance versus multiple myeloma. His M spike remains reasonably well although he was changing with worsening renal insufficiency. Given these findings I have discussed with him the risks and benefits of a bone marrow biopsy to evaluate him for symptomatic multiple myeloma. Complications include pain, infection and bleeding were reviewed and he is agreeable to proceed. We will also obtain a skeletal survey to rule out lytic bone lesions.  2. Renal insufficiency: Likely unrelated to a plasma cell disorder and could be related to his long-standing diabetes. Plasma cell disorder will be evaluated further with a bone marrow biopsy to rule out symptomatic myeloma.  3. Pulmonary embolism: Currently anticoagulated with warfarin.  4. Follow-up: Will be pending the results  of his workup. If he shows no evidence of symptomatic myeloma based on skeletal survey and bone marrow biopsy, repeat protein studies will be done in 6 months. If abnormalities are detected on his skeletal survey or his bone marrow biopsy, his follow-up will be sooner.  Zola Button, MD 10/17/201712:49 PM

## 2016-08-05 ENCOUNTER — Other Ambulatory Visit: Payer: Self-pay | Admitting: Radiology

## 2016-08-07 ENCOUNTER — Ambulatory Visit (HOSPITAL_COMMUNITY)
Admission: RE | Admit: 2016-08-07 | Discharge: 2016-08-07 | Disposition: A | Discharge status: Home / Self Care | Payer: Medicare Other | Source: Ambulatory Visit | Attending: Oncology | Admitting: Oncology

## 2016-08-07 ENCOUNTER — Encounter (HOSPITAL_COMMUNITY): Payer: Self-pay

## 2016-08-07 DIAGNOSIS — D729 Disorder of white blood cells, unspecified: Secondary | ICD-10-CM | POA: Insufficient documentation

## 2016-08-07 DIAGNOSIS — D72822 Plasmacytosis: Secondary | ICD-10-CM | POA: Diagnosis not present

## 2016-08-07 LAB — BASIC METABOLIC PANEL
Anion gap: 6 (ref 5–15)
BUN: 24 mg/dL — AB (ref 6–20)
CHLORIDE: 109 mmol/L (ref 101–111)
CO2: 26 mmol/L (ref 22–32)
Calcium: 9.3 mg/dL (ref 8.9–10.3)
Creatinine, Ser: 1.54 mg/dL — ABNORMAL HIGH (ref 0.61–1.24)
GFR calc non Af Amer: 37 mL/min — ABNORMAL LOW (ref >60)
GFR, EST AFRICAN AMERICAN: 43 mL/min — AB (ref >60)
Glucose, Bld: 167 mg/dL — ABNORMAL HIGH (ref 65–99)
POTASSIUM: 3.9 mmol/L (ref 3.5–5.1)
SODIUM: 141 mmol/L (ref 135–145)

## 2016-08-07 LAB — CBC WITH DIFFERENTIAL/PLATELET
Basophils Absolute: 0 10*3/uL (ref 0.0–0.1)
Basophils Relative: 1 %
Eosinophils Absolute: 0.3 10*3/uL (ref 0.0–0.7)
Eosinophils Relative: 4 %
HCT: 41.9 % (ref 39.0–52.0)
Hemoglobin: 13.7 g/dL (ref 13.0–17.0)
Lymphocytes Relative: 21 %
Lymphs Abs: 1.6 10*3/uL (ref 0.7–4.0)
MCH: 26.9 pg (ref 26.0–34.0)
MCHC: 32.7 g/dL (ref 30.0–36.0)
MCV: 82.3 fL (ref 78.0–100.0)
Monocytes Absolute: 1.1 10*3/uL — ABNORMAL HIGH (ref 0.1–1.0)
Monocytes Relative: 14 %
Neutro Abs: 4.6 10*3/uL (ref 1.7–7.7)
Neutrophils Relative %: 60 %
Platelets: 221 10*3/uL (ref 150–400)
RBC: 5.09 MIL/uL (ref 4.22–5.81)
RDW: 15.1 % (ref 11.5–15.5)
WBC: 7.5 10*3/uL (ref 4.0–10.5)

## 2016-08-07 LAB — GLUCOSE, CAPILLARY: Glucose-Capillary: 164 mg/dL — ABNORMAL HIGH (ref 65–99)

## 2016-08-07 LAB — BONE MARROW EXAM

## 2016-08-07 LAB — PROTIME-INR
INR: 2.35
Prothrombin Time: 26.2 seconds — ABNORMAL HIGH (ref 11.4–15.2)

## 2016-08-07 MED ORDER — FENTANYL CITRATE (PF) 100 MCG/2ML IJ SOLN
INTRAMUSCULAR | Status: AC
  Filled 2016-08-07: qty 4

## 2016-08-07 MED ORDER — SODIUM CHLORIDE 0.9 % IV SOLN
INTRAVENOUS | Status: DC
  Administered 2016-08-07: 09:00:00 via INTRAVENOUS

## 2016-08-07 MED ORDER — MIDAZOLAM HCL 2 MG/2ML IJ SOLN
INTRAMUSCULAR | Status: AC | PRN
  Administered 2016-08-07 (×2): 1 mg via INTRAVENOUS

## 2016-08-07 MED ORDER — FENTANYL CITRATE (PF) 100 MCG/2ML IJ SOLN
INTRAMUSCULAR | Status: AC | PRN
  Administered 2016-08-07: 50 ug via INTRAVENOUS

## 2016-08-07 MED ORDER — MIDAZOLAM HCL 2 MG/2ML IJ SOLN
INTRAMUSCULAR | Status: AC
  Filled 2016-08-07: qty 4

## 2016-08-07 NOTE — Procedures (Signed)
Interventional Radiology Procedure Note  Procedure: CT guided aspirate and core biopsy of right iliac bone Complications: None Recommendations: - Bedrest supine x 1 hr - Follow biopsy results  Jager Koska T. Kiondra Caicedo, M.D Pager:  [phone]   

## 2016-08-07 NOTE — H&P (Signed)
Chief Complaint: Patient was seen in consultation today for bone marrow biopsy at the request of [name],Marvin Bishop  Referring Physician(s): Wyatt Portela  Supervising Physician: Aletta Edouard  Patient Status: [name] - Out-pt  History of Present Illness: Marvin Bishop. is a 82 y.o. male with monoclonal gammopathy. He is referred for bone marrow biopsy. PMHx, meds, labs, allergies reviewed. Has been NPO this am. Family at bedside  Past Medical History:  Diagnosis Date  . Arthritis   . Asthma    hx of as child  . Complete loss of vision    right eye  . Dementia   . Diabetes mellitus   . Headache(784.0)    slight  . Hypercholesteremia   . Hypertension   . Insomnia    occasional  . Memory loss   . Nephrolithiasis    right UVJ stone  . Peripheral neuropathy (HCC)    both legs  . PONV (postoperative nausea and vomiting)   . Prostate cancer [name])     Past Surgical History:  Procedure Laterality Date  . APPENDECTOMY    . Cornea removed Right 02-26-15  . CYSTOSCOPY W/ URETERAL STENT PLACEMENT Right 10/01/2013   Procedure: cystoscopy right retrograde holmium laser lithotripsy;  Surgeon: Franchot Gallo, MD;  Location: [facility];  Service: Urology;  Laterality: Right;  holmium laser application  . HEMORROIDECTOMY    . KNEE ARTHROSCOPY Right   . PARS PLANA VITRECTOMY  10/24/2011   Procedure: PARS PLANA VITRECTOMY WITH 23 GAUGE;  Surgeon: Adonis Brook, MD;  Location: [facility];  Service: Ophthalmology;  Laterality: Right;  . TONSILLECTOMY  as teen    Allergies: Donepezil and Hydrocodone  Medications: Prior to Admission medications   Medication Sig Start Date End Date Taking? Authorizing Provider  docusate sodium 100 MG CAPS Take 200 mg by mouth 2 (two) times daily as needed for mild constipation. Patient taking differently: Take 200 mg by mouth daily.  08/25/14  Yes Thurnell Lose, MD  esomeprazole (NEXIUM) 40 MG capsule Take 1 capsule (40 mg total) by mouth 2 (two)  times daily before a meal. 04/04/15  Yes Barton Dubois, MD  famotidine (PEPCID) 20 MG tablet Take 1 tablet (20 mg total) by mouth at bedtime. 04/04/15  Yes Barton Dubois, MD  furosemide (LASIX) 40 MG tablet Take 40 mg by mouth daily.   Yes Historical Provider, MD  gabapentin (NEURONTIN) 100 MG capsule TAKE 2 CAPSULES BY MOUTH EVERY DAY AT BEDTIME 09/21/15  Yes Historical Provider, MD  glipiZIDE (GLUCOTROL) 10 MG tablet Take 10 mg by mouth 2 (two) times daily before a meal.    Yes Historical Provider, MD  insulin detemir (LEVEMIR) 100 UNIT/ML injection Inject 0.1 mLs (10 Units total) into the skin daily at 10 pm. Patient taking differently: Inject 35 Units into the skin at bedtime.  04/10/15  Yes Silver Huguenin Elgergawy, MD  Loratadine (CLARITIN) 10 MG CAPS Take 1 capsule by mouth daily.   Yes Historical Provider, MD  memantine (NAMENDA) 10 MG tablet Take 1 tablet (10 mg total) by mouth 2 (two) times daily. 03/29/16  Yes Marcial Pacas, MD  metoprolol tartrate (LOPRESSOR) 25 MG tablet Take 12.5 mg by mouth 2 (two) times daily.  06/23/15  Yes Historical Provider, MD  polyethylene glycol (MIRALAX / GLYCOLAX) packet Take 17 g by mouth daily.  04/29/15  Yes Historical Provider, MD  simvastatin (ZOCOR) 40 MG tablet Take 40 mg by mouth every morning.    Yes Historical Provider, MD  UNABLE TO FIND Take 1 tablet by mouth 2 (two) times daily. Eldertonic - 15 ml - vitamin   Yes Historical Provider, MD  warfarin (COUMADIN) 5 MG tablet Take 7 mg by mouth daily.  11/16/15  Yes Historical Provider, MD  BD PEN NEEDLE NANO U/F 32G X 4 MM MISC See admin instructions. 07/13/15   Historical Provider, MD  bisacodyl (DULCOLAX) 10 MG suppository Place 1 suppository (10 mg total) rectally daily as needed for moderate constipation. 04/10/15   Marvin Patricia, MD  HUMALOG KWIKPEN 100 UNIT/ML Mayer Masker  06/08/16   Historical Provider, MD  metFORMIN (GLUCOPHAGE) 500 MG tablet Take 500 mg by mouth 2 (two) times daily. 11/14/15   Historical Provider,  MD     Family History  Problem Relation Age of Onset  . Cancer Mother     uterus  . Heart Problems Father   . Cancer Brother     stomach  . Cancer Brother     throat  . Colon cancer Brother     Social History   Social History  . Marital status: Widowed    Spouse name: Bishop/A  . Number of children: 5  . Years of education: 6 th   Occupational History  .      Retired   Social History Main Topics  . Smoking status: Former Smoker    Packs/day: 0.25    Types: Cigarettes    Quit date: 10/15/1966  . Smokeless tobacco: Never Used  . Alcohol use 0.0 oz/week     Comment: occasional  . Drug use: No  . Sexual activity: No   Other Topics Concern  . None   Social History Narrative   Patient lives at home alone with dog.   Retired.   Education.- 6 th  Grade   Right handed.   Caffeine-Soda , tea and coffee                 Review of Systems: A 12 point ROS discussed and pertinent positives are indicated in the HPI above.  All other systems are negative.  Review of Systems  Vital Signs: BP (!) 145/85   Pulse 94   Temp 97.7 F (36.5 C) (Oral)   Resp (!) 22   SpO2 100%   Physical Exam  Constitutional: He appears well-developed and well-nourished. No distress.  HENT:  Head: Normocephalic.  Mouth/Throat: Oropharynx is clear and moist.  Neck: Normal range of motion. No JVD present. No tracheal deviation present.  Cardiovascular: Normal rate, regular rhythm and normal heart sounds.   Pulmonary/Chest: Effort normal and breath sounds normal. No respiratory distress. He has no wheezes. He has no rales.  Abdominal: Soft. There is no tenderness.  Neurological: He is alert.  Skin: Skin is warm and dry.  Psychiatric: He has a normal mood and affect.      Mallampati Score:  MD Evaluation Airway: WNL Heart: WNL Abdomen: WNL Chest/ Lungs: WNL ASA  Classification: 2 Mallampati/Airway Score: Two  Imaging: Dg Bone Survey Met  Result Date: 07/31/2016 CLINICAL  DATA:  Plasma cell disorder. EXAM: METASTATIC BONE SURVEY COMPARISON:  CT 06/24/2015, 04/01/2015.  Chest x-ray 06/24/2015. FINDINGS: Imaging of the axial and appendicular skeleton obtained . Diffuse prominent osteopenia. Diffuse degenerative changes noted throughout the axial and appendicular skeleton including severe degenerative changes throughout the spine. No focal lytic lesions are identified. Tiny calcified granuloma noted over the right lung. Aortoiliac atherosclerotic vascular disease . IMPRESSION: No focal lytic lesions noted. Electronically Signed  By: [place]   On: 07/31/2016 16:07    Labs:  CBC:  Recent Labs  11/17/15 1521 07/24/16 1503 08/07/16 0720  WBC 6.9 7.3 7.5  HGB 13.9 13.6 13.7  HCT 42.1 41.0 41.9  PLT 200 230 221    COAGS:  Recent Labs  08/07/16 0720  INR 2.35    BMP:  Recent Labs  11/17/15 1521 07/24/16 1503 08/07/16 0720  NA 141 144 141  K 4.6 4.3 3.9  CL  --   --  109  CO2 _0 GLUCOSE 187* 203* 167*  BUN 18.3 28.2* 24*  CALCIUM 9.8 9.7 9.3  CREATININE 1.5* 2.0* 1.54*  GFRNONAA  --   --  37*  GFRAA  --   --  43*    LIVER FUNCTION TESTS:  Recent Labs  11/17/15 1521 07/24/16 1503 07/24/16 1503  BILITOT 0.58  --  0.35  AST 18  --  19  ALT 15  --  16  ALKPHOS 83  --  87  PROT 7.9  7.2 7.3 7.9  ALBUMIN 3.7  --  3.4*    TUMOR MARKERS: No results for input(s): AFPTM, CEA, CA199, CHROMGRNA in the last 8760 hours.  Assessment and Plan: Monoclonal gammopathy For Bone marrow biopsy Labs reviewed. Risks and Benefits discussed with the patient including, but not limited to bleeding, infection, damage to adjacent structures or low yield requiring additional tests. All of the patient's questions were answered, patient is agreeable to proceed. Consent signed and in chart.    Thank you for this interesting consult.  I greatly enjoyed meeting DEONE LEIFHEIT Sr. and look forward to participating in their care.  A copy of  this report was sent to the requesting provider on this date.  Electronically Signed: Ascencion Dike 08/07/2016, 8:24 AM   I spent a total of 20 minutes in face to face in clinical consultation, greater than 50% of which was counseling/coordinating care for bone marrow biopsy

## 2016-08-07 NOTE — Sedation Documentation (Signed)
Patient is resting comfortably. 

## 2016-08-07 NOTE — Discharge Instructions (Addendum)
Quiet rest remainder of day. No change in diet or medications. Tylenol if need for pain. Leave dressing on for 24 hours then may remove and bathe as usual. Call [facility] Radiology for pain not relieved by Tylenol or bleeding at site   [phone]   Bone Marrow Aspiration and Bone Marrow Biopsy, Care After Refer to this sheet in the next few weeks. These instructions provide you with information about caring for yourself after your procedure. Your health care provider may also give you more specific instructions. Your treatment has been planned according to current medical practices, but problems sometimes occur. Call your health care provider if you have any problems or questions after your procedure. WHAT TO EXPECT AFTER THE PROCEDURE After your procedure, it is common to have:  Soreness or tenderness around the puncture site.  Bruising. HOME CARE INSTRUCTIONS  Take medicines only as directed by your health care provider.  Follow your health care provider's instructions about:  Puncture site care.  Bandage (dressing) changes and removal.  Bathe and shower as directed by your health care provider.  Check your puncture site every day for signs of infection. Watch for:  Redness, swelling, or pain.  Fluid, blood, or pus.  Return to your normal activities as directed by your health care provider.  Keep all follow-up visits as directed by your health care provider. This is important. SEEK MEDICAL CARE IF:  You have a fever.  You have uncontrollable bleeding.  You have redness, swelling, or pain at the site of your puncture.  You have fluid, blood, or pus coming from your puncture site.   This information is not intended to replace advice given to you by your health care provider. Make sure you discuss any questions you have with your health care provider.   Document Released: 04/20/2005 Document Revised: 02/15/2015 Document Reviewed: 09/22/2014 Elsevier Interactive  Patient Education 2016 Elsevier Inc. Moderate Conscious Sedation, Adult, Care After Refer to this sheet in the next few weeks. These instructions provide you with information on caring for yourself after your procedure. Your health care provider may also give you more specific instructions. Your treatment has been planned according to current medical practices, but problems sometimes occur. Call your health care provider if you have any problems or questions after your procedure. WHAT TO EXPECT AFTER THE PROCEDURE  After your procedure:  You may feel sleepy, clumsy, and have poor balance for several hours.  Vomiting may occur if you eat too soon after the procedure. HOME CARE INSTRUCTIONS  Do not participate in any activities where you could become injured for at least 24 hours. Do not:  Drive.  Swim.  Ride a bicycle.  Operate heavy machinery.  Cook.  Use power tools.  Climb ladders.  Work from a high place.  Do not make important decisions or sign legal documents until you are improved.  If you vomit, drink water, juice, or soup when you can drink without vomiting. Make sure you have little or no nausea before eating solid foods.  Only take over-the-counter or prescription medicines for pain, discomfort, or fever as directed by your health care provider.  Make sure you and your family fully understand everything about the medicines given to you, including what side effects may occur.  You should not drink alcohol, take sleeping pills, or take medicines that cause drowsiness for at least 24 hours.  If you smoke, do not smoke without supervision.  If you are feeling better, you may resume normal activities  24 hours after you were sedated.  Keep all appointments with your health care provider. SEEK MEDICAL CARE IF:  Your skin is pale or bluish in color.  You continue to feel nauseous or vomit.  Your pain is getting worse and is not helped by medicine.  You have  bleeding or swelling.  You are still sleepy or feeling clumsy after 24 hours. SEEK IMMEDIATE MEDICAL CARE IF:  You develop a rash.  You have difficulty breathing.  You develop any type of allergic problem.  You have a fever. MAKE SURE YOU:  Understand these instructions.  Will watch your condition.  Will get help right away if you are not doing well or get worse.   This information is not intended to replace advice given to you by your health care provider. Make sure you discuss any questions you have with your health care provider.   Document Released: 07/22/2013 Document Revised: 10/22/2014 Document Reviewed: 07/22/2013 Elsevier Interactive Patient Education [employer].

## 2016-08-09 ENCOUNTER — Telehealth: Payer: Self-pay | Admitting: *Deleted

## 2016-08-09 NOTE — Telephone Encounter (Signed)
As noted below by Dr. Alen Blew, I informed Marvin Bishop, daughter, that his Xray and bone marrow are good. Please follow up 01/2017 as scheduled. Olivia verbalized understanding.

## 2016-08-09 NOTE — Telephone Encounter (Signed)
-----   Message from Wyatt Portela, MD sent at 08/09/2016  1:24 PM EDT ----- Please let his daughter know that his Xray and bone marrow are good. Follow up in 01/2017 as scheduled.

## 2016-08-14 ENCOUNTER — Ambulatory Visit (INDEPENDENT_AMBULATORY_CARE_PROVIDER_SITE_OTHER): Payer: Medicare Other | Admitting: Podiatry

## 2016-08-14 ENCOUNTER — Encounter: Payer: Self-pay | Admitting: Podiatry

## 2016-08-14 VITALS — BP 136/69 | HR 73 | Resp 14

## 2016-08-14 DIAGNOSIS — E1151 Type 2 diabetes mellitus with diabetic peripheral angiopathy without gangrene: Secondary | ICD-10-CM

## 2016-08-14 DIAGNOSIS — B351 Tinea unguium: Secondary | ICD-10-CM | POA: Diagnosis not present

## 2016-08-14 DIAGNOSIS — M79676 Pain in unspecified toe(s): Secondary | ICD-10-CM

## 2016-08-14 NOTE — Progress Notes (Signed)
Patient ID: Marvin L Starkes Sr., male   DOB: 12/02/1919, 82 y.o.   MRN: [id number] HPI  Complaint:  Visit Type: Patient returns to my office for continued preventative foot care services. Complaint: Patient states" my nails have grown long and thick and become painful to walk and wear shoes" Patient has been diagnosed with DM . This patient  presents for preventative foot care services. No changes to ROS  Podiatric Exam: Vascular: dorsalis pedis and posterior tibial pulses are non-palpable. Capillary return is diminished. Cold feet noted.. Skin turgor WNL, bilateral swelling  Sensorium: Diminished  Semmes Weinstein monofilament test. Normal tactile sensation bilaterally.  Nail Exam: Pt has thick disfigured discolored nails with subungual debris noted bilateral entire nail hallux through fifth toenails Ulcer Exam: There is no evidence of ulcer or pre-ulcerative changes or infection. Orthopedic Exam: Muscle tone and strength are WNL. No limitations in general ROM. No crepitus or effusions noted. Foot type and digits show no abnormalities. Bony prominences are unremarkable. Skin: No Porokeratosis. No infection or ulcers  Diagnosis:  Onychomycosis, Pain in right toe, pain in left toes  Treatment & Plan Procedures and Treatment: Consent by patient was obtained for treatment procedures. The patient understood the discussion of treatment and procedures well. All questions were answered thoroughly reviewed. Debridement of mycotic and hypertrophic toenails, 1 through 5 bilateral and clearing of subungual debris. No ulceration, no infection noted.  Return Visit-Office Procedure: Patient instructed to return to the office for a follow up visit 3 months for continued evaluation and treatment.    Ade Stmarie DPM  

## 2016-08-15 LAB — CHROMOSOME ANALYSIS, BONE MARROW

## 2016-08-23 ENCOUNTER — Encounter (HOSPITAL_COMMUNITY): Payer: Self-pay

## 2016-08-24 ENCOUNTER — Telehealth: Payer: Self-pay | Admitting: Neurology

## 2016-08-24 NOTE — Telephone Encounter (Signed)
[facility] [phone] called said the pt has enrolled in the program. She is wanting to know how advanced is his dementia.

## 2016-08-24 NOTE — Telephone Encounter (Signed)
Please call, last office visit was in June 2017, Mini-Mental Status Examination 20/30, he still able to lives by himself, consistent with moderate dementia.

## 2016-08-27 NOTE — Telephone Encounter (Signed)
Returned call to Levada Dy to update patient's health status.

## 2016-11-08 ENCOUNTER — Encounter (HOSPITAL_COMMUNITY): Payer: Self-pay

## 2016-11-08 ENCOUNTER — Emergency Department (HOSPITAL_COMMUNITY): Payer: Medicare Other

## 2016-11-08 ENCOUNTER — Observation Stay (HOSPITAL_COMMUNITY)
Admission: EM | Admit: 2016-11-08 | Discharge: 2016-11-09 | Disposition: A | Discharge status: Home / Self Care | Payer: Medicare Other | Attending: Family Medicine | Admitting: Family Medicine

## 2016-11-08 DIAGNOSIS — F0390 Unspecified dementia without behavioral disturbance: Secondary | ICD-10-CM | POA: Insufficient documentation

## 2016-11-08 DIAGNOSIS — Z8546 Personal history of malignant neoplasm of prostate: Secondary | ICD-10-CM | POA: Insufficient documentation

## 2016-11-08 DIAGNOSIS — Z794 Long term (current) use of insulin: Secondary | ICD-10-CM | POA: Diagnosis not present

## 2016-11-08 DIAGNOSIS — J111 Influenza due to unidentified influenza virus with other respiratory manifestations: Secondary | ICD-10-CM | POA: Diagnosis not present

## 2016-11-08 DIAGNOSIS — K219 Gastro-esophageal reflux disease without esophagitis: Secondary | ICD-10-CM | POA: Insufficient documentation

## 2016-11-08 DIAGNOSIS — E7800 Pure hypercholesterolemia, unspecified: Secondary | ICD-10-CM | POA: Diagnosis not present

## 2016-11-08 DIAGNOSIS — Z79899 Other long term (current) drug therapy: Secondary | ICD-10-CM | POA: Insufficient documentation

## 2016-11-08 DIAGNOSIS — IMO0001 Reserved for inherently not codable concepts without codable children: Secondary | ICD-10-CM

## 2016-11-08 DIAGNOSIS — R000 Tachycardia, unspecified: Secondary | ICD-10-CM | POA: Insufficient documentation

## 2016-11-08 DIAGNOSIS — E1142 Type 2 diabetes mellitus with diabetic polyneuropathy: Secondary | ICD-10-CM | POA: Diagnosis not present

## 2016-11-08 DIAGNOSIS — I5042 Chronic combined systolic (congestive) and diastolic (congestive) heart failure: Secondary | ICD-10-CM | POA: Diagnosis not present

## 2016-11-08 DIAGNOSIS — Z7901 Long term (current) use of anticoagulants: Secondary | ICD-10-CM | POA: Diagnosis not present

## 2016-11-08 DIAGNOSIS — Z8639 Personal history of other endocrine, nutritional and metabolic disease: Secondary | ICD-10-CM

## 2016-11-08 DIAGNOSIS — E119 Type 2 diabetes mellitus without complications: Secondary | ICD-10-CM | POA: Diagnosis not present

## 2016-11-08 DIAGNOSIS — I110 Hypertensive heart disease with heart failure: Secondary | ICD-10-CM | POA: Insufficient documentation

## 2016-11-08 DIAGNOSIS — Z86711 Personal history of pulmonary embolism: Secondary | ICD-10-CM | POA: Insufficient documentation

## 2016-11-08 DIAGNOSIS — J189 Pneumonia, unspecified organism: Secondary | ICD-10-CM | POA: Diagnosis present

## 2016-11-08 LAB — COOXEMETRY PANEL
Carboxyhemoglobin: 0.7 % (ref 0.5–1.5)
Methemoglobin: 0.9 % (ref 0.0–1.5)
O2 SAT: 51.3 %
Total hemoglobin: 12.5 g/dL (ref 12.0–16.0)

## 2016-11-08 LAB — I-STAT CG4 LACTIC ACID, ED: Lactic Acid, Venous: 1.1 mmol/L (ref 0.5–1.9)

## 2016-11-08 LAB — CBC WITH DIFFERENTIAL/PLATELET
Basophils Absolute: 0 10*3/uL (ref 0.0–0.1)
Basophils Relative: 0 %
EOS ABS: 0.2 10*3/uL (ref 0.0–0.7)
EOS PCT: 3 %
HCT: 39.8 % (ref 39.0–52.0)
HEMOGLOBIN: 13.3 g/dL (ref 13.0–17.0)
LYMPHS ABS: 1 10*3/uL (ref 0.7–4.0)
Lymphocytes Relative: 11 %
MCH: 27.4 pg (ref 26.0–34.0)
MCHC: 33.4 g/dL (ref 30.0–36.0)
MCV: 82.1 fL (ref 78.0–100.0)
Monocytes Absolute: 1.2 10*3/uL — ABNORMAL HIGH (ref 0.1–1.0)
Monocytes Relative: 14 %
NEUTROS PCT: 72 %
Neutro Abs: 6.4 10*3/uL (ref 1.7–7.7)
Platelets: 218 10*3/uL (ref 150–400)
RBC: 4.85 MIL/uL (ref 4.22–5.81)
RDW: 15.1 % (ref 11.5–15.5)
WBC: 8.9 10*3/uL (ref 4.0–10.5)

## 2016-11-08 LAB — BASIC METABOLIC PANEL
Anion gap: 6 (ref 5–15)
BUN: 24 mg/dL — ABNORMAL HIGH (ref 6–20)
CALCIUM: 8.9 mg/dL (ref 8.9–10.3)
CHLORIDE: 107 mmol/L (ref 101–111)
CO2: 28 mmol/L (ref 22–32)
CREATININE: 1.63 mg/dL — AB (ref 0.61–1.24)
GFR calc Af Amer: 40 mL/min — ABNORMAL LOW (ref >60)
GFR calc non Af Amer: 34 mL/min — ABNORMAL LOW (ref >60)
Glucose, Bld: 66 mg/dL (ref 65–99)
Potassium: 4.2 mmol/L (ref 3.5–5.1)
Sodium: 141 mmol/L (ref 135–145)

## 2016-11-08 LAB — TROPONIN I

## 2016-11-08 LAB — GLUCOSE, CAPILLARY
GLUCOSE-CAPILLARY: 70 mg/dL (ref 65–99)
Glucose-Capillary: 63 mg/dL — ABNORMAL LOW (ref 65–99)

## 2016-11-08 LAB — PROTIME-INR
INR: 2.87
Prothrombin Time: 30.7 seconds — ABNORMAL HIGH (ref 11.4–15.2)

## 2016-11-08 LAB — INFLUENZA PANEL BY PCR (TYPE A & B)
Influenza A By PCR: POSITIVE — AB
Influenza B By PCR: NEGATIVE

## 2016-11-08 LAB — BRAIN NATRIURETIC PEPTIDE: B NATRIURETIC PEPTIDE 5: 28.4 pg/mL (ref 0.0–100.0)

## 2016-11-08 MED ORDER — IPRATROPIUM-ALBUTEROL 0.5-2.5 (3) MG/3ML IN SOLN
3.0000 mL | Freq: Once | RESPIRATORY_TRACT | Status: AC
  Administered 2016-11-08: 3 mL via RESPIRATORY_TRACT
  Filled 2016-11-08: qty 3

## 2016-11-08 MED ORDER — PANTOPRAZOLE SODIUM 40 MG PO TBEC
80.0000 mg | DELAYED_RELEASE_TABLET | Freq: Every day | ORAL | Status: DC
  Administered 2016-11-08 – 2016-11-09 (×2): 80 mg via ORAL
  Filled 2016-11-08 (×2): qty 2

## 2016-11-08 MED ORDER — INSULIN DETEMIR 100 UNIT/ML ~~LOC~~ SOLN
10.0000 [IU] | Freq: Two times a day (BID) | SUBCUTANEOUS | Status: DC
  Administered 2016-11-09: 10 [IU] via SUBCUTANEOUS
  Filled 2016-11-08 (×3): qty 0.1

## 2016-11-08 MED ORDER — GUAIFENESIN-DM 100-10 MG/5ML PO SYRP
5.0000 mL | ORAL_SOLUTION | ORAL | Status: DC | PRN
  Administered 2016-11-08 – 2016-11-09 (×2): 5 mL via ORAL
  Filled 2016-11-08 (×2): qty 10

## 2016-11-08 MED ORDER — OSELTAMIVIR PHOSPHATE 30 MG PO CAPS
30.0000 mg | ORAL_CAPSULE | Freq: Every day | ORAL | Status: DC
  Administered 2016-11-08: 30 mg via ORAL
  Filled 2016-11-08: qty 1

## 2016-11-08 MED ORDER — MEMANTINE HCL 10 MG PO TABS
10.0000 mg | ORAL_TABLET | Freq: Two times a day (BID) | ORAL | Status: DC
  Administered 2016-11-08 – 2016-11-09 (×2): 10 mg via ORAL
  Filled 2016-11-08 (×2): qty 1

## 2016-11-08 MED ORDER — ALBUTEROL SULFATE (2.5 MG/3ML) 0.083% IN NEBU: 2.5000 mg | INHALATION_SOLUTION | RESPIRATORY_TRACT | Status: DC | PRN

## 2016-11-08 MED ORDER — POLYETHYLENE GLYCOL 3350 17 G PO PACK: 17.0000 g | PACK | Freq: Every day | ORAL | Status: DC | PRN

## 2016-11-08 MED ORDER — WARFARIN - PHYSICIAN DOSING INPATIENT: Freq: Every day | Status: DC

## 2016-11-08 MED ORDER — DEXTROSE 5 % IV SOLN
1.0000 g | Freq: Once | INTRAVENOUS | Status: AC
  Administered 2016-11-08: 1 g via INTRAVENOUS
  Filled 2016-11-08: qty 10

## 2016-11-08 MED ORDER — SIMVASTATIN 40 MG PO TABS
40.0000 mg | ORAL_TABLET | Freq: Every morning | ORAL | Status: DC
  Administered 2016-11-09: 40 mg via ORAL
  Filled 2016-11-08: qty 1

## 2016-11-08 MED ORDER — DM-GUAIFENESIN ER 30-600 MG PO TB12
1.0000 | ORAL_TABLET | Freq: Two times a day (BID) | ORAL | Status: DC
  Administered 2016-11-08 – 2016-11-09 (×2): 1 via ORAL
  Filled 2016-11-08 (×3): qty 1

## 2016-11-08 MED ORDER — OSELTAMIVIR PHOSPHATE 30 MG PO CAPS
30.0000 mg | ORAL_CAPSULE | Freq: Two times a day (BID) | ORAL | Status: DC
  Administered 2016-11-08 – 2016-11-09 (×2): 30 mg via ORAL
  Filled 2016-11-08 (×2): qty 1

## 2016-11-08 MED ORDER — SODIUM CHLORIDE 0.9 % IV BOLUS (SEPSIS)
500.0000 mL | Freq: Once | INTRAVENOUS | Status: AC
  Administered 2016-11-08: 500 mL via INTRAVENOUS

## 2016-11-08 MED ORDER — AZITHROMYCIN 250 MG PO TABS
500.0000 mg | ORAL_TABLET | Freq: Once | ORAL | Status: AC
  Administered 2016-11-08: 500 mg via ORAL
  Filled 2016-11-08: qty 2

## 2016-11-08 MED ORDER — WARFARIN SODIUM 5 MG PO TABS
7.5000 mg | ORAL_TABLET | Freq: Every day | ORAL | Status: DC
  Administered 2016-11-08: 7.5 mg via ORAL
  Filled 2016-11-08: qty 1

## 2016-11-08 MED ORDER — ACETAMINOPHEN 325 MG PO TABS
650.0000 mg | ORAL_TABLET | Freq: Four times a day (QID) | ORAL | Status: DC | PRN
  Administered 2016-11-08 (×2): 650 mg via ORAL
  Filled 2016-11-08 (×2): qty 2

## 2016-11-08 MED ORDER — GABAPENTIN 100 MG PO CAPS
200.0000 mg | ORAL_CAPSULE | Freq: Every day | ORAL | Status: DC
  Administered 2016-11-08: 200 mg via ORAL
  Filled 2016-11-08: qty 2

## 2016-11-08 MED ORDER — INSULIN ASPART 100 UNIT/ML ~~LOC~~ SOLN
0.0000 [IU] | Freq: Three times a day (TID) | SUBCUTANEOUS | Status: DC
  Administered 2016-11-09: 1 [IU] via SUBCUTANEOUS
  Administered 2016-11-09: 3 [IU] via SUBCUTANEOUS

## 2016-11-08 MED ORDER — LORATADINE 10 MG PO TABS
10.0000 mg | ORAL_TABLET | Freq: Every day | ORAL | Status: DC
  Administered 2016-11-08 – 2016-11-09 (×2): 10 mg via ORAL
  Filled 2016-11-08 (×2): qty 1

## 2016-11-08 MED ORDER — METOPROLOL TARTRATE 25 MG PO TABS
12.5000 mg | ORAL_TABLET | Freq: Two times a day (BID) | ORAL | Status: DC
  Administered 2016-11-08 – 2016-11-09 (×2): 12.5 mg via ORAL
  Filled 2016-11-08 (×2): qty 1

## 2016-11-08 MED ORDER — ACETAMINOPHEN 325 MG PO TABS
650.0000 mg | ORAL_TABLET | Freq: Once | ORAL | Status: AC
  Administered 2016-11-08: 650 mg via ORAL
  Filled 2016-11-08: qty 2

## 2016-11-08 NOTE — ED Notes (Signed)
Pt reports left sided cp w/o radiation. EKG within normal limits and shown to MD.

## 2016-11-08 NOTE — Progress Notes (Signed)
Hypoglycemic Event  CBG: 63  Treatment: 15 GM carbohydrate snack  Symptoms: None  Follow-up CBG: Time: CBG Result:  Possible Reasons for Event: Inadequate meal intake  Comments/MD notified:Grunz     Kizzie Ide

## 2016-11-08 NOTE — ED Triage Notes (Signed)
Pt having SOB and mild lower extremity edema Hx CHF and Non-productive cough. Pt recent has heating issues at his home and just had it furnace repaired and has had slight cough since then.

## 2016-11-08 NOTE — Progress Notes (Signed)
PHARMACIST - PHYSICIAN COMMUNICATION DR:  Bonner Puna CONCERNING: Pharmacy Care Issues Regarding Warfarin Labs  RECOMMENDATION (Action Taken): A baseline and daily protime for three days has been ordered to meet the [facility] Patient safety goal and comply with the current [place].   The Pharmacy will defer all warfarin dose order changes and follow up of lab results to the prescriber unless an additional order to initiate a "pharmacy Coumadin consult" is placed.  DESCRIPTION:  While hospitalized, to be in compliance with [facility] Patient Safety Goals, all patients on warfarin must have a baseline and/or current protime prior to the administration of warfarin. Pharmacy has received your order for warfarin without these required laboratory assessments.   Gretta Arab PharmD, BCPS Pager [phone] 11/08/2016 4:25 PM

## 2016-11-08 NOTE — Progress Notes (Addendum)
PHARMACY NOTE:  ANTIMICROBIAL RENAL DOSAGE ADJUSTMENT  Current antimicrobial regimen includes a mismatch between antimicrobial dosage and estimated renal function.  As per policy approved by the Pharmacy & Therapeutics and Medical Executive Committees, the antimicrobial dosage will be adjusted accordingly.  Current antimicrobial dosage:  Tamiflu 30mg  PO once daily  Indication: Influenza A positive  Renal Function:  Estimated Creatinine Clearance: 34.8 mL/min (by C-G formula based on SCr of 1.63 mg/dL (H)). []      On intermittent HD, scheduled: []      On CRRT    Antimicrobial dosage has been changed to:  Tamiflu 30 mg PO BID for 5 days  Thank you for allowing pharmacy to be a part of this patient's care.  Gretta Arab PharmD, BCPS Pager [phone] 11/08/2016 4:18 PM

## 2016-11-08 NOTE — H&P (Signed)
History and Physical   Marvin Bishop [id number] DOB: [birth year] DOA: 11/08/2016  Referring MD/NP/PA: Dr. Ralene Bathe, EDP PCP: [name], MD  Patient coming from: Home  Chief Complaint: dyspnea, cough  HPI: Marvin Barns Sr. is a 83 y.o. male with a history of CHF, PE, IDDM, and dementia who presented to [facility] for shortness of breath and persistent cough.   History provided by patient assisted by his daughter due to patient's baseline dementia. They report increasing nonproductive cough with associated shortness of breath and left-sided chest pain that began abruptly early yesterday morning and has continued constantly, worsening, now severe since that time. They report subjective fever. Of note, the patient's oil furnace went out just prior to symptoms and was fixed yesterday afternoon.  ED Course: On arrival he appeared ill, with persistent coughing, wheezing improved with breathing treatment, and trace bilateral LE edema.   Review of Systems: Family reports bilateral leg swelling slightly more than usual during this time. No vision changes, HA, neck stiffness, abd pain, N/V/D, dysuria, and per HPI. All others reviewed and are negative.   Past Medical History:  Diagnosis Date  . Arthritis   . Asthma    hx of as child  . Complete loss of vision    right eye  . Dementia   . Diabetes mellitus   . Headache(784.0)    slight  . Hypercholesteremia   . Hypertension   . Insomnia    occasional  . Memory loss   . Nephrolithiasis    right UVJ stone  . Peripheral neuropathy (HCC)    both legs  . PONV (postoperative nausea and vomiting)   . Prostate cancer [facility])     Past Surgical History:  Procedure Laterality Date  . APPENDECTOMY    . Cornea removed Right 02-26-15  . CYSTOSCOPY W/ URETERAL STENT PLACEMENT Right 10/01/2013   Procedure: cystoscopy right retrograde holmium laser lithotripsy;  Surgeon: Franchot Gallo, MD;  Location: [facility];  Service: Urology;  Laterality:  Right;  holmium laser application  . HEMORROIDECTOMY    . KNEE ARTHROSCOPY Right   . PARS PLANA VITRECTOMY  10/24/2011   Procedure: PARS PLANA VITRECTOMY WITH 23 GAUGE;  Surgeon: Adonis Brook, MD;  Location: [place];  Service: Ophthalmology;  Laterality: Right;  . TONSILLECTOMY  as teen    - Former light smoker 60 years ago, has 24 hr supervision at home. Doesn't drink or use illicit drugs. Daughter lives nearby and helps several days per week.   Allergies  Allergen Reactions  . Donepezil Nausea And Vomiting  . Hydrocodone Nausea And Vomiting    Family History  Problem Relation Age of Onset  . Cancer Mother     uterus  . Heart Problems Father   . Cancer Brother     stomach  . Cancer Brother     throat  . Colon cancer Brother    - Family history otherwise reviewed and not pertinent.  Prior to Admission medications   Medication Sig Start Date End Date Taking? Authorizing Provider  Carboxymethylcellul-Glycerin 0.5-0.9 % SOLN Apply 1 drop to eye daily as needed (dry eye).   Yes Historical Provider, MD  docusate sodium 100 MG CAPS Take 200 mg by mouth 2 (two) times daily as needed for mild constipation. Patient taking differently: Take 200 mg by mouth daily.  08/25/14  Yes Thurnell Lose, MD  esomeprazole (NEXIUM) 40 MG capsule Take 1 capsule (40 mg total) by mouth 2 (two) times daily before a meal.  04/04/15  Yes Barton Dubois, MD  famotidine (PEPCID) 20 MG tablet Take 1 tablet (20 mg total) by mouth at bedtime. 04/04/15  Yes Barton Dubois, MD  furosemide (LASIX) 40 MG tablet Take 20-40 mg by mouth daily. takes 40 mg on MWFSun and 20mg  on TThSat   Yes Historical Provider, MD  gabapentin (NEURONTIN) 100 MG capsule TAKE 2 CAPSULES BY MOUTH EVERY DAY AT BEDTIME 09/21/15  Yes Historical Provider, MD  glipiZIDE (GLUCOTROL XL) 10 MG 24 hr tablet Take 10 mg by mouth 2 (two) times daily.   Yes Historical Provider, MD  HUMALOG KWIKPEN 100 UNIT/ML KiwkPen Inject 2-7 Units into the skin 2 (two)  times daily with a meal. 2 units if sugar level 150-200 and 7 units when above 200 06/08/16  Yes Historical Provider, MD  insulin detemir (LEVEMIR) 100 UNIT/ML injection Inject 0.1 mLs (10 Units total) into the skin daily at 10 pm. Patient taking differently: Inject 20 Units into the skin 2 (two) times daily.  04/10/15  Yes Silver Huguenin Elgergawy, MD  Loratadine (CLARITIN) 10 MG CAPS Take 1 capsule by mouth daily.   Yes Historical Provider, MD  memantine (NAMENDA) 10 MG tablet Take 1 tablet (10 mg total) by mouth 2 (two) times daily. 03/29/16  Yes Marcial Pacas, MD  metoprolol tartrate (LOPRESSOR) 25 MG tablet Take 12.5 mg by mouth 2 (two) times daily.  06/23/15  Yes Historical Provider, MD  Multiple Vitamins-Minerals (ELDERTONIC PO) Take 15 mLs by mouth 2 (two) times daily.   Yes Historical Provider, MD  polyethylene glycol (MIRALAX / GLYCOLAX) packet Take 17 g by mouth daily as needed for mild constipation. Takes on [facility] 04/29/15  Yes Historical Provider, MD  simvastatin (ZOCOR) 40 MG tablet Take 40 mg by mouth every morning.    Yes Historical Provider, MD  warfarin (COUMADIN) 5 MG tablet Take 7.5 mg by mouth at bedtime.  11/16/15  Yes Historical Provider, MD  BD PEN NEEDLE NANO U/F 32G X 4 MM MISC See admin instructions. 07/13/15   Historical Provider, MD  bisacodyl (DULCOLAX) 10 MG suppository Place 1 suppository (10 mg total) rectally daily as needed for moderate constipation. Patient not taking: Reported on 11/08/2016 04/10/15   [name], MD    Physical Exam: Vitals:   11/08/16 1026 11/08/16 1030 11/08/16 1041 11/08/16 1304  BP:  113/68 132/66 102/63  Pulse:  113 113 106  Resp:  16 15 24   Temp: 100.8 F (38.2 C)     TempSrc: Rectal     SpO2:  100% 99% 99%   Constitutional: 83 y.o. male in no distress, calm demeanor Eyes: Right enucleated, left PERRL, normal conjunctiva ENMT: Mucous membranes are moist. Posterior pharynx clear of any exudate or lesions. Dentures in situ.  Neck: normal, supple,  no masses, no thyromegaly Respiratory: Increased rate and mildly labored breathing room air with diffuse end-expiratory rhonchi/wheezing good air movement Cardiovascular: Regular rate and rhythm, no murmurs, rubs, or gallops. No carotid bruits. No JVD. trace LE edema. 1+ pedal pulses. Abdomen: Normoactive bowel sounds. No tenderness, non-distended, and no masses palpated. No hepatosplenomegaly. GU: No indwelling catheter Musculoskeletal: No clubbing / cyanosis. No joint deformity upper and lower extremities. Good ROM, no contractures. Normal muscle tone.  Skin: Warm, dry. No rashes, wounds, or ulcers. Has irregular annular hyperpigmented keratotic papule on left abdomen without significant erythema/tenderness and no drainage. Neurologic: CN II-XII grossly intact. Gait not assessed. Speech normal. No focal deficits in motor strength or sensation in all extremities.  Psychiatric: Alert and oriented only to person.   Labs on Admission: I have personally reviewed following labs and imaging studies  CBC:  Recent Labs Lab 11/08/16 0630  WBC 8.9  NEUTROABS 6.4  HGB 13.3  HCT 39.8  MCV 82.1  PLT 99991111   Basic Metabolic Panel:  Recent Labs Lab 11/08/16 0630  NA 141  K 4.2  CL 107  CO2 28  GLUCOSE 66  BUN 24*  CREATININE 1.63*  CALCIUM 8.9   GFR: CrCl cannot be calculated (Unknown ideal weight.). Liver Function Tests: No results for input(s): AST, ALT, ALKPHOS, BILITOT, PROT, ALBUMIN in the last 168 hours. No results for input(s): LIPASE, AMYLASE in the last 168 hours. No results for input(s): AMMONIA in the last 168 hours. Coagulation Profile:  Recent Labs Lab 11/08/16 0630  INR 2.87   Cardiac Enzymes:  Recent Labs Lab 11/08/16 0630  TROPONINI <0.03   BNP (last 3 results) No results for input(s): PROBNP in the last 8760 hours. HbA1C: No results for input(s): HGBA1C in the last 72 hours. CBG: No results for input(s): GLUCAP in the last 168 hours. Lipid Profile: No  results for input(s): CHOL, HDL, LDLCALC, TRIG, CHOLHDL, LDLDIRECT in the last 72 hours. Thyroid Function Tests: No results for input(s): TSH, T4TOTAL, FREET4, T3FREE, THYROIDAB in the last 72 hours. Anemia Panel: No results for input(s): VITAMINB12, FOLATE, FERRITIN, TIBC, IRON, RETICCTPCT in the last 72 hours. Urine analysis:    Component Value Date/Time   COLORURINE YELLOW 06/24/2015 1940   APPEARANCEUR CLEAR 06/24/2015 1940   LABSPEC 1.021 06/24/2015 1940   PHURINE 5.5 06/24/2015 1940   GLUCOSEU NEGATIVE 06/24/2015 1940   HGBUR NEGATIVE 06/24/2015 1940   BILIRUBINUR NEGATIVE 06/24/2015 1940   KETONESUR NEGATIVE 06/24/2015 1940   PROTEINUR NEGATIVE 06/24/2015 1940   UROBILINOGEN 1.0 06/24/2015 1940   NITRITE NEGATIVE 06/24/2015 1940   LEUKOCYTESUR NEGATIVE 06/24/2015 1940   Sepsis Labs: @LABRCNTIP (procalcitonin:4,lacticidven:4) ) Recent Results (from the past 240 hour(s))  Culture, blood (routine x 2)     Status: None (Preliminary result)   Collection Time: 11/08/16 10:40 AM  Result Value Ref Range Status   Specimen Description BLOOD LEFT ANTECUBITAL  Final   Special Requests BOTTLES DRAWN AEROBIC AND ANAEROBIC 5 CC EA  Final   Culture PENDING  Incomplete   Report Status PENDING  Incomplete     Radiological Exams on Admission: Dg Chest 2 View  Result Date: 11/08/2016 CLINICAL DATA:  Worsening cough and shortness of breath over the last few days, increasing weakness EXAM: CHEST  2 VIEW COMPARISON:  CT chest of 06/24/2015 and chest x-ray of the same date FINDINGS: No pneumonia is seen. No definite pleural effusion is noted. Minimally prominent interstitial markings remain particular at the lung bases. Mediastinal and hilar contours are unremarkable. The heart is within normal limits in size. There are degenerative changes throughout the thoracic spine. IMPRESSION: No definite pneumonia or effusion. Minimally prominent interstitial markings remain at the lung bases right greater  than left. Electronically Signed   By: Ivar Drape M.D.   On: 11/08/2016 08:05    EKG: Independently reviewed. NSR with no ischemic changes, very similar to prior from 2016.   Assessment/Plan Principal Problem:   CAP (community acquired pneumonia) Active Problems:   IDDM (insulin dependent diabetes mellitus) ([place])   Dementia without behavioral disturbance   Acid reflux   Sinus tachycardia   Chronic combined systolic and diastolic congestive heart failure (HCC)   History of pulmonary embolus (PE)  Influenza: Lactate 1.1. Treated CAP empirically before flu PCR returned positive. Has chest pain with cough but not otherwise, troponin neg.  - Monitor leukocyte count - Tamiflu x5 days - Droplet precautions.  - DC azithromycin, ceftriaxone - Supplemental oxygen as needed for hypoxemia; goal SpO2: >90% - Albuterol q3h prn as breathing treatment helped in ED. - At high risk for rapid deconditioning, formerly getting around with a cane, will get PT eval.  History of PE: - Continue stable coumadin dose  Chronic combined systolic and diastolic CHF: Last echo [id number] w/EF 45-50%, G1DD, diffuse hypokinesis. BNP 28.4. - Hold lasix for now (takes 40mg /20mg  alternating) - Restart metoprolol 12.5mg  BID - Continue statin  IDDM: No recent HbA1c. Takes levemir 20mg  BID, and humalog mealtime SSI 2-7 units.  - Cut levemir dose to 10mg  BID, eating well. - Sensitive SSI - Hold [facility], would consider stopping glipizide since on insulin  Dementia:  - Namenda  GERD: Chronic, stable - Continue PPI  DVT prophylaxis: Coumadin  Code Status: Full code confirmed at admission  Family Communication: Daughter, HCPOA at bedside Disposition Plan: [place] in next 24 hours to home if stable Consults called: None  Admission status: Observation. Due to patient's age and ill appearance, decompensation is thought to be possible, therefore observation is warranted.   Vance Gather, MD [facility] Pager  [phone]  If 7PM-7AM, please contact night-coverage [email or web address] Password [email or web address] 11/08/2016, 1:31 PM

## 2016-11-08 NOTE — ED Provider Notes (Signed)
[facility] DEPT Provider Note   CSN: [id number] Arrival date & time: 11/08/16  0630     History   Chief Complaint Chief Complaint  Patient presents with  . Shortness of Breath    HPI Marvin Ewell. is a 83 y.o. male.  The history is provided by the patient and a relative. No language interpreter was used.  Shortness of Breath    Marvin Bishop. is a 83 y.o. male who presents to the Emergency Department complaining of cough/sob.  History is provided by the patient and his daughter. Yesterday early morning his furnace went out. Since that time he's had increased coughing and shortness of breath with left-sided chest pain when coughing. He has an oil furnace. After he woke up he sat in the kitchen to stay warm by the oven. The furnace was fixed by yesterday afternoon.  His cough is severe in nature and she brought him to be evaluated yesterday but when he left the house is coughing significantly improved and she brought him back home. Overnight he had significant worsening in his cough. She reports increased lower extremity edema since yesterday as well as a temp of 99 at home. No vomiting or diarrhea. The stove is electric. There is no carbon monoxide detector in the home. Past Medical History:  Diagnosis Date  . Arthritis   . Asthma    hx of as child  . Complete loss of vision    right eye  . Dementia   . Diabetes mellitus   . Headache(784.0)    slight  . Hypercholesteremia   . Hypertension   . Insomnia    occasional  . Memory loss   . Nephrolithiasis    right UVJ stone  . Peripheral neuropathy (HCC)    both legs  . PONV (postoperative nausea and vomiting)   . Prostate cancer [facility])     Patient Active Problem List   Diagnosis Date Noted  . Influenza 11/08/2016  . Chronic combined systolic and diastolic congestive heart failure ([place]) 11/08/2016  . History of pulmonary embolus (PE) 11/08/2016  . PE (pulmonary embolism) 04/07/2015  . Pulmonary embolism ([place])  04/07/2015  . Elevated troponin 04/07/2015  . Cough   . Pulmonary emboli ([place])   . Sinus tachycardia 04/01/2015  . Acid reflux 03/07/2015  . Blind painful eye 01/07/2015  . Ileus ([place]) 08/23/2014  . Dementia without behavioral disturbance 09/02/2013  . Renal calculus or stone 08/22/2013  . Hypokalemia 08/22/2013  . Hypertension 08/22/2013  . IDDM (insulin dependent diabetes mellitus) ([place]) 08/22/2013  . Dehydration 08/22/2013  . Memory loss 04/23/2013  . Insomnia   . Carpal tunnel syndrome   . Diverticulosis     Past Surgical History:  Procedure Laterality Date  . APPENDECTOMY    . Cornea removed Right 02-26-15  . CYSTOSCOPY W/ URETERAL STENT PLACEMENT Right 10/01/2013   Procedure: cystoscopy right retrograde holmium laser lithotripsy;  Surgeon: Franchot Gallo, MD;  Location: [facility];  Service: Urology;  Laterality: Right;  holmium laser application  . HEMORROIDECTOMY    . KNEE ARTHROSCOPY Right   . PARS PLANA VITRECTOMY  10/24/2011   Procedure: PARS PLANA VITRECTOMY WITH 23 GAUGE;  Surgeon: Adonis Brook, MD;  Location: [facility];  Service: Ophthalmology;  Laterality: Right;  . TONSILLECTOMY  as teen       Home Medications    Prior to Admission medications   Medication Sig Start Date End Date Taking? Authorizing Provider  Carboxymethylcellul-Glycerin 0.5-0.9 % SOLN Apply  1 drop to eye daily as needed (dry eye).   Yes Historical Provider, MD  docusate sodium 100 MG CAPS Take 200 mg by mouth 2 (two) times daily as needed for mild constipation. Patient taking differently: Take 200 mg by mouth daily.  08/25/14  Yes Thurnell Lose, MD  esomeprazole (NEXIUM) 40 MG capsule Take 1 capsule (40 mg total) by mouth 2 (two) times daily before a meal. 04/04/15  Yes Barton Dubois, MD  famotidine (PEPCID) 20 MG tablet Take 1 tablet (20 mg total) by mouth at bedtime. 04/04/15  Yes Barton Dubois, MD  furosemide (LASIX) 40 MG tablet Take 20-40 mg by mouth daily. takes 40 mg on MWFSun and 20mg  on  TThSat   Yes Historical Provider, MD  gabapentin (NEURONTIN) 100 MG capsule TAKE 2 CAPSULES BY MOUTH EVERY DAY AT BEDTIME 09/21/15  Yes Historical Provider, MD  glipiZIDE (GLUCOTROL XL) 10 MG 24 hr tablet Take 10 mg by mouth 2 (two) times daily.   Yes Historical Provider, MD  HUMALOG KWIKPEN 100 UNIT/ML KiwkPen Inject 2-7 Units into the skin 2 (two) times daily with a meal. 2 units if sugar level 150-200 and 7 units when above 200 06/08/16  Yes Historical Provider, MD  insulin detemir (LEVEMIR) 100 UNIT/ML injection Inject 0.1 mLs (10 Units total) into the skin daily at 10 pm. Patient taking differently: Inject 20 Units into the skin 2 (two) times daily.  04/10/15  Yes Silver Huguenin Elgergawy, MD  Loratadine (CLARITIN) 10 MG CAPS Take 1 capsule by mouth daily.   Yes Historical Provider, MD  memantine (NAMENDA) 10 MG tablet Take 1 tablet (10 mg total) by mouth 2 (two) times daily. 03/29/16  Yes Marcial Pacas, MD  metoprolol tartrate (LOPRESSOR) 25 MG tablet Take 12.5 mg by mouth 2 (two) times daily.  06/23/15  Yes Historical Provider, MD  Multiple Vitamins-Minerals (ELDERTONIC PO) Take 15 mLs by mouth 2 (two) times daily.   Yes Historical Provider, MD  polyethylene glycol (MIRALAX / GLYCOLAX) packet Take 17 g by mouth daily as needed for mild constipation. Takes on [facility] 04/29/15  Yes Historical Provider, MD  simvastatin (ZOCOR) 40 MG tablet Take 40 mg by mouth every morning.    Yes Historical Provider, MD  warfarin (COUMADIN) 5 MG tablet Take 7.5 mg by mouth at bedtime.  11/16/15  Yes Historical Provider, MD  BD PEN NEEDLE NANO U/F 32G X 4 MM MISC See admin instructions. 07/13/15   Historical Provider, MD  bisacodyl (DULCOLAX) 10 MG suppository Place 1 suppository (10 mg total) rectally daily as needed for moderate constipation. Patient not taking: Reported on 11/08/2016 04/10/15   [name], MD    Family History Family History  Problem Relation Age of Onset  . Cancer Mother     uterus  . Heart Problems  Father   . Cancer Brother     stomach  . Cancer Brother     throat  . Colon cancer Brother     Social History Social History  Substance Use Topics  . Smoking status: Former Smoker    Packs/day: 0.25    Types: Cigarettes    Quit date: 10/15/1966  . Smokeless tobacco: Never Used  . Alcohol use 0.0 oz/week     Comment: occasional     Allergies   Donepezil and Hydrocodone   Review of Systems Review of Systems  Respiratory: Positive for shortness of breath.   All other systems reviewed and are negative.    Physical Exam Updated  Vital Signs BP 138/70 (BP Location: Right Arm)   Pulse 98   Temp 98.5 F (36.9 C) (Oral)   Resp 20   Wt 225 lb 12 oz (102.4 kg)   SpO2 98%   BMI 29.78 kg/m   Physical Exam  Constitutional: He is oriented to person, place, and time. He appears well-developed and well-nourished. No distress.  Frequent coughing on exam  HENT:  Head: Normocephalic and atraumatic.  Cardiovascular: Normal rate and regular rhythm.   No murmur heard. Pulmonary/Chest: Effort normal. No respiratory distress.  Good air movement bilaterally. End expiratory wheezes in right lung base.  Abdominal: Soft. There is no tenderness. There is no rebound and no guarding.  Musculoskeletal: He exhibits no tenderness.  Trace edema to bilateral lower extremities  Neurological: He is alert and oriented to person, place, and time.  Skin: Skin is warm and dry.  Psychiatric: He has a normal mood and affect. His behavior is normal.  Nursing note and vitals reviewed.    ED Treatments / Results  Labs (all labs ordered are listed, but only abnormal results are displayed) Labs Reviewed  BASIC METABOLIC PANEL - Abnormal; Notable for the following:       Result Value   BUN 24 (*)    Creatinine, Ser 1.63 (*)    GFR calc non Af Amer 34 (*)    GFR calc Af Amer 40 (*)    All other components within normal limits  CBC WITH DIFFERENTIAL/PLATELET - Abnormal; Notable for the following:      Monocytes Absolute 1.2 (*)    All other components within normal limits  PROTIME-INR - Abnormal; Notable for the following:    Prothrombin Time 30.7 (*)    All other components within normal limits  INFLUENZA PANEL BY PCR (TYPE A & B) - Abnormal; Notable for the following:    Influenza A By PCR POSITIVE (*)    All other components within normal limits  CULTURE, BLOOD (ROUTINE X 2)  CULTURE, BLOOD (ROUTINE X 2)  BRAIN NATRIURETIC PEPTIDE  TROPONIN I  COOXEMETRY PANEL  I-STAT CG4 LACTIC ACID, ED    EKG  EKG Interpretation  Date/Time:  Thursday November 08 2016 07:28:46 EST Ventricular Rate:  98 PR Interval:    QRS Duration: 100 QT Interval:  367 QTC Calculation: 469 R Axis:   31 Text Interpretation:  Sinus rhythm Abnormal R-wave progression, early transition Minimal ST elevation, anterior leads Confirmed by Hazle Coca [phone]) on 11/08/2016 8:35:55 AM       Radiology Dg Chest 2 View  Result Date: 11/08/2016 CLINICAL DATA:  Worsening cough and shortness of breath over the last few days, increasing weakness EXAM: CHEST  2 VIEW COMPARISON:  CT chest of 06/24/2015 and chest x-ray of the same date FINDINGS: No pneumonia is seen. No definite pleural effusion is noted. Minimally prominent interstitial markings remain particular at the lung bases. Mediastinal and hilar contours are unremarkable. The heart is within normal limits in size. There are degenerative changes throughout the thoracic spine. IMPRESSION: No definite pneumonia or effusion. Minimally prominent interstitial markings remain at the lung bases right greater than left. Electronically Signed   By: Ivar Drape M.D.   On: 11/08/2016 08:05    Procedures Procedures (including critical care time)  Medications Ordered in ED Medications  oseltamivir (TAMIFLU) capsule 30 mg (30 mg Oral Given 11/08/16 1452)  ipratropium-albuterol (DUONEB) 0.5-2.5 (3) MG/3ML nebulizer solution 3 mL (3 mLs Nebulization Given 11/08/16 0818)  sodium  chloride 0.9 %  bolus 500 mL (0 mLs Intravenous Stopped 11/08/16 1230)  acetaminophen (TYLENOL) tablet 650 mg (650 mg Oral Given 11/08/16 1047)  cefTRIAXone (ROCEPHIN) 1 g in dextrose 5 % 50 mL IVPB (0 g Intravenous Stopped 11/08/16 1117)  azithromycin (ZITHROMAX) tablet 500 mg (500 mg Oral Given 11/08/16 1048)     Initial Impression / Assessment and Plan / ED Course  I have reviewed the triage vital signs and the nursing notes.  Pertinent labs & imaging results that were available during my care of the patient were reviewed by me and considered in my medical decision making (see chart for details).     Pt here with cough, sob.  He has tachycardia, low grade fever.  Initial lung exam with focal wheezes in the right lung base, repeat assessment following albuterol treatments with no wheezing. Treated for possible pneumonia with antibiotics. Later during ED stay patient with positive influenza A. He was started on Tamiflu. Given his tachycardia, frequent coughing and advanced age plan to admit for observation.  Final Clinical Impressions(s) / ED Diagnoses   Final diagnoses:  None    New Prescriptions Current Discharge Medication List       Quintella Reichert, MD 11/08/16 1553

## 2016-11-09 DIAGNOSIS — Z794 Long term (current) use of insulin: Secondary | ICD-10-CM

## 2016-11-09 DIAGNOSIS — E119 Type 2 diabetes mellitus without complications: Secondary | ICD-10-CM

## 2016-11-09 DIAGNOSIS — J111 Influenza due to unidentified influenza virus with other respiratory manifestations: Secondary | ICD-10-CM

## 2016-11-09 DIAGNOSIS — I5042 Chronic combined systolic (congestive) and diastolic (congestive) heart failure: Secondary | ICD-10-CM | POA: Diagnosis not present

## 2016-11-09 DIAGNOSIS — Z86711 Personal history of pulmonary embolism: Secondary | ICD-10-CM | POA: Diagnosis not present

## 2016-11-09 DIAGNOSIS — R000 Tachycardia, unspecified: Secondary | ICD-10-CM

## 2016-11-09 LAB — BASIC METABOLIC PANEL
Anion gap: 6 (ref 5–15)
BUN: 22 mg/dL — AB (ref 6–20)
CALCIUM: 8.4 mg/dL — AB (ref 8.9–10.3)
CO2: 27 mmol/L (ref 22–32)
CREATININE: 1.56 mg/dL — AB (ref 0.61–1.24)
Chloride: 107 mmol/L (ref 101–111)
GFR calc Af Amer: 42 mL/min — ABNORMAL LOW (ref >60)
GFR, EST NON AFRICAN AMERICAN: 36 mL/min — AB (ref >60)
Glucose, Bld: 125 mg/dL — ABNORMAL HIGH (ref 65–99)
Potassium: 4 mmol/L (ref 3.5–5.1)
SODIUM: 140 mmol/L (ref 135–145)

## 2016-11-09 LAB — GLUCOSE, CAPILLARY
GLUCOSE-CAPILLARY: 121 mg/dL — AB (ref 65–99)
Glucose-Capillary: 241 mg/dL — ABNORMAL HIGH (ref 65–99)

## 2016-11-09 LAB — CBC
HCT: 35.5 % — ABNORMAL LOW (ref 39.0–52.0)
Hemoglobin: 11.7 g/dL — ABNORMAL LOW (ref 13.0–17.0)
MCH: 26.8 pg (ref 26.0–34.0)
MCHC: 33 g/dL (ref 30.0–36.0)
MCV: 81.4 fL (ref 78.0–100.0)
PLATELETS: 188 10*3/uL (ref 150–400)
RBC: 4.36 MIL/uL (ref 4.22–5.81)
RDW: 15.3 % (ref 11.5–15.5)
WBC: 8.7 10*3/uL (ref 4.0–10.5)

## 2016-11-09 LAB — PROTIME-INR
INR: 2.7
Prothrombin Time: 29.2 seconds — ABNORMAL HIGH (ref 11.4–15.2)

## 2016-11-09 MED ORDER — DM-GUAIFENESIN ER 30-600 MG PO TB12: 1.0000 | ORAL_TABLET | Freq: Two times a day (BID) | ORAL | 0 refills | Status: DC

## 2016-11-09 MED ORDER — ALBUTEROL SULFATE HFA 108 (90 BASE) MCG/ACT IN AERS: 2.0000 | INHALATION_SPRAY | Freq: Four times a day (QID) | RESPIRATORY_TRACT | 0 refills | Status: DC | PRN

## 2016-11-09 MED ORDER — OSELTAMIVIR PHOSPHATE 30 MG PO CAPS: 30.0000 mg | ORAL_CAPSULE | Freq: Two times a day (BID) | ORAL | 0 refills | Status: AC

## 2016-11-09 MED ORDER — GUAIFENESIN-DM 100-10 MG/5ML PO SYRP: 5.0000 mL | ORAL_SOLUTION | ORAL | 0 refills | Status: DC | PRN

## 2016-11-09 MED ORDER — PHENOL 1.4 % MT LIQD
1.0000 | OROMUCOSAL | Status: DC | PRN
  Administered 2016-11-09: 1 via OROMUCOSAL
  Filled 2016-11-09: qty 177

## 2016-11-09 NOTE — Evaluation (Signed)
Physical Therapy Evaluation Patient Details Name: Marvin HANSING Sr. MRN: [id number] DOB: [birth year] Today's Date: 11/09/2016   History of Present Illness  83 y.o. male with a history of CHF, PE, IDDM, and dementia and admitted with influenza  Clinical Impression  Pt admitted with above diagnosis. Pt currently with functional limitations due to the deficits listed below (see PT Problem List).  Pt will benefit from skilled PT to increase their independence and safety with mobility to allow discharge to the venue listed below.  Pt assisted with ambulating in hallway and very unsteady with SPC, fatigues quickly.  Daughter in room so recommended pt have close assist upon d/c for steadying, use RW for increased support, and take breaks as needed before pt becomes too fatigued.  Pt to likely d/c home today.  Daughter declines HHPT and states pt will have 24/7 assist upon d/c.     Follow Up Recommendations Home health PT (family member declined HHPT)    Equipment Recommendations  None recommended by PT    Recommendations for Other Services       Precautions / Restrictions Precautions Precautions: Fall Restrictions Weight Bearing Restrictions: No      Mobility  Bed Mobility Overal bed mobility: Needs Assistance Bed Mobility: Supine to Sit     Supine to sit: Supervision;HOB elevated        Transfers Overall transfer level: Needs assistance Equipment used: Straight cane Transfers: Sit to/from Stand Sit to Stand: Min guard         General transfer comment: increased time and effort, verbal cues for safe technique  Ambulation/Gait Ambulation/Gait assistance: Min assist Ambulation Distance (Feet): 120 Feet Assistive device: Straight cane Gait Pattern/deviations: Step-through pattern;Narrow base of support;Drifts right/left;Decreased stride length     General Gait Details: verbal cues for safety, assist to steady upon returning to room (last 60 feet) due to increased fatigue  and required bil UE support so provided HHA  Stairs            Wheelchair Mobility    Modified Rankin (Stroke Patients Only)       Balance Overall balance assessment:  (daughter denies falls)                                           Pertinent Vitals/Pain Pain Assessment: No/denies pain    Home Living Family/patient expects to be discharged to:: Private residence Living Arrangements: Children Available Help at Discharge: Available 24 hours/day;Family;Personal care attendant Type of Home: House Home Access: Stairs to enter     Home Layout: One level Home Equipment: Environmental consultant - 2 wheels;Cane - single point      Prior Function Level of Independence: Needs assistance   Gait / Transfers Assistance Needed: uses SPC for mobility           Hand Dominance        Extremity/Trunk Assessment        Lower Extremity Assessment Lower Extremity Assessment: Generalized weakness       Communication   Communication: No difficulties  Cognition Arousal/Alertness: Awake/alert Behavior During Therapy: WFL for tasks assessed/performed Overall Cognitive Status: History of cognitive impairments - at baseline                      General Comments      Exercises     Assessment/Plan    PT Assessment Patient needs  continued PT services  PT Problem List Decreased strength;Decreased activity tolerance;Decreased balance;Decreased mobility          PT Treatment Interventions DME instruction;Therapeutic activities;Therapeutic exercise;Gait training;Functional mobility training;Patient/family education;Stair training    PT Goals (Current goals can be found in the Care Plan section)  Acute Rehab PT Goals PT Goal Formulation: With family Time For Goal Achievement: 11/16/16 Potential to Achieve Goals: Good    Frequency Min 3X/week   Barriers to discharge        Co-evaluation               End of Session Equipment Utilized During  Treatment: Gait belt Activity Tolerance: Patient limited by fatigue Patient left: in chair;with call bell/phone within reach;with family/visitor present      Functional Assessment Tool Used: clinical judgement  Functional Limitation: Mobility: Walking and moving around Mobility: Walking and Moving Around Current Status [phone]): At least 20 percent but less than 40 percent impaired, limited or restricted Mobility: Walking and Moving Around Goal Status [phone]): At least 1 percent but less than 20 percent impaired, limited or restricted    Time: 0949-1002 PT Time Calculation (min) (ACUTE ONLY): 13 min   Charges:   PT Evaluation $PT Eval Low Complexity: 1 Procedure     PT G Codes:   PT G-Codes **NOT FOR INPATIENT CLASS** Functional Assessment Tool Used: clinical judgement  Functional Limitation: Mobility: Walking and moving around Mobility: Walking and Moving Around Current Status [id number]): At least 20 percent but less than 40 percent impaired, limited or restricted Mobility: Walking and Moving Around Goal Status [phone]): At least 1 percent but less than 20 percent impaired, limited or restricted    Derius Ghosh,KATHrine E 11/09/2016, 11:13 AM Carmelia Bake, PT, DPT 11/09/2016 Pager: [phone]

## 2016-11-09 NOTE — Care Management Note (Signed)
Case Management Note  Patient Details  Name: Marvin MOHNEY Sr. MRN: [id number] Date of Birth: Dec 31, 1919  Subjective/Objective: 83 y/o m admitted w/influenza. From home w/24hr caregivers, has dtr support,has rw. PT-recc HHPT-dtr declined since they have private caregivers.MD aware. No further CM needs.                   Action/Plan:d/c home.   Expected Discharge Date:  11/09/16               Expected Discharge Plan:  Home/Self Care  In-House Referral:     Discharge planning Services  CM Consult  Post Acute Care Choice:  Resumption of Svcs/PTA Provider (private caregivers) Choice offered to:  Adult Children  DME Arranged:    DME Agency:     HH Arranged:  Patient Refused [place] Agency:     Status of Service:  Completed, signed off  If discussed at [place] of Stay Meetings, dates discussed:    Additional Comments:  Dessa Phi, RN 11/09/2016, 2:51 PM

## 2016-11-09 NOTE — Care Management Obs Status (Signed)
[place] NOTIFICATION   Patient Details  Name: Marvin HECKMAN Sr. MRN: [id number] Date of Birth: 1920/08/29   Medicare Observation Status Notification Given:  Yes    Dessa Phi, RN 11/09/2016, 2:47 PM

## 2016-11-09 NOTE — Discharge Summary (Signed)
Physician Discharge Summary  Marvin REMINGTON Sr. [id number] DOB: June 24, 1920 DOA: 11/08/2016  PCP: [name], MD  Admit date: 11/08/2016 Discharge date: 11/09/2016  Admitted From: Home Disposition:  Home   Recommendations for Outpatient Follow-up:  1. Follow up with PCP in 1-2 weeks 2. Take medications as instructed 3. Please obtain BMP/CBC in one week 4. Wash hands thoroughly  Home Health: No Equipment/Devices: None   Discharge Condition:Stable CODE STATUS: Full code Diet recommendation: Heart Healthy / Carb Modified   Brief/Interim Summary: Marvin APA Sr. is a 83 y.o. male with a history of CHF, PE, IDDM, and dementia who presented to [facility] for shortness of breath and persistent cough.   History provided by patient assisted by his daughter due to patient's baseline dementia. They report increasing nonproductive cough with associated shortness of breath and left-sided chest pain that began abruptly early yesterday morning and has continued constantly, worsening, now severe since that time. They report subjective fever. Of note, the patient's oil furnace went out just prior to symptoms and was fixed yesterday afternoon.  ED Course: On arrival he appeared ill, with persistent coughing, wheezing improved with breathing treatment, and trace bilateral LE edema.  Patient's daughter is bedside and reports her father looks well.  Patient was off oxygen and stable for discharge.  She was given instructions on how to prevent spread of flu as well as warning signs for pneumonia (per her request).  Discharge Diagnoses:  Principal Problem:   Influenza Active Problems:   IDDM (insulin dependent diabetes mellitus) (HCC)   Dementia without behavioral disturbance   Acid reflux   Sinus tachycardia   Chronic combined systolic and diastolic congestive heart failure (HCC)   History of pulmonary embolus (PE)    Discharge Instructions  Discharge Instructions    Call MD for:   difficulty breathing, headache or visual disturbances    Complete by:  As directed    Call MD for:  extreme fatigue    Complete by:  As directed    Call MD for:  hives    Complete by:  As directed    Call MD for:  persistant dizziness or light-headedness    Complete by:  As directed    Call MD for:  persistant nausea and vomiting    Complete by:  As directed    Call MD for:  severe uncontrolled pain    Complete by:  As directed    Call MD for:  temperature >100.4    Complete by:  As directed    Diet - low sodium heart healthy    Complete by:  As directed    Diet Carb Modified    Complete by:  As directed    Discharge instructions    Complete by:  As directed    Take prescriptions as instructed Employ good hand washing techniques Disinfect common surfaces Monitor for change in respiratory status   Increase activity slowly    Complete by:  As directed      Allergies as of 11/09/2016      Reactions   Donepezil Nausea And Vomiting   Hydrocodone Nausea And Vomiting      Medication List    STOP taking these medications   bisacodyl 10 MG suppository Commonly known as:  DULCOLAX     TAKE these medications   albuterol 108 (90 Base) MCG/ACT inhaler Commonly known as:  PROVENTIL HFA;VENTOLIN HFA Inhale 2 puffs into the lungs every 6 (six) hours as needed for wheezing or shortness  of breath.   BD PEN NEEDLE NANO U/F 32G X 4 MM Misc Generic drug:  Insulin Pen Needle See admin instructions.   Carboxymethylcellul-Glycerin 0.5-0.9 % Soln Apply 1 drop to eye daily as needed (dry eye).   CLARITIN 10 MG Caps Generic drug:  Loratadine Take 1 capsule by mouth daily.   dextromethorphan-guaiFENesin 30-600 MG 12hr tablet Commonly known as:  MUCINEX DM Take 1 tablet by mouth 2 (two) times daily.   guaiFENesin-dextromethorphan 100-10 MG/5ML syrup Commonly known as:  ROBITUSSIN DM Take 5 mLs by mouth every 4 (four) hours as needed for cough.   DSS 100 MG Caps Take 200 mg by  mouth 2 (two) times daily as needed for mild constipation. What changed:  when to take this   ELDERTONIC PO Take 15 mLs by mouth 2 (two) times daily.   esomeprazole 40 MG capsule Commonly known as:  NEXIUM Take 1 capsule (40 mg total) by mouth 2 (two) times daily before a meal.   famotidine 20 MG tablet Commonly known as:  PEPCID Take 1 tablet (20 mg total) by mouth at bedtime.   furosemide 40 MG tablet Commonly known as:  LASIX Take 20-40 mg by mouth daily. takes 40 mg on MWFSun and 20mg  on TThSat   gabapentin 100 MG capsule Commonly known as:  NEURONTIN TAKE 2 CAPSULES BY MOUTH EVERY DAY AT BEDTIME   glipiZIDE 10 MG 24 hr tablet Commonly known as:  GLUCOTROL XL Take 10 mg by mouth 2 (two) times daily.   HUMALOG KWIKPEN 100 UNIT/ML KiwkPen Generic drug:  insulin lispro Inject 2-7 Units into the skin 2 (two) times daily with a meal. 2 units if sugar level 150-200 and 7 units when above 200   insulin detemir 100 UNIT/ML injection Commonly known as:  LEVEMIR Inject 0.1 mLs (10 Units total) into the skin daily at 10 pm. What changed:  how much to take  when to take this   memantine 10 MG tablet Commonly known as:  NAMENDA Take 1 tablet (10 mg total) by mouth 2 (two) times daily.   metoprolol tartrate 25 MG tablet Commonly known as:  LOPRESSOR Take 12.5 mg by mouth 2 (two) times daily.   oseltamivir 30 MG capsule Commonly known as:  TAMIFLU Take 1 capsule (30 mg total) by mouth 2 (two) times daily.   polyethylene glycol packet Commonly known as:  MIRALAX / GLYCOLAX Take 17 g by mouth daily as needed for mild constipation. Takes on MW   simvastatin 40 MG tablet Commonly known as:  ZOCOR Take 40 mg by mouth every morning.   warfarin 5 MG tablet Commonly known as:  COUMADIN Take 7.5 mg by mouth at bedtime.      Follow-up Information    HUSAIN,KARRAR, MD. Schedule an appointment as soon as possible for a visit in 1 week(s).   Specialty:  Internal  Medicine Contact information: [address] [place]  [address] [phone]          Allergies  Allergen Reactions  . Donepezil Nausea And Vomiting  . Hydrocodone Nausea And Vomiting    Consultations:  PT   Procedures/Studies: Dg Chest 2 View  Result Date: 11/08/2016 CLINICAL DATA:  Worsening cough and shortness of breath over the last few days, increasing weakness EXAM: CHEST  2 VIEW COMPARISON:  CT chest of 06/24/2015 and chest x-ray of the same date FINDINGS: No pneumonia is seen. No definite pleural effusion is noted. Minimally prominent interstitial markings remain particular at  the lung bases. Mediastinal and hilar contours are unremarkable. The heart is within normal limits in size. There are degenerative changes throughout the thoracic spine. IMPRESSION: No definite pneumonia or effusion. Minimally prominent interstitial markings remain at the lung bases right greater than left. Electronically Signed   By: Ivar Drape M.D.   On: 11/08/2016 08:05       Subjective: Patient seen during bedside rounds.  Patient daughter is bedside and asking appropriate questions.  Would like to know what she can do to deter the spread of the flu back at home.  Voices understanding of medication plan.  Asking for information about influenza and pneumonia.  Discharge Exam: Vitals:   11/08/16 2050 11/09/16 0506  BP: 119/62 130/67  Pulse: 92 (!) 111  Resp: 20 20  Temp: 100.2 F (37.9 C) 98.8 F (37.1 C)   Vitals:   11/08/16 1546 11/08/16 1608 11/08/16 2050 11/09/16 0506  BP: 138/70  119/62 130/67  Pulse: 98  92 (!) 111  Resp: 20  20 20   Temp: 98.5 F (36.9 C)  100.2 F (37.9 C) 98.8 F (37.1 C)  TempSrc: Oral  Oral Oral  SpO2: 98%  100% 100%  Weight: 102.4 kg (225 lb 12 oz)   101.7 kg (224 lb 3.3 oz)  Height:  6\' 1"  (1.854 m)      General: Pt is alert, awake, not in acute distress Cardiovascular: RRR, S1/S2 +, no rubs, no gallops Respiratory: CTA  bilaterally, few end expiratory wheezes Abdominal: Soft, NT, ND, bowel sounds + Extremities: no edema, no cyanosis    The results of significant diagnostics from this hospitalization (including imaging, microbiology, ancillary and laboratory) are listed below for reference.     Microbiology: Recent Results (from the past 240 hour(s))  Culture, blood (routine x 2)     Status: None (Preliminary result)   Collection Time: 11/08/16 10:40 AM  Result Value Ref Range Status   Specimen Description BLOOD LEFT ANTECUBITAL  Final   Special Requests BOTTLES DRAWN AEROBIC AND ANAEROBIC 5 CC EA  Final   Culture   Final    NO GROWTH < 24 HOURS Performed at [facility], [place] [address] [place], [place] [address]    Report Status PENDING  Incomplete  Culture, blood (routine x 2)     Status: None (Preliminary result)   Collection Time: 11/08/16 10:52 AM  Result Value Ref Range Status   Specimen Description BLOOD RAC  Final   Special Requests BOTTLES DRAWN AEROBIC AND ANAEROBIC 5 CC EA  Final   Culture   Final    NO GROWTH < 24 HOURS Performed at [facility], [place] [address], [place], [place] [address]    Report Status PENDING  Incomplete     Labs: BNP (last 3 results)  Recent Labs  11/08/16 0630  BNP [id number]   Basic Metabolic Panel:  Recent Labs Lab 11/08/16 0630 11/09/16 0548  NA 141 140  K 4.2 4.0  CL 107 107  CO2 28 27  GLUCOSE 66 125*  BUN 24* 22*  CREATININE 1.63* 1.56*  CALCIUM 8.9 8.4*   Liver Function Tests: No results for input(s): AST, ALT, ALKPHOS, BILITOT, PROT, ALBUMIN in the last 168 hours. No results for input(s): LIPASE, AMYLASE in the last 168 hours. No results for input(s): AMMONIA in the last 168 hours. CBC:  Recent Labs Lab 11/08/16 0630 11/09/16 0548  WBC 8.9 8.7  NEUTROABS 6.4  --   HGB 13.3 11.7*  HCT 39.8 35.5*  MCV 82.1 81.4  PLT 218 188   Cardiac Enzymes:  Recent Labs Lab 11/08/16 0630  TROPONINI <0.03   BNP: Invalid  input(s): POCBNP CBG:  Recent Labs Lab 11/08/16 1642 11/08/16 2243 11/09/16 0759 11/09/16 1217  GLUCAP 63* 70 121* 241*   D-Dimer No results for input(s): DDIMER in the last 72 hours. Hgb A1c No results for input(s): HGBA1C in the last 72 hours. Lipid Profile No results for input(s): CHOL, HDL, LDLCALC, TRIG, CHOLHDL, LDLDIRECT in the last 72 hours. Thyroid function studies No results for input(s): TSH, T4TOTAL, T3FREE, THYROIDAB in the last 72 hours.  Invalid input(s): FREET3 Anemia work up No results for input(s): VITAMINB12, FOLATE, FERRITIN, TIBC, IRON, RETICCTPCT in the last 72 hours. Urinalysis    Component Value Date/Time   COLORURINE YELLOW 06/24/2015 1940   APPEARANCEUR CLEAR 06/24/2015 1940   LABSPEC 1.021 06/24/2015 1940   PHURINE 5.5 06/24/2015 1940   GLUCOSEU NEGATIVE 06/24/2015 1940   HGBUR NEGATIVE 06/24/2015 1940   BILIRUBINUR NEGATIVE 06/24/2015 1940   [place] NEGATIVE 06/24/2015 1940   PROTEINUR NEGATIVE 06/24/2015 1940   UROBILINOGEN 1.0 06/24/2015 1940   NITRITE NEGATIVE 06/24/2015 1940   LEUKOCYTESUR NEGATIVE 06/24/2015 1940   Sepsis Labs Invalid input(s): PROCALCITONIN,  WBC,  LACTICIDVEN Microbiology Recent Results (from the past 240 hour(s))  Culture, blood (routine x 2)     Status: None (Preliminary result)   Collection Time: 11/08/16 10:40 AM  Result Value Ref Range Status   Specimen Description BLOOD LEFT ANTECUBITAL  Final   Special Requests BOTTLES DRAWN AEROBIC AND ANAEROBIC 5 CC EA  Final   Culture   Final    NO GROWTH < 24 HOURS Performed at [facility], [place] [address] [place], [place] [address]    Report Status PENDING  Incomplete  Culture, blood (routine x 2)     Status: None (Preliminary result)   Collection Time: 11/08/16 10:52 AM  Result Value Ref Range Status   Specimen Description BLOOD RAC  Final   Special Requests BOTTLES DRAWN AEROBIC AND ANAEROBIC 5 CC EA  Final   Culture   Final    NO GROWTH < 24  HOURS Performed at [facility], [place] [address] [place], [place] [address]    Report Status PENDING  Incomplete     Time coordinating discharge: Less than 30 minutes  SIGNED:   Loretha Stapler, MD  [facility] 11/09/2016, 1:18 PM Pager [phone] If 7PM-7AM, please contact night-coverage [email or web address] Password [email or web address]

## 2016-11-13 ENCOUNTER — Ambulatory Visit (INDEPENDENT_AMBULATORY_CARE_PROVIDER_SITE_OTHER): Payer: Medicare Other | Admitting: Podiatry

## 2016-11-13 DIAGNOSIS — B351 Tinea unguium: Secondary | ICD-10-CM | POA: Diagnosis not present

## 2016-11-13 DIAGNOSIS — M79676 Pain in unspecified toe(s): Secondary | ICD-10-CM | POA: Diagnosis not present

## 2016-11-13 DIAGNOSIS — E1151 Type 2 diabetes mellitus with diabetic peripheral angiopathy without gangrene: Secondary | ICD-10-CM

## 2016-11-13 LAB — CULTURE, BLOOD (ROUTINE X 2)
Culture: NO GROWTH
Culture: NO GROWTH

## 2016-11-13 NOTE — Progress Notes (Signed)
Patient ID: Calob L Tetrick Sr., male   DOB: 09/06/1920, 83 y.o.   MRN: [id number] HPI  Complaint:  Visit Type: Patient returns to my office for continued preventative foot care services. Complaint: Patient states" my nails have grown long and thick and become painful to walk and wear shoes" Patient has been diagnosed with DM . This patient  presents for preventative foot care services. No changes to ROS  Podiatric Exam: Vascular: dorsalis pedis and posterior tibial pulses are non-palpable. Capillary return is diminished. Cold feet noted.. Skin turgor WNL, bilateral swelling  Sensorium: Diminished  Semmes Weinstein monofilament test. Normal tactile sensation bilaterally.  Nail Exam: Pt has thick disfigured discolored nails with subungual debris noted bilateral entire nail hallux through fifth toenails Ulcer Exam: There is no evidence of ulcer or pre-ulcerative changes or infection. Orthopedic Exam: Muscle tone and strength are WNL. No limitations in general ROM. No crepitus or effusions noted. Foot type and digits show no abnormalities. Bony prominences are unremarkable. Skin: No Porokeratosis. No infection or ulcers  Diagnosis:  Onychomycosis, Pain in right toe, pain in left toes  Treatment & Plan Procedures and Treatment: Consent by patient was obtained for treatment procedures. The patient understood the discussion of treatment and procedures well. All questions were answered thoroughly reviewed. Debridement of mycotic and hypertrophic toenails, 1 through 5 bilateral and clearing of subungual debris. No ulceration, no infection noted.  Return Visit-Office Procedure: Patient instructed to return to the office for a follow up visit 3 months for continued evaluation and treatment.    Santia Labate DPM  

## 2016-11-19 ENCOUNTER — Other Ambulatory Visit: Payer: Self-pay | Admitting: Dermatology

## 2017-01-19 IMAGING — CT CT ANGIO CHEST
3 of 7 series · 18 of 36 positions shown · IV contrast (omnipaque)
Comparison: None.

CLINICAL DATA: Chest pain, shortness of breath since yesterday.

EXAM:
CT ANGIOGRAPHY CHEST WITH CONTRAST
TECHNIQUE: Multidetector CT imaging of the chest was performed using the
standard protocol during bolus administration of intravenous
contrast. Multiplanar CT image reconstructions and MIPs were
obtained to evaluate the vascular anatomy.
CONTRAST:  100mL OMNIPAQUE IOHEXOL 350 MG/ML SOLN

[Series 7: thins for pacs · axial · 0.74mm/px · z∈[+1630,+1864]mm · 15 of 268 slices shown]
[im 17/268  lung]
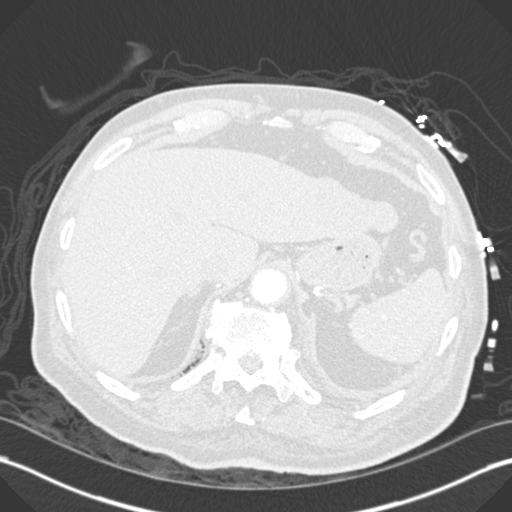
[im 34/268  mediastinal]
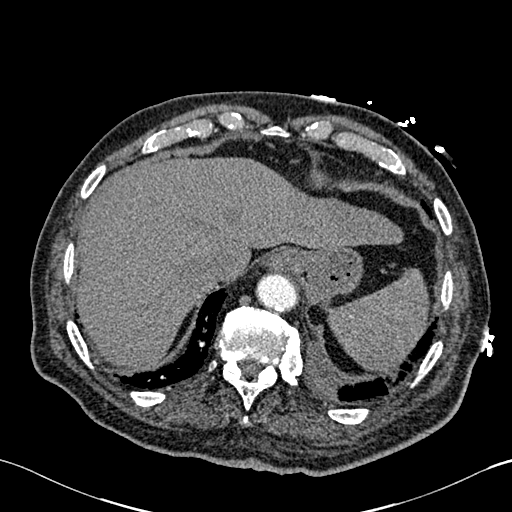
[im 51/268  lung]
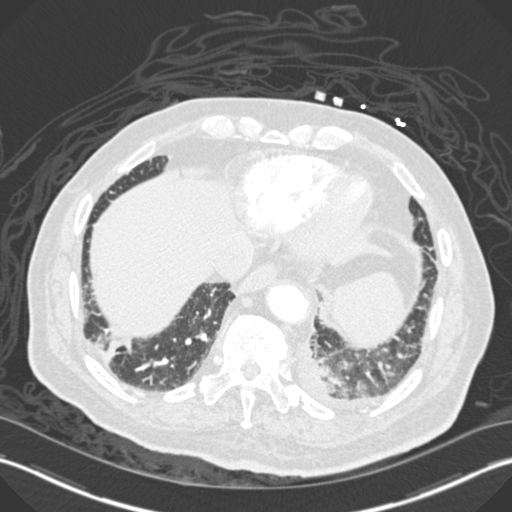
[im 67/268  mediastinal]
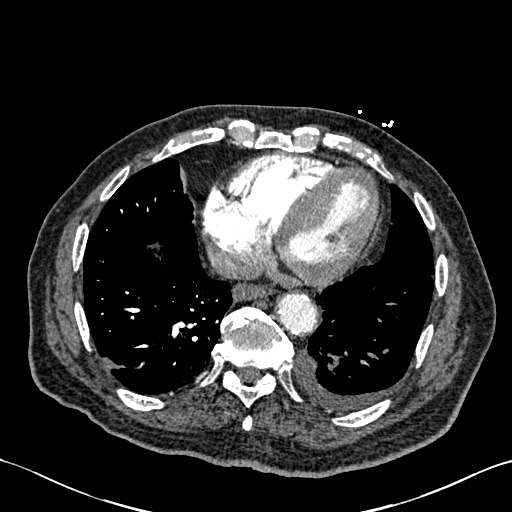
[im 84/268  lung]
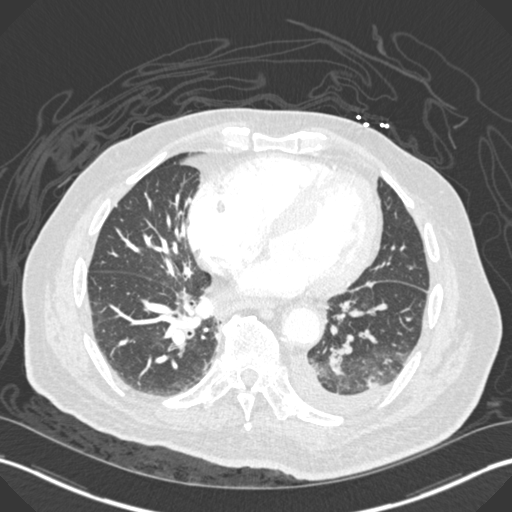
[im 101/268  mediastinal]
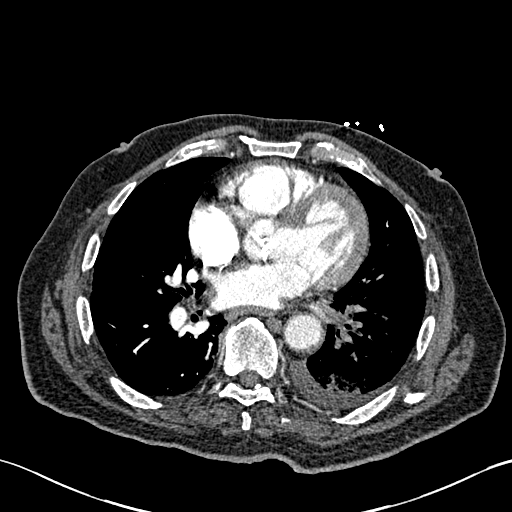
[im 117/268  lung]
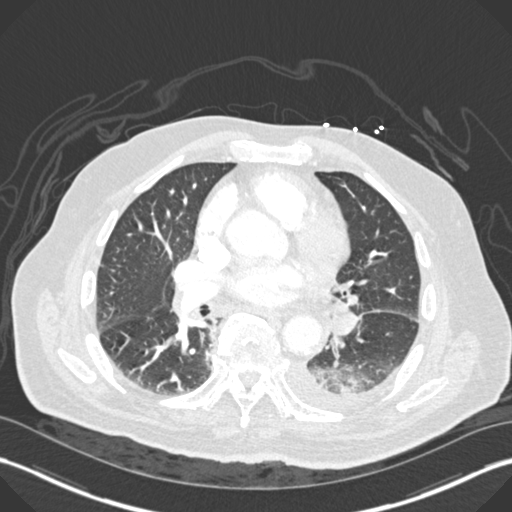
[im 134/268  mediastinal]
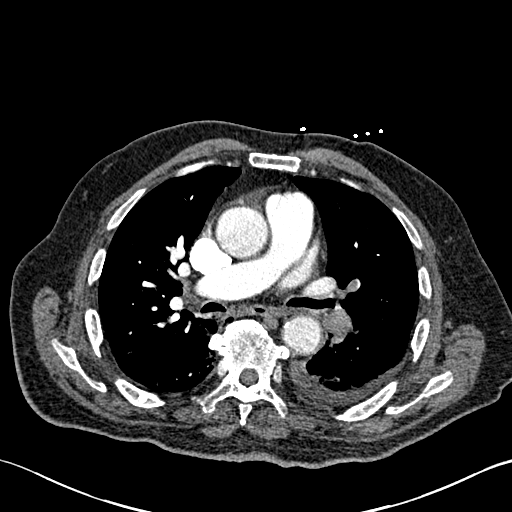
[im 151/268  lung]
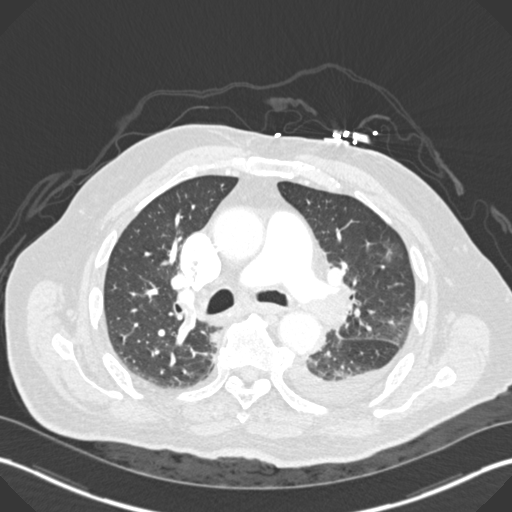
[im 167/268  mediastinal]
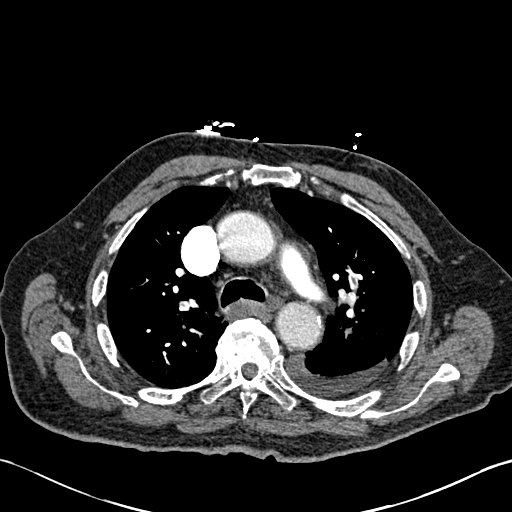
[im 184/268  lung]
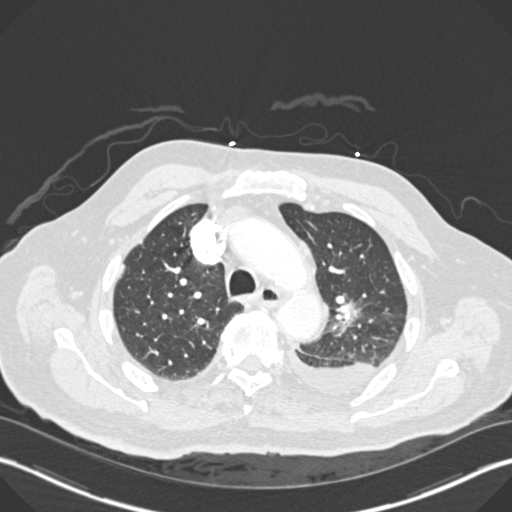
[im 201/268  mediastinal]
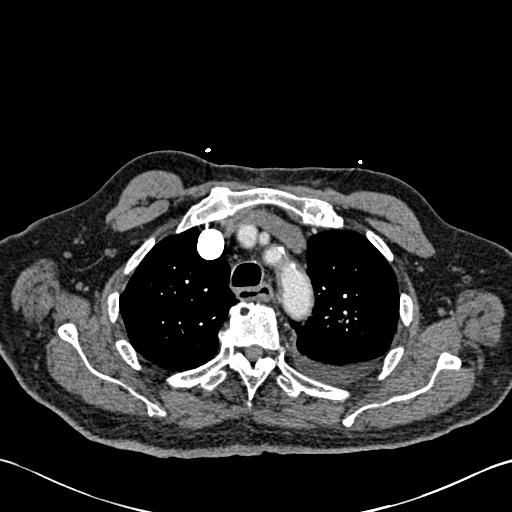
[im 217/268  lung]
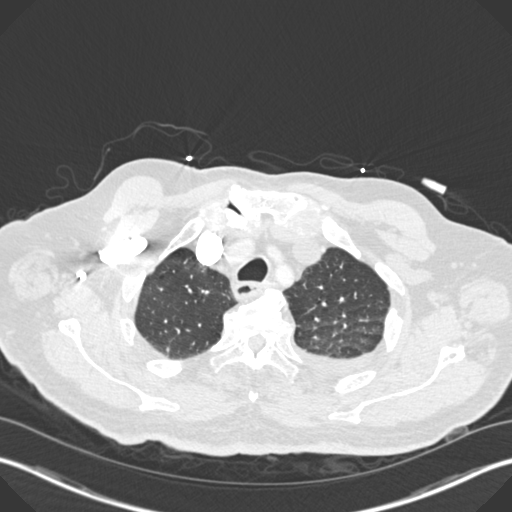
[im 234/268  mediastinal]
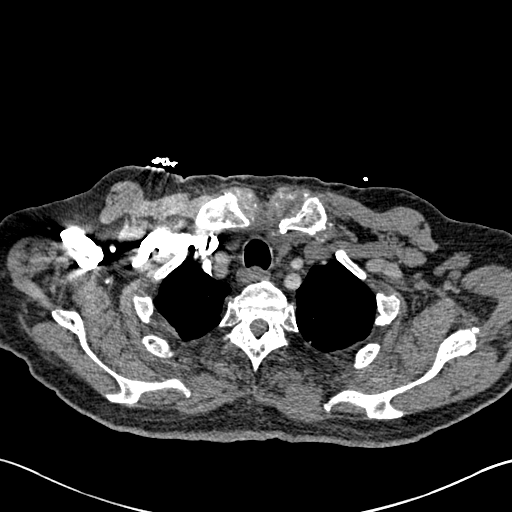
[im 251/268  lung]
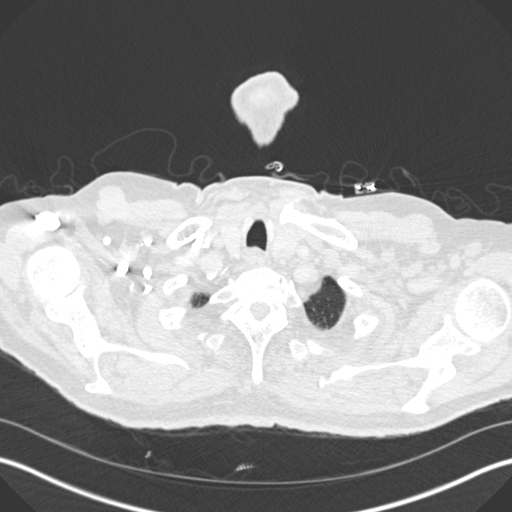

[Series 8: lung windows · axial · 0.74mm/px · z∈[+1688,+1752]mm · 2 of 86 slices shown]
[im 22/86  mediastinal]
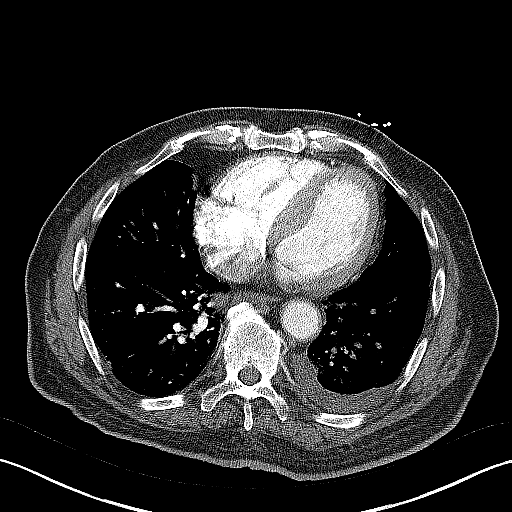
[im 43/86  mediastinal]
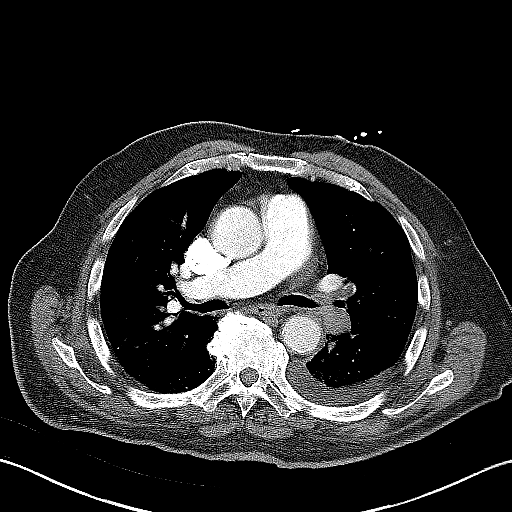

[Series 9: coronal mpr · coronal · 0.52mm/px · 1 of 128 slices shown]
[im 64/128  mediastinal]
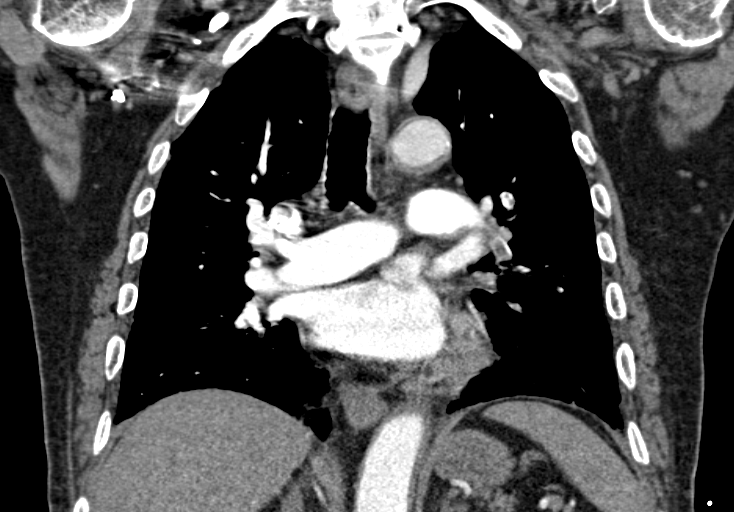

[18 of 36 positions shown; findings below may reference images not displayed]

FINDINGS: Chest wall: No chest wall mass, supraclavicular or axillary
lymphadenopathy. The left thyroid lobe contains a 15 mm nodule
posteriorly. Ultrasound followup is suggested. The bony thorax is
intact. No acute bony findings or destructive bony changes. Advanced
degenerative changes involving the thoracic spine and
sternoclavicular joints.

Mediastinum: The heart is borderline in size. No pericardial
effusion. The aorta is normal in caliber. Moderate atherosclerotic
calcifications. No dissection. There are three-vessel coronary
artery calcifications. No mediastinal or hilar mass or adenopathy.
The esophagus is grossly normal.

Pulmonary arteries: The main pulmonary artery is mildly enlarged.
The left pulmonary artery is completely occluded by clot. There is
also subsegmental pulmonary emboli on the right side. Findings of
right heart strain with RV LV ratio at greater than 1 (1.2).

Lungs/pleura: There is a small left pleural effusion and patchy
areas of bibasilar atelectasis. No worrisome pulmonary lesions. No
pulmonary edema.

Upper abdomen:  No significant findings.

Review of the MIP images confirms the above findings.
IMPRESSION: Positive for acute PE with CT evidence of right heart strain (RV/LV
Ratio = 1.2) consistent with at least submassive (intermediate risk)
PE. The presence of right heart strain has been associated with an
increased risk of morbidity and mortality. Please activate Code PE
by paging [phone].

These results were called by telephone at the time of interpretation
on 04/07/2015 at [DATE] to Dr. PAULUS N CEEJAY , who verbally
acknowledged these results.

## 2017-01-19 IMAGING — CR DG CHEST 2V
3 series · 3 of 3 positions shown · non-contrast
Comparison: April 01, 2015

CLINICAL DATA: Left-sided chest pain and cough for 1 week

EXAM:
CHEST  2 VIEW

[w chest lat]
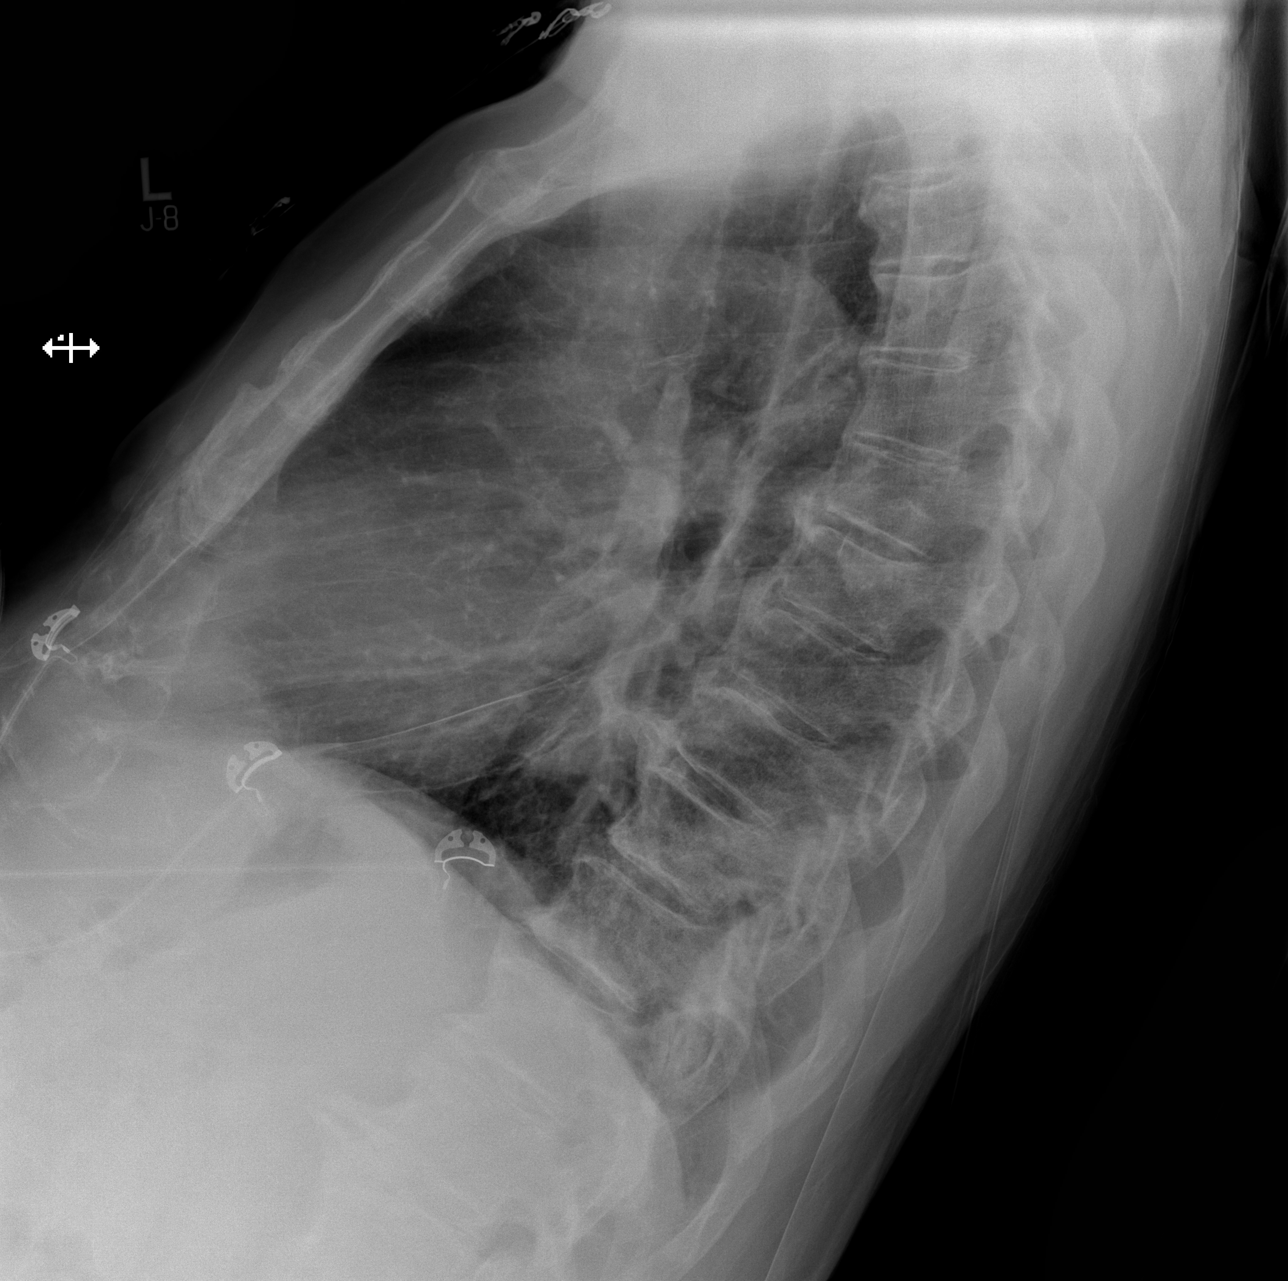

[x chest ap (1 of 2)]
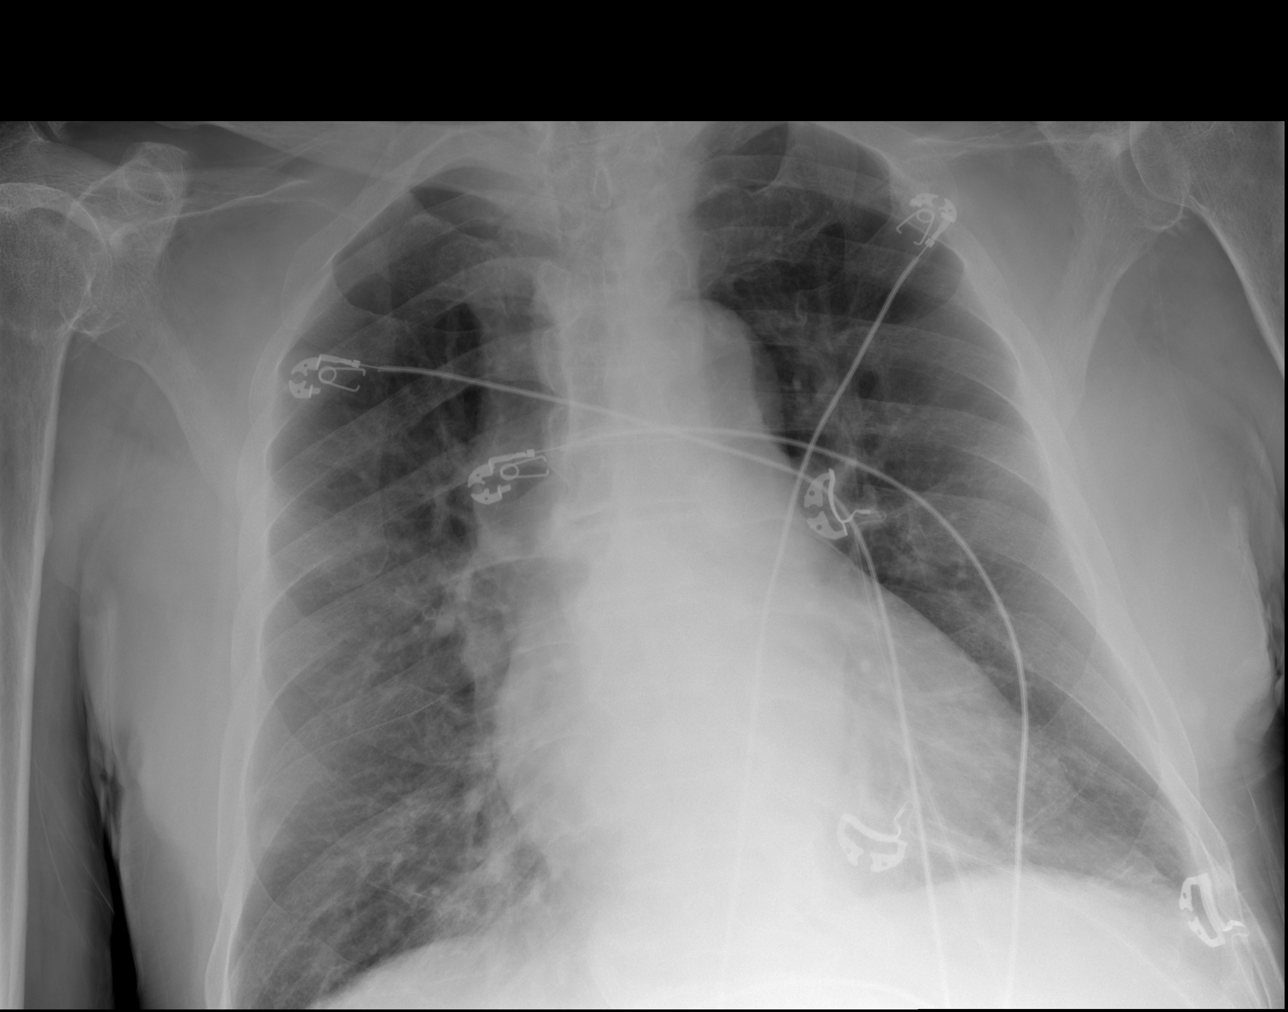

[x chest ap (2 of 2)]
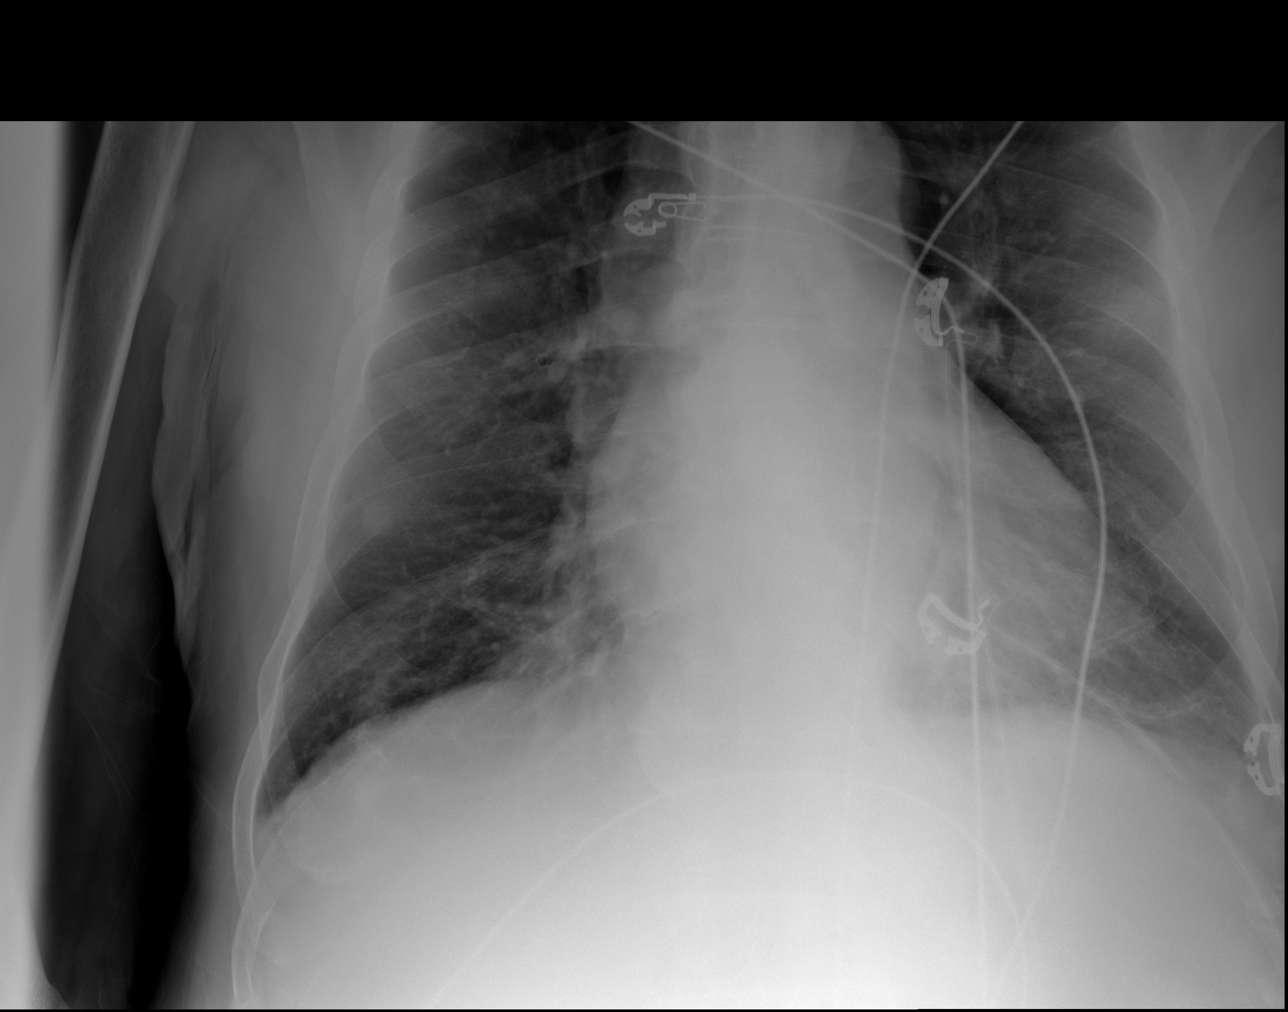

[3 of 3 positions shown; findings below may reference images not displayed]

FINDINGS: There is a nipple shadow on the right. There is no edema or
consolidation. Heart size is upper normal with pulmonary vascularity
within normal limits. No adenopathy. There is degenerative change in
the thoracic spine.
IMPRESSION: No edema or consolidation.

## 2017-01-27 ENCOUNTER — Emergency Department (HOSPITAL_COMMUNITY): Payer: Medicare Other

## 2017-01-27 ENCOUNTER — Encounter (HOSPITAL_COMMUNITY): Payer: Self-pay | Admitting: Emergency Medicine

## 2017-01-27 ENCOUNTER — Emergency Department (HOSPITAL_COMMUNITY)
Admission: EM | Admit: 2017-01-27 | Discharge: 2017-01-27 | Disposition: A | Discharge status: Home / Self Care | Payer: Medicare Other | Attending: Emergency Medicine | Admitting: Emergency Medicine

## 2017-01-27 DIAGNOSIS — Y999 Unspecified external cause status: Secondary | ICD-10-CM | POA: Diagnosis not present

## 2017-01-27 DIAGNOSIS — Z7901 Long term (current) use of anticoagulants: Secondary | ICD-10-CM | POA: Insufficient documentation

## 2017-01-27 DIAGNOSIS — J45909 Unspecified asthma, uncomplicated: Secondary | ICD-10-CM | POA: Insufficient documentation

## 2017-01-27 DIAGNOSIS — S8001XA Contusion of right knee, initial encounter: Secondary | ICD-10-CM | POA: Diagnosis not present

## 2017-01-27 DIAGNOSIS — E1142 Type 2 diabetes mellitus with diabetic polyneuropathy: Secondary | ICD-10-CM | POA: Insufficient documentation

## 2017-01-27 DIAGNOSIS — I5042 Chronic combined systolic (congestive) and diastolic (congestive) heart failure: Secondary | ICD-10-CM | POA: Diagnosis not present

## 2017-01-27 DIAGNOSIS — I110 Hypertensive heart disease with heart failure: Secondary | ICD-10-CM | POA: Diagnosis not present

## 2017-01-27 DIAGNOSIS — Z794 Long term (current) use of insulin: Secondary | ICD-10-CM | POA: Insufficient documentation

## 2017-01-27 DIAGNOSIS — Y939 Activity, unspecified: Secondary | ICD-10-CM | POA: Insufficient documentation

## 2017-01-27 DIAGNOSIS — Z79899 Other long term (current) drug therapy: Secondary | ICD-10-CM | POA: Diagnosis not present

## 2017-01-27 DIAGNOSIS — Z87891 Personal history of nicotine dependence: Secondary | ICD-10-CM | POA: Insufficient documentation

## 2017-01-27 DIAGNOSIS — S8991XA Unspecified injury of right lower leg, initial encounter: Secondary | ICD-10-CM | POA: Diagnosis present

## 2017-01-27 DIAGNOSIS — R531 Weakness: Secondary | ICD-10-CM | POA: Insufficient documentation

## 2017-01-27 DIAGNOSIS — Y92002 Bathroom of unspecified non-institutional (private) residence single-family (private) house as the place of occurrence of the external cause: Secondary | ICD-10-CM | POA: Diagnosis not present

## 2017-01-27 DIAGNOSIS — W19XXXA Unspecified fall, initial encounter: Secondary | ICD-10-CM | POA: Insufficient documentation

## 2017-01-27 DIAGNOSIS — Z8546 Personal history of malignant neoplasm of prostate: Secondary | ICD-10-CM | POA: Diagnosis not present

## 2017-01-27 LAB — URINALYSIS, ROUTINE W REFLEX MICROSCOPIC
Bilirubin Urine: NEGATIVE
GLUCOSE, UA: 50 mg/dL — AB
Hgb urine dipstick: NEGATIVE
KETONES UR: NEGATIVE mg/dL
LEUKOCYTES UA: NEGATIVE
Nitrite: NEGATIVE
PROTEIN: NEGATIVE mg/dL
Specific Gravity, Urine: 1.011 (ref 1.005–1.030)
pH: 5 (ref 5.0–8.0)

## 2017-01-27 LAB — CBC WITH DIFFERENTIAL/PLATELET
BASOS ABS: 0 10*3/uL (ref 0.0–0.1)
Basophils Relative: 0 %
EOS ABS: 0.2 10*3/uL (ref 0.0–0.7)
EOS PCT: 3 %
HCT: 38.4 % — ABNORMAL LOW (ref 39.0–52.0)
HEMOGLOBIN: 12.4 g/dL — AB (ref 13.0–17.0)
LYMPHS ABS: 1.7 10*3/uL (ref 0.7–4.0)
LYMPHS PCT: 24 %
MCH: 26.5 pg (ref 26.0–34.0)
MCHC: 32.3 g/dL (ref 30.0–36.0)
MCV: 82.1 fL (ref 78.0–100.0)
Monocytes Absolute: 0.6 10*3/uL (ref 0.1–1.0)
Monocytes Relative: 8 %
NEUTROS PCT: 65 %
Neutro Abs: 4.7 10*3/uL (ref 1.7–7.7)
PLATELETS: 175 10*3/uL (ref 150–400)
RBC: 4.68 MIL/uL (ref 4.22–5.81)
RDW: 15.4 % (ref 11.5–15.5)
WBC: 7.2 10*3/uL (ref 4.0–10.5)

## 2017-01-27 LAB — COMPREHENSIVE METABOLIC PANEL
ALT: 15 U/L — ABNORMAL LOW (ref 17–63)
AST: 21 U/L (ref 15–41)
Albumin: 3.3 g/dL — ABNORMAL LOW (ref 3.5–5.0)
Alkaline Phosphatase: 70 U/L (ref 38–126)
Anion gap: 4 — ABNORMAL LOW (ref 5–15)
BILIRUBIN TOTAL: 0.8 mg/dL (ref 0.3–1.2)
BUN: 29 mg/dL — ABNORMAL HIGH (ref 6–20)
CO2: 29 mmol/L (ref 22–32)
CREATININE: 1.78 mg/dL — AB (ref 0.61–1.24)
Calcium: 9.1 mg/dL (ref 8.9–10.3)
Chloride: 103 mmol/L (ref 101–111)
GFR, EST AFRICAN AMERICAN: 36 mL/min — AB (ref >60)
GFR, EST NON AFRICAN AMERICAN: 31 mL/min — AB (ref >60)
Glucose, Bld: 132 mg/dL — ABNORMAL HIGH (ref 65–99)
POTASSIUM: 4.1 mmol/L (ref 3.5–5.1)
Sodium: 136 mmol/L (ref 135–145)
TOTAL PROTEIN: 7 g/dL (ref 6.5–8.1)

## 2017-01-27 LAB — PROTIME-INR
INR: 3.29
PROTHROMBIN TIME: 34.2 s — AB (ref 11.4–15.2)

## 2017-01-27 NOTE — ED Triage Notes (Addendum)
Received pt from home with c/o fall x 2. Pt first fell at midnight and then again this morning. Upon arrival of EMS pt was sitting on bathroom floor. Pt denies any complaints. Daughter wanted pt to come to ED because he fell twice. Pt c/o right leg pain from knee to foot.

## 2017-01-27 NOTE — ED Provider Notes (Signed)
[facility] DEPT Provider Note   CSN: [id number] Arrival date & time: 01/27/17  0847     History   Chief Complaint Chief Complaint  Patient presents with  . Fall  . Leg Pain    HPI Marvin HUFNAGLE Sr. is a 83 y.o. male.  Patient is a 83 year old male with a history of dementia so history is limited other than report from the patient's daughter. Patient lives at home with 24-hour care from family. He has a history of diabetes, hypertension, hyperlipidemia and pulmonary embolus, on Coumadin. He has had 2 falls during the night. His daughter states that she found him on the floor in the bathroom both times. It is unclear what has happened. She states that he's had some increased weakness over the last 3-4 weeks. He now uses a walker to ambulate where he was not doing that before. Other than that he doesn't really have any change in behavior. No increased confusion. No fevers. He has a mild cough which seems similar to baseline. They attributed this to his CHF.  There is no increased leg swelling. No fevers. He is urinating normally. She does note over the last week or so he's had a little bit of looser stools than normal. He has about 2 stools a day which is more than he typically has. He hasn't been complaining of any abdominal pain. As per the fall, he's been complaining of some right knee pain but hasn't been complaining of other injuries.      Past Medical History:  Diagnosis Date  . Arthritis   . Asthma    hx of as child  . Complete loss of vision    right eye  . Dementia   . Diabetes mellitus   . Headache(784.0)    slight  . Hypercholesteremia   . Hypertension   . Insomnia    occasional  . Memory loss   . Nephrolithiasis    right UVJ stone  . Peripheral neuropathy    both legs  . PONV (postoperative nausea and vomiting)   . Prostate cancer [facility])     Patient Active Problem List   Diagnosis Date Noted  . Influenza 11/08/2016  . Chronic combined systolic and  diastolic congestive heart failure ([place]) 11/08/2016  . History of pulmonary embolus (PE) 11/08/2016  . PE (pulmonary embolism) 04/07/2015  . Pulmonary embolism ([place]) 04/07/2015  . Elevated troponin 04/07/2015  . Cough   . Pulmonary emboli ([place])   . Sinus tachycardia 04/01/2015  . Acid reflux 03/07/2015  . Blind painful eye 01/07/2015  . Ileus ([place]) 08/23/2014  . Dementia without behavioral disturbance 09/02/2013  . Renal calculus or stone 08/22/2013  . Hypokalemia 08/22/2013  . Hypertension 08/22/2013  . IDDM (insulin dependent diabetes mellitus) ([place]) 08/22/2013  . Dehydration 08/22/2013  . Memory loss 04/23/2013  . Insomnia   . Carpal tunnel syndrome   . Diverticulosis     Past Surgical History:  Procedure Laterality Date  . APPENDECTOMY    . Cornea removed Right 02-26-15  . CYSTOSCOPY W/ URETERAL STENT PLACEMENT Right 10/01/2013   Procedure: cystoscopy right retrograde holmium laser lithotripsy;  Surgeon: Franchot Gallo, MD;  Location: [facility];  Service: Urology;  Laterality: Right;  holmium laser application  . HEMORROIDECTOMY    . KNEE ARTHROSCOPY Right   . PARS PLANA VITRECTOMY  10/24/2011   Procedure: PARS PLANA VITRECTOMY WITH 23 GAUGE;  Surgeon: Adonis Brook, MD;  Location: [place];  Service: Ophthalmology;  Laterality: Right;  .  TONSILLECTOMY  as teen       Home Medications    Prior to Admission medications   Medication Sig Start Date End Date Taking? Authorizing Provider  albuterol (PROVENTIL HFA;VENTOLIN HFA) 108 (90 Base) MCG/ACT inhaler Inhale 2 puffs into the lungs every 6 (six) hours as needed for wheezing or shortness of breath. 11/09/16   Eber Jones, MD  BD PEN NEEDLE NANO U/F 32G X 4 MM MISC See admin instructions. 07/13/15   Historical Provider, MD  Carboxymethylcellul-Glycerin 0.5-0.9 % SOLN Apply 1 drop to eye daily as needed (dry eye).    Historical Provider, MD  dextromethorphan-guaiFENesin (MUCINEX DM) 30-600 MG 12hr tablet Take 1 tablet by  mouth 2 (two) times daily. 11/09/16   Eber Jones, MD  docusate sodium 100 MG CAPS Take 200 mg by mouth 2 (two) times daily as needed for mild constipation. Patient taking differently: Take 200 mg by mouth daily.  08/25/14   Thurnell Lose, MD  esomeprazole (NEXIUM) 40 MG capsule Take 1 capsule (40 mg total) by mouth 2 (two) times daily before a meal. 04/04/15   Barton Dubois, MD  famotidine (PEPCID) 20 MG tablet Take 1 tablet (20 mg total) by mouth at bedtime. 04/04/15   Barton Dubois, MD  furosemide (LASIX) 40 MG tablet Take 20-40 mg by mouth daily. takes 40 mg on MWFSun and 20mg  on TThSat    Historical Provider, MD  gabapentin (NEURONTIN) 100 MG capsule TAKE 2 CAPSULES BY MOUTH EVERY DAY AT BEDTIME 09/21/15   Historical Provider, MD  glipiZIDE (GLUCOTROL XL) 10 MG 24 hr tablet Take 10 mg by mouth 2 (two) times daily.    Historical Provider, MD  guaiFENesin-dextromethorphan (ROBITUSSIN DM) 100-10 MG/5ML syrup Take 5 mLs by mouth every 4 (four) hours as needed for cough. 11/09/16   Eber Jones, MD  HUMALOG KWIKPEN 100 UNIT/ML KiwkPen Inject 2-7 Units into the skin 2 (two) times daily with a meal. 2 units if sugar level 150-200 and 7 units when above 200 06/08/16   Historical Provider, MD  insulin detemir (LEVEMIR) 100 UNIT/ML injection Inject 0.1 mLs (10 Units total) into the skin daily at 10 pm. Patient taking differently: Inject 20 Units into the skin 2 (two) times daily.  04/10/15   Silver Huguenin Elgergawy, MD  Loratadine (CLARITIN) 10 MG CAPS Take 1 capsule by mouth daily.    Historical Provider, MD  memantine (NAMENDA) 10 MG tablet Take 1 tablet (10 mg total) by mouth 2 (two) times daily. 03/29/16   Marcial Pacas, MD  metoprolol tartrate (LOPRESSOR) 25 MG tablet Take 12.5 mg by mouth 2 (two) times daily.  06/23/15   Historical Provider, MD  Multiple Vitamins-Minerals (ELDERTONIC PO) Take 15 mLs by mouth 2 (two) times daily.    Historical Provider, MD  polyethylene glycol (MIRALAX / GLYCOLAX)  packet Take 17 g by mouth daily as needed for mild constipation. Takes on [facility] 04/29/15   Historical Provider, MD  simvastatin (ZOCOR) 40 MG tablet Take 40 mg by mouth every morning.     Historical Provider, MD  warfarin (COUMADIN) 5 MG tablet Take 7.5 mg by mouth at bedtime.  11/16/15   Historical Provider, MD    Family History Family History  Problem Relation Age of Onset  . Cancer Mother     uterus  . Heart Problems Father   . Cancer Brother     stomach  . Cancer Brother     throat  . Colon cancer Brother  Social History Social History  Substance Use Topics  . Smoking status: Former Smoker    Packs/day: 0.25    Types: Cigarettes    Quit date: 10/15/1966  . Smokeless tobacco: Never Used  . Alcohol use 0.0 oz/week     Comment: occasional     Allergies   Donepezil and Hydrocodone   Review of Systems Review of Systems  Unable to perform ROS: Dementia     Physical Exam Updated Vital Signs BP 123/74   Pulse 86   Temp 98.2 F (36.8 C) (Oral)   Resp 19   Ht 6\' 1"  (1.854 m)   Wt 214 lb (97.1 kg)   SpO2 97%   BMI 28.23 kg/m   Physical Exam  Constitutional: He is oriented to person, place, and time. He appears well-developed and well-nourished.  HENT:  Head: Normocephalic and atraumatic.  Eyes: Pupils are equal, round, and reactive to light.  Neck:  C-collar present, no definite tenderness along the spine  Cardiovascular: Normal rate, regular rhythm and normal heart sounds.   Pulmonary/Chest: Effort normal and breath sounds normal. No respiratory distress. He has no wheezes. He has no rales. He exhibits no tenderness.  Abdominal: Soft. Bowel sounds are normal. There is no tenderness. There is no rebound and no guarding.  Musculoskeletal: Normal range of motion. He exhibits no edema.  There is an abrasion overlying the right knee. There is no swelling or effusion noted. There some mild tenderness on palpation of the anterior right knee. No other pain on palpation  or range of motion of the extremities including the hips. There is no pain to the right lower leg or ankle.   Lymphadenopathy:    He has no cervical adenopathy.  Neurological: He is alert and oriented to person, place, and time.  Skin: Skin is warm and dry. No rash noted.  Psychiatric: He has a normal mood and affect.     ED Treatments / Results  Labs (all labs ordered are listed, but only abnormal results are displayed) Labs Reviewed  COMPREHENSIVE METABOLIC PANEL - Abnormal; Notable for the following:       Result Value   Glucose, Bld 132 (*)    BUN 29 (*)    Creatinine, Ser 1.78 (*)    Albumin 3.3 (*)    ALT 15 (*)    GFR calc non Af Amer 31 (*)    GFR calc Af Amer 36 (*)    Anion gap 4 (*)    All other components within normal limits  CBC WITH DIFFERENTIAL/PLATELET - Abnormal; Notable for the following:    Hemoglobin 12.4 (*)    HCT 38.4 (*)    All other components within normal limits  URINALYSIS, ROUTINE W REFLEX MICROSCOPIC - Abnormal; Notable for the following:    Glucose, UA 50 (*)    All other components within normal limits  PROTIME-INR - Abnormal; Notable for the following:    Prothrombin Time 34.2 (*)    All other components within normal limits    EKG  EKG Interpretation  Date/Time:  Sunday January 27 2017 09:21:01 EDT Ventricular Rate:  83 PR Interval:    QRS Duration: 101 QT Interval:  379 QTC Calculation: 446 R Axis:   -11 Text Interpretation:  Sinus rhythm RSR' in V1 or V2, right VCD or RVH since last tracing no significant change Confirmed by Myda Detwiler  MD, Linden Mikes ([id number]) on 01/27/2017 9:36:59 AM Also confirmed by Analeese Andreatta  MD, Cadee Agro ([id number]), editor Nelly Laurence, [place] [phone])  on 01/27/2017 9:48:49 AM       Radiology Dg Chest 2 View  Result Date: 01/27/2017 CLINICAL DATA:  fall x 2. Pt first fell at midnight and then again this morning. Upon arrival of EMS pt was sitting on bathroom floor. Pt denies any complaints. EXAM: CHEST  2 VIEW COMPARISON:   Radiograph 11/08/2016 FINDINGS: Normal cardiac silhouette. No pulmonary contusion pleural fluid. No pneumothorax. No fracture identified. Degenerative osteophytosis of the spine. IMPRESSION: No radiographic evidence of thoracic trauma. No pulmonary infection. Electronically Signed   By: Suzy Bouchard M.D.   On: 01/27/2017 11:24   Ct Head Wo Contrast  Result Date: 01/27/2017 CLINICAL DATA:  Pt fell twice last night and this morning but he has no complaints. His daughter wanted him checked because he had fallen twice EXAM: CT HEAD WITHOUT CONTRAST CT CERVICAL SPINE WITHOUT CONTRAST TECHNIQUE: Multidetector CT imaging of the head and cervical spine was performed following the standard protocol without intravenous contrast. Multiplanar CT image reconstructions of the cervical spine were also generated. COMPARISON:  None. FINDINGS: CT HEAD FINDINGS Brain: No intracranial hemorrhage. No parenchymal contusion. No midline shift or mass effect. Basilar cisterns are patent. No skull base fracture. No fluid in the paranasal sinuses or mastoid air cells. Orbits are normal. There are periventricular and subcortical white matter hypodensities. Generalized cortical atrophy. Vascular: No hyperdense vessel or unexpected calcification. Skull: Normal. Negative for fracture or focal lesion. Sinuses/Orbits: Paranasal sinuses and mastoid air cells are clear. Orbits are clear. Other: None. CT CERVICAL SPINE FINDINGS Alignment: Normal alignment of the cervical vertebral bodies. Skull base and vertebrae: Normal craniocervical junction. No loss of bowel vertebral body height or disc height. Normal facet articulation. No evidence of fracture. Soft tissues and spinal canal: No prevertebral soft tissue swelling. No perispinal or epidural hematoma. Disc levels: There is pannus formation posterior to the C2 vertebral body. There is joint space narrowing at C3-C4 with fusion. Joint space narrowing at C5-C6. There is bulky anterior end-plate  osteophytosis from C3-C7. Upper chest: Clear Other: None IMPRESSION: 1. No intracranial trauma. 2. Atrophy and white matter microvascular disease. 3. No cervical spine fracture. 4. Multilevel disc osteophytic disease without acute findings Electronically Signed   By: Suzy Bouchard M.D.   On: 01/27/2017 10:26   Ct Cervical Spine Wo Contrast  Result Date: 01/27/2017 CLINICAL DATA:  Pt fell twice last night and this morning but he has no complaints. His daughter wanted him checked because he had fallen twice EXAM: CT HEAD WITHOUT CONTRAST CT CERVICAL SPINE WITHOUT CONTRAST TECHNIQUE: Multidetector CT imaging of the head and cervical spine was performed following the standard protocol without intravenous contrast. Multiplanar CT image reconstructions of the cervical spine were also generated. COMPARISON:  None. FINDINGS: CT HEAD FINDINGS Brain: No intracranial hemorrhage. No parenchymal contusion. No midline shift or mass effect. Basilar cisterns are patent. No skull base fracture. No fluid in the paranasal sinuses or mastoid air cells. Orbits are normal. There are periventricular and subcortical white matter hypodensities. Generalized cortical atrophy. Vascular: No hyperdense vessel or unexpected calcification. Skull: Normal. Negative for fracture or focal lesion. Sinuses/Orbits: Paranasal sinuses and mastoid air cells are clear. Orbits are clear. Other: None. CT CERVICAL SPINE FINDINGS Alignment: Normal alignment of the cervical vertebral bodies. Skull base and vertebrae: Normal craniocervical junction. No loss of bowel vertebral body height or disc height. Normal facet articulation. No evidence of fracture. Soft tissues and spinal canal: No prevertebral soft tissue swelling. No perispinal or epidural hematoma. Disc levels:  There is pannus formation posterior to the C2 vertebral body. There is joint space narrowing at C3-C4 with fusion. Joint space narrowing at C5-C6. There is bulky anterior end-plate  osteophytosis from C3-C7. Upper chest: Clear Other: None IMPRESSION: 1. No intracranial trauma. 2. Atrophy and white matter microvascular disease. 3. No cervical spine fracture. 4. Multilevel disc osteophytic disease without acute findings Electronically Signed   By: Suzy Bouchard M.D.   On: 01/27/2017 10:26   Dg Knee Complete 4 Views Right  Result Date: 01/27/2017 CLINICAL DATA:  Fall at midnight and again this morning. EXAM: RIGHT KNEE - COMPLETE 4+ VIEW COMPARISON:  None. FINDINGS: The right knee is located. Asymmetric degenerative changes are present within the medial compartment of the knee. Chondrocalcinosis is noted. Patellofemoral degenerative changes are noted. There is no significant joint effusion. Vascular calcifications are present. IMPRESSION: 1. Degenerative changes of the knee are most prominent in the medial and patellofemoral compartments. 2. No acute fracture, dislocation, or effusion. 3. Atherosclerosis. 4. Chondrocalcinosis of the menisci suggests CPPD arthropathy. Electronically Signed   By: San Morelle M.D.   On: 01/27/2017 10:31    Procedures Procedures (including critical care time)  Medications Ordered in ED Medications - No data to display   Initial Impression / Assessment and Plan / ED Course  I have reviewed the triage vital signs and the nursing notes.  Pertinent labs & imaging results that were available during my care of the patient were reviewed by me and considered in my medical decision making (see chart for details).  Clinical Course as of Jan 28 1199  Sun Jan 27, 2017  1136 Was called in to room because daughter felt that his right back or hip may be hurting.  When asked the patient, he said it was on the left side. He has no bony tenderness to the lumbar spine. There is no tenderness on palpation of musculature the back. He has full range of motion in his hips without evident discomfort. This point I don't feel he needs further imaging studies.   [MB]    Clinical Course User Index [MB] Malvin Johns, MD    Patient presents after a fall 2. It's unclear the etiology of the fall but it sounds like he's gotten increasingly weak over the last month. Patient is alert and at his baseline mental status. There is no signs of infection. No evidence of pneumonia. No evidence of UTI. No evidence of intracranial hemorrhage or acute bony injury. No symptoms that would be more consistent with a stroke. This could be a normal progressive decline.  I discussed with the patient's father that he may need increased services at home or placement in an assisted living facility. She is going to have close follow-up with the patient's primary care physician to discuss this. I also discussed that it may be prudent to stop the Coumadin but you should also discuss this with his primary care provider. He has been able to ambulate here with a walker. He doesn't have any evident pain on weightbearing of the lower extremities. Return precautions were given.  Final Clinical Impressions(s) / ED Diagnoses   Final diagnoses:  Fall  Fall, initial encounter  Generalized weakness  Contusion of right knee, initial encounter    New Prescriptions New Prescriptions   No medications on file     Malvin Johns, MD 01/27/17 1201

## 2017-01-27 NOTE — Discharge Instructions (Signed)
You need to discuss with your primary care provider whether patient needs increased home health or possible placement in an assisted living facility. He should also discuss with your provider whether it is prudent to continue the Coumadin given the recent falls.

## 2017-01-27 NOTE — ED Notes (Signed)
Pt c/o new rt hip pain and bruising on rt eye. EDP made aware.

## 2017-01-27 NOTE — ED Notes (Signed)
Patient transported to CT 

## 2017-01-27 NOTE — ED Notes (Signed)
Patient transported to X-ray 

## 2017-01-27 NOTE — ED Notes (Signed)
ED Provider at bedside. 

## 2017-01-29 ENCOUNTER — Ambulatory Visit: Payer: Self-pay | Admitting: Oncology

## 2017-01-29 ENCOUNTER — Other Ambulatory Visit: Payer: Self-pay

## 2017-02-12 ENCOUNTER — Ambulatory Visit (INDEPENDENT_AMBULATORY_CARE_PROVIDER_SITE_OTHER): Payer: Medicare Other | Admitting: Podiatry

## 2017-02-12 DIAGNOSIS — E1151 Type 2 diabetes mellitus with diabetic peripheral angiopathy without gangrene: Secondary | ICD-10-CM

## 2017-02-12 DIAGNOSIS — B351 Tinea unguium: Secondary | ICD-10-CM

## 2017-02-12 DIAGNOSIS — M79676 Pain in unspecified toe(s): Secondary | ICD-10-CM

## 2017-02-12 NOTE — Progress Notes (Signed)
Patient ID: Marvin GAHAN Sr., male   DOB: [birth year], 83 y.o.   MRN: [id number] HPI  Complaint:  Visit Type: Patient returns to my office for continued preventative foot care services. Complaint: Patient states" my nails have grown long and thick and become painful to walk and wear shoes" Patient has been diagnosed with DM . This patient  presents for preventative foot care services. No changes to ROS  Podiatric Exam: Vascular: dorsalis pedis and posterior tibial pulses are non-palpable. Capillary return is diminished. Cold feet noted.. Skin turgor WNL, bilateral swelling  Sensorium: Diminished  Semmes Weinstein monofilament test. Normal tactile sensation bilaterally.  Nail Exam: Pt has thick disfigured discolored nails with subungual debris noted bilateral entire nail hallux through fifth toenails Ulcer Exam: There is no evidence of ulcer or pre-ulcerative changes or infection. Orthopedic Exam: Muscle tone and strength are WNL. No limitations in general ROM. No crepitus or effusions noted. Foot type and digits show no abnormalities. Bony prominences are unremarkable. Skin: No Porokeratosis. No infection or ulcers  Diagnosis:  Onychomycosis, Pain in right toe, pain in left toes  Treatment & Plan Procedures and Treatment: Consent by patient was obtained for treatment procedures. The patient understood the discussion of treatment and procedures well. All questions were answered thoroughly reviewed. Debridement of mycotic and hypertrophic toenails, 1 through 5 bilateral and clearing of subungual debris. No ulceration, no infection noted.  Return Visit-Office Procedure: Patient instructed to return to the office for a follow up visit 3 months for continued evaluation and treatment.    Gardiner Barefoot DPM

## 2017-02-27 ENCOUNTER — Other Ambulatory Visit (HOSPITAL_BASED_OUTPATIENT_CLINIC_OR_DEPARTMENT_OTHER): Payer: Self-pay

## 2017-02-27 ENCOUNTER — Ambulatory Visit (HOSPITAL_BASED_OUTPATIENT_CLINIC_OR_DEPARTMENT_OTHER): Payer: Self-pay | Admitting: Oncology

## 2017-02-27 VITALS — BP 119/67 | HR 94 | Temp 97.6°F | Resp 18 | Ht 73.0 in

## 2017-02-27 DIAGNOSIS — D729 Disorder of white blood cells, unspecified: Secondary | ICD-10-CM

## 2017-02-27 DIAGNOSIS — D472 Monoclonal gammopathy: Secondary | ICD-10-CM

## 2017-02-27 DIAGNOSIS — I2699 Other pulmonary embolism without acute cor pulmonale: Secondary | ICD-10-CM

## 2017-02-27 DIAGNOSIS — N289 Disorder of kidney and ureter, unspecified: Secondary | ICD-10-CM

## 2017-02-27 DIAGNOSIS — E119 Type 2 diabetes mellitus without complications: Secondary | ICD-10-CM

## 2017-02-27 LAB — CBC WITH DIFFERENTIAL/PLATELET
BASO%: 0.7 % (ref 0.0–2.0)
Basophils Absolute: 0 10*3/uL (ref 0.0–0.1)
EOS%: 2 % (ref 0.0–7.0)
Eosinophils Absolute: 0.1 10*3/uL (ref 0.0–0.5)
HCT: 42.3 % (ref 38.4–49.9)
HGB: 14 g/dL (ref 13.0–17.1)
LYMPH#: 1.5 10*3/uL (ref 0.9–3.3)
LYMPH%: 21.2 % (ref 14.0–49.0)
MCH: 27.2 pg (ref 27.2–33.4)
MCHC: 33 g/dL (ref 32.0–36.0)
MCV: 82.6 fL (ref 79.3–98.0)
MONO#: 0.7 10*3/uL (ref 0.1–0.9)
MONO%: 9.5 % (ref 0.0–14.0)
NEUT%: 66.6 % (ref 39.0–75.0)
NEUTROS ABS: 4.7 10*3/uL (ref 1.5–6.5)
PLATELETS: 213 10*3/uL (ref 140–400)
RBC: 5.13 10*6/uL (ref 4.20–5.82)
RDW: 15.6 % — ABNORMAL HIGH (ref 11.0–14.6)
WBC: 7.1 10*3/uL (ref 4.0–10.3)

## 2017-02-27 LAB — COMPREHENSIVE METABOLIC PANEL
ALT: 15 U/L (ref 0–55)
ANION GAP: 7 meq/L (ref 3–11)
AST: 19 U/L (ref 5–34)
Albumin: 3.6 g/dL (ref 3.5–5.0)
Alkaline Phosphatase: 86 U/L (ref 40–150)
BILIRUBIN TOTAL: 0.6 mg/dL (ref 0.20–1.20)
BUN: 22.8 mg/dL (ref 7.0–26.0)
CHLORIDE: 105 meq/L (ref 98–109)
CO2: 29 meq/L (ref 22–29)
CREATININE: 1.6 mg/dL — AB (ref 0.7–1.3)
Calcium: 9.6 mg/dL (ref 8.4–10.4)
EGFR: 42 mL/min/{1.73_m2} — ABNORMAL LOW (ref >90)
GLUCOSE: 249 mg/dL — AB (ref 70–140)
Potassium: 4.2 mEq/L (ref 3.5–5.1)
Sodium: 142 mEq/L (ref 136–145)
TOTAL PROTEIN: 7.8 g/dL (ref 6.4–8.3)

## 2017-02-27 NOTE — Progress Notes (Signed)
Hematology and Oncology Follow Up Visit  Marvin Bishop [id number] 06/07/1920 83 y.o. 02/27/2017 3:30 PM   Principle Diagnosis: 83 year old gentleman with monoclonal gammopathy with IgG kappa subtype diagnosed in July 2016. He was found to have an M spike of 0.6 g/dL and a normal IgG level. He had an elevated light chain ratio. No evidence of clear-cut end organ damage on his initial workup.  Current therapy: Observation and surveillance.  Interim History:  Marvin Bishop presents today for a follow-up visit with his family. Since the last visit, he continues to report decline in his overall health and needing more assistance in activities of daily living. He denied any recent back pain, bone pain or pathological fractures. He had multiple falls related to his instability. He is also reporting poor by mouth intake and mental decline.  He does not report any headaches, blurry vision, syncope or seizures. He does not report any fevers or chills or sweats. He does not report any cough or hemoptysis or hematemesis. Does not report any nausea, vomiting or abdominal pain. Does not report any constipation, diarrhea or hematochezia. He does not report any frequency urgency or hesitancy. He does not report any skeletal complaints. Remainder review of systems unremarkable.   Medications: I have reviewed the patient's current medications.  Current Outpatient Prescriptions  Medication Sig Dispense Refill  . albuterol (PROVENTIL HFA;VENTOLIN HFA) 108 (90 Base) MCG/ACT inhaler Inhale 2 puffs into the lungs every 6 (six) hours as needed for wheezing or shortness of breath. 1 Inhaler 0  . BD PEN NEEDLE NANO U/F 32G X 4 MM MISC See admin instructions.  4  . Carboxymethylcellul-Glycerin 0.5-0.9 % SOLN Apply 1 drop to eye daily as needed (dry eye).    Marland Kitchen dextromethorphan-guaiFENesin (MUCINEX DM) 30-600 MG 12hr tablet Take 1 tablet by mouth 2 (two) times daily. 60 tablet 0  . docusate sodium 100 MG CAPS Take 200 mg  by mouth 2 (two) times daily as needed for mild constipation. (Patient taking differently: Take 200 mg by mouth daily. ) 25 capsule 0  . esomeprazole (NEXIUM) 40 MG capsule Take 1 capsule (40 mg total) by mouth 2 (two) times daily before a meal. 60 capsule 1  . famotidine (PEPCID) 20 MG tablet Take 1 tablet (20 mg total) by mouth at bedtime. 30 tablet 1  . furosemide (LASIX) 40 MG tablet Take 20-40 mg by mouth daily. takes 40 mg on MWFSun and 20mg  on TThSat    . gabapentin (NEURONTIN) 100 MG capsule TAKE 2 CAPSULES BY MOUTH EVERY DAY AT BEDTIME  11  . glipiZIDE (GLUCOTROL XL) 10 MG 24 hr tablet Take 10 mg by mouth 2 (two) times daily.    Marland Kitchen guaiFENesin-dextromethorphan (ROBITUSSIN DM) 100-10 MG/5ML syrup Take 5 mLs by mouth every 4 (four) hours as needed for cough. 118 mL 0  . HUMALOG KWIKPEN 100 UNIT/ML KiwkPen Inject 2-7 Units into the skin 2 (two) times daily with a meal. 2 units if sugar level 150-200 and 7 units when above 200    . insulin detemir (LEVEMIR) 100 UNIT/ML injection Inject 0.1 mLs (10 Units total) into the skin daily at 10 pm. (Patient taking differently: Inject 20 Units into the skin 2 (two) times daily. ) 10 mL 11  . Loratadine (CLARITIN) 10 MG CAPS Take 1 capsule by mouth daily.    . memantine (NAMENDA) 10 MG tablet Take 1 tablet (10 mg total) by mouth 2 (two) times daily. 180 tablet 4  . metoprolol tartrate (LOPRESSOR)  25 MG tablet Take 12.5 mg by mouth 2 (two) times daily.     . Multiple Vitamins-Minerals (ELDERTONIC PO) Take 15 mLs by mouth 2 (two) times daily.    . polyethylene glycol (MIRALAX / GLYCOLAX) packet Take 17 g by mouth daily as needed for mild constipation. Takes on MW  6  . simvastatin (ZOCOR) 40 MG tablet Take 40 mg by mouth every morning.     . warfarin (COUMADIN) 5 MG tablet Take 7.5 mg by mouth at bedtime.   5   No current facility-administered medications for this visit.      Allergies:  Allergies  Allergen Reactions  . Donepezil Nausea And Vomiting   . Hydrocodone Nausea And Vomiting    Past Medical History, Surgical history, Social history, and Family History were reviewed and updated.  Physical Exam: Blood pressure 119/67, pulse 94, temperature 97.6 F (36.4 C), temperature source Oral, resp. rate 18, height 6\' 1"  (1.854 m), SpO2 100 %. ECOG: 2 General appearance: Elderly gentleman appeared without distress. Head: Normocephalic, without obvious abnormality no oral ulcers or lesions. Neck: no adenopathy Lymph nodes: Cervical, supraclavicular, and axillary nodes normal. Heart:regular rate and rhythm, S1, S2 normal, no murmur, click, rub or gallop Lung:chest clear, no wheezing, rales, normal symmetric air entry Abdomin: soft, non-tender, without masses or organomegaly. No rebound or guarding. EXT: Edema noted bilaterally.   Lab Results: Lab Results  Component Value Date   WBC 7.1 02/27/2017   HGB 14.0 02/27/2017   HCT 42.3 02/27/2017   MCV 82.6 02/27/2017   PLT 213 02/27/2017     Chemistry      Component Value Date/Time   NA 136 01/27/2017 0926   NA 144 07/24/2016 1503   K 4.1 01/27/2017 0926   K 4.3 07/24/2016 1503   CL 103 01/27/2017 0926   CO2 29 01/27/2017 0926   CO2 27 07/24/2016 1503   BUN 29 (H) 01/27/2017 0926   BUN 28.2 (H) 07/24/2016 1503   CREATININE 1.78 (H) 01/27/2017 0926   CREATININE 2.0 (H) 07/24/2016 1503      Component Value Date/Time   CALCIUM 9.1 01/27/2017 0926   CALCIUM 9.7 07/24/2016 1503   ALKPHOS 70 01/27/2017 0926   ALKPHOS 87 07/24/2016 1503   AST 21 01/27/2017 0926   AST 19 07/24/2016 1503   ALT 15 (L) 01/27/2017 0926   ALT 16 07/24/2016 1503   BILITOT 0.8 01/27/2017 0926   BILITOT 0.35 07/24/2016 1503     Results for [name], [name] (MRN [id number]) as of 07/31/2016 12:51  Ref. Range 11/17/2015 15:21 07/24/2016 15:03 07/24/2016 15:03  IgG (Immunoglobin G), Serum Latest Ref Range: 700 - 1600 mg/dL 1,524  1,639 (H)  IgM, Qn, Serum Latest Ref Range: 15 - 143 mg/dL 117  128  M  Protein SerPl Elph-Mcnc Latest Ref Range: Not Observed g/dL 0.6 (H)  0.8 (H)  Results for Marvin Bishop (MRN [id number]) as of 07/31/2016 12:51  Ref. Range 11/17/2015 15:21 07/24/2016 15:03 07/24/2016 15:03 07/24/2016 15:03  Creatinine Latest Ref Range: 0.7 - 1.3 mg/dL 1.5 (H)   2.1 (H)    83 year old gentleman with the following issues:  1. Monoclonal gammopathy with IgG kappa subtype diagnosed in July 2016  after presenting with increased kappa to lambda ratio of 2.68. These findings support the diagnosis of monoclonal gammopathy of undetermined significance rather than active myeloma. His protein studies have not dramatically changed in the last 2 years.  Given his age and overall mental decline, I  recommended no further surveillance from that standpoint.  2. Renal insufficiency: Likely unrelated to a plasma cell disorder and could be related to his long-standing diabetes.   3. Pulmonary embolism: Currently anticoagulated with warfarin.  4. Follow-up: I'm happy to see him in the future as needed.  Zola Button, MD 5/16/20183:30 PM

## 2017-02-27 NOTE — Addendum Note (Signed)
Addended by: Amelia Jo I on: 02/27/2017 03:54 PM   Modules accepted: Orders

## 2017-02-28 LAB — KAPPA/LAMBDA LIGHT CHAINS
Ig Kappa Free Light Chain: 106 mg/L — ABNORMAL HIGH (ref 3.3–19.4)
Ig Lambda Free Light Chain: 28.4 mg/L — ABNORMAL HIGH (ref 5.7–26.3)
KAPPA/LAMBDA FLC RATIO: 3.73 — AB (ref 0.26–1.65)

## 2017-03-04 LAB — MULTIPLE MYELOMA PANEL, SERUM
ALBUMIN SERPL ELPH-MCNC: 3.3 g/dL (ref 2.9–4.4)
Albumin/Glob SerPl: 0.9 (ref 0.7–1.7)
Alpha 1: 0.2 g/dL (ref 0.0–0.4)
Alpha2 Glob SerPl Elph-Mcnc: 0.9 g/dL (ref 0.4–1.0)
B-Globulin SerPl Elph-Mcnc: 1 g/dL (ref 0.7–1.3)
Gamma Glob SerPl Elph-Mcnc: 1.6 g/dL (ref 0.4–1.8)
Globulin, Total: 3.7 g/dL (ref 2.2–3.9)
IGM (IMMUNOGLOBIN M), SRM: 105 mg/dL (ref 15–143)
IgA, Qn, Serum: 220 mg/dL (ref 61–437)
M Protein SerPl Elph-Mcnc: 0.8 g/dL — ABNORMAL HIGH
TOTAL PROTEIN: 7 g/dL (ref 6.0–8.5)

## 2017-04-16 ENCOUNTER — Other Ambulatory Visit: Payer: Self-pay | Admitting: Neurology

## 2017-05-21 ENCOUNTER — Ambulatory Visit (INDEPENDENT_AMBULATORY_CARE_PROVIDER_SITE_OTHER): Payer: Medicare Other | Admitting: Podiatry

## 2017-05-21 DIAGNOSIS — B351 Tinea unguium: Secondary | ICD-10-CM

## 2017-05-21 DIAGNOSIS — M79676 Pain in unspecified toe(s): Secondary | ICD-10-CM | POA: Diagnosis not present

## 2017-05-21 DIAGNOSIS — E1151 Type 2 diabetes mellitus with diabetic peripheral angiopathy without gangrene: Secondary | ICD-10-CM

## 2017-05-21 NOTE — Progress Notes (Signed)
Patient ID: Marvin L Monforte Sr., male   DOB: 12/04/1919, 83 y.o.   MRN: [id number] HPI  Complaint:  Visit Type: Patient returns to my office for continued preventative foot care services. Complaint: Patient states" my nails have grown long and thick and become painful to walk and wear shoes" Patient has been diagnosed with DM . This patient  presents for preventative foot care services. No changes to ROS  Podiatric Exam: Vascular: dorsalis pedis and posterior tibial pulses are non-palpable. Capillary return is diminished. Cold feet noted.. Skin turgor WNL, bilateral swelling  Sensorium: Diminished  Semmes Weinstein monofilament test. Normal tactile sensation bilaterally.  Nail Exam: Pt has thick disfigured discolored nails with subungual debris noted bilateral entire nail hallux through fifth toenails Ulcer Exam: There is no evidence of ulcer or pre-ulcerative changes or infection. Orthopedic Exam: Muscle tone and strength are WNL. No limitations in general ROM. No crepitus or effusions noted. Foot type and digits show no abnormalities. Bony prominences are unremarkable. Skin: No Porokeratosis. No infection or ulcers  Diagnosis:  Onychomycosis, Pain in right toe, pain in left toes  Treatment & Plan Procedures and Treatment: Consent by patient was obtained for treatment procedures. The patient understood the discussion of treatment and procedures well. All questions were answered thoroughly reviewed. Debridement of mycotic and hypertrophic toenails, 1 through 5 bilateral and clearing of subungual debris. No ulceration, no infection noted.  Return Visit-Office Procedure: Patient instructed to return to the office for a follow up visit 3 months for continued evaluation and treatment.    Arel Tippen DPM  

## 2017-05-27 ENCOUNTER — Inpatient Hospital Stay (HOSPITAL_COMMUNITY)
Admission: EM | Admit: 2017-05-27 | Discharge: 2017-05-31 | DRG: 871 | Disposition: A | Discharge status: Skilled Nursing Facility | Payer: Medicare Other | Attending: Internal Medicine | Admitting: Internal Medicine

## 2017-05-27 ENCOUNTER — Emergency Department (HOSPITAL_COMMUNITY): Payer: Medicare Other

## 2017-05-27 ENCOUNTER — Encounter (HOSPITAL_COMMUNITY): Payer: Self-pay | Admitting: Emergency Medicine

## 2017-05-27 DIAGNOSIS — I639 Cerebral infarction, unspecified: Secondary | ICD-10-CM | POA: Diagnosis not present

## 2017-05-27 DIAGNOSIS — R531 Weakness: Secondary | ICD-10-CM | POA: Diagnosis not present

## 2017-05-27 DIAGNOSIS — R296 Repeated falls: Secondary | ICD-10-CM | POA: Diagnosis present

## 2017-05-27 DIAGNOSIS — B9620 Unspecified Escherichia coli [E. coli] as the cause of diseases classified elsewhere: Secondary | ICD-10-CM | POA: Diagnosis not present

## 2017-05-27 DIAGNOSIS — I10 Essential (primary) hypertension: Secondary | ICD-10-CM | POA: Diagnosis not present

## 2017-05-27 DIAGNOSIS — E1159 Type 2 diabetes mellitus with other circulatory complications: Secondary | ICD-10-CM | POA: Diagnosis present

## 2017-05-27 DIAGNOSIS — Z79899 Other long term (current) drug therapy: Secondary | ICD-10-CM

## 2017-05-27 DIAGNOSIS — Z87891 Personal history of nicotine dependence: Secondary | ICD-10-CM | POA: Diagnosis not present

## 2017-05-27 DIAGNOSIS — I130 Hypertensive heart and chronic kidney disease with heart failure and stage 1 through stage 4 chronic kidney disease, or unspecified chronic kidney disease: Secondary | ICD-10-CM | POA: Diagnosis not present

## 2017-05-27 DIAGNOSIS — M4712 Other spondylosis with myelopathy, cervical region: Secondary | ICD-10-CM | POA: Diagnosis present

## 2017-05-27 DIAGNOSIS — G459 Transient cerebral ischemic attack, unspecified: Secondary | ICD-10-CM

## 2017-05-27 DIAGNOSIS — N183 Chronic kidney disease, stage 3 unspecified: Secondary | ICD-10-CM | POA: Diagnosis present

## 2017-05-27 DIAGNOSIS — Z7901 Long term (current) use of anticoagulants: Secondary | ICD-10-CM | POA: Diagnosis not present

## 2017-05-27 DIAGNOSIS — A419 Sepsis, unspecified organism: Secondary | ICD-10-CM | POA: Diagnosis not present

## 2017-05-27 DIAGNOSIS — E876 Hypokalemia: Secondary | ICD-10-CM | POA: Diagnosis not present

## 2017-05-27 DIAGNOSIS — Z794 Long term (current) use of insulin: Secondary | ICD-10-CM | POA: Diagnosis not present

## 2017-05-27 DIAGNOSIS — I5030 Unspecified diastolic (congestive) heart failure: Secondary | ICD-10-CM | POA: Diagnosis not present

## 2017-05-27 DIAGNOSIS — I5042 Chronic combined systolic (congestive) and diastolic (congestive) heart failure: Secondary | ICD-10-CM | POA: Diagnosis not present

## 2017-05-27 DIAGNOSIS — E119 Type 2 diabetes mellitus without complications: Secondary | ICD-10-CM

## 2017-05-27 DIAGNOSIS — Z8546 Personal history of malignant neoplasm of prostate: Secondary | ICD-10-CM

## 2017-05-27 DIAGNOSIS — E7800 Pure hypercholesterolemia, unspecified: Secondary | ICD-10-CM | POA: Diagnosis present

## 2017-05-27 DIAGNOSIS — Z86711 Personal history of pulmonary embolism: Secondary | ICD-10-CM | POA: Diagnosis present

## 2017-05-27 DIAGNOSIS — F0390 Unspecified dementia without behavioral disturbance: Secondary | ICD-10-CM | POA: Diagnosis not present

## 2017-05-27 DIAGNOSIS — E860 Dehydration: Secondary | ICD-10-CM | POA: Diagnosis present

## 2017-05-27 DIAGNOSIS — E1122 Type 2 diabetes mellitus with diabetic chronic kidney disease: Secondary | ICD-10-CM | POA: Diagnosis present

## 2017-05-27 DIAGNOSIS — G839 Paralytic syndrome, unspecified: Secondary | ICD-10-CM | POA: Diagnosis not present

## 2017-05-27 DIAGNOSIS — Z8639 Personal history of other endocrine, nutritional and metabolic disease: Secondary | ICD-10-CM

## 2017-05-27 DIAGNOSIS — Z888 Allergy status to other drugs, medicaments and biological substances status: Secondary | ICD-10-CM | POA: Diagnosis not present

## 2017-05-27 DIAGNOSIS — N390 Urinary tract infection, site not specified: Secondary | ICD-10-CM | POA: Diagnosis present

## 2017-05-27 DIAGNOSIS — A4151 Sepsis due to Escherichia coli [E. coli]: Secondary | ICD-10-CM | POA: Diagnosis not present

## 2017-05-27 DIAGNOSIS — D472 Monoclonal gammopathy: Secondary | ICD-10-CM | POA: Diagnosis present

## 2017-05-27 DIAGNOSIS — G309 Alzheimer's disease, unspecified: Secondary | ICD-10-CM | POA: Diagnosis not present

## 2017-05-27 DIAGNOSIS — J189 Pneumonia, unspecified organism: Secondary | ICD-10-CM | POA: Diagnosis present

## 2017-05-27 DIAGNOSIS — F0280 Dementia in other diseases classified elsewhere without behavioral disturbance: Secondary | ICD-10-CM | POA: Diagnosis not present

## 2017-05-27 LAB — URINALYSIS, ROUTINE W REFLEX MICROSCOPIC
BILIRUBIN URINE: NEGATIVE
Glucose, UA: 50 mg/dL — AB
KETONES UR: NEGATIVE mg/dL
Nitrite: NEGATIVE
Protein, ur: NEGATIVE mg/dL
SQUAMOUS EPITHELIAL / LPF: NONE SEEN
Specific Gravity, Urine: 1.01 (ref 1.005–1.030)
pH: 5 (ref 5.0–8.0)

## 2017-05-27 LAB — CBC WITH DIFFERENTIAL/PLATELET
BASOS ABS: 0 10*3/uL (ref 0.0–0.1)
BASOS PCT: 0 %
EOS PCT: 0 %
Eosinophils Absolute: 0 10*3/uL (ref 0.0–0.7)
HEMATOCRIT: 39.3 % (ref 39.0–52.0)
Hemoglobin: 13.6 g/dL (ref 13.0–17.0)
Lymphocytes Relative: 6 %
Lymphs Abs: 0.8 10*3/uL (ref 0.7–4.0)
MCH: 27.8 pg (ref 26.0–34.0)
MCHC: 34.6 g/dL (ref 30.0–36.0)
MCV: 80.4 fL (ref 78.0–100.0)
MONO ABS: 2.1 10*3/uL — AB (ref 0.1–1.0)
MONOS PCT: 15 %
NEUTROS ABS: 11.5 10*3/uL — AB (ref 1.7–7.7)
Neutrophils Relative %: 79 %
PLATELETS: 217 10*3/uL (ref 150–400)
RBC: 4.89 MIL/uL (ref 4.22–5.81)
RDW: 14.9 % (ref 11.5–15.5)
WBC: 14.4 10*3/uL — ABNORMAL HIGH (ref 4.0–10.5)

## 2017-05-27 LAB — COMPREHENSIVE METABOLIC PANEL
ALK PHOS: 73 U/L (ref 38–126)
ALT: 17 U/L (ref 17–63)
AST: 21 U/L (ref 15–41)
Albumin: 3.6 g/dL (ref 3.5–5.0)
Anion gap: 11 (ref 5–15)
BILIRUBIN TOTAL: 0.9 mg/dL (ref 0.3–1.2)
BUN: 30 mg/dL — AB (ref 6–20)
CALCIUM: 9.2 mg/dL (ref 8.9–10.3)
CHLORIDE: 101 mmol/L (ref 101–111)
CO2: 27 mmol/L (ref 22–32)
CREATININE: 1.87 mg/dL — AB (ref 0.61–1.24)
GFR, EST AFRICAN AMERICAN: 34 mL/min — AB (ref >60)
GFR, EST NON AFRICAN AMERICAN: 29 mL/min — AB (ref >60)
Glucose, Bld: 290 mg/dL — ABNORMAL HIGH (ref 65–99)
Potassium: 3.7 mmol/L (ref 3.5–5.1)
Sodium: 139 mmol/L (ref 135–145)
TOTAL PROTEIN: 7.5 g/dL (ref 6.5–8.1)

## 2017-05-27 LAB — PROTIME-INR
INR: 2.33
Prothrombin Time: 26 seconds — ABNORMAL HIGH (ref 11.4–15.2)

## 2017-05-27 LAB — I-STAT TROPONIN, ED: TROPONIN I, POC: 0.04 ng/mL (ref 0.00–0.08)

## 2017-05-27 LAB — I-STAT CG4 LACTIC ACID, ED: LACTIC ACID, VENOUS: 2.37 mmol/L — AB (ref 0.5–1.9)

## 2017-05-27 MED ORDER — SODIUM CHLORIDE 0.9 % IV SOLN
1000.0000 mL | INTRAVENOUS | Status: DC
  Administered 2017-05-27 (×2): 1000 mL via INTRAVENOUS

## 2017-05-27 MED ORDER — SODIUM CHLORIDE 0.9 % IV BOLUS (SEPSIS)
1000.0000 mL | Freq: Once | INTRAVENOUS | Status: AC
  Administered 2017-05-27: 1000 mL via INTRAVENOUS

## 2017-05-27 MED ORDER — ACETAMINOPHEN 500 MG PO TABS
1000.0000 mg | ORAL_TABLET | Freq: Once | ORAL | Status: AC
  Administered 2017-05-27: 1000 mg via ORAL
  Filled 2017-05-27: qty 2

## 2017-05-27 MED ORDER — PIPERACILLIN-TAZOBACTAM 3.375 G IVPB 30 MIN
3.3750 g | Freq: Once | INTRAVENOUS | Status: AC
Start: 2017-05-27 — End: 2017-05-27
  Administered 2017-05-27: 3.375 g via INTRAVENOUS
  Filled 2017-05-27: qty 50

## 2017-05-27 MED ORDER — ACETAMINOPHEN 325 MG PO TABS: 650.0000 mg | ORAL_TABLET | Freq: Once | ORAL | Status: DC

## 2017-05-27 MED ORDER — VANCOMYCIN HCL IN DEXTROSE 1-5 GM/200ML-% IV SOLN
1000.0000 mg | Freq: Once | INTRAVENOUS | Status: AC
  Administered 2017-05-27: 1000 mg via INTRAVENOUS
  Filled 2017-05-27: qty 200

## 2017-05-27 NOTE — ED Notes (Signed)
Patient transported to X-ray 

## 2017-05-27 NOTE — ED Triage Notes (Signed)
Per family pt has been having increasing weakness over the last few days along with reports having dark colored urine. Pt family states he has been having difficulty walking. Pt has right sided weakness that started around 1030 today.

## 2017-05-27 NOTE — Progress Notes (Signed)
A consult was received from an ED physician for vanc and zosyn per pharmacy dosing.  The patient's profile has been reviewed for ht/wt/allergies/indication/available labs.   A one time order has been placed for vanc and zosyn.  Further antibiotics/pharmacy consults should be ordered by admitting physician if indicated.                       Thank you,  Dolly Rias RPh 05/27/2017, 9:06 PM Pager [phone]

## 2017-05-27 NOTE — ED Provider Notes (Signed)
[facility] DEPT Provider Note   CSN: [id number] Arrival date & time: 05/27/17  1953     History   Chief Complaint Chief Complaint  Patient presents with  . Weakness    HPI Marvin ASTORGA Sr. is a 83 y.o. male.  HPI Marvin KIDD Sr. is a 83 y.o. male with history of diabetes, dementia, hypertension, PE, anticoagulated, history of prostate cancer, presents to emergency department with generalized weakness. Patient lives alone but has a care provider that comes and visits him daily. He also has family that visits him on a daily basis. Family noticed that patient was having generalized weakness and increasing difficulty ambulating yesterday. Today he seemed to have some right-sided weakness that started around 10 AM. When symptoms were gradually getting worse throughout the day, feeling decided to bring patient for evaluation. Patient states he does not have any complaints. He has no pain. He states he just does not feel good.  Past Medical History:  Diagnosis Date  . Arthritis   . Asthma    hx of as child  . Complete loss of vision    right eye  . Dementia   . Diabetes mellitus   . Headache(784.0)    slight  . Hypercholesteremia   . Hypertension   . Insomnia    occasional  . Memory loss   . Nephrolithiasis    right UVJ stone  . Peripheral neuropathy    both legs  . PONV (postoperative nausea and vomiting)   . Prostate cancer [facility])     Patient Active Problem List   Diagnosis Date Noted  . Influenza 11/08/2016  . Chronic combined systolic and diastolic congestive heart failure ([place]) 11/08/2016  . History of pulmonary embolus (PE) 11/08/2016  . PE (pulmonary embolism) 04/07/2015  . Pulmonary embolism ([place]) 04/07/2015  . Elevated troponin 04/07/2015  . Cough   . Pulmonary emboli ([place])   . Sinus tachycardia 04/01/2015  . Acid reflux 03/07/2015  . Blind painful eye 01/07/2015  . Ileus ([place]) 08/23/2014  . Dementia without behavioral disturbance 09/02/2013  .  Renal calculus or stone 08/22/2013  . Hypokalemia 08/22/2013  . Hypertension 08/22/2013  . IDDM (insulin dependent diabetes mellitus) ([place]) 08/22/2013  . Dehydration 08/22/2013  . Memory loss 04/23/2013  . Insomnia   . Carpal tunnel syndrome   . Diverticulosis     Past Surgical History:  Procedure Laterality Date  . APPENDECTOMY    . Cornea removed Right 02-26-15  . CYSTOSCOPY W/ URETERAL STENT PLACEMENT Right 10/01/2013   Procedure: cystoscopy right retrograde holmium laser lithotripsy;  Surgeon: Franchot Gallo, MD;  Location: [facility];  Service: Urology;  Laterality: Right;  holmium laser application  . HEMORROIDECTOMY    . KNEE ARTHROSCOPY Right   . PARS PLANA VITRECTOMY  10/24/2011   Procedure: PARS PLANA VITRECTOMY WITH 23 GAUGE;  Surgeon: Adonis Brook, MD;  Location: [facility];  Service: Ophthalmology;  Laterality: Right;  . TONSILLECTOMY  as teen       Home Medications    Prior to Admission medications   Medication Sig Start Date End Date Taking? Authorizing Provider  BD PEN NEEDLE NANO U/F 32G X 4 MM MISC See admin instructions. 07/13/15   [provider]  Carboxymethylcellul-Glycerin 0.5-0.9 % SOLN Apply 1 drop to eye daily as needed (dry eye).    [provider]  docusate sodium 100 MG CAPS Take 200 mg by mouth 2 (two) times daily as needed for mild constipation. Patient taking differently: Take 200  mg by mouth daily.  08/25/14   Thurnell Lose, MD  esomeprazole (NEXIUM) 40 MG capsule Take 1 capsule (40 mg total) by mouth 2 (two) times daily before a meal. 04/04/15   Barton Dubois, MD  famotidine (PEPCID) 20 MG tablet Take 1 tablet (20 mg total) by mouth at bedtime. 04/04/15   Barton Dubois, MD  furosemide (LASIX) 40 MG tablet Take 20-40 mg by mouth daily. takes 40 mg on MWFSun and 20mg  on TThSat    [provider]  gabapentin (NEURONTIN) 100 MG capsule TAKE 2 CAPSULES BY MOUTH EVERY DAY AT BEDTIME 09/21/15   [provider]  glipiZIDE  (GLUCOTROL XL) 10 MG 24 hr tablet Take 10 mg by mouth 2 (two) times daily.    [provider]  HUMALOG KWIKPEN 100 UNIT/ML KiwkPen Inject 2-7 Units into the skin 2 (two) times daily with a meal. 2 units if sugar level 150-200 and 7 units when above 200 06/08/16   [provider]  insulin detemir (LEVEMIR) 100 UNIT/ML injection Inject 0.1 mLs (10 Units total) into the skin daily at 10 pm. Patient taking differently: Inject 20 Units into the skin 2 (two) times daily.  04/10/15   Elgergawy, Silver Huguenin, MD  Loratadine (CLARITIN) 10 MG CAPS Take 1 capsule by mouth daily.    [provider]  memantine (NAMENDA) 10 MG tablet Take 1 tablet (10 mg total) by mouth 2 (two) times daily. Please call [phone] to schedule yearly appt. 04/16/17   Marcial Pacas, MD  metoprolol tartrate (LOPRESSOR) 25 MG tablet Take 12.5 mg by mouth 2 (two) times daily.  06/23/15   [provider]  Multiple Vitamins-Minerals (ELDERTONIC PO) Take 15 mLs by mouth 2 (two) times daily.    [provider]  polyethylene glycol (MIRALAX / GLYCOLAX) packet Take 17 g by mouth daily as needed for mild constipation. Takes on [facility] 04/29/15   [provider]  rivaroxaban (XARELTO) 10 MG TABS tablet Take 10 mg by mouth daily.    [provider]  simvastatin (ZOCOR) 40 MG tablet Take 40 mg by mouth every morning.     [provider]  warfarin (COUMADIN) 5 MG tablet Take 7.5 mg by mouth at bedtime.  11/16/15   [provider]    Family History Family History  Problem Relation Age of Onset  . Cancer Mother        uterus  . Heart Problems Father   . Cancer Brother        stomach  . Cancer Brother        throat  . Colon cancer Brother     Social History Social History  Substance Use Topics  . Smoking status: Former Smoker    Packs/day: 0.25    Types: Cigarettes    Quit date: 10/15/1966  . Smokeless tobacco: Never Used  . Alcohol use 0.0 oz/week     Comment: occasional      Allergies   Donepezil and Hydrocodone   Review of Systems Review of Systems  Constitutional: Positive for chills, fatigue and fever.  Respiratory: Negative for cough, chest tightness and shortness of breath.   Cardiovascular: Negative for chest pain, palpitations and leg swelling.  Gastrointestinal: Negative for abdominal distention, abdominal pain, diarrhea, nausea and vomiting.  Genitourinary: Negative for dysuria, frequency, hematuria and urgency.  Musculoskeletal: Negative for arthralgias, myalgias, neck pain and neck stiffness.  Skin: Negative for rash.  Allergic/Immunologic: Negative for immunocompromised state.  Neurological: Positive for weakness. Negative  for dizziness, light-headedness, numbness and headaches.  All other systems reviewed and are negative.    Physical Exam Updated Vital Signs BP 132/85 (BP Location: Left Arm)   Pulse (!) 121   Temp (!) 102.5 F (39.2 C) (Rectal)   Resp 20   Ht 6' (1.829 m)   Wt 97.5 kg (215 lb)   SpO2 97%   BMI 29.16 kg/m   Physical Exam  Constitutional: He is oriented to person, place, and time. He appears well-developed and well-nourished. No distress.  HENT:  Head: Normocephalic and atraumatic.  Eyes: Conjunctivae are normal.  Right eye absent  Neck: Neck supple.  Cardiovascular: Regular rhythm and normal heart sounds.   Tachycardic  Pulmonary/Chest: Effort normal. No respiratory distress. He has no wheezes. He has rales.  Abdominal: Soft. Bowel sounds are normal. He exhibits no distension. There is no tenderness. There is no rebound.  Musculoskeletal: He exhibits no edema.  Neurological: He is alert and oriented to person, place, and time. He displays normal reflexes. No cranial nerve deficit. Coordination normal.  Right arm and leg strength decreased compared to the right. Grip, biceps, hip flexor strength is 3/5. 5 out of 5 left-sided strength.  Skin: Skin is warm and dry.  Nursing note and vitals  reviewed.    ED Treatments / Results  Labs (all labs ordered are listed, but only abnormal results are displayed) Labs Reviewed  COMPREHENSIVE METABOLIC PANEL - Abnormal; Notable for the following:       Result Value   Glucose, Bld 290 (*)    BUN 30 (*)    Creatinine, Ser 1.87 (*)    GFR calc non Af Amer 29 (*)    GFR calc Af Amer 34 (*)    All other components within normal limits  CBC WITH DIFFERENTIAL/PLATELET - Abnormal; Notable for the following:    WBC 14.4 (*)    Neutro Abs 11.5 (*)    Monocytes Absolute 2.1 (*)    All other components within normal limits  PROTIME-INR - Abnormal; Notable for the following:    Prothrombin Time 26.0 (*)    All other components within normal limits  URINALYSIS, ROUTINE W REFLEX MICROSCOPIC - Abnormal; Notable for the following:    APPearance HAZY (*)    Glucose, UA 50 (*)    Hgb urine dipstick MODERATE (*)    Leukocytes, UA SMALL (*)    Bacteria, UA RARE (*)    All other components within normal limits  I-STAT CG4 LACTIC ACID, ED - Abnormal; Notable for the following:    Lactic Acid, Venous 2.37 (*)    All other components within normal limits  CULTURE, BLOOD (ROUTINE X 2)  CULTURE, BLOOD (ROUTINE X 2)  URINE CULTURE  I-STAT TROPONIN, ED  I-STAT CG4 LACTIC ACID, ED    EKG  EKG Interpretation  Date/Time:  Monday May 27 2017 20:49:30 EDT Ventricular Rate:  124 PR Interval:    QRS Duration: 104 QT Interval:  318 QTC Calculation: 457 R Axis:   -32 Text Interpretation:  Sinus tachycardia Incomplete RBBB and LAFB Abnormal R-wave progression, early transition Confirmed by Lacretia Leigh ([id number]) on 05/27/2017 11:01:02 PM       Radiology Dg Chest 2 View  Result Date: 05/27/2017 CLINICAL DATA:  Increasing weakness EXAM: CHEST  2 VIEW COMPARISON:  01/27/2017 FINDINGS: Mild increased opacity at the right base. No pleural effusion. Stable cardiomediastinal silhouette with atherosclerosis. No pneumothorax. Degenerative changes of  the spine. IMPRESSION: Mild right basilar atelectasis or  infiltrate. Electronically Signed   By: Donavan Foil M.D.   On: 05/27/2017 21:40   Ct Head Wo Contrast  Result Date: 05/27/2017 CLINICAL DATA:  Acute onset of worsening generalized weakness and difficulty walking. Bilateral hand pain. Initial encounter. EXAM: CT HEAD WITHOUT CONTRAST TECHNIQUE: Contiguous axial images were obtained from the base of the skull through the vertex without intravenous contrast. COMPARISON:  CT of the head performed 01/27/2017 FINDINGS: Brain: No evidence of acute infarction, hemorrhage, hydrocephalus, extra-axial collection or mass lesion/mass effect. Prominence of the ventricles and sulci reflects moderate cortical volume loss. Cerebellar atrophy is noted. Diffuse periventricular and subcortical white matter change likely reflects small vessel ischemic microangiopathy. The brainstem and fourth ventricle are within normal limits. The basal ganglia are unremarkable in appearance. The cerebral hemispheres demonstrate grossly normal gray-white differentiation. No mass effect or midline shift is seen. Vascular: No hyperdense vessel or unexpected calcification. Skull: There is no evidence of fracture; visualized osseous structures are unremarkable in appearance. Sinuses/Orbits: Postoperative change is noted at the right optic globe. The visualized portions of the left orbit are unremarkable in appearance. The paranasal sinuses and mastoid air cells are well-aerated. Other: No significant soft tissue abnormalities are seen. IMPRESSION: 1. No acute intracranial pathology seen on CT. 2. Moderate cortical volume loss and diffuse small vessel ischemic microangiopathy. Electronically Signed   By: Garald Balding M.D.   On: 05/27/2017 22:04    Procedures Procedures (including critical care time)  Medications Ordered in ED Medications - No data to display   Initial Impression / Assessment and Plan / ED Course  I have reviewed the  triage vital signs and the nursing notes.  Pertinent labs & imaging results that were available during my care of the patient were reviewed by me and considered in my medical decision making (see chart for details).     9:00 PM Pt seen and examined. Patient with weakness since yesterday, today family noticed around 10 AM that patient had right-sided weakness in addition to generalized weakness. Gradually decreased appetite and activity during the day, difficulty ambulating, brought here for evaluation. Vital signs show heart rate of 140, temperature 102.5. Code sepsis called. Patient denies any complaints. He is alert and oriented. Code confirmed with family, patient is full code.  Sepsis - Repeat Assessment  Performed at:    10:28 PM   Vitals     Blood pressure 118/63, pulse 98, temperature 99.9 F (37.7 C), temperature source Rectal, resp. rate 18, height 6' (1.829 m), weight 97.5 kg (215 lb), SpO2 99 %.  Heart:     Regular rate and rhythm  Lungs:    CTA  Capillary Refill:   <2 sec  Peripheral Pulse:   Radial pulse palpable  Skin:     Normal Color  10:29 PM Vital signs improving. Pt states he is feeling well. Moving right side better, although still weak. CXR showing possible pneumonia. Will admit for further treatment. Discussed pt's results with his daughter over the phone.    Spoke with [facility], will admit. I spoke with neurology, they recommended transferring to [facility] because of right-sided weakness for stroke workup. Patient's right arm weakness and leg weakness is improving and seems. However he still continues to have some deficits. CT was negative.  Vitals:   05/27/17 2230 05/27/17 2330 05/28/17 0011 05/28/17 0030  BP: (!) 101/46 110/60 (!) 109/54 (!) 100/56  Pulse: 99 90 84 88  Resp: 15 12 18 14   Temp:  TempSrc:      SpO2: 96% 95% 96% 97%  Weight:      Height:        Final Clinical Impressions(s) / ED Diagnoses   Final diagnoses:  Sepsis, due to  unspecified organism [facility])  Community acquired pneumonia, unspecified laterality  Right sided weakness    New Prescriptions New Prescriptions   No medications on file     Jeannett Senior, PA-C 05/28/17 0130

## 2017-05-27 NOTE — ED Provider Notes (Signed)
Medical screening examination/treatment/procedure(s) were conducted as a shared visit with non-physician practitioner(s) and myself.  I personally evaluated the patient during the encounter.   EKG Interpretation  Date/Time:  Monday May 27 2017 20:49:30 EDT Ventricular Rate:  124 PR Interval:    QRS Duration: 104 QT Interval:  318 QTC Calculation: 457 R Axis:   -32 Text Interpretation:  Sinus tachycardia Incomplete RBBB and LAFB Abnormal R-wave progression, early transition Confirmed by Lacretia Leigh ([id number]) on 05/27/2017 11:01:62 PM     83 year old male here with increasing weakness as well as dark urine. He was febrile here. X-ray consistent with pneumonia. Started on IV antibiotics. Has secondary complaint of right-sided weakness which is been improving a negative head CT. Will be admitted to the medicine service   Lacretia Leigh, MD 05/27/17 2315

## 2017-05-27 NOTE — H&P (Signed)
Marvin Bishop ZJI:[id number] DOB: Jun 03, 1920 DOA: 05/27/2017     PCP: [name], MD   Outpatient Specialists: Oncology Dr. Alen Blew Patient coming from:    home Lives alone,   hip provider visits him daily Chief Complaint: Generalized fatigue dark colored urine worsening ability to ambulate right-sided weakness, fever   HPI: Marvin CLAYBURN Sr. is a 83 y.o. male with medical history significant of dementia, diabetes mellitus,  , hypertension, hyperlipidemia, history of PE on chronic anticoagulation history of prostate cancer    Presented with right-sided weakness started at 10:30 AM right of this he have had generalized weakness and overall feeling badly patient has dementia unable to provide detailed medical history seems that the symptoms have been getting gradually worse until has gotten severely worse at 10:30 AM. Today patient also developed a fever up to 102 He has had decreased by mouth intake patient have had falls in the past today he slid down from the chair.    Regarding pertinent Chronic problems: monoclonal gammopathy with IgG kappa subtype diagnosed in July 2016 History of PE currently on Xarelto changed 1 week ago prior to that he has been on coumadin History of diabetes mellitus currently on Levemir followed by endocrinology  IN ER:  Temp (24hrs), Avg:100.8 F (38.2 C), Min:99.9 F (37.7 C), Max:102.5 F (39.2 C)      on arrival  ED Triage Vitals  Enc Vitals Group     BP 05/27/17 2014 107/69     Pulse Rate 05/27/17 2014 (!) 140     Resp 05/27/17 2014 18     Temp 05/27/17 2014 100.1 F (37.8 C)     Temp Source 05/27/17 2014 Oral     SpO2 05/27/17 2014 96 %     Weight 05/27/17 2039 215 lb (97.5 kg)     Height 05/27/17 2039 6' (1.829 m)     Head Circumference --      Peak Flow --      Pain Score --      Pain Loc --      Pain Edu? --      Excl. in [place]? --     RR 15   96% HR 99 BP 101/46 LA 2.37 Trop 0.04 NA 1:30 9K3.7 CR 1.87 which is at baseline alb  3.6 WBC 14.4 Hg13.6 INR 2.33  CT head non-acute Chest x-ray showing mild bile basal atelectasis versus infiltrate Following Medications were ordered in ER: Medications  0.9 %  sodium chloride infusion (1,000 mLs Intravenous New Bag/Given 05/27/17 2155)  sodium chloride 0.9 % bolus 1,000 mL (0 mLs Intravenous Stopped 05/27/17 2135)    And  sodium chloride 0.9 % bolus 1,000 mL (0 mLs Intravenous Stopped 05/27/17 2238)    And  sodium chloride 0.9 % bolus 1,000 mL (0 mLs Intravenous Stopped 05/27/17 2150)  piperacillin-tazobactam (ZOSYN) IVPB 3.375 g (0 g Intravenous Stopped 05/27/17 2145)  vancomycin (VANCOCIN) IVPB 1000 mg/200 mL premix (0 mg Intravenous Stopped 05/27/17 2213)  acetaminophen (TYLENOL) tablet 1,000 mg (1,000 mg Oral Given 05/27/17 2113)     ER provider discussed case with:  Neurology on call Dr. Cheral Marker who recommends transfer to [facility] and TIA workup patient not a candidate for TPA given he presented outside the window  Hospitalist was called for admission for community-acquired pneumonia resulting in sepsis and TIA  Review of Systems:    Pertinent positives include:  Fevers, chills, fatigue,   Constitutional:  No weight loss, night sweats,weight  loss  HEENT:  No headaches, Difficulty swallowing,Tooth/dental problems,Sore throat,  No sneezing, itching, ear ache, nasal congestion, post nasal drip,  Cardio-vascular:  No chest pain, Orthopnea, PND, anasarca, dizziness, palpitations.no Bilateral lower extremity swelling  GI:  No heartburn, indigestion, abdominal pain, nausea, vomiting, diarrhea, change in bowel habits, loss of appetite, melena, blood in stool, hematemesis Resp:  no shortness of breath at rest. No dyspnea on exertion, No excess mucus, no productive cough, No non-productive cough, No coughing up of blood.No change in color of mucus.No wheezing. Skin:  no rash or lesions. No jaundice GU:  no dysuria, change in color of urine, no urgency or frequency. No  straining to urinate.  No flank pain.  Musculoskeletal:  No joint pain or no joint swelling. No decreased range of motion. No back pain.  Psych:  No change in mood or affect. No depression or anxiety. No memory loss.  Neuro: no localizing neurological complaints, no tingling, no weakness, no double vision, no gait abnormality, no slurred speech, no confusion  As per HPI otherwise 10 point review of systems negative.   Past Medical History: Past Medical History:  Diagnosis Date  . Arthritis   . Asthma    hx of as child  . Complete loss of vision    right eye  . Dementia   . Diabetes mellitus   . Headache(784.0)    slight  . Hypercholesteremia   . Hypertension   . Insomnia    occasional  . Memory loss   . Nephrolithiasis    right UVJ stone  . Peripheral neuropathy    both legs  . PONV (postoperative nausea and vomiting)   . Prostate cancer [facility])    Past Surgical History:  Procedure Laterality Date  . APPENDECTOMY    . Cornea removed Right 02-26-15  . CYSTOSCOPY W/ URETERAL STENT PLACEMENT Right 10/01/2013   Procedure: cystoscopy right retrograde holmium laser lithotripsy;  Surgeon: Franchot Gallo, MD;  Location: [facility];  Service: Urology;  Laterality: Right;  holmium laser application  . HEMORROIDECTOMY    . KNEE ARTHROSCOPY Right   . PARS PLANA VITRECTOMY  10/24/2011   Procedure: PARS PLANA VITRECTOMY WITH 23 GAUGE;  Surgeon: Adonis Brook, MD;  Location: [place];  Service: Ophthalmology;  Laterality: Right;  . TONSILLECTOMY  as teen     Social History:  Ambulatory  Cane  walker      reports that he quit smoking about 50 years ago. His smoking use included Cigarettes. He smoked 0.25 packs per day. He has never used smokeless tobacco. He reports that he drinks alcohol. He reports that he does not use drugs.  Allergies:   Allergies  Allergen Reactions  . Donepezil Nausea And Vomiting  . Hydrocodone Nausea And Vomiting       Family History:   Family History   Problem Relation Age of Onset  . Cancer Mother        uterus  . Heart Problems Father   . Cancer Brother        stomach  . Cancer Brother        throat  . Colon cancer Brother     Medications: Prior to Admission medications   Medication Sig Start Date End Date Taking? Authorizing Provider  BD PEN NEEDLE NANO U/F 32G X 4 MM MISC See admin instructions. 07/13/15  Yes [provider]  Carboxymethylcellul-Glycerin 0.5-0.9 % SOLN Apply 1 drop to eye daily after breakfast.    Yes [provider]  docusate sodium 100 MG CAPS Take 200 mg by mouth 2 (two) times daily as needed for mild constipation. Patient taking differently: Take 200 mg by mouth 3 (three) times a week.  08/25/14  Yes Thurnell Lose, MD  erythromycin ophthalmic ointment Place 1 application into the right eye at bedtime. 05/03/17  Yes [provider]  esomeprazole (NEXIUM) 40 MG capsule Take 1 capsule (40 mg total) by mouth 2 (two) times daily before a meal. 04/04/15  Yes Barton Dubois, MD  famotidine (PEPCID) 20 MG tablet Take 1 tablet (20 mg total) by mouth at bedtime. 04/04/15  Yes Barton Dubois, MD  furosemide (LASIX) 40 MG tablet Take 40 mg by mouth daily after breakfast.    Yes [provider]  gabapentin (NEURONTIN) 100 MG capsule TAKE 2 CAPSULES BY MOUTH EVERY DAY AT BEDTIME 09/21/15  Yes [provider]  glipiZIDE (GLUCOTROL XL) 10 MG 24 hr tablet Take 10 mg by mouth daily with breakfast.    Yes [provider]  HUMALOG KWIKPEN 100 UNIT/ML KiwkPen Inject 2-7 Units into the skin 2 (two) times daily with a meal. 2 units if sugar level 150-200 and 7 units when above 200 06/08/16  Yes [provider]  insulin detemir (LEVEMIR) 100 UNIT/ML injection Inject 0.1 mLs (10 Units total) into the skin daily at 10 pm. Patient taking differently: Inject 10 Units into the skin 2 (two) times daily.  04/10/15  Yes Elgergawy, Silver Huguenin, MD  Loratadine (CLARITIN) 10 MG CAPS Take  10 mg by mouth daily after breakfast.    Yes [provider]  memantine (NAMENDA) 10 MG tablet Take 1 tablet (10 mg total) by mouth 2 (two) times daily. Please call [phone] to schedule yearly appt. 04/16/17  Yes Marcial Pacas, MD  metoprolol tartrate (LOPRESSOR) 25 MG tablet Take 12.5 mg by mouth 2 (two) times daily.  06/23/15  Yes [provider]  Multiple Vitamins-Minerals (ELDERTONIC PO) Take 15 mLs by mouth 2 (two) times daily.   Yes [provider]  polyethylene glycol (MIRALAX / GLYCOLAX) packet Take 17 g by mouth daily as needed for mild constipation. Takes on [facility] 04/29/15  Yes [provider]  rivaroxaban (XARELTO) 10 MG TABS tablet Take 15 mg by mouth daily after breakfast.    Yes [provider]  simvastatin (ZOCOR) 40 MG tablet Take 40 mg by mouth every morning.    Yes [provider]    Physical Exam: Patient Vitals for the past 24 hrs:  BP Temp Temp src Pulse Resp SpO2 Height Weight  05/27/17 2230 (!) 101/46 - - 99 15 96 % - -  05/27/17 2215 (!) 116/56 - - 100 17 97 % - -  05/27/17 2201 - 99.9 F (37.7 C) Rectal - - - - -  05/27/17 2156 118/63 - - 98 18 99 % - -  05/27/17 2115 125/79 - - (!) 110 15 100 % - -  05/27/17 2052 132/85 (!) 102.5 F (39.2 C) Rectal (!) 121 20 97 % - -  05/27/17 2039 - - - - - - 6' (1.829 m) 97.5 kg (215 lb)  05/27/17 2014 107/69 100.1 F (37.8 C) Oral (!) 140 18 96 % - -    1. General:  in No Acute distress 2. Psychological: Alert but not Oriented 3. Head/ENT:     Dry Mucous Membranes  Head Non traumatic, neck supple                           Poor Dentition 4. SKIN:  decreased Skin turgor,  Skin clean Dry and intact no rash 5. Heart: Regular rate and rhythm no  Murmur, Rub or gallop 6. Lungs:   no wheezes some  crackles   7. Abdomen: Soft non-tender, Non distended 8. Lower extremities: no clubbing, cyanosis, or edema 9. Neurologically right-sided weakness noted cranial  nerves II through XII intact 10. MSK: Normal range of motion   body mass index is 29.16 kg/m.  Labs on Admission:   Labs on Admission: I have personally reviewed following labs and imaging studies  CBC:  Recent Labs Lab 05/27/17 2049  WBC 14.4*  NEUTROABS 11.5*  HGB 13.6  HCT 39.3  MCV 80.4  PLT 409   Basic Metabolic Panel:  Recent Labs Lab 05/27/17 2049  NA 139  K 3.7  CL 101  CO2 27  GLUCOSE 290*  BUN 30*  CREATININE 1.87*  CALCIUM 9.2   GFR: Estimated Creatinine Clearance: 28.6 mL/min (A) (by C-G formula based on SCr of 1.87 mg/dL (H)). Liver Function Tests:  Recent Labs Lab 05/27/17 2049  AST 21  ALT 17  ALKPHOS 73  BILITOT 0.9  PROT 7.5  ALBUMIN 3.6   No results for input(s): LIPASE, AMYLASE in the last 168 hours. No results for input(s): AMMONIA in the last 168 hours. Coagulation Profile:  Recent Labs Lab 05/27/17 2049  INR 2.33   Cardiac Enzymes: No results for input(s): CKTOTAL, CKMB, CKMBINDEX, TROPONINI in the last 168 hours. BNP (last 3 results) No results for input(s): PROBNP in the last 8760 hours. HbA1C: No results for input(s): HGBA1C in the last 72 hours. CBG: No results for input(s): GLUCAP in the last 168 hours. Lipid Profile: No results for input(s): CHOL, HDL, LDLCALC, TRIG, CHOLHDL, LDLDIRECT in the last 72 hours. Thyroid Function Tests: No results for input(s): TSH, T4TOTAL, FREET4, T3FREE, THYROIDAB in the last 72 hours. Anemia Panel: No results for input(s): VITAMINB12, FOLATE, FERRITIN, TIBC, IRON, RETICCTPCT in the last 72 hours. Urine analysis:    Component Value Date/Time   COLORURINE YELLOW 05/27/2017 2120   APPEARANCEUR HAZY (A) 05/27/2017 2120   LABSPEC 1.010 05/27/2017 2120   PHURINE 5.0 05/27/2017 2120   GLUCOSEU 50 (A) 05/27/2017 2120   HGBUR MODERATE (A) 05/27/2017 2120   BILIRUBINUR NEGATIVE 05/27/2017 2120   KETONESUR NEGATIVE 05/27/2017 2120   PROTEINUR NEGATIVE 05/27/2017 2120   UROBILINOGEN  1.0 06/24/2015 1940   NITRITE NEGATIVE 05/27/2017 2120   LEUKOCYTESUR SMALL (A) 05/27/2017 2120   Sepsis Labs: @LABRCNTIP (procalcitonin:4,lacticidven:4) )No results found for this or any previous visit (from the past 240 hour(s)).     UA   no evidence of UTI Hematuria present  Lab Results  Component Value Date   HGBA1C 7.3 (H) 08/23/2014    Estimated Creatinine Clearance: 28.6 mL/min (A) (by C-G formula based on SCr of 1.87 mg/dL (H)).  BNP (last 3 results) No results for input(s): PROBNP in the last 8760 hours.   ECG REPORT  Independently reviewed Rate:124  Rhythm: Incomplete RBBB ST&T Change: No acute ischemic changes  QTC 457  Filed Weights   05/27/17 2039  Weight: 97.5 kg (215 lb)     Cultures:    Component Value Date/Time   SDES BLOOD RAC 11/08/2016 1052   SPECREQUEST BOTTLES DRAWN AEROBIC AND ANAEROBIC 5 CC EA  11/08/2016 1052   CULT  11/08/2016 1052    NO GROWTH 5 DAYS Performed at [facility], [place] [address] [place], [place] [address]    REPTSTATUS 11/13/2016 FINAL 11/08/2016 1052     Radiological Exams on Admission: Dg Chest 2 View  Result Date: 05/27/2017 CLINICAL DATA:  Increasing weakness EXAM: CHEST  2 VIEW COMPARISON:  01/27/2017 FINDINGS: Mild increased opacity at the right base. No pleural effusion. Stable cardiomediastinal silhouette with atherosclerosis. No pneumothorax. Degenerative changes of the spine. IMPRESSION: Mild right basilar atelectasis or infiltrate. Electronically Signed   By: Donavan Foil M.D.   On: 05/27/2017 21:40   Ct Head Wo Contrast  Result Date: 05/27/2017 CLINICAL DATA:  Acute onset of worsening generalized weakness and difficulty walking. Bilateral hand pain. Initial encounter. EXAM: CT HEAD WITHOUT CONTRAST TECHNIQUE: Contiguous axial images were obtained from the base of the skull through the vertex without intravenous contrast. COMPARISON:  CT of the head performed 01/27/2017 FINDINGS: Brain: No evidence of acute  infarction, hemorrhage, hydrocephalus, extra-axial collection or mass lesion/mass effect. Prominence of the ventricles and sulci reflects moderate cortical volume loss. Cerebellar atrophy is noted. Diffuse periventricular and subcortical white matter change likely reflects small vessel ischemic microangiopathy. The brainstem and fourth ventricle are within normal limits. The basal ganglia are unremarkable in appearance. The cerebral hemispheres demonstrate grossly normal gray-white differentiation. No mass effect or midline shift is seen. Vascular: No hyperdense vessel or unexpected calcification. Skull: There is no evidence of fracture; visualized osseous structures are unremarkable in appearance. Sinuses/Orbits: Postoperative change is noted at the right optic globe. The visualized portions of the left orbit are unremarkable in appearance. The paranasal sinuses and mastoid air cells are well-aerated. Other: No significant soft tissue abnormalities are seen. IMPRESSION: 1. No acute intracranial pathology seen on CT. 2. Moderate cortical volume loss and diffuse small vessel ischemic microangiopathy. Electronically Signed   By: Garald Balding M.D.   On: 05/27/2017 22:04    Chart has been reviewed    Assessment/Plan   83 y.o. male with medical history significant of dementia, diabetes mellitus,  , hypertension, hyperlipidemia, history of PE on chronic anticoagulation history of prostate cancer  Admitted for community-acquired pneumonia resulting in sepsis and TIA  Present on Admission: . TIA (transient ischemic attack -  - will admit based on TIA/CVA protocol, await results of MRA/MRI, Carotid Doppler and Echo, obtain cardiac enzymes,  ECG,   Lipid panel, TSH. Order PT/OT evaluation. Will make sure patient is on antiplatelet agent.   Neurology consult.     CAP - admit per pneumonia protocol given sepsis cover broadly for now and await sputum and blood cultures . Chronic combined systolic and diastolic  congestive heart failure (HCC) - currently appears to be somewhat dehydrated very gently rehydrate . Dementia without behavioral disturbance - expect some sundowning while hospitalized . Dehydration rehydrate gently being mindful history of CHF . Hypertension permissive hypertension for today . Sepsis ([place]) most likely secondary to pneumonia continue IV antibiotics and serial lactic acid await results of blood cultures . History of pulmonary embolus (PE) - continue chronic anticoagulation Was on xarelto but given age and renal function will change to Eliquis per pharmacy  . CKD (chronic kidney disease), stage III- Creatinine currently at baseline    Other plan as per orders.  DVT prophylaxis:  Eliquis Code Status:  FULL CODE   as per  family   Family Communication:   Family not  at  Bedside  plan of  care was discussed with  Daughter, on the phone Disposition Plan:     likely will need placement for rehabilitation                                           Would benefit from PT/OT eval prior to DC  ordered                    Consults called: Neurology    Admission status:    inpatient      Level of care     tele        I have spent a total of 57 min on this admission  Keighan Amezcua 05/27/2017, 11:56 PM    [facility]  Pager [phone]   after 2 AM please page floor coverage PA If 7AM-7PM, please contact the day team taking care of the patient  [email or web address]  Password [email or web address]

## 2017-05-28 ENCOUNTER — Inpatient Hospital Stay (HOSPITAL_COMMUNITY): Payer: Medicare Other

## 2017-05-28 DIAGNOSIS — R531 Weakness: Secondary | ICD-10-CM

## 2017-05-28 DIAGNOSIS — I5030 Unspecified diastolic (congestive) heart failure: Secondary | ICD-10-CM

## 2017-05-28 DIAGNOSIS — F0280 Dementia in other diseases classified elsewhere without behavioral disturbance: Secondary | ICD-10-CM

## 2017-05-28 DIAGNOSIS — G309 Alzheimer's disease, unspecified: Secondary | ICD-10-CM

## 2017-05-28 DIAGNOSIS — I639 Cerebral infarction, unspecified: Secondary | ICD-10-CM

## 2017-05-28 LAB — GLUCOSE, CAPILLARY
GLUCOSE-CAPILLARY: 135 mg/dL — AB (ref 65–99)
GLUCOSE-CAPILLARY: 135 mg/dL — AB (ref 65–99)
GLUCOSE-CAPILLARY: 158 mg/dL — AB (ref 65–99)
GLUCOSE-CAPILLARY: 178 mg/dL — AB (ref 65–99)
Glucose-Capillary: 149 mg/dL — ABNORMAL HIGH (ref 65–99)
Glucose-Capillary: 185 mg/dL — ABNORMAL HIGH (ref 65–99)

## 2017-05-28 LAB — VAS US CAROTID
LCCADDIAS: -3 cm/s
LCCADSYS: -65 cm/s
LCCAPDIAS: 2 cm/s
LEFT VERTEBRAL DIAS: 9 cm/s
LICADDIAS: -17 cm/s
LICADSYS: -77 cm/s
LICAPDIAS: 11 cm/s
Left CCA prox sys: 105 cm/s
Left ICA prox sys: 48 cm/s
RCCAPDIAS: 4 cm/s
RCCAPSYS: 88 cm/s
RIGHT VERTEBRAL DIAS: 2 cm/s
Right cca dist sys: -75 cm/s

## 2017-05-28 LAB — I-STAT CG4 LACTIC ACID, ED: LACTIC ACID, VENOUS: 1.58 mmol/L (ref 0.5–1.9)

## 2017-05-28 LAB — ECHOCARDIOGRAM COMPLETE
Height: 75 in
Weight: 3502.4 oz

## 2017-05-28 LAB — STREP PNEUMONIAE URINARY ANTIGEN: STREP PNEUMO URINARY ANTIGEN: NEGATIVE

## 2017-05-28 MED ORDER — SODIUM CHLORIDE 0.9 % IV SOLN
1000.0000 mL | INTRAVENOUS | Status: DC
  Administered 2017-05-28: 1000 mL via INTRAVENOUS

## 2017-05-28 MED ORDER — SENNOSIDES-DOCUSATE SODIUM 8.6-50 MG PO TABS: 1.0000 | ORAL_TABLET | Freq: Every evening | ORAL | Status: DC | PRN

## 2017-05-28 MED ORDER — ACETAMINOPHEN 160 MG/5ML PO SOLN: 650.0000 mg | ORAL | Status: DC | PRN

## 2017-05-28 MED ORDER — SODIUM CHLORIDE 0.9 % IV SOLN
INTRAVENOUS | Status: DC
  Administered 2017-05-29: 06:00:00 via INTRAVENOUS

## 2017-05-28 MED ORDER — VANCOMYCIN HCL IN DEXTROSE 1-5 GM/200ML-% IV SOLN
1000.0000 mg | INTRAVENOUS | Status: DC
  Administered 2017-05-28 – 2017-05-29 (×2): 1000 mg via INTRAVENOUS
  Filled 2017-05-28 (×3): qty 200

## 2017-05-28 MED ORDER — POLYETHYLENE GLYCOL 3350 17 G PO PACK: 17.0000 g | PACK | Freq: Every day | ORAL | Status: DC | PRN

## 2017-05-28 MED ORDER — ACETAMINOPHEN 650 MG RE SUPP: 650.0000 mg | RECTAL | Status: DC | PRN

## 2017-05-28 MED ORDER — INSULIN ASPART 100 UNIT/ML ~~LOC~~ SOLN
0.0000 [IU] | SUBCUTANEOUS | Status: DC
  Administered 2017-05-28: 1 [IU] via SUBCUTANEOUS
  Administered 2017-05-28: 2 [IU] via SUBCUTANEOUS
  Administered 2017-05-28: 1 [IU] via SUBCUTANEOUS
  Administered 2017-05-28: 2 [IU] via SUBCUTANEOUS
  Administered 2017-05-28: 1 [IU] via SUBCUTANEOUS
  Administered 2017-05-29 (×2): 2 [IU] via SUBCUTANEOUS
  Administered 2017-05-29 – 2017-05-30 (×2): 1 [IU] via SUBCUTANEOUS
  Administered 2017-05-30: 2 [IU] via SUBCUTANEOUS
  Administered 2017-05-30: 3 [IU] via SUBCUTANEOUS
  Administered 2017-05-31: 5 [IU] via SUBCUTANEOUS

## 2017-05-28 MED ORDER — GABAPENTIN 100 MG PO CAPS
200.0000 mg | ORAL_CAPSULE | Freq: Every day | ORAL | Status: DC
  Administered 2017-05-28 – 2017-05-30 (×3): 200 mg via ORAL
  Filled 2017-05-28 (×3): qty 2

## 2017-05-28 MED ORDER — ASPIRIN 325 MG PO TABS
325.0000 mg | ORAL_TABLET | Freq: Every day | ORAL | Status: DC
  Administered 2017-05-28: 325 mg via ORAL
  Filled 2017-05-28: qty 1

## 2017-05-28 MED ORDER — PANTOPRAZOLE SODIUM 40 MG PO TBEC
40.0000 mg | DELAYED_RELEASE_TABLET | Freq: Every day | ORAL | Status: DC
  Administered 2017-05-28 – 2017-05-31 (×4): 40 mg via ORAL
  Filled 2017-05-28 (×4): qty 1

## 2017-05-28 MED ORDER — PIPERACILLIN-TAZOBACTAM 3.375 G IVPB
3.3750 g | Freq: Three times a day (TID) | INTRAVENOUS | Status: DC
  Administered 2017-05-28 – 2017-05-31 (×10): 3.375 g via INTRAVENOUS
  Filled 2017-05-28 (×11): qty 50

## 2017-05-28 MED ORDER — APIXABAN 5 MG PO TABS
5.0000 mg | ORAL_TABLET | Freq: Two times a day (BID) | ORAL | Status: DC
  Administered 2017-05-28 – 2017-05-31 (×6): 5 mg via ORAL
  Filled 2017-05-28 (×7): qty 1

## 2017-05-28 MED ORDER — INSULIN DETEMIR 100 UNIT/ML ~~LOC~~ SOLN
7.0000 [IU] | Freq: Every day | SUBCUTANEOUS | Status: DC
  Administered 2017-05-28 – 2017-05-30 (×3): 7 [IU] via SUBCUTANEOUS
  Filled 2017-05-28 (×4): qty 0.07

## 2017-05-28 MED ORDER — STROKE: EARLY STAGES OF RECOVERY BOOK
Freq: Once | Status: AC
  Administered 2017-05-28: 1
  Filled 2017-05-28 (×2): qty 1

## 2017-05-28 MED ORDER — ASPIRIN 300 MG RE SUPP: 300.0000 mg | Freq: Every day | RECTAL | Status: DC

## 2017-05-28 MED ORDER — SIMVASTATIN 40 MG PO TABS
40.0000 mg | ORAL_TABLET | Freq: Every morning | ORAL | Status: DC
  Administered 2017-05-28 – 2017-05-31 (×4): 40 mg via ORAL
  Filled 2017-05-28 (×4): qty 1

## 2017-05-28 MED ORDER — ACETAMINOPHEN 325 MG PO TABS
650.0000 mg | ORAL_TABLET | ORAL | Status: DC | PRN
  Administered 2017-05-28 – 2017-05-31 (×3): 650 mg via ORAL
  Filled 2017-05-28 (×3): qty 2

## 2017-05-28 MED ORDER — MEMANTINE HCL 10 MG PO TABS
10.0000 mg | ORAL_TABLET | Freq: Two times a day (BID) | ORAL | Status: DC
  Administered 2017-05-28 – 2017-05-31 (×8): 10 mg via ORAL
  Filled 2017-05-28: qty 2
  Filled 2017-05-28 (×5): qty 1
  Filled 2017-05-28: qty 2
  Filled 2017-05-28 (×2): qty 1
  Filled 2017-05-28 (×2): qty 2
  Filled 2017-05-28: qty 1
  Filled 2017-05-28 (×2): qty 2
  Filled 2017-05-28: qty 1
  Filled 2017-05-28: qty 2

## 2017-05-28 NOTE — Progress Notes (Signed)
[facility]  PROGRESS NOTE  Marvin Bishop TWS:[id number] DOB: 1920-01-28 DOA: 05/27/2017 PCP: [name], MD   Brief HPI:    83 y.o. male with medical history significant of dementia, diabetes mellitus,  , hypertension, hyperlipidemia, history of PE on chronic anticoagulation history of prostate cancer    Presented with right-sided weakness started at 10:30 AM right of this he have had generalized weakness and overall feeling badly patient has dementia unable to provide detailed medical history seems that the symptoms have been getting gradually worse until has gotten severely worse at 10:30 AM. Today patient also developed a fever up to 102 He has had decreased by mouth intake patient have had falls in the past today he slid down from the chair.    Regarding pertinent Chronic problems: monoclonal gammopathy with IgG kappa subtype diagnosed in July 2016 History of PE currently on Xarelto changed 1 week ago prior to that he has been on coumadin History of diabetes mellitus currently on Levemir followed by endocrinology   Subjective   Patient seen and examined, complains of right shoulder pain. Denies chest pain or shortness of breath. MRI brain shows no stroke.   Assessment/Plan:     1. Right-sided weakness- MRI brain is negative for stroke. MRA brain shows no high-grade stenosis. 3 mm saccular aneurysm noted. Neurology is following and has ordered MRI cervical spine to rule out cervical hemicord lesion. We'll discontinue aspirin. 2. History of pulmonary embolism-continue apixaban 3. Community-acquired pneumonia- patient was initially started on vancomycin and Zosyn, lactate was elevated 2.37 and improved to 1.58. Blood cultures 2 remain negative. Continue these antibiotics for 1 more day. If cultures remain negative consider switching to Levaquin. 4. Sepsis- resolved, lactic acid is down to 1.58, likely from pneumonia or UTI. Follow urine culture results. 5. Dementia  without behavioral disturbance- stable 6. Hypertension- patient blood pressure is running soft, metoprolol, Lasix currently on hold. Consider restarting his medications if patient blood pressure improves 7. Diabetes mellitus-blood glucose is well controlled, 178, 135, 149. Continue sliding scale insulin with NovoLog. Levemir 7 units subcutaneous daily. 8. Chronic systolic and diastolic CHF- Lasix on hold, patient started on gentle IV hydration with normal saline. Follow BMP in a.m. Monitor strict intake and output.     DVT prophylaxis: Apixaban  Code Status: Full code  Family Communication: Discussed with patient's daughter on phone  Disposition Plan: Skilled nursing facility once all the workup for right-sided weakness   Consultants:  Neurology  Procedures:  Echocardiogram  Bilateral carotid duplex  Continuous infusions . sodium chloride 1,000 mL (05/28/17 0511)  . sodium chloride Stopped (05/28/17 0358)  . piperacillin-tazobactam (ZOSYN)  IV 3.375 g (05/28/17 1313)  . vancomycin        Antibiotics:   Anti-infectives    Start     Dose/Rate Route Frequency Ordered Stop   05/28/17 2000  vancomycin (VANCOCIN) IVPB 1000 mg/200 mL premix     1,000 mg 200 mL/hr over 60 Minutes Intravenous Every 24 hours 05/28/17 0252     05/28/17 0600  piperacillin-tazobactam (ZOSYN) IVPB 3.375 g     3.375 g 12.5 mL/hr over 240 Minutes Intravenous Every 8 hours 05/28/17 0252     05/27/17 2115  piperacillin-tazobactam (ZOSYN) IVPB 3.375 g     3.375 g 100 mL/hr over 30 Minutes Intravenous  Once 05/27/17 2102 05/27/17 2145   05/27/17 2115  vancomycin (VANCOCIN) IVPB 1000 mg/200 mL premix     1,000 mg 200 mL/hr over 60 Minutes Intravenous  Once 05/27/17 2102 05/27/17 2213       Objective   Vitals:   05/28/17 0700 05/28/17 0904 05/28/17 1110 05/28/17 1315  BP: 104/62 117/62 90/65 91/76   Pulse: 71 83 80 89  Resp: 15 18 18 18   Temp: 98.5 F (36.9 C) 97.9 F (36.6 C) 98.2 F (36.8  C) 97.9 F (36.6 C)  TempSrc: Oral Oral Oral Oral  SpO2: 100% 100%  99%  Weight:      Height:        Intake/Output Summary (Last 24 hours) at 05/28/17 1459 Last data filed at 05/28/17 1326  Gross per 24 hour  Intake           548.33 ml  Output                0 ml  Net           548.33 ml   Filed Weights   05/27/17 2039 05/28/17 0239  Weight: 97.5 kg (215 lb) 99.3 kg (218 lb 14.4 oz)     Physical Examination:   Physical Exam: Eyes: No icterus, extraocular muscles intact  Mouth: Oral mucosa is moist, no lesions on palate,  Neck: Supple, no deformities, masses, or tenderness Lungs: Normal respiratory effort, bilateral clear to auscultation, no crackles or wheezes.  Heart: Regular rate and rhythm, S1 and S2 normal, no murmurs, rubs auscultated Abdomen: BS normoactive,soft,nondistended,non-tender to palpation,no organomegaly Extremities: No pretibial edema, no erythema, no cyanosis, no clubbing Neuro : Alert and  not oriented to time, place and person, mild right upper and lower extremity weakness  Skin: No rashes seen on exam     Data Reviewed: I have personally reviewed following labs and imaging studies  CBG:  Recent Labs Lab 05/28/17 0455 05/28/17 0820 05/28/17 1110  GLUCAP 149* 135* 178*    CBC:  Recent Labs Lab 05/27/17 2049  WBC 14.4*  NEUTROABS 11.5*  HGB 13.6  HCT 39.3  MCV 80.4  PLT 417    Basic Metabolic Panel:  Recent Labs Lab 05/27/17 2049  NA 139  K 3.7  CL 101  CO2 27  GLUCOSE 290*  BUN 30*  CREATININE 1.87*  CALCIUM 9.2    Recent Results (from the past 240 hour(s))  Culture, blood (Routine x 2)     Status: None (Preliminary result)   Collection Time: 05/27/17  8:51 PM  Result Value Ref Range Status   Specimen Description BLOOD RIGHT HAND  Final   Special Requests   Final    BOTTLES DRAWN AEROBIC AND ANAEROBIC Blood Culture adequate volume   Culture   Final    NO GROWTH < 12 HOURS Performed at [facility],  [address], [place], [place] [address]    Report Status PENDING  Incomplete  Culture, blood (Routine x 2)     Status: None (Preliminary result)   Collection Time: 05/27/17  9:06 PM  Result Value Ref Range Status   Specimen Description BLOOD LEFT ARM  Final   Special Requests   Final    BOTTLES DRAWN AEROBIC AND ANAEROBIC Blood Culture adequate volume   Culture   Final    NO GROWTH < 12 HOURS Performed at [facility], [place] [address], [place], [place] [address]    Report Status PENDING  Incomplete     Liver Function Tests:  Recent Labs Lab 05/27/17 2049  AST 21  ALT 17  ALKPHOS 73  BILITOT 0.9  PROT 7.5  ALBUMIN 3.6  No results for input(s): LIPASE, AMYLASE in the last 168 hours. No results for input(s): AMMONIA in the last 168 hours.  Cardiac Enzymes: No results for input(s): CKTOTAL, CKMB, CKMBINDEX, TROPONINI in the last 168 hours. BNP (last 3 results)  Recent Labs  11/08/16 0630  BNP 28.4    ProBNP (last 3 results) No results for input(s): PROBNP in the last 8760 hours.    Studies: Dg Chest 2 View  Result Date: 05/27/2017 CLINICAL DATA:  Increasing weakness EXAM: CHEST  2 VIEW COMPARISON:  01/27/2017 FINDINGS: Mild increased opacity at the right base. No pleural effusion. Stable cardiomediastinal silhouette with atherosclerosis. No pneumothorax. Degenerative changes of the spine. IMPRESSION: Mild right basilar atelectasis or infiltrate. Electronically Signed   By: Donavan Foil M.D.   On: 05/27/2017 21:40   Ct Head Wo Contrast  Result Date: 05/27/2017 CLINICAL DATA:  Acute onset of worsening generalized weakness and difficulty walking. Bilateral hand pain. Initial encounter. EXAM: CT HEAD WITHOUT CONTRAST TECHNIQUE: Contiguous axial images were obtained from the base of the skull through the vertex without intravenous contrast. COMPARISON:  CT of the head performed 01/27/2017 FINDINGS: Brain: No evidence of acute infarction, hemorrhage, hydrocephalus,  extra-axial collection or mass lesion/mass effect. Prominence of the ventricles and sulci reflects moderate cortical volume loss. Cerebellar atrophy is noted. Diffuse periventricular and subcortical white matter change likely reflects small vessel ischemic microangiopathy. The brainstem and fourth ventricle are within normal limits. The basal ganglia are unremarkable in appearance. The cerebral hemispheres demonstrate grossly normal gray-white differentiation. No mass effect or midline shift is seen. Vascular: No hyperdense vessel or unexpected calcification. Skull: There is no evidence of fracture; visualized osseous structures are unremarkable in appearance. Sinuses/Orbits: Postoperative change is noted at the right optic globe. The visualized portions of the left orbit are unremarkable in appearance. The paranasal sinuses and mastoid air cells are well-aerated. Other: No significant soft tissue abnormalities are seen. IMPRESSION: 1. No acute intracranial pathology seen on CT. 2. Moderate cortical volume loss and diffuse small vessel ischemic microangiopathy. Electronically Signed   By: Garald Balding M.D.   On: 05/27/2017 22:04   Mr Brain Wo Contrast  Result Date: 05/28/2017 CLINICAL DATA:  83 y/o M; right-sided weakness and fever. TIA, initial exam. EXAM: MRI HEAD WITHOUT CONTRAST MRA HEAD WITHOUT CONTRAST TECHNIQUE: Multiplanar, multiecho pulse sequences of the brain and surrounding structures were obtained without intravenous contrast. Angiographic images of the head were obtained using MRA technique without contrast. COMPARISON:  05/27/2017 CT head. FINDINGS: MRI HEAD FINDINGS Brain: No acute infarction, hemorrhage, hydrocephalus, extra-axial collection or mass lesion. Patchy nonspecific foci of T2 FLAIR hyperintense signal abnormality in periventricular white matter is compatible with moderate chronic microvascular ischemic changes for age. Advanced brain parenchymal volume loss. Vascular: As below.  Skull and upper cervical spine: Normal marrow signal. Sinuses/Orbits: Negative. Other: None. MRA HEAD FINDINGS Motion degraded study, suboptimal evaluation for subtle stenosis or small aneurysm. Anterior circulation: No large vessel occlusion or high-grade stenosis. 3 mm superiorly directed outpouching of the right cavernous ICA probably representing a small saccular aneurysm (series 3, image 118). No additional aneurysm identified. Posterior circulation left dominant vertebrobasilar system. No large vessel occlusion or high-grade stenosis. Anatomic variant: Anterior communicating artery and right posterior communicating arteries are present. No left posterior communicating artery identified, likely hypoplastic or absent. IMPRESSION: MRI head: 1. No acute intracranial abnormality identified. 2. Moderate for age chronic microvascular ischemic changes and advanced parenchymal volume loss of the brain. MRI head: 1. Motion degraded study,  suboptimal evaluation for subtle stenosis or small aneurysm. 2. Patent circle of Willis. No large vessel occlusion or high-grade stenosis. 3. 3 mm outpouching superiorly directed from right cavernous ICA, probably a small saccular aneurysm. Electronically Signed   By: Kristine Garbe M.D.   On: 05/28/2017 05:09   Mr Jodene Nam Head Wo Contrast  Result Date: 05/28/2017 CLINICAL DATA:  83 y/o M; right-sided weakness and fever. TIA, initial exam. EXAM: MRI HEAD WITHOUT CONTRAST MRA HEAD WITHOUT CONTRAST TECHNIQUE: Multiplanar, multiecho pulse sequences of the brain and surrounding structures were obtained without intravenous contrast. Angiographic images of the head were obtained using MRA technique without contrast. COMPARISON:  05/27/2017 CT head. FINDINGS: MRI HEAD FINDINGS Brain: No acute infarction, hemorrhage, hydrocephalus, extra-axial collection or mass lesion. Patchy nonspecific foci of T2 FLAIR hyperintense signal abnormality in periventricular white matter is compatible  with moderate chronic microvascular ischemic changes for age. Advanced brain parenchymal volume loss. Vascular: As below. Skull and upper cervical spine: Normal marrow signal. Sinuses/Orbits: Negative. Other: None. MRA HEAD FINDINGS Motion degraded study, suboptimal evaluation for subtle stenosis or small aneurysm. Anterior circulation: No large vessel occlusion or high-grade stenosis. 3 mm superiorly directed outpouching of the right cavernous ICA probably representing a small saccular aneurysm (series 3, image 118). No additional aneurysm identified. Posterior circulation left dominant vertebrobasilar system. No large vessel occlusion or high-grade stenosis. Anatomic variant: Anterior communicating artery and right posterior communicating arteries are present. No left posterior communicating artery identified, likely hypoplastic or absent. IMPRESSION: MRI head: 1. No acute intracranial abnormality identified. 2. Moderate for age chronic microvascular ischemic changes and advanced parenchymal volume loss of the brain. MRI head: 1. Motion degraded study, suboptimal evaluation for subtle stenosis or small aneurysm. 2. Patent circle of Willis. No large vessel occlusion or high-grade stenosis. 3. 3 mm outpouching superiorly directed from right cavernous ICA, probably a small saccular aneurysm. Electronically Signed   By: Kristine Garbe M.D.   On: 05/28/2017 05:09    Scheduled Meds: . apixaban  5 mg Oral BID  . aspirin  300 mg Rectal Daily   Or  . aspirin  325 mg Oral Daily  . gabapentin  200 mg Oral QHS  . insulin aspart  0-9 Units Subcutaneous Q4H  . insulin detemir  7 Units Subcutaneous QHS  . memantine  10 mg Oral BID  . pantoprazole  40 mg Oral Daily  . simvastatin  40 mg Oral q morning - 10a      Time spent: 25 min  [facility] Pager [phone]. If 7PM-7AM, please contact night-coverage at [email or web address], Office  [phone]  password [email or web address]  05/28/2017, 2:59  PM  LOS: 1 day

## 2017-05-28 NOTE — Progress Notes (Signed)
Patient returned from MRI around 0500.

## 2017-05-28 NOTE — Evaluation (Signed)
Speech Language Pathology Evaluation Patient Details Name: Marvin CADDELL Sr. MRN: [id number] DOB: 11-15-1919 Today's Date: 05/28/2017 Time: 4098-1191 SLP Time Calculation (min) (ACUTE ONLY): 15 min  Problem List:  Patient Active Problem List   Diagnosis Date Noted  . TIA (transient ischemic attack) 05/27/2017  . Sepsis ([place]) 05/27/2017  . CKD (chronic kidney disease), stage III 05/27/2017  . Influenza 11/08/2016  . Chronic combined systolic and diastolic congestive heart failure ([place]) 11/08/2016  . History of pulmonary embolus (PE) 11/08/2016  . PE (pulmonary embolism) 04/07/2015  . Pulmonary embolism ([place]) 04/07/2015  . Elevated troponin 04/07/2015  . Cough   . Pulmonary emboli ([place])   . Sinus tachycardia 04/01/2015  . Acid reflux 03/07/2015  . Blind painful eye 01/07/2015  . Ileus ([place]) 08/23/2014  . Dementia without behavioral disturbance 09/02/2013  . Renal calculus or stone 08/22/2013  . Hypokalemia 08/22/2013  . Hypertension 08/22/2013  . IDDM (insulin dependent diabetes mellitus) ([place]) 08/22/2013  . Dehydration 08/22/2013  . Memory loss 04/23/2013  . Insomnia   . Carpal tunnel syndrome   . Diverticulosis    Past Medical History:  Past Medical History:  Diagnosis Date  . Arthritis   . Asthma    hx of as child  . Complete loss of vision    right eye  . Dementia   . Diabetes mellitus   . Headache(784.0)    slight  . Hypercholesteremia   . Hypertension   . Insomnia    occasional  . Memory loss   . Nephrolithiasis    right UVJ stone  . Peripheral neuropathy    both legs  . PONV (postoperative nausea and vomiting)   . Prostate cancer [facility])    Past Surgical History:  Past Surgical History:  Procedure Laterality Date  . APPENDECTOMY    . Cornea removed Right 02-26-15  . CYSTOSCOPY W/ URETERAL STENT PLACEMENT Right 10/01/2013   Procedure: cystoscopy right retrograde holmium laser lithotripsy;  Surgeon: Franchot Gallo, MD;  Location: [facility];  Service:  Urology;  Laterality: Right;  holmium laser application  . HEMORROIDECTOMY    . KNEE ARTHROSCOPY Right   . PARS PLANA VITRECTOMY  10/24/2011   Procedure: PARS PLANA VITRECTOMY WITH 23 GAUGE;  Surgeon: Adonis Brook, MD;  Location: [facility];  Service: Ophthalmology;  Laterality: Right;  . TONSILLECTOMY  as teen   HPI:  Pt is a 83 y.o. male with medical history significant of dementia, diabetes, mellitus, hypertension, hyperlipidemia, history of prostate cancer, and history of PE on chronic anticoagulation. He presented to the ED with R sided weakness and fever. MRI negative for acute infarct.   Assessment / Plan / Recommendation Clinical Impression  Pt is oriented to person only and has difficulty sustaining his attention to tasks. He tries to deflect questioning to others in the room, and his short-term and working memory is significantly impaired. Per his caregiver present he is at his baseline level of cognitive function, and his MRI was negative. SLP will sign off acutely.    SLP Assessment  SLP Recommendation/Assessment: Patient does not need any further Speech Lanaguage Pathology Services SLP Visit Diagnosis: Cognitive communication deficit (R41.841)    Follow Up Recommendations  24 hour supervision/assistance    Frequency and Duration           SLP Evaluation Cognition  Overall Cognitive Status: History of cognitive impairments - at baseline Orientation Level: Oriented to person;Oriented to place;Disoriented to time;Disoriented to situation       Comprehension  Auditory Comprehension Overall Auditory Comprehension: Impaired at baseline    Expression Expression Primary Mode of Expression: Verbal Verbal Expression Overall Verbal Expression: Appears within functional limits for tasks assessed Written Expression Dominant Hand: Left   Oral / Motor  Motor Speech Overall Motor Speech: Appears within functional limits for tasks assessed   GO                    Germain Osgood 05/28/2017, 3:18 PM  Germain Osgood, M.A. CCC-SLP [phone]

## 2017-05-28 NOTE — NC FL2 (Signed)
[place] LEVEL OF CARE SCREENING TOOL     IDENTIFICATION  Patient Name: Marvin ROBLERO Sr. Birthdate: 04/08/1920 Sex: male Admission Date (Current Location): 05/27/2017  [facility] and [place] Number:  Herbalist and Address:  [facility] [place], [place] [address] [place], [place] [address]      Provider Number: [id number]  Attending Physician Name and Address:  Oswald Hillock, MD  Relative Name and Phone Number:       Current Level of Care: Hospital Recommended Level of Care: [place] Prior Approval Number:    Date Approved/Denied:   PASRR Number: [id number]  Discharge Plan: SNF    Current Diagnoses: Patient Active Problem List   Diagnosis Date Noted  . TIA (transient ischemic attack) 05/27/2017  . Sepsis ([place]) 05/27/2017  . CKD (chronic kidney disease), stage III 05/27/2017  . Influenza 11/08/2016  . Chronic combined systolic and diastolic congestive heart failure ([place]) 11/08/2016  . History of pulmonary embolus (PE) 11/08/2016  . PE (pulmonary embolism) 04/07/2015  . Pulmonary embolism ([place]) 04/07/2015  . Elevated troponin 04/07/2015  . Cough   . Pulmonary emboli ([place])   . Sinus tachycardia 04/01/2015  . Acid reflux 03/07/2015  . Blind painful eye 01/07/2015  . Ileus ([place]) 08/23/2014  . Dementia without behavioral disturbance 09/02/2013  . Renal calculus or stone 08/22/2013  . Hypokalemia 08/22/2013  . Hypertension 08/22/2013  . IDDM (insulin dependent diabetes mellitus) ([place]) 08/22/2013  . Dehydration 08/22/2013  . Memory loss 04/23/2013  . Insomnia   . Carpal tunnel syndrome   . Diverticulosis     Orientation RESPIRATION BLADDER Height & Weight     Self, Place  Normal Continent Weight: 218 lb 14.4 oz (99.3 kg) Height:  6\' 3"  (190.5 cm)  BEHAVIORAL SYMPTOMS/MOOD NEUROLOGICAL BOWEL NUTRITION STATUS      Continent    AMBULATORY STATUS COMMUNICATION OF NEEDS Skin   Limited Assist Verbally Normal                        Personal Care Assistance Level of Assistance  Bathing, Dressing Bathing Assistance: Limited assistance   Dressing Assistance: Limited assistance     Functional Limitations Info  Sight Sight Info: Impaired (no right eyeball)        SPECIAL CARE FACTORS FREQUENCY  PT (By licensed PT), OT (By licensed OT)     PT Frequency: 5x/wk OT Frequency: 5x/wk            Contractures      Additional Factors Info  Code Status, Allergies, Insulin Sliding Scale Code Status Info: Full Allergies Info: Donepezil, Hydrocodone   Insulin Sliding Scale Info: every 4 hours       Current Medications (05/28/2017):  This is the current hospital active medication list Current Facility-Administered Medications  Medication Dose Route Frequency Provider Last Rate Last Dose  . 0.9 %  sodium chloride infusion  1,000 mL Intravenous Continuous Doutova, Anastassia, MD 75 mL/hr at 05/28/17 0511 1,000 mL at 05/28/17 0511  . 0.9 %  sodium chloride infusion   Intravenous Continuous Toy Baker, MD   Stopped at 05/28/17 0358  . acetaminophen (TYLENOL) tablet 650 mg  650 mg Oral Q4H PRN Toy Baker, MD       Or  . acetaminophen (TYLENOL) solution 650 mg  650 mg Per Tube Q4H PRN Doutova, Anastassia, MD       Or  . acetaminophen (TYLENOL) suppository 650 mg  650 mg Rectal Q4H PRN Toy Baker, MD      . apixaban (ELIQUIS) tablet 5 mg  5 mg Oral BID Doutova, Anastassia, MD      . gabapentin (NEURONTIN) capsule 200 mg  200 mg Oral QHS Doutova, Anastassia, MD      . insulin aspart (novoLOG) injection 0-9 Units  0-9 Units Subcutaneous Q4H Toy Baker, MD   2 Units at 05/28/17 1313  . insulin detemir (LEVEMIR) injection 7 Units  7 Units Subcutaneous QHS Doutova, Anastassia, MD      . memantine (NAMENDA) tablet 10 mg  10 mg Oral BID Toy Baker, MD   10 mg at 05/28/17 0905  . pantoprazole (PROTONIX) EC tablet 40 mg  40 mg Oral Daily Toy Baker,  MD   40 mg at 05/28/17 0905  . piperacillin-tazobactam (ZOSYN) IVPB 3.375 g  3.375 g Intravenous Q8H Doutova, Anastassia, MD 12.5 mL/hr at 05/28/17 1313 3.375 g at 05/28/17 1313  . polyethylene glycol (MIRALAX / GLYCOLAX) packet 17 g  17 g Oral Daily PRN Doutova, Anastassia, MD      . senna-docusate (Senokot-S) tablet 1 tablet  1 tablet Oral QHS PRN Doutova, Anastassia, MD      . simvastatin (ZOCOR) tablet 40 mg  40 mg Oral q morning - 10a Doutova, Anastassia, MD   40 mg at 05/28/17 0905  . vancomycin (VANCOCIN) IVPB 1000 mg/200 mL premix  1,000 mg Intravenous Q24H Toy Baker, MD         Discharge Medications: Please see discharge summary for a list of discharge medications.  Relevant Imaging Results:  Relevant Lab Results:   Additional Information SS#: [id number]  Geralynn Ochs, LCSW

## 2017-05-28 NOTE — Progress Notes (Signed)
Telemetry called to inform RN, pt had bigeminy PVC's; MD paged and notified. No new orders received. Awaiting on MD; EKG completed; will continue to monitor pt closely. Delia Heady RN

## 2017-05-28 NOTE — Progress Notes (Signed)
Patient arrived around 0230 from [facility] ED alert and oriented to self, situation, place not time, family was with him CT was negative he just went for MRI 0352. He has had several falls recently but skin looks good, will continue to monitor will resume care when he returns from MRI.

## 2017-05-28 NOTE — Consult Note (Signed)
Referring Physician: Dr. Darrick Meigs    Chief Complaint: Right sided weakness  HPI: Marvin PETROSIAN Sr. is an 83 y.o. male with dementia who presented to [facility] ED with sepsis and right sided weakness. The weakness began at 10:30 AM on Monday. Right sided weakness had almost completely resolved while at [facility]. Also had generalized weakness, generalized fatigue, dark colored urine, fever up to 102 and worsening ability to ambulate.  He has a history of PE and is currently on Xarelto. He was switched from Coumadin one week ago. PMHx also includes DM, hypercholesterolemia and HTN.  LSN: 10:30 AM Monday tPA Given: No: Out of time window  Past Medical History:  Diagnosis Date  . Arthritis   . Asthma    hx of as child  . Complete loss of vision    right eye  . Dementia   . Diabetes mellitus   . Headache(784.0)    slight  . Hypercholesteremia   . Hypertension   . Insomnia    occasional  . Memory loss   . Nephrolithiasis    right UVJ stone  . Peripheral neuropathy    both legs  . PONV (postoperative nausea and vomiting)   . Prostate cancer [facility])     Past Surgical History:  Procedure Laterality Date  . APPENDECTOMY    . Cornea removed Right 02-26-15  . CYSTOSCOPY W/ URETERAL STENT PLACEMENT Right 10/01/2013   Procedure: cystoscopy right retrograde holmium laser lithotripsy;  Surgeon: Franchot Gallo, MD;  Location: [facility];  Service: Urology;  Laterality: Right;  holmium laser application  . HEMORROIDECTOMY    . KNEE ARTHROSCOPY Right   . PARS PLANA VITRECTOMY  10/24/2011   Procedure: PARS PLANA VITRECTOMY WITH 23 GAUGE;  Surgeon: Adonis Brook, MD;  Location: [place];  Service: Ophthalmology;  Laterality: Right;  . TONSILLECTOMY  as teen    Family History  Problem Relation Age of Onset  . Cancer Mother        uterus  . Heart Problems Father   . Cancer Brother        stomach  . Cancer Brother        throat  . Colon cancer Brother    Social History:  reports that he quit smoking about 50  years ago. His smoking use included Cigarettes. He smoked 0.25 packs per day. He has never used smokeless tobacco. He reports that he drinks alcohol. He reports that he does not use drugs.  Allergies:  Allergies  Allergen Reactions  . Donepezil Nausea And Vomiting  . Hydrocodone Nausea And Vomiting    Medications:  Prior to Admission:  Prescriptions Prior to Admission  Medication Sig Dispense Refill Last Dose  . BD PEN NEEDLE NANO U/F 32G X 4 MM MISC See admin instructions.  4 Taking  . Carboxymethylcellul-Glycerin 0.5-0.9 % SOLN Apply 1 drop to eye daily after breakfast.    05/27/2017 at 1152  . docusate sodium 100 MG CAPS Take 200 mg by mouth 2 (two) times daily as needed for mild constipation. (Patient taking differently: Take 200 mg by mouth 3 (three) times a week. ) 25 capsule 0 05/25/2017  . erythromycin ophthalmic ointment Place 1 application into the right eye at bedtime.  12 05/26/2017 at Unknown time  . esomeprazole (NEXIUM) 40 MG capsule Take 1 capsule (40 mg total) by mouth 2 (two) times daily before a meal. 60 capsule 1 05/27/2017 at 1152  . famotidine (PEPCID) 20 MG tablet Take 1 tablet (20 mg total) by  mouth at bedtime. 30 tablet 1 05/26/2017 at Unknown time  . furosemide (LASIX) 40 MG tablet Take 40 mg by mouth daily after breakfast.    05/27/2017 at 1152  . gabapentin (NEURONTIN) 100 MG capsule TAKE 2 CAPSULES BY MOUTH EVERY DAY AT BEDTIME  11 05/26/2017 at Unknown time  . glipiZIDE (GLUCOTROL XL) 10 MG 24 hr tablet Take 10 mg by mouth daily with breakfast.    05/27/2017 at 1152  . HUMALOG KWIKPEN 100 UNIT/ML KiwkPen Inject 2-7 Units into the skin 2 (two) times daily with a meal. 2 units if sugar level 150-200 and 7 units when above 200   05/27/2017 at 1152  . insulin detemir (LEVEMIR) 100 UNIT/ML injection Inject 0.1 mLs (10 Units total) into the skin daily at 10 pm. (Patient taking differently: Inject 10 Units into the skin 2 (two) times daily. ) 10 mL 11 05/27/2017 at 1152  .  Loratadine (CLARITIN) 10 MG CAPS Take 10 mg by mouth daily after breakfast.    05/27/2017 at 1152  . memantine (NAMENDA) 10 MG tablet Take 1 tablet (10 mg total) by mouth 2 (two) times daily. Please call [phone] to schedule yearly appt. 180 tablet 0 05/27/2017 at 1152  . metoprolol tartrate (LOPRESSOR) 25 MG tablet Take 12.5 mg by mouth 2 (two) times daily.    05/27/2017 at 1700  . Multiple Vitamins-Minerals (ELDERTONIC PO) Take 15 mLs by mouth 2 (two) times daily.   05/27/2017 at 1152  . polyethylene glycol (MIRALAX / GLYCOLAX) packet Take 17 g by mouth daily as needed for mild constipation. Takes on MW  6 unk  . rivaroxaban (XARELTO) 10 MG TABS tablet Take 15 mg by mouth daily after breakfast.    05/27/2017 at 1152  . simvastatin (ZOCOR) 40 MG tablet Take 40 mg by mouth every morning.    05/27/2017 at 1152   Scheduled: . apixaban  5 mg Oral BID  . aspirin  300 mg Rectal Daily   Or  . aspirin  325 mg Oral Daily  . gabapentin  200 mg Oral QHS  . insulin aspart  0-9 Units Subcutaneous Q4H  . insulin detemir  7 Units Subcutaneous QHS  . memantine  10 mg Oral BID  . pantoprazole  40 mg Oral Daily  . simvastatin  40 mg Oral q morning - 10a   Continuous: . sodium chloride 1,000 mL (05/28/17 0511)  . sodium chloride Stopped (05/28/17 0358)  . piperacillin-tazobactam (ZOSYN)  IV 3.375 g (05/28/17 0511)  . vancomycin     IOE:[id number] **OR** acetaminophen (TYLENOL) oral liquid 160 mg/5 mL **OR** acetaminophen, polyethylene glycol, senna-docusate  ROS: Had chest pain 2 days ago. No headache or abdominal pain. Positive for right shoulder pain.   Physical Examination: Blood pressure 104/62, pulse 71, temperature 98.5 F (36.9 C), temperature source Oral, resp. rate 15, height '6\' 3"'$  (1.905 m), weight 99.3 kg (218 lb 14.4 oz), SpO2 100 %.  HEENT: [place]/AT Lungs: Respirations unlabored Ext: Warm and well-perfused  Neurologic Examination: Mental Status: Awake and alert. Some trouble with naming.  Speech without errors of syntax or grammar. Some difficulty with repetition. Difficulty with a directional 2-step command.  Cranial Nerves: II:  Visual fields grossly normal in left eye. Right globe not present. Left pupil reactive.  III,IV, VI: Squints right eye chronically. EOM of left eye are intact. V,VII: smile symmetric, facial temp sensation normal bilaterally VIII: hearing intact to voice IX,X: no hypophonia XI: symmetric XII: midline tongue extension  Motor: Right :  Upper extremity   4/5    Left:     Upper extremity   5/5  Lower extremity   4/5     Lower extremity   5/5 No atrophy noted Sensory: Temp and light touch intact x 4. No extinction.  Deep Tendon Reflexes:  1+ bilateral upper extremities. Hypoactive lower extremities.  Plantars: Mute bilaterally.  Cerebellar: No ataxia with FNF bilaterally Gait: Deferred  Results for orders placed or performed during the hospital encounter of 05/27/17 (from the past 48 hour(s))  Comprehensive metabolic panel     Status: Abnormal   Collection Time: 05/27/17  8:49 PM  Result Value Ref Range   Sodium 139 135 - 145 mmol/L   Potassium 3.7 3.5 - 5.1 mmol/L   Chloride 101 101 - 111 mmol/L   CO2 27 22 - 32 mmol/L   Glucose, Bld 290 (H) 65 - 99 mg/dL   BUN 30 (H) 6 - 20 mg/dL   Creatinine, Ser 1.87 (H) 0.61 - 1.24 mg/dL   Calcium 9.2 8.9 - 10.3 mg/dL   Total Protein 7.5 6.5 - 8.1 g/dL   Albumin 3.6 3.5 - 5.0 g/dL   AST 21 15 - 41 U/L   ALT 17 17 - 63 U/L   Alkaline Phosphatase 73 38 - 126 U/L   Total Bilirubin 0.9 0.3 - 1.2 mg/dL   GFR calc non Af Amer 29 (L) >60 mL/min   GFR calc Af Amer 34 (L) >60 mL/min    Comment: (NOTE) The eGFR has been calculated using the CKD EPI equation. This calculation has not been validated in all clinical situations. eGFR's persistently <60 mL/min signify possible Chronic Kidney Disease.    Anion gap 11 5 - 15  CBC with Differential     Status: Abnormal   Collection Time: 05/27/17  8:49 PM   Result Value Ref Range   WBC 14.4 (H) 4.0 - 10.5 K/uL   RBC 4.89 4.22 - 5.81 MIL/uL   Hemoglobin 13.6 13.0 - 17.0 g/dL   HCT 39.3 39.0 - 52.0 %   MCV 80.4 78.0 - 100.0 fL   MCH 27.8 26.0 - 34.0 pg   MCHC 34.6 30.0 - 36.0 g/dL   RDW 14.9 11.5 - 15.5 %   Platelets 217 150 - 400 K/uL   Neutrophils Relative % 79 %   Neutro Abs 11.5 (H) 1.7 - 7.7 K/uL   Lymphocytes Relative 6 %   Lymphs Abs 0.8 0.7 - 4.0 K/uL   Monocytes Relative 15 %   Monocytes Absolute 2.1 (H) 0.1 - 1.0 K/uL   Eosinophils Relative 0 %   Eosinophils Absolute 0.0 0.0 - 0.7 K/uL   Basophils Relative 0 %   Basophils Absolute 0.0 0.0 - 0.1 K/uL  Protime-INR     Status: Abnormal   Collection Time: 05/27/17  8:49 PM  Result Value Ref Range   Prothrombin Time 26.0 (H) 11.4 - 15.2 seconds   INR 2.33   I-Stat Troponin, ED (not at [facility])     Status: None   Collection Time: 05/27/17  9:13 PM  Result Value Ref Range   Troponin i, poc 0.04 0.00 - 0.08 ng/mL   Comment 3            Comment: Due to the release kinetics of cTnI, a negative result within the first hours of the onset of symptoms does not rule out myocardial infarction with certainty. If myocardial infarction is still suspected, repeat the test at appropriate intervals.  I-Stat CG4 Lactic Acid, ED     Status: Abnormal   Collection Time: 05/27/17  9:15 PM  Result Value Ref Range   Lactic Acid, Venous 2.37 (HH) 0.5 - 1.9 mmol/L   Comment NOTIFIED PHYSICIAN   Urinalysis, Routine w reflex microscopic     Status: Abnormal   Collection Time: 05/27/17  9:20 PM  Result Value Ref Range   Color, Urine YELLOW YELLOW   APPearance HAZY (A) CLEAR   Specific Gravity, Urine 1.010 1.005 - 1.030   pH 5.0 5.0 - 8.0   Glucose, UA 50 (A) NEGATIVE mg/dL   Hgb urine dipstick MODERATE (A) NEGATIVE   Bilirubin Urine NEGATIVE NEGATIVE   Ketones, ur NEGATIVE NEGATIVE mg/dL   Protein, ur NEGATIVE NEGATIVE mg/dL   Nitrite NEGATIVE NEGATIVE   Leukocytes, UA SMALL (A) NEGATIVE    RBC / HPF 0-5 0 - 5 RBC/hpf   WBC, UA 6-30 0 - 5 WBC/hpf   Bacteria, UA RARE (A) NONE SEEN   Squamous Epithelial / LPF NONE SEEN NONE SEEN   Mucous PRESENT    Hyaline Casts, UA PRESENT   I-Stat CG4 Lactic Acid, ED     Status: None   Collection Time: 05/28/17 12:13 AM  Result Value Ref Range   Lactic Acid, Venous 1.58 0.5 - 1.9 mmol/L  Glucose, capillary     Status: Abnormal   Collection Time: 05/28/17  4:55 AM  Result Value Ref Range   Glucose-Capillary 149 (H) 65 - 99 mg/dL   Comment 1 Notify RN    Comment 2 Document in Chart    Dg Chest 2 View  Result Date: 05/27/2017 CLINICAL DATA:  Increasing weakness EXAM: CHEST  2 VIEW COMPARISON:  01/27/2017 FINDINGS: Mild increased opacity at the right base. No pleural effusion. Stable cardiomediastinal silhouette with atherosclerosis. No pneumothorax. Degenerative changes of the spine. IMPRESSION: Mild right basilar atelectasis or infiltrate. Electronically Signed   By: Donavan Foil M.D.   On: 05/27/2017 21:40   Ct Head Wo Contrast  Result Date: 05/27/2017 CLINICAL DATA:  Acute onset of worsening generalized weakness and difficulty walking. Bilateral hand pain. Initial encounter. EXAM: CT HEAD WITHOUT CONTRAST TECHNIQUE: Contiguous axial images were obtained from the base of the skull through the vertex without intravenous contrast. COMPARISON:  CT of the head performed 01/27/2017 FINDINGS: Brain: No evidence of acute infarction, hemorrhage, hydrocephalus, extra-axial collection or mass lesion/mass effect. Prominence of the ventricles and sulci reflects moderate cortical volume loss. Cerebellar atrophy is noted. Diffuse periventricular and subcortical white matter change likely reflects small vessel ischemic microangiopathy. The brainstem and fourth ventricle are within normal limits. The basal ganglia are unremarkable in appearance. The cerebral hemispheres demonstrate grossly normal gray-white differentiation. No mass effect or midline shift is  seen. Vascular: No hyperdense vessel or unexpected calcification. Skull: There is no evidence of fracture; visualized osseous structures are unremarkable in appearance. Sinuses/Orbits: Postoperative change is noted at the right optic globe. The visualized portions of the left orbit are unremarkable in appearance. The paranasal sinuses and mastoid air cells are well-aerated. Other: No significant soft tissue abnormalities are seen. IMPRESSION: 1. No acute intracranial pathology seen on CT. 2. Moderate cortical volume loss and diffuse small vessel ischemic microangiopathy. Electronically Signed   By: Garald Balding M.D.   On: 05/27/2017 22:04   Mr Brain Wo Contrast  Result Date: 05/28/2017 CLINICAL DATA:  83 y/o M; right-sided weakness and fever. TIA, initial exam. EXAM: MRI HEAD WITHOUT CONTRAST MRA HEAD WITHOUT CONTRAST TECHNIQUE: Multiplanar,  multiecho pulse sequences of the brain and surrounding structures were obtained without intravenous contrast. Angiographic images of the head were obtained using MRA technique without contrast. COMPARISON:  05/27/2017 CT head. FINDINGS: MRI HEAD FINDINGS Brain: No acute infarction, hemorrhage, hydrocephalus, extra-axial collection or mass lesion. Patchy nonspecific foci of T2 FLAIR hyperintense signal abnormality in periventricular white matter is compatible with moderate chronic microvascular ischemic changes for age. Advanced brain parenchymal volume loss. Vascular: As below. Skull and upper cervical spine: Normal marrow signal. Sinuses/Orbits: Negative. Other: None. MRA HEAD FINDINGS Motion degraded study, suboptimal evaluation for subtle stenosis or small aneurysm. Anterior circulation: No large vessel occlusion or high-grade stenosis. 3 mm superiorly directed outpouching of the right cavernous ICA probably representing a small saccular aneurysm (series 3, image 118). No additional aneurysm identified. Posterior circulation left dominant vertebrobasilar system. No  large vessel occlusion or high-grade stenosis. Anatomic variant: Anterior communicating artery and right posterior communicating arteries are present. No left posterior communicating artery identified, likely hypoplastic or absent. IMPRESSION: MRI head: 1. No acute intracranial abnormality identified. 2. Moderate for age chronic microvascular ischemic changes and advanced parenchymal volume loss of the brain. MRI head: 1. Motion degraded study, suboptimal evaluation for subtle stenosis or small aneurysm. 2. Patent circle of Willis. No large vessel occlusion or high-grade stenosis. 3. 3 mm outpouching superiorly directed from right cavernous ICA, probably a small saccular aneurysm. Electronically Signed   By: Kristine Garbe M.D.   On: 05/28/2017 05:09   Mr Jodene Nam Head Wo Contrast  Result Date: 05/28/2017 CLINICAL DATA:  83 y/o M; right-sided weakness and fever. TIA, initial exam. EXAM: MRI HEAD WITHOUT CONTRAST MRA HEAD WITHOUT CONTRAST TECHNIQUE: Multiplanar, multiecho pulse sequences of the brain and surrounding structures were obtained without intravenous contrast. Angiographic images of the head were obtained using MRA technique without contrast. COMPARISON:  05/27/2017 CT head. FINDINGS: MRI HEAD FINDINGS Brain: No acute infarction, hemorrhage, hydrocephalus, extra-axial collection or mass lesion. Patchy nonspecific foci of T2 FLAIR hyperintense signal abnormality in periventricular white matter is compatible with moderate chronic microvascular ischemic changes for age. Advanced brain parenchymal volume loss. Vascular: As below. Skull and upper cervical spine: Normal marrow signal. Sinuses/Orbits: Negative. Other: None. MRA HEAD FINDINGS Motion degraded study, suboptimal evaluation for subtle stenosis or small aneurysm. Anterior circulation: No large vessel occlusion or high-grade stenosis. 3 mm superiorly directed outpouching of the right cavernous ICA probably representing a small saccular aneurysm  (series 3, image 118). No additional aneurysm identified. Posterior circulation left dominant vertebrobasilar system. No large vessel occlusion or high-grade stenosis. Anatomic variant: Anterior communicating artery and right posterior communicating arteries are present. No left posterior communicating artery identified, likely hypoplastic or absent. IMPRESSION: MRI head: 1. No acute intracranial abnormality identified. 2. Moderate for age chronic microvascular ischemic changes and advanced parenchymal volume loss of the brain. MRI head: 1. Motion degraded study, suboptimal evaluation for subtle stenosis or small aneurysm. 2. Patent circle of Willis. No large vessel occlusion or high-grade stenosis. 3. 3 mm outpouching superiorly directed from right cavernous ICA, probably a small saccular aneurysm. Electronically Signed   By: Kristine Garbe M.D.   On: 05/28/2017 05:09    Assessment: 83 y.o. male with sepsis and right sided weakness 1. MRI brain reveals no acute infarction. Patchy nonspecific foci of T2 FLAIR hyperintense signal abnormality in periventricular white matter is compatible with moderate chronic microvascular ischemic changes for age. Advanced brain parenchymal volume loss also noted.  2. MRA brain reveals no LVO or high grade stenosis.  A small, 3 mm saccular aneurysm is noted.  3. Stroke Risk Factors - HTN, hypercholesterolemia, DM 4. Given negative MRI brain, possibility of right cervical hemicord lesion should be considered 5. On anticoagulation for PE  Plan: 1. MRI cervical spine 2. PT consult, OT consult 3. Telemetry monitoring 4. Treatment of sepsis per primary team.  _0  signed: Dr. Kerney Elbe 05/28/2017, 7:34 AM

## 2017-05-28 NOTE — Progress Notes (Signed)
PT Cancellation Note  Patient Details Name: Marvin GUT Sr. MRN: [id number] DOB: [birth year]   Cancelled Treatment:    Reason Eval/Treat Not Completed: Patient at procedure or test/unavailable   Duncan Dull 05/28/2017, 11:28 AM

## 2017-05-28 NOTE — Progress Notes (Signed)
*  PRELIMINARY RESULTS* Vascular Ultrasound Carotid Duplex has been completed.  Preliminary findings: Bilateral: No significant (1-39%) ICA stenosis. Antegrade vertebral flow.    Landry Mellow, RDMS, RVT  05/28/2017, 12:22 PM

## 2017-05-28 NOTE — Progress Notes (Signed)
  Echocardiogram 2D Echocardiogram has been performed.  Tresa Res 05/28/2017, 1:45 PM

## 2017-05-28 NOTE — Evaluation (Signed)
Physical Therapy Evaluation Patient Details Name: Marvin HESSEL Sr. MRN: [id number] DOB: August 28, 1920 Today's Date: 05/28/2017   History of Present Illness  Pt is a 83 y.o. male with medical history significant of dementia, diabetes, mellitus, hypertension, hyperlipidemia, history of prostate cancer, and history of PE on chronic anticoagulation. He presented to the ED with R sided weakness and fever. MRI negative for acute infarct.  Clinical Impression  Orders received for PT evaluation. Patient demonstrates deficits in functional mobility as indicated below. Will benefit from continued skilled PT to address deficits and maximize function. Will see as indicated and progress as tolerated.  Will likely need post acute rehabilitation upon discharge recommend SNF.    Follow Up Recommendations SNF;Supervision/Assistance - 24 hour    Equipment Recommendations   (TBD)    Recommendations for Other Services       Precautions / Restrictions Precautions Precautions: Fall Precaution Comments: Poor coordination Restrictions Weight Bearing Restrictions: No      Mobility  Bed Mobility Overal bed mobility: Needs Assistance Bed Mobility: Supine to Sit;Sit to Supine     Supine to sit: Mod assist Sit to supine: Min assist   General bed mobility comments: Mod assist to raise trunk from bed and multimodal cues for sequencing.   Transfers Overall transfer level: Needs assistance Equipment used: 2 person hand held assist Transfers: Sit to/from [facility] Sit to Stand: Mod assist Stand pivot transfers: Mod assist       General transfer comment: Pt requiring physical assistance to keep L foot on floor when attempting to shift weight.   Ambulation/Gait             General Gait Details: deferred ambulation at this time for safety  Stairs            Wheelchair Mobility    Modified Rankin (Stroke Patients Only)       Balance Overall balance assessment:  Needs assistance;History of Falls Sitting-balance support: Feet supported;No upper extremity supported Sitting balance-Leahy Scale: Fair     Standing balance support: Bilateral upper extremity supported;During functional activity Standing balance-Leahy Scale: Poor                               Pertinent Vitals/Pain Pain Assessment: Faces Faces Pain Scale: Hurts even more Pain Location: R neck, shoulder, and side Pain Descriptors / Indicators: Aching;Tightness;Sharp Pain Intervention(s): Monitored during session;Limited activity within patient's tolerance;Repositioned    Home Living Family/patient expects to be discharged to:: Private residence Living Arrangements: Children Available Help at Discharge: Family (unsure due to pt inconsistent report) Type of Home: House Home Access: Stairs to enter Entrance Stairs-Rails: Can reach both;Right;Left Entrance Stairs-Number of Steps: 3 Home Layout: One level Home Equipment: Walker - 2 wheels      Prior Function           Comments: Difficult to determine level of independence PTA due to insonsistent report from pt. Initially reporting that he is independent with ADL but then "my daughter takes care of me."     Hand Dominance   Dominant Hand: Left    Extremity/Trunk Assessment   Upper Extremity Assessment Upper Extremity Assessment: RUE deficits/detail;Generalized weakness RUE Deficits / Details: Painful with movement in all planes and greatest in eccentric contraction RUE: Unable to fully assess due to pain    Lower Extremity Assessment Lower Extremity Assessment: Generalized weakness;RLE deficits/detail;LLE deficits/detail;Difficult to assess due to impaired cognition  Communication   Communication: No difficulties  Cognition Arousal/Alertness: Awake/alert Behavior During Therapy: WFL for tasks assessed/performed Overall Cognitive Status: History of cognitive impairments - at baseline Area of  Impairment: Attention;Memory;Following commands;Safety/judgement;Awareness;Problem solving                   Current Attention Level: Sustained Memory: Decreased short-term memory;Decreased recall of precautions Following Commands: Follows one step commands inconsistently Safety/Judgement: Decreased awareness of safety;Decreased awareness of deficits Awareness: Intellectual Problem Solving: Slow processing;Decreased initiation;Difficulty sequencing;Requires verbal cues;Requires tactile cues General Comments: Pt confused at times with poor ability to sequence and follow commands.       General Comments      Exercises     Assessment/Plan    PT Assessment Patient needs continued PT services  PT Problem List Decreased strength;Decreased activity tolerance;Decreased balance;Decreased mobility;Decreased coordination;Decreased cognition;Decreased safety awareness       PT Treatment Interventions DME instruction;Gait training;Functional mobility training;Therapeutic activities;Therapeutic exercise;Balance training;Cognitive remediation;Patient/family education    PT Goals (Current goals can be found in the Care Plan section)  Acute Rehab PT Goals Patient Stated Goal: to go to the bathroom PT Goal Formulation: Patient unable to participate in goal setting Time For Goal Achievement: 06/11/17 Potential to Achieve Goals: Fair    Frequency Min 2X/week   Barriers to discharge        Co-evaluation PT/OT/SLP Co-Evaluation/Treatment: Yes Reason for Co-Treatment: Complexity of the patient's impairments (multi-system involvement);For patient/therapist safety;To address functional/ADL transfers PT goals addressed during session: Mobility/safety with mobility OT goals addressed during session: ADL's and self-care       AM-PAC PT "6 Clicks" Daily Activity  Outcome Measure Difficulty turning over in bed (including adjusting bedclothes, sheets and blankets)?: Total Difficulty moving  from lying on back to sitting on the side of the bed? : Total Difficulty sitting down on and standing up from a chair with arms (e.g., wheelchair, bedside commode, etc,.)?: Total Help needed moving to and from a bed to chair (including a wheelchair)?: A Lot Help needed walking in hospital room?: A Lot Help needed climbing 3-5 steps with a railing? : A Lot 6 Click Score: 9    End of Session   Activity Tolerance: Other (comment) (limited by arrival of transport) Patient left: in bed (with transport) Nurse Communication: Mobility status PT Visit Diagnosis: Unsteadiness on feet (R26.81);Other symptoms and signs involving the nervous system (R29.898);Muscle weakness (generalized) (M62.81);Other abnormalities of gait and mobility (R26.89)    Time: 0938-1829 PT Time Calculation (min) (ACUTE ONLY): 10 min   Charges:   PT Evaluation $PT Eval Moderate Complexity: 1 Mod     PT G Codes:        Alben Deeds, PT DPT  Board Certified Neurologic Specialist [place] 05/28/2017, 1:57 PM

## 2017-05-28 NOTE — Progress Notes (Signed)
Interim progress note   The patient was evaluated earlier today by my colleague Dr. Cheral Marker. for right-side weakness. I saw and evaluated the patient today. On his exam, he had no right leg weakness and it appeared his right arm was weak due to limitation by pain. He had excellent grip strength, but when asked to raise his arm he was grimacing. I palpated his right shoulder and found to be tender. His exam is concerning for R rotator cuff pathology such as adhesive capsulitis, tendinitis. MRI head was negative for stroke. We had recommended for MRI C-spine. I think an MRI of the shoulder is warranted and a consult with either orthopedics/PMNR would be appropriate for management of shoulder pain.

## 2017-05-28 NOTE — Clinical Social Work Note (Signed)
Clinical Social Work Assessment  Patient Details  Name: Marvin Bishop Sr. MRN: [id number] Date of Birth: 09/17/1920  Date of referral:  05/28/17               Reason for consult:  Facility Placement, Discharge Planning                Permission sought to share information with:  Facility Sport and exercise psychologist, Family Supports Permission granted to share information::  Yes, Verbal Permission Granted  Name::     Marvin Bishop  Agency::  SNF  Relationship::  Daughters  Sport and exercise psychologist Information:     Housing/Transportation Living arrangements for the past 2 months:  Single Family Home Source of Information:  Adult Children Patient Interpreter Needed:  None Criminal Activity/Legal Involvement Pertinent to Current Situation/Hospitalization:  No - Comment as needed Significant Relationships:  Adult Children Lives with:  Self, Adult Children Do you feel safe going back to the place where you live?  Yes Need for family participation in patient care:  Yes (Comment) (patient has dementia)  Care giving concerns:  Patient has been living at home with children, but would benefit from short term rehab prior to returning home in order to improve independence.   Social Worker assessment / plan:  CSW spoke with patient's daughter, Marvin Bishop, over the phone to discuss recommendation for SNF. Patient's daughter acknowledged that patient had been at [facility] in the past and the family was appreciative of the care that the patient received, would prefer to go back if available. CSW completed referral and faxed to facilities. CSW will follow up to determine bed availability and facilitate discharge when medically ready.  Employment status:  Retired Nurse, adult PT Recommendations:  [place] / Referral to community resources:  [place]  Patient/Family's Response to care:  Patient's daughter agreeable to SNF placement.  Patient/Family's  Understanding of and Emotional Response to Diagnosis, Current Treatment, and Prognosis:  Patient's daughter understands patient's increased needs at this time and that the patient will benefit from physical rehab prior to returning home. Patient's daughter had questions about patient's illness and indicated that she would discuss concerns with the doctor.   Emotional Assessment Appearance:  Appears stated age Attitude/Demeanor/Rapport:    Affect (typically observed):  Appropriate Orientation:  Oriented to Self, Oriented to Place Alcohol / Substance use:  Not Applicable Psych involvement (Current and /or in the community):  No (Comment)  Discharge Needs  Concerns to be addressed:  Care Coordination, Discharge Planning Concerns Readmission within the last 30 days:  No Current discharge risk:  Physical Impairment, Cognitively Impaired Barriers to Discharge:  Continued Medical Work up, [place], [place] 05/28/2017, 4:09 PM

## 2017-05-28 NOTE — Evaluation (Signed)
Occupational Therapy Evaluation Patient Details Name: Marvin DEVONSHIRE Sr. MRN: [id number] DOB: 07/14/1920 Today's Date: 05/28/2017    History of Present Illness Pt is Marvin 83 y.o. male with medical history significant of dementia, diabetes, mellitus, hypertension, hyperlipidemia, history of prostate cancer, and history of PE on chronic anticoagulation. He presented to the ED with R sided weakness and fever. MRI negative for acute infarct.   Clinical Impression   PTA, pt was living with his daughter and reports his level of independence inconsistently. He currently requires mod assist +2 for toilet transfers and max assist for LB ADL. Pt presents to OT with confusion, decreased motor planning skills, poor ability to follow commands, and generalized weakness. Additionally, pt with decreased functional use of R eye at baseline. Pt would best benefit from SNF level rehabilitation post-acute D/C in order to maximize independence and safety prior to returning home. OT will continue to follow while admitted.     Follow Up Recommendations  SNF;Supervision/Assistance - 24 hour    Equipment Recommendations  Other (comment) (TBD at next venue of care)    Recommendations for Other Services       Precautions / Restrictions Precautions Precautions: Fall Precaution Comments: Poor coordination Restrictions Weight Bearing Restrictions: No      Mobility Bed Mobility Overal bed mobility: Needs Assistance Bed Mobility: Supine to Sit;Sit to Supine     Supine to sit: Mod assist Sit to supine: Min assist   General bed mobility comments: Mod assist to raise trunk from bed and multimodal cues for sequencing.   Transfers Overall transfer level: Needs assistance   Transfers: Sit to/from Stand;Stand Pivot Transfers Sit to Stand: Mod assist Stand pivot transfers: Mod assist       General transfer comment: Pt requiring physical assistance to keep L foot on floor when attempting to shift weight.      Balance Overall balance assessment: Needs assistance;History of Falls (Simultaneous filing. User may not have seen previous data.) Sitting-balance support: Feet supported;No upper extremity supported Sitting balance-Leahy Scale: Fair     Standing balance support: Bilateral upper extremity supported;During functional activity Standing balance-Leahy Scale: Poor                             ADL either performed or assessed with clinical judgement   ADL Overall ADL's : Needs assistance/impaired Eating/Feeding: Supervision/ safety;Sitting   Grooming: Minimal assistance;Sitting   Upper Body Bathing: Moderate assistance;Sitting   Lower Body Bathing: Maximal assistance;Sit to/from stand   Upper Body Dressing : Moderate assistance;Sitting   Lower Body Dressing: Maximal assistance;Sit to/from stand   Toilet Transfer: Moderate assistance;+2 for physical assistance;Stand-pivot;BSC;RW   Toileting- Clothing Manipulation and Hygiene: Maximal assistance;Sit to/from stand         General ADL Comments: Pt with signficant motor planning deficits impacting safety.      Vision   Additional Comments: Holding R eye closed throughout     [place] tested?: Deficits Deficits: Initiation;Ideation;Ideomotor;Organization Praxis-Other Comments: Poor ability to plan movement for toilet transfer.     Pertinent Vitals/Pain Pain Assessment: Faces Faces Pain Scale: Hurts even more Pain Location: R neck, shoulder, and side Pain Descriptors / Indicators: Aching;Tightness;Sharp Pain Intervention(s): Monitored during session;Limited activity within patient's tolerance;Repositioned     Hand Dominance     Extremity/Trunk Assessment Upper Extremity Assessment Upper Extremity Assessment: RUE deficits/detail;Generalized weakness RUE Deficits / Details: Painful with movement in all planes and greatest in  eccentric contraction RUE: Unable to fully assess due to  pain   Lower Extremity Assessment Lower Extremity Assessment: Defer to PT evaluation       Communication Communication Communication: No difficulties   Cognition Arousal/Alertness: Awake/alert Behavior During Therapy: WFL for tasks assessed/performed Overall Cognitive Status: History of cognitive impairments - at baseline Area of Impairment: Attention;Memory;Following commands;Safety/judgement;Awareness;Problem solving                   Current Attention Level: Sustained Memory: Decreased short-term memory;Decreased recall of precautions Following Commands: Follows one step commands inconsistently Safety/Judgement: Decreased awareness of safety;Decreased awareness of deficits Awareness: Intellectual Problem Solving: Slow processing;Decreased initiation;Difficulty sequencing;Requires verbal cues;Requires tactile cues General Comments: Pt confused at times with poor ability to sequence and follow commands.    General Comments       Exercises     Shoulder Instructions      Home Living Family/patient expects to be discharged to:: Private residence Living Arrangements: Children Available Help at Discharge: Family (unsure due to pt inconsistent report) Type of Home: House Home Access: Stairs to enter [employer] of Steps: 3 Entrance Stairs-Rails: Can reach both;Right;Left Home Layout: One level     Bathroom Shower/Tub: Tub/shower unit         Home Equipment: Walker - 2 wheels          Prior Functioning/Environment          Comments: Difficult to determine level of independence PTA due to insonsistent report from pt. Initially reporting that he is independent with ADL but then "my daughter takes care of me."        OT Problem List: Impaired balance (sitting and/or standing);Decreased safety awareness;Decreased knowledge of use of DME or AE;Decreased knowledge of precautions;Pain;Decreased coordination;Decreased activity tolerance;Decreased  strength      OT Treatment/Interventions: Self-care/ADL training;Therapeutic exercise;Energy conservation;DME and/or AE instruction;Therapeutic activities;Patient/family education;Balance training    OT Goals(Current goals can be found in the care plan section) Acute Rehab OT Goals Patient Stated Goal: to go to the bathroom OT Goal Formulation: With patient Time For Goal Achievement: 06/11/17 Potential to Achieve Goals: Good ADL Goals Pt Will Perform Grooming: with min guard assist;sitting Pt Will Perform Upper Body Dressing: with min guard assist;sitting Pt Will Perform Lower Body Dressing: with min assist;sit to/from stand Pt Will Transfer to Toilet: with min assist;ambulating;bedside commode Pt Will Perform Toileting - Clothing Manipulation and hygiene: with min assist;sit to/from stand Additional ADL Goal #1: Pt will demonstrate selective attention during seated ADL tasks with no more than 1 VC. Additional ADL Goal #2: Pt will demonstrate emergent awareness during morning ADL routine in Marvin minimally distracting environment.   OT Frequency: Min 2X/week   Barriers to D/C:            Co-evaluation PT/OT/SLP Co-Evaluation/Treatment: Yes ((Partial)) Reason for Co-Treatment: Complexity of the patient's impairments (multi-system involvement);For patient/therapist safety;To address functional/ADL transfers   OT goals addressed during session: ADL's and self-care      AM-PAC PT "6 Clicks" Daily Activity     Outcome Measure Help from another person eating meals?: Marvin Little Help from another person taking care of personal grooming?: Marvin Little Help from another person toileting, which includes using toliet, bedpan, or urinal?: Marvin Lot Help from another person bathing (including washing, rinsing, drying)?: Marvin Lot Help from another person to put on and taking off regular upper body clothing?: Marvin Little Help from another person to put on and taking off regular lower body clothing?: Marvin Lot  6  Click Score: 15   End of Session Equipment Utilized During Treatment: Gait belt;Rolling walker  Activity Tolerance: Patient tolerated treatment well Patient left: with call bell/phone within reach;in bed (with transport to echo)  OT Visit Diagnosis: Unsteadiness on feet (R26.81);Muscle weakness (generalized) (M62.81);Ataxia, unspecified (R27.0)                Time: 6468-0321 OT Time Calculation (min): 29 min Charges:  OT General Charges $OT Visit: 1 Procedure OT Evaluation $OT Eval Moderate Complexity: 1 Procedure G-Codes:     Norman Herrlich, MS OTR/L  Pager: [place] Marvin Bishop 05/28/2017, 1:53 PM

## 2017-05-28 NOTE — Progress Notes (Signed)
ANTICOAGULATION CONSULT NOTE - Initial Consult  Pharmacy Consult for Apixaban Indication: pulmonary embolus  Allergies  Allergen Reactions  . Donepezil Nausea And Vomiting  . Hydrocodone Nausea And Vomiting    Patient Measurements: Height: 6' (182.9 cm) Weight: 215 lb (97.5 kg) IBW/kg (Calculated) : 77.6 Heparin Dosing Weight:   Vital Signs: Temp: 99.9 F (37.7 C) (08/13 2201) Temp Source: Rectal (08/13 2201) BP: 90/54 (08/14 0130) Pulse Rate: 78 (08/14 0130)  Labs:  Recent Labs  05/27/17 2049  HGB 13.6  HCT 39.3  PLT 217  LABPROT 26.0*  INR 2.33  CREATININE 1.87*    Estimated Creatinine Clearance: 28.6 mL/min (A) (by C-G formula based on SCr of 1.87 mg/dL (H)).   Medical History: Past Medical History:  Diagnosis Date  . Arthritis   . Asthma    hx of as child  . Complete loss of vision    right eye  . Dementia   . Diabetes mellitus   . Headache(784.0)    slight  . Hypercholesteremia   . Hypertension   . Insomnia    occasional  . Memory loss   . Nephrolithiasis    right UVJ stone  . Peripheral neuropathy    both legs  . PONV (postoperative nausea and vomiting)   . Prostate cancer ([place])     Medications:   (Not in a hospital admission) Scheduled:    Assessment: Patient on rivaroxaban prior to admission for hx of PE.  Patient evaluated for TIA and 8/13 CT notes "No acute intracranial pathology seen on CT."  MD called pharmacy about renal function and dosing restrictions for rivaroxaban.  Patient's renal function is fluctuating at rivaroxaban dosing breakpoint. MD agree that changing to oral anticoagulant which has few renal dosing changes would be best for patient going forward.  Last dose of rivaroxaban 15mg  po daily noted to have been 8/13 at ~1200.    Goal of Therapy:  Dosing of apixaban per dosing guidelines.    Plan:  start apixaban 5mg  po bid at 1800 8/14, then continue with 1000/2200 dosing the next day.  Tyler Deis, Shea Stakes  [place] 05/28/2017,1:46 AM

## 2017-05-28 NOTE — Progress Notes (Signed)
Pharmacy Antibiotic Note  Marvin Bishop. is a 82 y.o. male admitted on 05/27/2017 with sepsis.  Pharmacy has been consulted for Vancomycin and Zosyn dosing.  Plan: Vancomycin 1gm IV every 24 hours.  Goal trough 15-20 mcg/mL. Zosyn 3.375g IV q8h (4 hour infusion).  Height: 6\' 3"  (190.5 cm) Weight: 218 lb 14.4 oz (99.3 kg) IBW/kg (Calculated) : 84.5  Temp (24hrs), Avg:100.2 F (37.9 C), Min:98.2 F (36.8 C), Max:102.5 F (39.2 C)   Recent Labs Lab 05/27/17 2049 05/27/17 2115 05/28/17 0013  WBC 14.4*  --   --   CREATININE 1.87*  --   --   LATICACIDVEN  --  2.37* 1.58    Estimated Creatinine Clearance: 28.2 mL/min (A) (by C-G formula based on SCr of 1.87 mg/dL (H)).    Allergies  Allergen Reactions  . Donepezil Nausea And Vomiting  . Hydrocodone Nausea And Vomiting    Antimicrobials this admission: Vancomycin 05/27/2017 >> Zosyn 05/27/2017 >>   Dose adjustments this admission: -  Microbiology results: pending  Thank you for allowing pharmacy to be a part of this patient's care.  Nani Skillern [place] 05/28/2017 2:54 AM

## 2017-05-28 NOTE — ED Notes (Signed)
[facility] at bedside for transport.

## 2017-05-28 NOTE — ED Notes (Signed)
Patient denies pain and is resting comfortably.  

## 2017-05-29 ENCOUNTER — Inpatient Hospital Stay (HOSPITAL_COMMUNITY): Payer: Medicare Other

## 2017-05-29 DIAGNOSIS — A4151 Sepsis due to Escherichia coli [E. coli]: Secondary | ICD-10-CM

## 2017-05-29 LAB — CBC WITH DIFFERENTIAL/PLATELET
BASOS PCT: 0 %
Basophils Absolute: 0 10*3/uL (ref 0.0–0.1)
EOS ABS: 0.1 10*3/uL (ref 0.0–0.7)
Eosinophils Relative: 1 %
HEMATOCRIT: 36.3 % — AB (ref 39.0–52.0)
HEMOGLOBIN: 12.4 g/dL — AB (ref 13.0–17.0)
LYMPHS ABS: 0.9 10*3/uL (ref 0.7–4.0)
Lymphocytes Relative: 9 %
MCH: 27.4 pg (ref 26.0–34.0)
MCHC: 34.2 g/dL (ref 30.0–36.0)
MCV: 80.1 fL (ref 78.0–100.0)
MONO ABS: 1.2 10*3/uL — AB (ref 0.1–1.0)
MONOS PCT: 12 %
NEUTROS ABS: 7.6 10*3/uL (ref 1.7–7.7)
Neutrophils Relative %: 78 %
Platelets: 193 10*3/uL (ref 150–400)
RBC: 4.53 MIL/uL (ref 4.22–5.81)
RDW: 14.9 % (ref 11.5–15.5)
WBC: 9.9 10*3/uL (ref 4.0–10.5)

## 2017-05-29 LAB — COMPREHENSIVE METABOLIC PANEL
ALBUMIN: 2.7 g/dL — AB (ref 3.5–5.0)
ALK PHOS: 54 U/L (ref 38–126)
ALT: 18 U/L (ref 17–63)
ANION GAP: 8 (ref 5–15)
AST: 25 U/L (ref 15–41)
BILIRUBIN TOTAL: 1.2 mg/dL (ref 0.3–1.2)
BUN: 16 mg/dL (ref 6–20)
CALCIUM: 8.6 mg/dL — AB (ref 8.9–10.3)
CO2: 27 mmol/L (ref 22–32)
Chloride: 107 mmol/L (ref 101–111)
Creatinine, Ser: 1.31 mg/dL — ABNORMAL HIGH (ref 0.61–1.24)
GFR calc non Af Amer: 45 mL/min — ABNORMAL LOW (ref >60)
GFR, EST AFRICAN AMERICAN: 52 mL/min — AB (ref >60)
GLUCOSE: 133 mg/dL — AB (ref 65–99)
POTASSIUM: 3.3 mmol/L — AB (ref 3.5–5.1)
Sodium: 142 mmol/L (ref 135–145)
TOTAL PROTEIN: 6.6 g/dL (ref 6.5–8.1)

## 2017-05-29 LAB — GLUCOSE, CAPILLARY
GLUCOSE-CAPILLARY: 117 mg/dL — AB (ref 65–99)
GLUCOSE-CAPILLARY: 177 mg/dL — AB (ref 65–99)
Glucose-Capillary: 104 mg/dL — ABNORMAL HIGH (ref 65–99)
Glucose-Capillary: 140 mg/dL — ABNORMAL HIGH (ref 65–99)
Glucose-Capillary: 159 mg/dL — ABNORMAL HIGH (ref 65–99)
Glucose-Capillary: 95 mg/dL (ref 65–99)

## 2017-05-29 LAB — TROPONIN I
TROPONIN I: 0.06 ng/mL — AB (ref <0.03)
TROPONIN I: 0.06 ng/mL — AB (ref <0.03)
TROPONIN I: 0.06 ng/mL — AB (ref <0.03)

## 2017-05-29 LAB — PROCALCITONIN: Procalcitonin: 0.12 ng/mL

## 2017-05-29 LAB — LIPID PANEL
CHOL/HDL RATIO: 4.5 ratio
CHOLESTEROL: 122 mg/dL (ref 0–200)
HDL: 27 mg/dL — AB (ref >40)
LDL Cholesterol: 82 mg/dL (ref 0–99)
TRIGLYCERIDES: 67 mg/dL (ref <150)
VLDL: 13 mg/dL (ref 0–40)

## 2017-05-29 LAB — PHOSPHORUS: PHOSPHORUS: 1.9 mg/dL — AB (ref 2.5–4.6)

## 2017-05-29 LAB — MAGNESIUM: MAGNESIUM: 1.8 mg/dL (ref 1.7–2.4)

## 2017-05-29 NOTE — Care Management Note (Signed)
Case Management Note  Patient Details  Name: Marvin Bishop. MRN: [id number] Date of Birth: 1919/11/05  Subjective/Objective:  Pt in with TIA/sepsis. He is from home with family.                   Action/Plan: PT/OT recommending SNF. CSW aware. CM following.  Expected Discharge Date:                  Expected Discharge Plan:  Skilled Nursing Facility  In-House Referral:  Clinical Social Work  Discharge planning Services     Post Acute Care Choice:    Choice offered to:     DME Arranged:    DME Agency:     HH Arranged:    [place] Agency:     Status of Service:  In process, will continue to follow  If discussed at Long Length of Stay Meetings, dates discussed:    Additional Comments:  Pollie Friar, RN 05/29/2017, 1:36 PM

## 2017-05-29 NOTE — Significant Event (Signed)
The radiologist notified me about the patient's severe canal stenosis at C2-C3 noticed n MRI. I discussed with Dr. Cheral Marker neurologist. Appears to be chronic per radiologist and Neurologist. Dr. Cheral Marker advised neurosurgical consult in the morning. Will notify oncoming hospitalist in the morning.  Gean Birchwood

## 2017-05-29 NOTE — Progress Notes (Signed)
Physical Therapy Treatment Patient Details Name: [name] MRN: [id number] DOB: [birth year] Today's Date: 05/29/2017    History of Present Illness Pt is a 83 y.o. male with medical history significant of dementia, diabetes, mellitus, hypertension, hyperlipidemia, history of prostate cancer, and history of PE on chronic anticoagulation. He presented to the ED with R sided weakness and fever. MRI negative for acute infarct.    PT Comments    Patient seen for safety and activity progression. Patient received attempting to climb OOB to use the bathroom. Assisted patient with OOB activity and standing pre gait. Max assist for safety (deferred further ambulation due to need for increased assist) did tolerate side steps to [facility]. Patient extremely confused throughout session. Attempted to reorient multiple times during session were unsuccessful. Assisted patient back to bed and positioned in elevated chair position with safety feature in place. Nsg tech notified for patient feeding. Will continue with current POC.   Follow Up Recommendations  SNF;Supervision/Assistance - 24 hour     Equipment Recommendations   (TBD)    Recommendations for Other Services       Precautions / Restrictions Precautions Precautions: Fall Precaution Comments: Poor coordination Restrictions Weight Bearing Restrictions: No    Mobility  Bed Mobility Overal bed mobility: Needs Assistance Bed Mobility: Supine to Sit;Sit to Supine     Supine to sit: Mod assist Sit to supine: Min assist   General bed mobility comments: Moderate assist to elevate trunk to upright position, patient impulsive with movement, increased physical assist required to keep safe at EOB  Transfers Overall transfer level: Needs assistance Equipment used: 1 person hand held assist (wrap around assist required) Transfers: Sit to/from [facility] Sit to Stand: Mod assist         General transfer comment: Patient  required wrap around physical assist to maintain stability and elevate to upright, difficulty with processing task, truncal ataxia noted  Ambulation/Gait Ambulation/Gait assistance: Max assist Ambulation Distance (Feet): 3 Feet Assistive device:  (physical wrap around support) Gait Pattern/deviations: Step-to pattern     General Gait Details: side steps to [facility], max assist due to ataxia and confusion   Stairs            Wheelchair Mobility    Modified Rankin (Stroke Patients Only)       Balance Overall balance assessment: Needs assistance;History of Falls Sitting-balance support: Feet supported;No upper extremity supported Sitting balance-Leahy Scale: Fair     Standing balance support: Bilateral upper extremity supported;During functional activity Standing balance-Leahy Scale: Poor Standing balance comment: heavy reliance on physical assist                            Cognition Arousal/Alertness: Awake/alert Behavior During Therapy: WFL for tasks assessed/performed Overall Cognitive Status: History of cognitive impairments - at baseline Area of Impairment: Orientation;Attention;Memory;Following commands;Safety/judgement;Awareness;Problem solving                 Orientation Level: Disoriented to;Place;Time;Situation Current Attention Level: Sustained Memory: Decreased short-term memory;Decreased recall of precautions Following Commands: Follows one step commands inconsistently Safety/Judgement: Decreased awareness of safety;Decreased awareness of deficits Awareness: Intellectual Problem Solving: Slow processing;Decreased initiation;Difficulty sequencing;Requires verbal cues;Requires tactile cues General Comments: patient extremely confused throughout session, perseverating on the construction outside the room. Not oriented to anything except self.       Exercises      General Comments        Pertinent Vitals/Pain Faces Pain Scale:  Hurts even  more Pain Location: R neck, shoulder, and side Pain Descriptors / Indicators: Aching;Tightness;[place]                      Prior Function            PT Goals (current goals can now be found in the care plan section) Acute Rehab PT Goals Patient Stated Goal: to go to the bathroom PT Goal Formulation: Patient unable to participate in goal setting Time For Goal Achievement: 06/11/17 Potential to Achieve Goals: Fair    Frequency    Min 2X/week      PT Plan Current plan remains appropriate    Co-evaluation              AM-PAC PT "6 Clicks" Daily Activity  Outcome Measure  Difficulty turning over in bed (including adjusting bedclothes, sheets and blankets)?: Total Difficulty moving from lying on back to sitting on the side of the bed? : Total Difficulty sitting down on and standing up from a chair with arms (e.g., wheelchair, bedside commode, etc,.)?: Total Help needed moving to and from a bed to chair (including a wheelchair)?: A Lot Help needed walking in hospital room?: A Lot Help needed climbing 3-5 steps with a railing? : A Lot 6 Click Score: 9    End of Session Equipment Utilized During Treatment: Gait belt Activity Tolerance: Patient limited by fatigue (limited by arrival of transport) Patient left: in bed;with bed alarm set;with SCD's reapplied (in chair position) Nurse Communication: Mobility status PT Visit Diagnosis: Unsteadiness on feet (R26.81);Other symptoms and signs involving the nervous system (R29.898);Muscle weakness (generalized) (M62.81);Other abnormalities of gait and mobility (R26.89)     Time: 1032-1050 PT Time Calculation (min) (ACUTE ONLY): 18 min  Charges:  $Therapeutic Activity: 8-22 mins                    G Codes:       Alben Deeds, PT DPT  Board Certified Neurologic Specialist [place] 05/29/2017, 11:48 AM

## 2017-05-29 NOTE — Progress Notes (Signed)
Critical lab results, troponin 0.06. MD notified

## 2017-05-29 NOTE — Progress Notes (Signed)
Pt in room refused his MRI

## 2017-05-29 NOTE — Progress Notes (Signed)
PROGRESS NOTE    Marvin Bishop  DDU:[id number] DOB: 01/14/1920 DOA: 05/27/2017 PCP: [name], MD   Brief Narrative:  83 y.o.malewith medical history significant of dementia, diabetes mellitus, hypertension, hyperlipidemia, history of PE on chronic anticoagulation history of prostate cancer and other comorbids who Presented with right-sided weakness started at 10:30 AM. He initially had generalized weakness and overall feeling badly. patient has dementia unable to provide detailed medical history seems that the symptoms have been getting gradually worse until has gotten severely worse at 10:30 AM. He also developed a fever up to 102. He has had decreased by mouth intake patient have had falls in the past today he slid down from the chair. Was worked up for CVA and found to have a PNA and UTI and currently being treated. Patient refused MRI of C Spine and Right Shoulder today.   Assessment & Plan:   Active Problems:   Hypertension   IDDM (insulin dependent diabetes mellitus) (HCC)   Dehydration   Dementia without behavioral disturbance   Chronic combined systolic and diastolic congestive heart failure (HCC)   History of pulmonary embolus (PE)   TIA (transient ischemic attack)   Sepsis (HCC)   CKD (chronic kidney disease), stage III  Right-sided weakness- MRI brain is negative for stroke. MRA brain shows no high-grade stenosis. 3 mm saccular aneurysm noted.  -Neurology is following and has ordered MRI cervical spine to rule out cervical hemicord lesion. However patient has refused -Neuro thinks Right Arm weakness is 2/2 to Adhesive caspulitis/Tendinitis so MRI of Shoulder was ordered but patient refused -PT OT recommending SNF -Appeciate Neuro Recc's  History of pulmonary embolism- -Continue Apixaban  Community-acquired Pneumonia-  -patient was initially started on vancomycin and Zosyn, lactate was elevated 2.37 and improved to 1.58.  -Blood cultures 2 remain negative at 2  Days. If cultures remain negative consider switching to Levaquin.  Acute UTI, poA -Patient's Urinalysis showed Rare Bacteria, Moderate Hb, Small Leukocytes, 6-30 WBC -Urine Cx showed >100,000 CFU of E Coli -C/w Abx as above and de-escalate  Sepsis-  -Sepsis physiology improved and resolved, lactic acid is down to 1.58 as well as WBC improving, likely from pneumonia or UTI. Follow urine culture result sensitivities   Dementia without behavioral disturbance- stable  Hypertension- patient blood pressure is running soft, metoprolol, Lasix currently on hold. Consider restarting his medications if patient blood pressure improves  Diabetes mellitus -CBG's ranging from 117-177.  -Continue sliding scale insulin with NovoLog.  -Levemir 7 units subcutaneous daily.  Chronic systolic and diastolic CHF-  -Lasix on hold, patient started on gentle IV hydration with normal saline at 75 mL/hr.  Follow BMP in a.m. Monitor strict intake and output.  -Troponins were Flat and not indicative of ACS  DVT prophylaxis: Anticoagulated with Apixaban Code Status: FULL CODE Family Communication: No family present at bedside  Disposition Plan: SNF  Consultants:   Neurology   Procedures:  ECHOCARDIOGRAM Study Conclusions  - Left ventricle: The cavity size was normal. There was mild   concentric hypertrophy. Systolic function was normal. The   estimated ejection fraction was in the range of 55% to 60%. Wall   motion was normal; there were no regional wall motion   abnormalities. Doppler parameters are consistent with abnormal   left ventricular relaxation (grade 1 diastolic dysfunction). - Aortic valve: Noncoronary cusp mobility was severely restricted.   There was trivial regurgitation. Valve area (VTI): 2.7 cm^2.   Valve area (Vmax): 2.37 cm^2. Valve area (Vmean): 2.34  cm^2  Bilateral Carotid Duplex Bilateral: No significant (1-39%) ICA stenosis. Antegrade vertebral flow.    Antimicrobials:    Anti-infectives    Start     Dose/Rate Route Frequency Ordered Stop   05/28/17 2000  vancomycin (VANCOCIN) IVPB 1000 mg/200 mL premix     1,000 mg 200 mL/hr over 60 Minutes Intravenous Every 24 hours 05/28/17 0252     05/28/17 0600  piperacillin-tazobactam (ZOSYN) IVPB 3.375 g     3.375 g 12.5 mL/hr over 240 Minutes Intravenous Every 8 hours 05/28/17 0252     05/27/17 2115  piperacillin-tazobactam (ZOSYN) IVPB 3.375 g     3.375 g 100 mL/hr over 30 Minutes Intravenous  Once 05/27/17 2102 05/27/17 2145   05/27/17 2115  vancomycin (VANCOCIN) IVPB 1000 mg/200 mL premix     1,000 mg 200 mL/hr over 60 Minutes Intravenous  Once 05/27/17 2102 05/27/17 2213     Subjective: Seen and examined at bedside and was pleasantly demented. Stated Right shoulder hurt. No CP or SOB.   Objective: Vitals:   05/29/17 0145 05/29/17 0455 05/29/17 0928 05/29/17 1359  BP: (!) 135/59 131/70 127/67 (!) 136/59  Pulse: 88 (!) 107 91 88  Resp: 20 20 18 20   Temp: 98.4 F (36.9 C) 98 F (36.7 C) 98.3 F (36.8 C) 97.8 F (36.6 C)  TempSrc: Oral Oral Oral Oral  SpO2: 100% 100% 98% 100%  Weight:      Height:        Intake/Output Summary (Last 24 hours) at 05/29/17 2047 Last data filed at 05/29/17 0400  Gross per 24 hour  Intake              950 ml  Output                0 ml  Net              950 ml   Filed Weights   05/27/17 2039 05/28/17 0239  Weight: 97.5 kg (215 lb) 99.3 kg (218 lb 14.4 oz)   Examination: Physical Exam:  Constitutional: WN/WD, NAD and appears calm and comfortable Eyes: Right eye missing. Left eye had no icterus  ENMT: External Ears, Nose appear normal. Grossly normal hearing. Mucous membranes are moist.   Neck: Appears normal, supple, no cervical masses, normal ROM, no appreciable thyromegaly Respiratory: Diminished to auscultation bilaterally, no wheezing, rales, rhonchi or crackles. Normal respiratory effort and patient is not tachypenic. No accessory muscle use.   Cardiovascular: RRR, no murmurs / rubs / gallops. S1 and S2 auscultated. No extremity edema. Abdomen: Soft, non-tender, non-distended. No masses palpated. No appreciable hepatosplenomegaly. Bowel sounds positive.  GU: Deferred. Musculoskeletal: No clubbing / cyanosis of digits/nails. No joint deformity upper and lower extremities. Had mild weakness in RUE  Skin: No rashes, lesions, ulcers on a limited skin eval. No induration; Warm and dry.  Neurologic: No appreciable focal deficits except mild weakness. Sensation appeared intact in all 4 Extremities. Romberg sign cerebellar reflexes not assessed.  Psychiatric: Impaired judgment and insight. Alert but not oriented x 3. Normal mood and appropriate affect.   Data Reviewed: I have personally reviewed following labs and imaging studies  CBC:  Recent Labs Lab 05/27/17 2049 05/29/17 1120  WBC 14.4* 9.9  NEUTROABS 11.5* 7.6  HGB 13.6 12.4*  HCT 39.3 36.3*  MCV 80.4 80.1  PLT 217 626   Basic Metabolic Panel:  Recent Labs Lab 05/27/17 2049 05/29/17 1120  NA 139 142  K 3.7 3.3*  CL  101 107  CO2 27 27  GLUCOSE 290* 133*  BUN 30* 16  CREATININE 1.87* 1.31*  CALCIUM 9.2 8.6*  MG  --  1.8  PHOS  --  1.9*   GFR: Estimated Creatinine Clearance: 40.3 mL/min (A) (by C-G formula based on SCr of 1.31 mg/dL (H)). Liver Function Tests:  Recent Labs Lab 05/27/17 2049 05/29/17 1120  AST 21 25  ALT 17 18  ALKPHOS 73 54  BILITOT 0.9 1.2  PROT 7.5 6.6  ALBUMIN 3.6 2.7*   No results for input(s): LIPASE, AMYLASE in the last 168 hours. No results for input(s): AMMONIA in the last 168 hours. Coagulation Profile:  Recent Labs Lab 05/27/17 2049  INR 2.33   Cardiac Enzymes:  Recent Labs Lab 05/29/17 1120 05/29/17 1412 05/29/17 1911  TROPONINI 0.06* 0.06* 0.06*   BNP (last 3 results) No results for input(s): PROBNP in the last 8760 hours. HbA1C: No results for input(s): HGBA1C in the last 72 hours. CBG:  Recent  Labs Lab 05/29/17 0454 05/29/17 0812 05/29/17 1145 05/29/17 1711 05/29/17 1942  GLUCAP 117* 95 140* 159* 177*   Lipid Profile:  Recent Labs  05/29/17 1120  CHOL 122  HDL 27*  LDLCALC 82  TRIG 67  CHOLHDL 4.5   Thyroid Function Tests: No results for input(s): TSH, T4TOTAL, FREET4, T3FREE, THYROIDAB in the last 72 hours. Anemia Panel: No results for input(s): VITAMINB12, FOLATE, FERRITIN, TIBC, IRON, RETICCTPCT in the last 72 hours. Sepsis Labs:  Recent Labs Lab 05/27/17 2115 05/28/17 0013 05/29/17 1120  PROCALCITON  --   --  0.12  LATICACIDVEN 2.37* 1.58  --     Recent Results (from the past 240 hour(s))  Culture, blood (Routine x 2)     Status: None (Preliminary result)   Collection Time: 05/27/17  8:51 PM  Result Value Ref Range Status   Specimen Description BLOOD RIGHT HAND  Final   Special Requests   Final    BOTTLES DRAWN AEROBIC AND ANAEROBIC Blood Culture adequate volume   Culture   Final    NO GROWTH 2 DAYS Performed at [facility], [place] [address], [place], [place] [address]    Report Status PENDING  Incomplete  Culture, blood (Routine x 2)     Status: None (Preliminary result)   Collection Time: 05/27/17  9:06 PM  Result Value Ref Range Status   Specimen Description BLOOD LEFT ARM  Final   Special Requests   Final    BOTTLES DRAWN AEROBIC AND ANAEROBIC Blood Culture adequate volume   Culture   Final    NO GROWTH 2 DAYS Performed at [facility], [address] [place], [place] [address]    Report Status PENDING  Incomplete  Urine culture     Status: Abnormal (Preliminary result)   Collection Time: 05/27/17  9:20 PM  Result Value Ref Range Status   Specimen Description URINE, CLEAN CATCH  Final   Special Requests NONE  Final   Culture (A)  Final    >=100,000 COLONIES/mL ESCHERICHIA COLI SUSCEPTIBILITIES TO FOLLOW Performed at [facility], [address] [place], [place] [address]    Report Status PENDING  Incomplete     Radiology Studies: Dg Chest 2 View  Result Date: 05/27/2017 CLINICAL DATA:  Increasing weakness EXAM: CHEST  2 VIEW COMPARISON:  01/27/2017 FINDINGS: Mild increased opacity at the right base. No pleural effusion. Stable cardiomediastinal silhouette with atherosclerosis. No pneumothorax. Degenerative changes of the spine. IMPRESSION: Mild  right basilar atelectasis or infiltrate. Electronically Signed   By: Donavan Foil M.D.   On: 05/27/2017 21:40   Ct Head Wo Contrast  Result Date: 05/27/2017 CLINICAL DATA:  Acute onset of worsening generalized weakness and difficulty walking. Bilateral hand pain. Initial encounter. EXAM: CT HEAD WITHOUT CONTRAST TECHNIQUE: Contiguous axial images were obtained from the base of the skull through the vertex without intravenous contrast. COMPARISON:  CT of the head performed 01/27/2017 FINDINGS: Brain: No evidence of acute infarction, hemorrhage, hydrocephalus, extra-axial collection or mass lesion/mass effect. Prominence of the ventricles and sulci reflects moderate cortical volume loss. Cerebellar atrophy is noted. Diffuse periventricular and subcortical white matter change likely reflects small vessel ischemic microangiopathy. The brainstem and fourth ventricle are within normal limits. The basal ganglia are unremarkable in appearance. The cerebral hemispheres demonstrate grossly normal gray-white differentiation. No mass effect or midline shift is seen. Vascular: No hyperdense vessel or unexpected calcification. Skull: There is no evidence of fracture; visualized osseous structures are unremarkable in appearance. Sinuses/Orbits: Postoperative change is noted at the right optic globe. The visualized portions of the left orbit are unremarkable in appearance. The paranasal sinuses and mastoid air cells are well-aerated. Other: No significant soft tissue abnormalities are seen. IMPRESSION: 1. No acute intracranial pathology seen on CT. 2. Moderate cortical volume loss and  diffuse small vessel ischemic microangiopathy. Electronically Signed   By: Garald Balding M.D.   On: 05/27/2017 22:04   Mr Brain Wo Contrast  Result Date: 05/28/2017 CLINICAL DATA:  83 y/o M; right-sided weakness and fever. TIA, initial exam. EXAM: MRI HEAD WITHOUT CONTRAST MRA HEAD WITHOUT CONTRAST TECHNIQUE: Multiplanar, multiecho pulse sequences of the brain and surrounding structures were obtained without intravenous contrast. Angiographic images of the head were obtained using MRA technique without contrast. COMPARISON:  05/27/2017 CT head. FINDINGS: MRI HEAD FINDINGS Brain: No acute infarction, hemorrhage, hydrocephalus, extra-axial collection or mass lesion. Patchy nonspecific foci of T2 FLAIR hyperintense signal abnormality in periventricular white matter is compatible with moderate chronic microvascular ischemic changes for age. Advanced brain parenchymal volume loss. Vascular: As below. Skull and upper cervical spine: Normal marrow signal. Sinuses/Orbits: Negative. Other: None. MRA HEAD FINDINGS Motion degraded study, suboptimal evaluation for subtle stenosis or small aneurysm. Anterior circulation: No large vessel occlusion or high-grade stenosis. 3 mm superiorly directed outpouching of the right cavernous ICA probably representing a small saccular aneurysm (series 3, image 118). No additional aneurysm identified. Posterior circulation left dominant vertebrobasilar system. No large vessel occlusion or high-grade stenosis. Anatomic variant: Anterior communicating artery and right posterior communicating arteries are present. No left posterior communicating artery identified, likely hypoplastic or absent. IMPRESSION: MRI head: 1. No acute intracranial abnormality identified. 2. Moderate for age chronic microvascular ischemic changes and advanced parenchymal volume loss of the brain. MRI head: 1. Motion degraded study, suboptimal evaluation for subtle stenosis or small aneurysm. 2. Patent circle of  Willis. No large vessel occlusion or high-grade stenosis. 3. 3 mm outpouching superiorly directed from right cavernous ICA, probably a small saccular aneurysm. Electronically Signed   By: Kristine Garbe M.D.   On: 05/28/2017 05:09   Mr Jodene Nam Head Wo Contrast  Result Date: 05/28/2017 CLINICAL DATA:  83 y/o M; right-sided weakness and fever. TIA, initial exam. EXAM: MRI HEAD WITHOUT CONTRAST MRA HEAD WITHOUT CONTRAST TECHNIQUE: Multiplanar, multiecho pulse sequences of the brain and surrounding structures were obtained without intravenous contrast. Angiographic images of the head were obtained using MRA technique without contrast. COMPARISON:  05/27/2017 CT head. FINDINGS: MRI  HEAD FINDINGS Brain: No acute infarction, hemorrhage, hydrocephalus, extra-axial collection or mass lesion. Patchy nonspecific foci of T2 FLAIR hyperintense signal abnormality in periventricular white matter is compatible with moderate chronic microvascular ischemic changes for age. Advanced brain parenchymal volume loss. Vascular: As below. Skull and upper cervical spine: Normal marrow signal. Sinuses/Orbits: Negative. Other: None. MRA HEAD FINDINGS Motion degraded study, suboptimal evaluation for subtle stenosis or small aneurysm. Anterior circulation: No large vessel occlusion or high-grade stenosis. 3 mm superiorly directed outpouching of the right cavernous ICA probably representing a small saccular aneurysm (series 3, image 118). No additional aneurysm identified. Posterior circulation left dominant vertebrobasilar system. No large vessel occlusion or high-grade stenosis. Anatomic variant: Anterior communicating artery and right posterior communicating arteries are present. No left posterior communicating artery identified, likely hypoplastic or absent. IMPRESSION: MRI head: 1. No acute intracranial abnormality identified. 2. Moderate for age chronic microvascular ischemic changes and advanced parenchymal volume loss of the  brain. MRI head: 1. Motion degraded study, suboptimal evaluation for subtle stenosis or small aneurysm. 2. Patent circle of Willis. No large vessel occlusion or high-grade stenosis. 3. 3 mm outpouching superiorly directed from right cavernous ICA, probably a small saccular aneurysm. Electronically Signed   By: Kristine Garbe M.D.   On: 05/28/2017 05:09   Scheduled Meds: . apixaban  5 mg Oral BID  . gabapentin  200 mg Oral QHS  . insulin aspart  0-9 Units Subcutaneous Q4H  . insulin detemir  7 Units Subcutaneous QHS  . memantine  10 mg Oral BID  . pantoprazole  40 mg Oral Daily  . simvastatin  40 mg Oral q morning - 10a   Continuous Infusions: . sodium chloride 1,000 mL (05/28/17 0511)  . sodium chloride 75 mL/hr at 05/29/17 0542  . piperacillin-tazobactam (ZOSYN)  IV 3.375 g (05/29/17 1400)  . vancomycin 1,000 mg (05/29/17 2023)    LOS: 2 days   Kerney Elbe, DO [facility] Pager [phone]  If 7PM-7AM, please contact night-coverage [email or web address] Password [facility] 05/29/2017, 8:47 PM

## 2017-05-30 DIAGNOSIS — B9620 Unspecified Escherichia coli [E. coli] as the cause of diseases classified elsewhere: Secondary | ICD-10-CM

## 2017-05-30 DIAGNOSIS — N390 Urinary tract infection, site not specified: Secondary | ICD-10-CM

## 2017-05-30 LAB — GLUCOSE, CAPILLARY
GLUCOSE-CAPILLARY: 79 mg/dL (ref 65–99)
GLUCOSE-CAPILLARY: 67 mg/dL (ref 65–99)
GLUCOSE-CAPILLARY: 91 mg/dL (ref 65–99)
GLUCOSE-CAPILLARY: 162 mg/dL — AB (ref 65–99)
Glucose-Capillary: 133 mg/dL — ABNORMAL HIGH (ref 65–99)
Glucose-Capillary: 225 mg/dL — ABNORMAL HIGH (ref 65–99)

## 2017-05-30 LAB — COMPREHENSIVE METABOLIC PANEL
ALBUMIN: 2.5 g/dL — AB (ref 3.5–5.0)
ALT: 14 U/L — ABNORMAL LOW (ref 17–63)
ANION GAP: 5 (ref 5–15)
AST: 20 U/L (ref 15–41)
Alkaline Phosphatase: 51 U/L (ref 38–126)
BUN: 11 mg/dL (ref 6–20)
CHLORIDE: 108 mmol/L (ref 101–111)
CO2: 29 mmol/L (ref 22–32)
Calcium: 8.5 mg/dL — ABNORMAL LOW (ref 8.9–10.3)
Creatinine, Ser: 1.26 mg/dL — ABNORMAL HIGH (ref 0.61–1.24)
GFR calc Af Amer: 54 mL/min — ABNORMAL LOW (ref >60)
GFR calc non Af Amer: 47 mL/min — ABNORMAL LOW (ref >60)
GLUCOSE: 84 mg/dL (ref 65–99)
POTASSIUM: 3.5 mmol/L (ref 3.5–5.1)
SODIUM: 142 mmol/L (ref 135–145)
Total Bilirubin: 1.1 mg/dL (ref 0.3–1.2)
Total Protein: 6 g/dL — ABNORMAL LOW (ref 6.5–8.1)

## 2017-05-30 LAB — URINE CULTURE: Culture: >=100000 — AB

## 2017-05-30 LAB — CBC WITH DIFFERENTIAL/PLATELET
BASOS PCT: 0 %
Basophils Absolute: 0 10*3/uL (ref 0.0–0.1)
EOS ABS: 0.2 10*3/uL (ref 0.0–0.7)
EOS PCT: 2 %
HCT: 34.9 % — ABNORMAL LOW (ref 39.0–52.0)
HEMOGLOBIN: 11.6 g/dL — AB (ref 13.0–17.0)
Lymphocytes Relative: 18 %
Lymphs Abs: 1.6 10*3/uL (ref 0.7–4.0)
MCH: 27.2 pg (ref 26.0–34.0)
MCHC: 33.2 g/dL (ref 30.0–36.0)
MCV: 81.7 fL (ref 78.0–100.0)
MONO ABS: 1.1 10*3/uL — AB (ref 0.1–1.0)
MONOS PCT: 12 %
NEUTROS PCT: 68 %
Neutro Abs: 5.9 10*3/uL (ref 1.7–7.7)
PLATELETS: 213 10*3/uL (ref 150–400)
RBC: 4.27 MIL/uL (ref 4.22–5.81)
RDW: 15.3 % (ref 11.5–15.5)
WBC: 8.8 10*3/uL (ref 4.0–10.5)

## 2017-05-30 LAB — MAGNESIUM: Magnesium: 1.7 mg/dL (ref 1.7–2.4)

## 2017-05-30 LAB — PHOSPHORUS: PHOSPHORUS: 2.4 mg/dL — AB (ref 2.5–4.6)

## 2017-05-30 NOTE — Progress Notes (Signed)
Occupational Therapy Treatment Patient Details Name: Marvin BUTT Sr. MRN: [id number] DOB: 07/20/1920 Today's Date: 05/30/2017    History of present illness Pt is a 83 y.o. male with medical history significant of dementia, diabetes, mellitus, hypertension, hyperlipidemia, history of prostate cancer, and history of PE on chronic anticoagulation. He presented to the ED with R sided weakness and fever. MRI negative for acute infarct.   OT comments  Pt demonstrating improved cognition this session, pleasant and willing to work with therapy this session. Pt able to perform simulated toilet transfer and seated grooming tasks. Pt continues to require increased levels of support, and will continue to benefit from skilled OT in the acute setting and at the venue below to maximize safety and independence in ADL and functional transfers.    Follow Up Recommendations  SNF;Supervision/Assistance - 24 hour    Equipment Recommendations  Other (comment) (defer to next venue)    Recommendations for Other Services      Precautions / Restrictions Precautions Precautions: Fall Precaution Comments: Poor coordination Restrictions Weight Bearing Restrictions: No       Mobility Bed Mobility Overal bed mobility: Needs Assistance Bed Mobility: Supine to Sit     Supine to sit: Mod assist;HOB elevated     General bed mobility comments: Moderate assist to elevate trunk to upright position and for verbal cues sequencing motor pattern, patient impulsive with movement, increased physical assist required to keep safe at EOB  Transfers Overall transfer level: Needs assistance Equipment used: 2 person hand held assist Transfers: Sit to/from [facility] Sit to Stand: Mod assist;+2 physical assistance Stand pivot transfers: Max assist;+2 physical assistance       General transfer comment: truncal ataxia noted and required +2 physical assist as well as multimodal cues for sequencing  transfers    Balance Overall balance assessment: Needs assistance;History of Falls Sitting-balance support: Feet supported;No upper extremity supported Sitting balance-Leahy Scale: Fair Sitting balance - Comments: impulsive sitting EOB, able to maintain sitting without assist, but not safe to be left alone   Standing balance support: During functional activity;Bilateral upper extremity supported Standing balance-Leahy Scale: Poor Standing balance comment: heavy reliance on physical assist                           ADL either performed or assessed with clinical judgement   ADL Overall ADL's : Needs assistance/impaired Eating/Feeding: Supervision/ safety;Sitting;With adaptive utensils Eating/Feeding Details (indicate cue type and reason): with red built up handle, Pt able to participate in self-feeding with increased time and effort. Pt expressing that he was not hungry and willing to demonstrate feeding techniques anyways.  Grooming: Sitting;Wash/dry face;Wash/dry hands;Set up Grooming Details (indicate cue type and reason): in recliner                 Toilet Transfer: Moderate assistance;+2 for physical assistance;Comfort height toilet Toilet Transfer Details (indicate cue type and reason): no RW used as the one in the room was too short; Pt expressing much discomfort with transfer and pain throughout right side         Functional mobility during ADLs: Maximal assistance;+2 for physical assistance (stand pivot only) General ADL Comments: Pt continues with signficant motor planning deficits impacting safety.      Vision       Perception     Praxis      Cognition Arousal/Alertness: Awake/alert Behavior During Therapy: WFL for tasks assessed/performed Overall Cognitive Status: History of cognitive impairments -  at baseline Area of Impairment: Attention;Following commands;Safety/judgement;Awareness;Problem solving                   Current Attention  Level: Sustained   Following Commands: Follows one step commands inconsistently Safety/Judgement: Decreased awareness of safety;Decreased awareness of deficits Awareness: Intellectual Problem Solving: Slow processing;Decreased initiation;Difficulty sequencing;Requires verbal cues;Requires tactile cues General Comments: Pt with improved cognition this session, continues to require multi-modal cues throughout session        Exercises     Shoulder Instructions       General Comments Pt's paid caregiver present during session    Pertinent Vitals/ Pain       Pain Assessment: Faces Faces Pain Scale: Hurts whole lot Pain Location: R neck, shoulder, and side Pain Descriptors / Indicators: Aching;Tightness;Sharp Pain Intervention(s): Monitored during session;Repositioned  Home Living                                          Prior Functioning/Environment              Frequency  Min 2X/week        Progress Toward Goals  OT Goals(current goals can now be found in the care plan section)  Progress towards OT goals: Progressing toward goals  Acute Rehab OT Goals Patient Stated Goal: To take a nap OT Goal Formulation: With patient Time For Goal Achievement: 06/11/17 Potential to Achieve Goals: Good  Plan Discharge plan remains appropriate;Frequency remains appropriate    Co-evaluation                 AM-PAC PT "6 Clicks" Daily Activity     Outcome Measure   Help from another person eating meals?: A Little Help from another person taking care of personal grooming?: A Little Help from another person toileting, which includes using toliet, bedpan, or urinal?: A Lot Help from another person bathing (including washing, rinsing, drying)?: A Lot Help from another person to put on and taking off regular upper body clothing?: A Little Help from another person to put on and taking off regular lower body clothing?: A Lot 6 Click Score: 15    End of  Session Equipment Utilized During Treatment: Gait belt  OT Visit Diagnosis: Unsteadiness on feet (R26.81);Muscle weakness (generalized) (M62.81);Ataxia, unspecified (R27.0)   Activity Tolerance Patient tolerated treatment well   Patient Left in chair;with call bell/phone within reach;with chair alarm set;Other (comment) (Pt's typical paid caregiver)   Nurse Communication Mobility status;Need for lift equipment        Time: 1327-1350 OT Time Calculation (min): 23 min  Charges: OT General Charges $OT Visit: 1 Procedure OT Treatments $Self Care/Home Management : 23-37 mins  Hulda Humphrey OTR/L [place] 05/30/2017, 3:51 PM

## 2017-05-30 NOTE — Progress Notes (Signed)
CSW met with patient's daughter and caregiver at bedside to discuss bed offers. CSW indicated that [facility] would not have any beds available until later next week, so patient's daughter would need to select another facility. Patient's daughter requested information on bed availability at [facility] or at [facility]. CSW contacted facilities and obtained bed availability at both facilities. CSW to follow up with patient's daughter to provide her with information and have her select a preference of facility for discharge.  CSW will continue to follow to facilitate discharge to SNF when medically ready.  Laveda Abbe, [place] Clinical Social Worker [phone]

## 2017-05-30 NOTE — Consult Note (Addendum)
Reason for Consult:Quadraparesis Referring Physician: Dr. Darrold Span Sr. is an 83 y.o. male.  HPI: the patient is a 83 year old male who was brought in because of progressive weakness. He was initially thought that he might have a stroke. CT and MR of the brain revealed no evidence of a stroke however was noted that he had severe spondylitic disease in the neck. An MRI of the cervical spine demonstrates patient has severe stenosis at C2-3 with a canal that measures 2 mm in AP dimension. There is both degeneration of the disc in addition to calcification in the posterior ligament between C2 and C3 that is aggravating this process. This is noted on a CT scan obtained of the same region. I have been asked to see the patient regarding consideration of surgical intervention.when I interviewed the patient notes that he is having minimal weakness is not having any pain he feels that he can take care of himself and can feed himself though he has noted that he's had some unsteadiness with walking. I introduced the concept of surgery and he flatly said he's not interested. I then contacted his daughter and this afternoon at 3 PM we had a conference in his room to discuss the question of surgery with his daughter [name] and Neoma Laming by phone. After discussing the pros and cons of surgery and particularly the risks and the fact that he had significant medical deterioration after surgery 2 years ago for an eye extraction it was decided that he would not want to expose himself nor his daughters would want to expose him to the risks of surgery though there might be some gain from it. They're concerned that the risk may be too great. I am in agreement with her decision. They will pursue a conservative course of treatment with physical therapy and occupational therapy to the extent that it will help.  Past Medical History:  Diagnosis Date  . Arthritis   . Asthma    hx of as child  . Complete loss of vision     right eye  . Dementia   . Diabetes mellitus   . Headache(784.0)    slight  . Hypercholesteremia   . Hypertension   . Insomnia    occasional  . Memory loss   . Nephrolithiasis    right UVJ stone  . Peripheral neuropathy    both legs  . PONV (postoperative nausea and vomiting)   . Prostate cancer [facility])     Past Surgical History:  Procedure Laterality Date  . APPENDECTOMY    . Cornea removed Right 02-26-15  . CYSTOSCOPY W/ URETERAL STENT PLACEMENT Right 10/01/2013   Procedure: cystoscopy right retrograde holmium laser lithotripsy;  Surgeon: Franchot Gallo, MD;  Location: [facility];  Service: Urology;  Laterality: Right;  holmium laser application  . HEMORROIDECTOMY    . KNEE ARTHROSCOPY Right   . PARS PLANA VITRECTOMY  10/24/2011   Procedure: PARS PLANA VITRECTOMY WITH 23 GAUGE;  Surgeon: Adonis Brook, MD;  Location: [place];  Service: Ophthalmology;  Laterality: Right;  . TONSILLECTOMY  as teen    Family History  Problem Relation Age of Onset  . Cancer Mother        uterus  . Heart Problems Father   . Cancer Brother        stomach  . Cancer Brother        throat  . Colon cancer Brother     Social History:  reports that he quit smoking  about 50 years ago. His smoking use included Cigarettes. He smoked 0.25 packs per day. He has never used smokeless tobacco. He reports that he drinks alcohol. He reports that he does not use drugs.  Allergies:  Allergies  Allergen Reactions  . Donepezil Nausea And Vomiting  . Hydrocodone Nausea And Vomiting    Medications: I have reviewed the patient's current medications.  Results for orders placed or performed during the hospital encounter of 05/27/17 (from the past 48 hour(s))  Glucose, capillary     Status: Abnormal   Collection Time: 05/28/17  5:01 PM  Result Value Ref Range   Glucose-Capillary 135 (H) 65 - 99 mg/dL   Comment 1 Notify RN    Comment 2 Document in Chart   Glucose, capillary     Status: Abnormal   Collection  Time: 05/28/17  8:08 PM  Result Value Ref Range   Glucose-Capillary 185 (H) 65 - 99 mg/dL   Comment 1 Notify RN    Comment 2 Document in Chart   Glucose, capillary     Status: Abnormal   Collection Time: 05/28/17 11:56 PM  Result Value Ref Range   Glucose-Capillary 158 (H) 65 - 99 mg/dL   Comment 1 Notify RN    Comment 2 Document in Chart   Glucose, capillary     Status: Abnormal   Collection Time: 05/29/17  4:54 AM  Result Value Ref Range   Glucose-Capillary 117 (H) 65 - 99 mg/dL   Comment 1 Notify RN    Comment 2 Document in Chart   Glucose, capillary     Status: None   Collection Time: 05/29/17  8:12 AM  Result Value Ref Range   Glucose-Capillary 95 65 - 99 mg/dL  CBC with Differential/Platelet     Status: Abnormal   Collection Time: 05/29/17 11:20 AM  Result Value Ref Range   WBC 9.9 4.0 - 10.5 K/uL   RBC 4.53 4.22 - 5.81 MIL/uL   Hemoglobin 12.4 (L) 13.0 - 17.0 g/dL   HCT 36.3 (L) 39.0 - 52.0 %   MCV 80.1 78.0 - 100.0 fL   MCH 27.4 26.0 - 34.0 pg   MCHC 34.2 30.0 - 36.0 g/dL   RDW 14.9 11.5 - 15.5 %   Platelets 193 150 - 400 K/uL   Neutrophils Relative % 78 %   Neutro Abs 7.6 1.7 - 7.7 K/uL   Lymphocytes Relative 9 %   Lymphs Abs 0.9 0.7 - 4.0 K/uL   Monocytes Relative 12 %   Monocytes Absolute 1.2 (H) 0.1 - 1.0 K/uL   Eosinophils Relative 1 %   Eosinophils Absolute 0.1 0.0 - 0.7 K/uL   Basophils Relative 0 %   Basophils Absolute 0.0 0.0 - 0.1 K/uL  Comprehensive metabolic panel     Status: Abnormal   Collection Time: 05/29/17 11:20 AM  Result Value Ref Range   Sodium 142 135 - 145 mmol/L   Potassium 3.3 (L) 3.5 - 5.1 mmol/L   Chloride 107 101 - 111 mmol/L   CO2 27 22 - 32 mmol/L   Glucose, Bld 133 (H) 65 - 99 mg/dL   BUN 16 6 - 20 mg/dL   Creatinine, Ser 1.31 (H) 0.61 - 1.24 mg/dL   Calcium 8.6 (L) 8.9 - 10.3 mg/dL   Total Protein 6.6 6.5 - 8.1 g/dL   Albumin 2.7 (L) 3.5 - 5.0 g/dL   AST 25 15 - 41 U/L   ALT 18 17 - 63 U/L   Alkaline Phosphatase  54 38 -  126 U/L   Total Bilirubin 1.2 0.3 - 1.2 mg/dL   GFR calc non Af Amer 45 (L) >60 mL/min   GFR calc Af Amer 52 (L) >60 mL/min    Comment: (NOTE) The eGFR has been calculated using the CKD EPI equation. This calculation has not been validated in all clinical situations. eGFR's persistently <60 mL/min signify possible Chronic Kidney Disease.    Anion gap 8 5 - 15  Lipid panel     Status: Abnormal   Collection Time: 05/29/17 11:20 AM  Result Value Ref Range   Cholesterol 122 0 - 200 mg/dL   Triglycerides 67 <150 mg/dL   HDL 27 (L) >40 mg/dL   Total CHOL/HDL Ratio 4.5 RATIO   VLDL 13 0 - 40 mg/dL   LDL Cholesterol 82 0 - 99 mg/dL    Comment:        Total Cholesterol/HDL:CHD Risk Coronary Heart Disease Risk Table                     Men   Women  1/2 Average Risk   3.4   3.3  Average Risk       5.0   4.4  2 X Average Risk   9.6   7.1  3 X Average Risk  23.4   11.0        Use the calculated Patient Ratio above and the CHD Risk Table to determine the patient's CHD Risk.        ATP III CLASSIFICATION (LDL):  <100     mg/dL   Optimal  100-129  mg/dL   Near or Above                    Optimal  130-159  mg/dL   Borderline  160-189  mg/dL   High  >190     mg/dL   Very High   Magnesium     Status: None   Collection Time: 05/29/17 11:20 AM  Result Value Ref Range   Magnesium 1.8 1.7 - 2.4 mg/dL  Procalcitonin     Status: None   Collection Time: 05/29/17 11:20 AM  Result Value Ref Range   Procalcitonin 0.12 ng/mL    Comment:        Interpretation: PCT (Procalcitonin) <= 0.5 ng/mL: Systemic infection (sepsis) is not likely. Local bacterial infection is possible. (NOTE)         ICU PCT Algorithm               Non ICU PCT Algorithm    ----------------------------     ------------------------------         PCT < 0.25 ng/mL                 PCT < 0.1 ng/mL     Stopping of antibiotics            Stopping of antibiotics       strongly encouraged.               strongly encouraged.     ----------------------------     ------------------------------       PCT level decrease by               PCT < 0.25 ng/mL       >= 80% from peak PCT       OR PCT 0.25 - 0.5 ng/mL          Stopping  of antibiotics                                             encouraged.     Stopping of antibiotics           encouraged.    ----------------------------     ------------------------------       PCT level decrease by              PCT >= 0.25 ng/mL       < 80% from peak PCT        AND PCT >= 0.5 ng/mL            Continuin g antibiotics                                              encouraged.       Continuing antibiotics            encouraged.    ----------------------------     ------------------------------     PCT level increase compared          PCT > 0.5 ng/mL         with peak PCT AND          PCT >= 0.5 ng/mL             Escalation of antibiotics                                          strongly encouraged.      Escalation of antibiotics        strongly encouraged.   Phosphorus     Status: Abnormal   Collection Time: 05/29/17 11:20 AM  Result Value Ref Range   Phosphorus 1.9 (L) 2.5 - 4.6 mg/dL  Troponin I     Status: Abnormal   Collection Time: 05/29/17 11:20 AM  Result Value Ref Range   Troponin I 0.06 (HH) <0.03 ng/mL    Comment: CRITICAL RESULT CALLED TO, READ BACK BY AND VERIFIED WITH: IJAOLA,O RN @ [id number] 05/29/17 LEONARD,A   Glucose, capillary     Status: Abnormal   Collection Time: 05/29/17 11:45 AM  Result Value Ref Range   Glucose-Capillary 140 (H) 65 - 99 mg/dL  Troponin I (q 6hr x 3)     Status: Abnormal   Collection Time: 05/29/17  2:12 PM  Result Value Ref Range   Troponin I 0.06 (HH) <0.03 ng/mL    Comment: CRITICAL VALUE NOTED.  VALUE IS CONSISTENT WITH PREVIOUSLY REPORTED AND CALLED VALUE.  Glucose, capillary     Status: Abnormal   Collection Time: 05/29/17  5:11 PM  Result Value Ref Range   Glucose-Capillary 159 (H) 65 - 99 mg/dL  Troponin I (q 6hr x 3)      Status: Abnormal   Collection Time: 05/29/17  7:11 PM  Result Value Ref Range   Troponin I 0.06 (HH) <0.03 ng/mL    Comment: CRITICAL VALUE NOTED.  VALUE IS CONSISTENT WITH PREVIOUSLY REPORTED AND CALLED VALUE.  Glucose, capillary     Status: Abnormal   Collection Time: 05/29/17  7:42 PM  Result Value Ref Range  Glucose-Capillary 177 (H) 65 - 99 mg/dL   Comment 1 Notify RN    Comment 2 Document in Chart   Glucose, capillary     Status: Abnormal   Collection Time: 05/29/17 11:36 PM  Result Value Ref Range   Glucose-Capillary 104 (H) 65 - 99 mg/dL   Comment 1 Notify RN    Comment 2 Document in Chart   Glucose, capillary     Status: None   Collection Time: 05/30/17  3:49 AM  Result Value Ref Range   Glucose-Capillary 79 65 - 99 mg/dL   Comment 1 Notify RN    Comment 2 Document in Chart   CBC with Differential/Platelet     Status: Abnormal   Collection Time: 05/30/17  4:40 AM  Result Value Ref Range   WBC 8.8 4.0 - 10.5 K/uL   RBC 4.27 4.22 - 5.81 MIL/uL   Hemoglobin 11.6 (L) 13.0 - 17.0 g/dL   HCT 34.9 (L) 39.0 - 52.0 %   MCV 81.7 78.0 - 100.0 fL   MCH 27.2 26.0 - 34.0 pg   MCHC 33.2 30.0 - 36.0 g/dL   RDW 15.3 11.5 - 15.5 %   Platelets 213 150 - 400 K/uL   Neutrophils Relative % 68 %   Neutro Abs 5.9 1.7 - 7.7 K/uL   Lymphocytes Relative 18 %   Lymphs Abs 1.6 0.7 - 4.0 K/uL   Monocytes Relative 12 %   Monocytes Absolute 1.1 (H) 0.1 - 1.0 K/uL   Eosinophils Relative 2 %   Eosinophils Absolute 0.2 0.0 - 0.7 K/uL   Basophils Relative 0 %   Basophils Absolute 0.0 0.0 - 0.1 K/uL  Comprehensive metabolic panel     Status: Abnormal   Collection Time: 05/30/17  4:40 AM  Result Value Ref Range   Sodium 142 135 - 145 mmol/L   Potassium 3.5 3.5 - 5.1 mmol/L   Chloride 108 101 - 111 mmol/L   CO2 29 22 - 32 mmol/L   Glucose, Bld 84 65 - 99 mg/dL   BUN 11 6 - 20 mg/dL   Creatinine, Ser 1.26 (H) 0.61 - 1.24 mg/dL   Calcium 8.5 (L) 8.9 - 10.3 mg/dL   Total Protein 6.0 (L) 6.5  - 8.1 g/dL   Albumin 2.5 (L) 3.5 - 5.0 g/dL   AST 20 15 - 41 U/L   ALT 14 (L) 17 - 63 U/L   Alkaline Phosphatase 51 38 - 126 U/L   Total Bilirubin 1.1 0.3 - 1.2 mg/dL   GFR calc non Af Amer 47 (L) >60 mL/min   GFR calc Af Amer 54 (L) >60 mL/min    Comment: (NOTE) The eGFR has been calculated using the CKD EPI equation. This calculation has not been validated in all clinical situations. eGFR's persistently <60 mL/min signify possible Chronic Kidney Disease.    Anion gap 5 5 - 15  Magnesium     Status: None   Collection Time: 05/30/17  4:40 AM  Result Value Ref Range   Magnesium 1.7 1.7 - 2.4 mg/dL  Phosphorus     Status: Abnormal   Collection Time: 05/30/17  4:40 AM  Result Value Ref Range   Phosphorus 2.4 (L) 2.5 - 4.6 mg/dL  Glucose, capillary     Status: None   Collection Time: 05/30/17  7:27 AM  Result Value Ref Range   Glucose-Capillary 67 65 - 99 mg/dL  Glucose, capillary     Status: None   Collection Time: 05/30/17  8:12 AM  Result Value Ref Range   Glucose-Capillary 91 65 - 99 mg/dL  Glucose, capillary     Status: Abnormal   Collection Time: 05/30/17 11:58 AM  Result Value Ref Range   Glucose-Capillary 133 (H) 65 - 99 mg/dL  Glucose, capillary     Status: Abnormal   Collection Time: 05/30/17  4:01 PM  Result Value Ref Range   Glucose-Capillary 225 (H) 65 - 99 mg/dL    Mr Cervical Spine Wo Contrast  Result Date: 05/29/2017 CLINICAL DATA:  RIGHT-sided weakness and fever. Evaluate neck pain. History of dementia, diabetes, hypertension hyperlipidemia. EXAM: MRI CERVICAL SPINE WITHOUT CONTRAST TECHNIQUE: Multiplanar, multisequence MR imaging of the cervical spine was performed. No intravenous contrast was administered. COMPARISON:  CT cervical spine January 27, 2017 FINDINGS: Moderately motion degraded sagittal sequences, axial sequences are severely motion degraded. ALIGNMENT: Straightened cervical lordosis.  No malalignment. VERTEBRAE/DISCS: Vertebral bodies are intact.  C3-4 arthrodesis. Moderate to severe disc height loss and moderate chronic discogenic endplate changes W2-3 through C6-7, moderate C7-T1. Disc desiccation all levels compatible with degenerative discs. Scattered old Schmorl's nodes. No suspicious or acute bone marrow signal. Included thoracic spine demonstrates 11 mm synovial cyst within LEFT ligamentum flavum at T2-3 resulting in mild canal stenosis. CORD:10 mm segment of cord compression and mild cord edema at C2-3, no syrinx. Limited assessment due to patient motion. POSTERIOR FOSSA, VERTEBRAL ARTERIES, PARASPINAL TISSUES: No MR findings of ligamentous injury. Vertebral artery flow voids obscured by motion. Included posterior fossa and paraspinal soft tissues are normal. DISC LEVELS (limited assessment of the neural foramen due to motion. ): C2-3: Moderate broad-based disc bulge, uncovertebral hypertrophy and ligamentum flavum redundancy resulting in severe canal stenosis, AP dimension of the canal is 2 mm. C3-4: Arthrodesis. Moderate canal stenosis ; severe neural foraminal narrowing better demonstrated on prior CT. C4-5: Moderate broad-based disc bulge, uncovertebral hypertrophy. Moderate canal stenosis ; severe neural foraminal narrowing better demonstrated on prior CT. C5-6: Moderate broad-based disc bulge, uncovertebral hypertrophy. Moderate canal stenosis. Severe RIGHT neural foraminal narrowing better is demonstrated on prior CT. C6-7: Moderate broad-based disc bulge, uncovertebral hypertrophy. Mild canal stenosis. Severe neural foraminal narrowing on the RIGHT, better demonstrated on prior CT. C7-T1: Moderate broad-based disc bulge, uncovertebral hypertrophy. Mild canal stenosis. Severe neural foraminal narrowing better demonstrated on prior CT. IMPRESSION: 1. Motion degraded examination. 2. Degenerative change of the cervical spine without acute fracture or malalignment. 3. Severe canal stenosis C2-3 with cord compression and focal cord edema, no  demonstrable syrinx. Moderate canal stenosis C4-5 and C5-6. 4. Multilevel severe neural foraminal narrowing, better seen on prior CT. Critical Value/emergent results were called by telephone at the time of interpretation on 05/29/2017 at 10:30 pm to Dr. Leeanne Mannan, who verbally acknowledged these results. Electronically Signed   By: Elon Alas M.D.   On: 05/29/2017 22:32   Mr Shoulder Right Wo Contrast  Result Date: 05/30/2017 CLINICAL DATA:  Right shoulder pain. EXAM: MRI OF THE RIGHT SHOULDER WITHOUT CONTRAST TECHNIQUE: Multiplanar, multisequence MR imaging of the shoulder was performed. No intravenous contrast was administered. COMPARISON:  None. FINDINGS: Despite efforts by the technologist and patient, motion artifact is present on today's exam and could not be eliminated. This reduces exam sensitivity and specificity. Rotator cuff: There is severe supraspinatus and infraspinatus tendinosis with low-grade bursal surface tearing of the distal fibers at the footprint. There is a 5 mm T1 and T2 hypointensity in the junctional fibers of the posterior supraspinatus and anterior infraspinatus with surrounding edema. There  is low-grade undersurface tearing of the infraspinatus and subscapularis tendons. The teres minor tendon is intact. Muscles: There is severe fatty atrophy of the teres minor muscle. Mild fatty infiltration of the subscapularis muscle. Biceps long head: Probable partial tearing of the intra-articular portion near the biceps labral anchor. Acromioclavicular Joint: Moderate arthropathy of the acromioclavicular joint. Type II acromion. No subacromial/subdeltoid bursal fluid. Glenohumeral Joint: Trace joint effusion. Areas of full-thickness cartilage loss along the glenoid and humeral head. Labrum: There is circumferential degenerative tearing of the entire labrum. Bones: Prominent glenohumeral osteophytes. No marrow abnormality, fracture, or dislocation. Other: None. IMPRESSION: 1. 5 mm focus  of T1 and T2 hypointensity in the junctional fibers of the posterior supraspinatus and anterior infraspinatus tendons with surrounding edema. Findings may represent calcific tendinitis, and correlation with right shoulder x-rays is recommended for confirmation. 2. Severe supraspinatus and infraspinatus tendinosis with areas of low-grade undersurface and bursal surface tearing. Mild subscapularis tendinosis with areas of low-grade undersurface tearing. No high-grade rotator cuff tear. 3. Probable partial tearing of the intra-articular biceps tendon. 4. Severe glenohumeral and moderate acromioclavicular degenerative changes. 5. Circumferential degenerative tearing of the labrum. 6. No subacromial/subdeltoid bursitis or biceps tenosynovitis as clinically queried. Electronically Signed   By: Titus Dubin M.D.   On: 05/30/2017 08:21    Review of Systems  HENT: Negative.   Eyes: Negative.   Respiratory: Negative.   Cardiovascular: Negative.   Gastrointestinal: Negative.   Genitourinary: Negative.   Musculoskeletal: Negative.   Skin: Negative.   Neurological: Positive for weakness.       Generalized weakness in all 4 extremities arms and legs included. Deterioration of gait  Endo/Heme/Allergies: Negative.   Psychiatric/Behavioral: Negative.    Blood pressure 129/79, pulse (!) 108, temperature 98.2 F (36.8 C), temperature source Axillary, resp. rate 20, height '6\' 3"'  (1.905 m), weight 99.3 kg (218 lb 14.4 oz), SpO2 100 %. Physical Exam  Constitutional: He appears well-developed and well-nourished.  Eyes: Conjunctivae and EOM are normal.  Right eye enucleation  Neck: Normal range of motion. Neck supple.  Cardiovascular: Normal rate and regular rhythm.   Respiratory: Effort normal and breath sounds normal.  GI: Soft. Bowel sounds are normal.  Neurological:  Weakness greater on the right side than on the left side in the deltoids biceps grips and intrinsics all graded at 4 minus out of 5 on the  right compared to the left which is 4 out of 5. Absent reflexes in the biceps and triceps both patellar reflexes are 2+ Achilles reflexes are 3+ with 2 beats of clonus on the right. Sensation is intact to pin and light touch in all 4 extremities. Cranial nerve examination is intact patient has a right eye enucleation  Skin: Skin is warm and dry.    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 Patient has not been on any surgical intervention daughters are not being depressed for this for him. I'm in agreement with her plan and believe that conservative management of this process in this patient of an advanced stage is appropriate treatment plan.  Voula Waln J 05/30/2017, 4:19 PM

## 2017-05-30 NOTE — Progress Notes (Signed)
Discussed with primary--as for the stenosis Neurosurgery has been called and will defer steroid and surgical treatment to them.  RTC tendonitis was also noted and will need NSAIDS and rest. Neurology S/O  Etta Quill PA-C [facility] [phone]  M-F  (8:30 am- 4 PM)  05/30/2017, 9:52 AM

## 2017-05-30 NOTE — Progress Notes (Signed)
PROGRESS NOTE    Marvin Bishop  JME:[id number] DOB: Jul 09, 1920 DOA: 05/27/2017 PCP: [name], MD   Brief Narrative:  83 y.o.malewith medical history significant of dementia, diabetes mellitus, hypertension, hyperlipidemia, history of PE on chronic anticoagulation history of prostate cancer and other comorbids who Presented with right-sided weakness started at 10:30 AM. He initially had generalized weakness and overall feeling badly. patient has dementia unable to provide detailed medical history seems that the symptoms have been getting gradually worse until has gotten severely worse at 10:30 AM. He also developed a fever up to 102. He has had decreased by mouth intake patient have had falls in the past today he slid down from the chair. Was worked up for CVA and found to have a PNA and UTI and currently being treated. Patient refused MRI of C Spine and Right Shoulder yesterday morning but went for it later on in the evening. MRI of the C Spine showed severe canal stenosis at C2-C3 so Neurosurgery was consulted and after extensive discussion family and patient wish to pursue conservative course of treatment with PT and OT. MRI of the shoulder showed tendinosis and partial tear so Orthopedic Surgery was consulted and pending to see the patient.   Assessment & Plan:   Active Problems:   Hypertension   IDDM (insulin dependent diabetes mellitus) (HCC)   Dehydration   Dementia without behavioral disturbance   Chronic combined systolic and diastolic congestive heart failure (HCC)   History of pulmonary embolus (PE)   TIA (transient ischemic attack)   Sepsis (HCC)   CKD (chronic kidney disease), stage III  Right-sided weakness- MRI brain is negative for stroke. MRA brain shows no high-grade stenosis. 3 mm saccular aneurysm noted.  -Neurology is following and has ordered MRI cervical spine to rule out cervical hemicord lesion. However patient has refused -Neuro thinks Right Arm weakness  is 2/2 to Adhesive caspulitis/Tendinitis so MRI of Shoulder was ordered and read is below -Consulted Orthopedic Surgery for Shoulder Evaluation  -PT OT recommending SNF -Appeciate Neuro Recc's  Severe spondylitic myelopathy at C2-C3 with Quadraparesis -Seen on MRI -Consulted Neurosurgery -Conservative Management with PT/OT  History of pulmonary embolism- -Continue Apixaban  Community-acquired Pneumonia-  -Patient was initially started on vancomycin and Zosyn, lactate was elevated 2.37 and improved to 1.58.  -Blood cultures 2 remain negative at 3 Days.  -Will D/C Vancomycin  Acute E Coli UTI, poA -Patient's Urinalysis showed Rare Bacteria, Moderate Hb, Small Leukocytes, 6-30 WBC -Urine Cx showed >100,000 CFU of E Coli and it was sensitive to Zosyn and Ceftriaxone -C/w Abx as above and de-escalate  Sepsis-  -Sepsis physiology improved and resolved, lactic acid is down to 1.58 as well as WBC improving, likely from pneumonia or UTI. Follow urine culture result sensitivities and will continue Abx  Dementia without behavioral disturbance- stable  Hypertension-  -Patient blood pressure is running on low side so , metoprolol, Lasix currently on hold.  -Will consider restarting in AM   Diabetes mellitus -CBG's ranging from 67-225 -Continue sliding scale insulin with NovoLog.  -Levemir 7 units subcutaneous daily.  Chronic systolic and diastolic CHF-  -Lasix on hold, Patient started on gentle IV hydration with normal saline at 75 mL/hr but will now D/C.   Follow BMP in a.m. Monitor strict intake and output.  -Troponins were Flat at 0.06 and not indicative of ACS  DVT prophylaxis: Anticoagulated with Apixaban Code Status: FULL CODE Family Communication: No family present at bedside  Disposition Plan: SNF  Consultants:   Neurology   Procedures:  ECHOCARDIOGRAM Study Conclusions  - Left ventricle: The cavity size was normal. There was mild   concentric hypertrophy.  Systolic function was normal. The   estimated ejection fraction was in the range of 55% to 60%. Wall   motion was normal; there were no regional wall motion   abnormalities. Doppler parameters are consistent with abnormal   left ventricular relaxation (grade 1 diastolic dysfunction). - Aortic valve: Noncoronary cusp mobility was severely restricted.   There was trivial regurgitation. Valve area (VTI): 2.7 cm^2.   Valve area (Vmax): 2.37 cm^2. Valve area (Vmean): 2.34 cm^2  Bilateral Carotid Duplex Bilateral: No significant (1-39%) ICA stenosis. Antegrade vertebral flow.    Antimicrobials:  Anti-infectives    Start     Dose/Rate Route Frequency Ordered Stop   05/28/17 2000  vancomycin (VANCOCIN) IVPB 1000 mg/200 mL premix     1,000 mg 200 mL/hr over 60 Minutes Intravenous Every 24 hours 05/28/17 0252     05/28/17 0600  piperacillin-tazobactam (ZOSYN) IVPB 3.375 g     3.375 g 12.5 mL/hr over 240 Minutes Intravenous Every 8 hours 05/28/17 0252     05/27/17 2115  piperacillin-tazobactam (ZOSYN) IVPB 3.375 g     3.375 g 100 mL/hr over 30 Minutes Intravenous  Once 05/27/17 2102 05/27/17 2145   05/27/17 2115  vancomycin (VANCOCIN) IVPB 1000 mg/200 mL premix     1,000 mg 200 mL/hr over 60 Minutes Intravenous  Once 05/27/17 2102 05/27/17 2213     Subjective: Seen and examined at bedside and stated he was feeling good. No nausea or vomiting. Denied any CP or SOB. States right shoulder still a little painful.   Objective: Vitals:   05/30/17 0120 05/30/17 0453 05/30/17 1027 05/30/17 1851  BP: (!) 131/59 140/70 129/79 122/73  Pulse: 87 88 (!) 108 94  Resp: 20 20 20 18   Temp: 98.4 F (36.9 C) 97.9 F (36.6 C) 98.2 F (36.8 C) 97.9 F (36.6 C)  TempSrc: Oral Oral Axillary Oral  SpO2: 99% 100% 100% 99%  Weight:      Height:        Intake/Output Summary (Last 24 hours) at 05/30/17 1906 Last data filed at 05/30/17 0745  Gross per 24 hour  Intake              580 ml  Output               500 ml  Net               80 ml   Filed Weights   05/27/17 2039 05/28/17 0239  Weight: 97.5 kg (215 lb) 99.3 kg (218 lb 14.4 oz)   Examination: Physical Exam:  Constitutional: Pleasant 83 yo AAM in NAD Eyes:  Right eye missing. Left eye no scleral icterius ENMT: Grossly normal hearing. Mucous membranes appear moist Neck: Supple with no JVD Respiratory: Diminished to Auscultation but no wheezing/rales/rhonchi. Patient was not tachypenic or using any accessory muscles to breathe Cardiovascular: RRR; S1 S2; No LE edema Abdomen: Soft, NT, ND. Bowel sounds present GU: Deferred Musculoskeletal: No contractures or Cyanosis Skin: Warm and dry. No rashes or lesions on a limited skin eval Neurologic: CN 2-12 grossly intact. No appreciable focal deficits.  Psychiatric: Pleasant mood and affect. Awake and alert but not oriented   Data Reviewed: I have personally reviewed following labs and imaging studies  CBC:  Recent Labs Lab 05/27/17 2049 05/29/17 1120 05/30/17 0440  WBC 14.4*  9.9 8.8  NEUTROABS 11.5* 7.6 5.9  HGB 13.6 12.4* 11.6*  HCT 39.3 36.3* 34.9*  MCV 80.4 80.1 81.7  PLT 217 193 591   Basic Metabolic Panel:  Recent Labs Lab 05/27/17 2049 05/29/17 1120 05/30/17 0440  NA 139 142 142  K 3.7 3.3* 3.5  CL 101 107 108  CO2 27 27 29   GLUCOSE 290* 133* 84  BUN 30* 16 11  CREATININE 1.87* 1.31* 1.26*  CALCIUM 9.2 8.6* 8.5*  MG  --  1.8 1.7  PHOS  --  1.9* 2.4*   GFR: Estimated Creatinine Clearance: 41.9 mL/min (A) (by C-G formula based on SCr of 1.26 mg/dL (H)). Liver Function Tests:  Recent Labs Lab 05/27/17 2049 05/29/17 1120 05/30/17 0440  AST 21 25 20   ALT 17 18 14*  ALKPHOS 73 54 51  BILITOT 0.9 1.2 1.1  PROT 7.5 6.6 6.0*  ALBUMIN 3.6 2.7* 2.5*   No results for input(s): LIPASE, AMYLASE in the last 168 hours. No results for input(s): AMMONIA in the last 168 hours. Coagulation Profile:  Recent Labs Lab 05/27/17 2049  INR 2.33   Cardiac  Enzymes:  Recent Labs Lab 05/29/17 1120 05/29/17 1412 05/29/17 1911  TROPONINI 0.06* 0.06* 0.06*   BNP (last 3 results) No results for input(s): PROBNP in the last 8760 hours. HbA1C: No results for input(s): HGBA1C in the last 72 hours. CBG:  Recent Labs Lab 05/30/17 0349 05/30/17 0727 05/30/17 0812 05/30/17 1158 05/30/17 1601  GLUCAP 79 67 91 133* 225*   Lipid Profile:  Recent Labs  05/29/17 1120  CHOL 122  HDL 27*  LDLCALC 82  TRIG 67  CHOLHDL 4.5   Thyroid Function Tests: No results for input(s): TSH, T4TOTAL, FREET4, T3FREE, THYROIDAB in the last 72 hours. Anemia Panel: No results for input(s): VITAMINB12, FOLATE, FERRITIN, TIBC, IRON, RETICCTPCT in the last 72 hours. Sepsis Labs:  Recent Labs Lab 05/27/17 2115 05/28/17 0013 05/29/17 1120  PROCALCITON  --   --  0.12  LATICACIDVEN 2.37* 1.58  --     Recent Results (from the past 240 hour(s))  Culture, blood (Routine x 2)     Status: None (Preliminary result)   Collection Time: 05/27/17  8:51 PM  Result Value Ref Range Status   Specimen Description BLOOD RIGHT HAND  Final   Special Requests   Final    BOTTLES DRAWN AEROBIC AND ANAEROBIC Blood Culture adequate volume   Culture   Final    NO GROWTH 3 DAYS Performed at [facility], [place] [address], [place], [place] [address]    Report Status PENDING  Incomplete  Culture, blood (Routine x 2)     Status: None (Preliminary result)   Collection Time: 05/27/17  9:06 PM  Result Value Ref Range Status   Specimen Description BLOOD LEFT ARM  Final   Special Requests   Final    BOTTLES DRAWN AEROBIC AND ANAEROBIC Blood Culture adequate volume   Culture   Final    NO GROWTH 3 DAYS Performed at [facility], [address], [place], [place] [address]    Report Status PENDING  Incomplete  Urine culture     Status: Abnormal   Collection Time: 05/27/17  9:20 PM  Result Value Ref Range Status   Specimen Description URINE, CLEAN CATCH  Final    Special Requests NONE  Final   Culture >=100,000 COLONIES/mL ESCHERICHIA COLI (A)  Final   Report Status 05/30/2017 FINAL  Final  Organism ID, Bacteria ESCHERICHIA COLI (A)  Final      Susceptibility   Escherichia coli - MIC*    AMPICILLIN >=32 RESISTANT Resistant     CEFAZOLIN >=64 RESISTANT Resistant     CEFTRIAXONE <=1 SENSITIVE Sensitive     CIPROFLOXACIN <=0.25 SENSITIVE Sensitive     GENTAMICIN <=1 SENSITIVE Sensitive     IMIPENEM <=0.25 SENSITIVE Sensitive     NITROFURANTOIN <=16 SENSITIVE Sensitive     TRIMETH/SULFA <=20 SENSITIVE Sensitive     AMPICILLIN/SULBACTAM >=32 RESISTANT Resistant     PIP/TAZO 8 SENSITIVE Sensitive     Extended ESBL NEGATIVE Sensitive     * >=100,000 COLONIES/mL ESCHERICHIA COLI    Radiology Studies: Mr Cervical Spine Wo Contrast  Result Date: 05/29/2017 CLINICAL DATA:  RIGHT-sided weakness and fever. Evaluate neck pain. History of dementia, diabetes, hypertension hyperlipidemia. EXAM: MRI CERVICAL SPINE WITHOUT CONTRAST TECHNIQUE: Multiplanar, multisequence MR imaging of the cervical spine was performed. No intravenous contrast was administered. COMPARISON:  CT cervical spine January 27, 2017 FINDINGS: Moderately motion degraded sagittal sequences, axial sequences are severely motion degraded. ALIGNMENT: Straightened cervical lordosis.  No malalignment. VERTEBRAE/DISCS: Vertebral bodies are intact. C3-4 arthrodesis. Moderate to severe disc height loss and moderate chronic discogenic endplate changes F7-4 through C6-7, moderate C7-T1. Disc desiccation all levels compatible with degenerative discs. Scattered old Schmorl's nodes. No suspicious or acute bone marrow signal. Included thoracic spine demonstrates 11 mm synovial cyst within LEFT ligamentum flavum at T2-3 resulting in mild canal stenosis. CORD:10 mm segment of cord compression and mild cord edema at C2-3, no syrinx. Limited assessment due to patient motion. POSTERIOR FOSSA, VERTEBRAL ARTERIES, PARASPINAL  TISSUES: No MR findings of ligamentous injury. Vertebral artery flow voids obscured by motion. Included posterior fossa and paraspinal soft tissues are normal. DISC LEVELS (limited assessment of the neural foramen due to motion. ): C2-3: Moderate broad-based disc bulge, uncovertebral hypertrophy and ligamentum flavum redundancy resulting in severe canal stenosis, AP dimension of the canal is 2 mm. C3-4: Arthrodesis. Moderate canal stenosis ; severe neural foraminal narrowing better demonstrated on prior CT. C4-5: Moderate broad-based disc bulge, uncovertebral hypertrophy. Moderate canal stenosis ; severe neural foraminal narrowing better demonstrated on prior CT. C5-6: Moderate broad-based disc bulge, uncovertebral hypertrophy. Moderate canal stenosis. Severe RIGHT neural foraminal narrowing better is demonstrated on prior CT. C6-7: Moderate broad-based disc bulge, uncovertebral hypertrophy. Mild canal stenosis. Severe neural foraminal narrowing on the RIGHT, better demonstrated on prior CT. C7-T1: Moderate broad-based disc bulge, uncovertebral hypertrophy. Mild canal stenosis. Severe neural foraminal narrowing better demonstrated on prior CT. IMPRESSION: 1. Motion degraded examination. 2. Degenerative change of the cervical spine without acute fracture or malalignment. 3. Severe canal stenosis C2-3 with cord compression and focal cord edema, no demonstrable syrinx. Moderate canal stenosis C4-5 and C5-6. 4. Multilevel severe neural foraminal narrowing, better seen on prior CT. Critical Value/emergent results were called by telephone at the time of interpretation on 05/29/2017 at 10:30 pm to Dr. Leeanne Mannan, who verbally acknowledged these results. Electronically Signed   By: Elon Alas M.D.   On: 05/29/2017 22:32   Mr Shoulder Right Wo Contrast  Result Date: 05/30/2017 CLINICAL DATA:  Right shoulder pain. EXAM: MRI OF THE RIGHT SHOULDER WITHOUT CONTRAST TECHNIQUE: Multiplanar, multisequence MR imaging of the  shoulder was performed. No intravenous contrast was administered. COMPARISON:  None. FINDINGS: Despite efforts by the technologist and patient, motion artifact is present on today's exam and could not be eliminated. This reduces exam sensitivity and specificity. Rotator cuff: There  is severe supraspinatus and infraspinatus tendinosis with low-grade bursal surface tearing of the distal fibers at the footprint. There is a 5 mm T1 and T2 hypointensity in the junctional fibers of the posterior supraspinatus and anterior infraspinatus with surrounding edema. There is low-grade undersurface tearing of the infraspinatus and subscapularis tendons. The teres minor tendon is intact. Muscles: There is severe fatty atrophy of the teres minor muscle. Mild fatty infiltration of the subscapularis muscle. Biceps long head: Probable partial tearing of the intra-articular portion near the biceps labral anchor. Acromioclavicular Joint: Moderate arthropathy of the acromioclavicular joint. Type II acromion. No subacromial/subdeltoid bursal fluid. Glenohumeral Joint: Trace joint effusion. Areas of full-thickness cartilage loss along the glenoid and humeral head. Labrum: There is circumferential degenerative tearing of the entire labrum. Bones: Prominent glenohumeral osteophytes. No marrow abnormality, fracture, or dislocation. Other: None. IMPRESSION: 1. 5 mm focus of T1 and T2 hypointensity in the junctional fibers of the posterior supraspinatus and anterior infraspinatus tendons with surrounding edema. Findings may represent calcific tendinitis, and correlation with right shoulder x-rays is recommended for confirmation. 2. Severe supraspinatus and infraspinatus tendinosis with areas of low-grade undersurface and bursal surface tearing. Mild subscapularis tendinosis with areas of low-grade undersurface tearing. No high-grade rotator cuff tear. 3. Probable partial tearing of the intra-articular biceps tendon. 4. Severe glenohumeral and  moderate acromioclavicular degenerative changes. 5. Circumferential degenerative tearing of the labrum. 6. No subacromial/subdeltoid bursitis or biceps tenosynovitis as clinically queried. Electronically Signed   By: Titus Dubin M.D.   On: 05/30/2017 08:21   Scheduled Meds: . apixaban  5 mg Oral BID  . gabapentin  200 mg Oral QHS  . insulin aspart  0-9 Units Subcutaneous Q4H  . insulin detemir  7 Units Subcutaneous QHS  . memantine  10 mg Oral BID  . pantoprazole  40 mg Oral Daily  . simvastatin  40 mg Oral q morning - 10a   Continuous Infusions: . sodium chloride 1,000 mL (05/28/17 0511)  . sodium chloride Stopped (05/30/17 1113)  . piperacillin-tazobactam (ZOSYN)  IV 3.375 g (05/30/17 1741)  . vancomycin Stopped (05/29/17 2244)    LOS: 3 days   Kerney Elbe, DO [facility] Pager [phone]  If 7PM-7AM, please contact night-coverage [email or web address] Password [email or web address] 05/30/2017, 7:06 PM

## 2017-05-31 DIAGNOSIS — E876 Hypokalemia: Secondary | ICD-10-CM

## 2017-05-31 LAB — GLUCOSE, CAPILLARY
GLUCOSE-CAPILLARY: 96 mg/dL (ref 65–99)
Glucose-Capillary: 109 mg/dL — ABNORMAL HIGH (ref 65–99)
Glucose-Capillary: 251 mg/dL — ABNORMAL HIGH (ref 65–99)
Glucose-Capillary: 94 mg/dL (ref 65–99)

## 2017-05-31 LAB — COMPREHENSIVE METABOLIC PANEL
ALBUMIN: 2.4 g/dL — AB (ref 3.5–5.0)
ALT: 16 U/L — AB (ref 17–63)
AST: 20 U/L (ref 15–41)
Alkaline Phosphatase: 50 U/L (ref 38–126)
Anion gap: 4 — ABNORMAL LOW (ref 5–15)
BILIRUBIN TOTAL: 1.2 mg/dL (ref 0.3–1.2)
BUN: 11 mg/dL (ref 6–20)
CO2: 28 mmol/L (ref 22–32)
CREATININE: 1.23 mg/dL (ref 0.61–1.24)
Calcium: 8.5 mg/dL — ABNORMAL LOW (ref 8.9–10.3)
Chloride: 107 mmol/L (ref 101–111)
GFR calc Af Amer: 56 mL/min — ABNORMAL LOW (ref >60)
GFR calc non Af Amer: 48 mL/min — ABNORMAL LOW (ref >60)
GLUCOSE: 93 mg/dL (ref 65–99)
POTASSIUM: 3.1 mmol/L — AB (ref 3.5–5.1)
Sodium: 139 mmol/L (ref 135–145)
TOTAL PROTEIN: 6 g/dL — AB (ref 6.5–8.1)

## 2017-05-31 LAB — CBC WITH DIFFERENTIAL/PLATELET
BASOS ABS: 0 10*3/uL (ref 0.0–0.1)
Basophils Relative: 0 %
EOS ABS: 0.1 10*3/uL (ref 0.0–0.7)
EOS PCT: 2 %
HCT: 33.8 % — ABNORMAL LOW (ref 39.0–52.0)
Hemoglobin: 11.2 g/dL — ABNORMAL LOW (ref 13.0–17.0)
LYMPHS PCT: 20 %
Lymphs Abs: 1.7 10*3/uL (ref 0.7–4.0)
MCH: 26.9 pg (ref 26.0–34.0)
MCHC: 33.1 g/dL (ref 30.0–36.0)
MCV: 81.1 fL (ref 78.0–100.0)
MONO ABS: 0.9 10*3/uL (ref 0.1–1.0)
Monocytes Relative: 11 %
Neutro Abs: 5.6 10*3/uL (ref 1.7–7.7)
Neutrophils Relative %: 67 %
PLATELETS: 216 10*3/uL (ref 150–400)
RBC: 4.17 MIL/uL — AB (ref 4.22–5.81)
RDW: 15.2 % (ref 11.5–15.5)
WBC: 8.3 10*3/uL (ref 4.0–10.5)

## 2017-05-31 LAB — PROCALCITONIN

## 2017-05-31 LAB — PHOSPHORUS: Phosphorus: 2.2 mg/dL — ABNORMAL LOW (ref 2.5–4.6)

## 2017-05-31 LAB — MAGNESIUM: Magnesium: 1.8 mg/dL (ref 1.7–2.4)

## 2017-05-31 MED ORDER — POTASSIUM CHLORIDE CRYS ER 20 MEQ PO TBCR
40.0000 meq | EXTENDED_RELEASE_TABLET | Freq: Once | ORAL | Status: AC
  Administered 2017-05-31: 40 meq via ORAL
  Filled 2017-05-31: qty 2

## 2017-05-31 MED ORDER — CEFPODOXIME PROXETIL 200 MG PO TABS: 200.0000 mg | ORAL_TABLET | Freq: Two times a day (BID) | ORAL | 0 refills | Status: DC

## 2017-05-31 MED ORDER — ACETAMINOPHEN 325 MG PO TABS: 650.0000 mg | ORAL_TABLET | ORAL | 0 refills | Status: DC | PRN

## 2017-05-31 MED ORDER — POTASSIUM PHOSPHATES 15 MMOLE/5ML IV SOLN: 30.0000 mmol | Freq: Once | INTRAVENOUS | Status: DC

## 2017-05-31 MED ORDER — POTASSIUM PHOSPHATES 15 MMOLE/5ML IV SOLN
20.0000 mmol | Freq: Once | INTRAVENOUS | Status: AC
  Administered 2017-05-31: 20 mmol via INTRAVENOUS
  Filled 2017-05-31: qty 6.67

## 2017-05-31 MED ORDER — CEFPODOXIME PROXETIL 200 MG PO TABS
200.0000 mg | ORAL_TABLET | Freq: Two times a day (BID) | ORAL | Status: DC
  Filled 2017-05-31: qty 1

## 2017-05-31 MED ORDER — SENNOSIDES-DOCUSATE SODIUM 8.6-50 MG PO TABS: 1.0000 | ORAL_TABLET | Freq: Every evening | ORAL | 0 refills | Status: DC | PRN

## 2017-05-31 MED ORDER — APIXABAN 5 MG PO TABS: 5.0000 mg | ORAL_TABLET | Freq: Two times a day (BID) | ORAL | 0 refills | Status: DC

## 2017-05-31 NOTE — Discharge Summary (Signed)
Physician Discharge Summary  DERAK SCHURMAN Sr. JEH:[id number] DOB: 1920-01-04 DOA: 05/27/2017  PCP: [name], MD  Admit date: 05/27/2017 Discharge date: 05/31/2017  Admitted From: Home Disposition: SNF  Recommendations for Outpatient Follow-up:  1. Follow up with PCP in 1-2 weeks 2. Follow up with Orthopedic Surgery Dr. Marcelino Scot in 1-2 weeks 3. Follow up with Neurosurgery Dr. Ellene Route as an outpatient 4. Please obtain CMP/CBC, Mag, Phos in one week  Home Health: No Equipment/Devices: None   Discharge Condition: Stable  CODE STATUS: FULL CODE Diet recommendation: Heart Healthy/Carb Modified Diet  Brief/Interim Summary: The patient is 83 y.o.malewith medical history significant of dementia, diabetes mellitus, hypertension, hyperlipidemia, history of PE on chronic anticoagulation history of prostate cancer and other comorbids who Presented with right-sided weakness started at 10:30 AM on the day of Admission. He initially had generalized weakness and overall feeling badly. Patient has dementia and was unable to provide detailed medical history but it seemed that the symptoms have been getting gradually worse until has gotten severely worse at 10:30 AM. He also developed a fever up to 102. He has had decreased by mouth intake patient have had falls in the past today he slid down from the chair. Was worked up for CVA and found to have a PNA and UTI and currently being treated. Patient refused MRI of C Spine and Right Shoulder on 05/29/17 morning but went for it later on in the evening. MRI of the C Spine showed severe canal stenosis at C2-C3 so Neurosurgery was consulted and after extensive discussion family and patient wish to pursue conservative course of treatment with PT and OT. MRI of the shoulder showed tendinosis and partial tear so Orthopedic Surgery was consulted but will see the patient in the outpatients setting. Patient improved and Abx were transitioned to po and at this time he was  deemed medically stable to be D/C'd to SNF and will need to follow up with PCP, Orthopedics, Neurosurgery in the outpatient setting.   Discharge Diagnoses:  Active Problems:   Hypertension   IDDM (insulin dependent diabetes mellitus) (HCC)   Dehydration   Dementia without behavioral disturbance   Chronic combined systolic and diastolic congestive heart failure (HCC)   History of pulmonary embolus (PE)   TIA (transient ischemic attack)   Sepsis (HCC)   CKD (chronic kidney disease), stage III  Right-sided Arm weakness, improving  -MRI brain is negative for stroke. MRA brain shows no high-grade stenosis. 3 mm saccular aneurysm noted.  -Neurology is following and has ordered MRI cervical spine to rule out cervical hemicord lesion. However patient has refused -Neuro thinks Right Arm weakness is 2/2 to Adhesive caspulitis/Tendinitis so MRI of Shoulder was ordered and read is below -Consulted Orthopedic Surgery for Shoulder Evaluation however Orthopedics was unfortunately not able to evaluate as an inpatient so will see patient as an outpatient   -PT OT recommending SNF -Appeciate Neuro Recc's  Severe spondylitic myelopathy at C2-C3 with Quadraparesis -Seen on MRI -Consulted Neurosurgery -Conservative Management with PT/OT -Follow up with Neurosurgery if worsens   History of Pulmonary Embolism -Continue Apixaban 5 mg po BID  Community-Acquired Pneumonia -Patient was initially started on vancomycin and Zosyn, lactate was elevated 2.37 and improved to 1.58.  -Blood cultures 2 remain negative at 3 Days.  -Will D/C Vancomycin and change IV Zosyn to po Cefpodoxime   Acute E Coli UTI, poA -Patient's Urinalysis showed Rare Bacteria, Moderate Hb, Small Leukocytes, 6-30 WBC -Urine Cx showed >100,000 CFU of E Coli and  it was sensitive to Zosyn and Ceftriaxone; Changed Abx from IV to po Cefpodoxime 200 mg po q12h x 3 days  Sepsis-  -Sepsis physiology improved and resolved, lactic acid is  down to 1.58 as well as WBC improving to 8.3 -Likely from pneumonia or UTI. Followed urine culture result sensitivities and will continue Abx po for 3 more days  Dementia without behavioral disturbance -Stable  Hypertension- -Patient blood pressure is running on low side so metoprolol, Lasix were held. -Ok to Restart Home Antihypertensives  Diabetes Mellitus -CBG's ranging from 96-162 -Resume Home Insulin Regimen   Chronic systolic and diastolic CHF- -Lasix on hold during Hospitalization -Patient was started on gentle IV hydration with normal saline at 75 mL/hr but D/C'd yesterday.   -Follow BMP in a.m. Monitor strict intake and output.  -Troponins were Flat at 0.06 and not indicative of ACS -Resume Home Lasix and follow up with PCP   Hypokalemia -Replete prior to D/C -Follow up CMP at SNF  Hypophosphatemia -Replete with IV KPhos Prior to D/C -Follow up Phos at [facility]  Discharge Instructions  Discharge Instructions    Call MD for:  difficulty breathing, headache or visual disturbances    Complete by:  As directed    Call MD for:  extreme fatigue    Complete by:  As directed    Call MD for:  hives    Complete by:  As directed    Call MD for:  persistant dizziness or light-headedness    Complete by:  As directed    Call MD for:  persistant nausea and vomiting    Complete by:  As directed    Call MD for:  redness, tenderness, or signs of infection (pain, swelling, redness, odor or green/yellow discharge around incision site)    Complete by:  As directed    Call MD for:  severe uncontrolled pain    Complete by:  As directed    Call MD for:  temperature >100.4    Complete by:  As directed    Diet - low sodium heart healthy    Complete by:  As directed    Discharge instructions    Complete by:  As directed    Follow up with PCP, Neurology, Neurosurgery, and Orthopedics as an outpatient. Take all medications as prescribed. If symptoms change or worsen please return to  the ED for evaluation.   Increase activity slowly    Complete by:  As directed      Allergies as of 05/31/2017      Reactions   Donepezil Nausea And Vomiting   Hydrocodone Nausea And Vomiting      Medication List    STOP taking these medications   XARELTO 10 MG Tabs tablet Generic drug:  rivaroxaban     TAKE these medications   acetaminophen 325 MG tablet Commonly known as:  TYLENOL Take 2 tablets (650 mg total) by mouth every 4 (four) hours as needed for mild pain (or temp > 37.5 C (99.5 F)).   apixaban 5 MG Tabs tablet Commonly known as:  ELIQUIS Take 1 tablet (5 mg total) by mouth 2 (two) times daily.   BD PEN NEEDLE NANO U/F 32G X 4 MM Misc Generic drug:  Insulin Pen Needle See admin instructions.   Carboxymethylcellul-Glycerin 0.5-0.9 % Soln Apply 1 drop to eye daily after breakfast.   cefpodoxime 200 MG tablet Commonly known as:  VANTIN Take 1 tablet (200 mg total) by mouth every 12 (twelve) hours.  CLARITIN 10 MG Caps Generic drug:  Loratadine Take 10 mg by mouth daily after breakfast.   DSS 100 MG Caps Take 200 mg by mouth 2 (two) times daily as needed for mild constipation. What changed:  when to take this   ELDERTONIC PO Take 15 mLs by mouth 2 (two) times daily.   erythromycin ophthalmic ointment Place 1 application into the right eye at bedtime.   esomeprazole 40 MG capsule Commonly known as:  NEXIUM Take 1 capsule (40 mg total) by mouth 2 (two) times daily before a meal.   famotidine 20 MG tablet Commonly known as:  PEPCID Take 1 tablet (20 mg total) by mouth at bedtime.   furosemide 40 MG tablet Commonly known as:  LASIX Take 40 mg by mouth daily after breakfast.   gabapentin 100 MG capsule Commonly known as:  NEURONTIN TAKE 2 CAPSULES BY MOUTH EVERY DAY AT BEDTIME   glipiZIDE 10 MG 24 hr tablet Commonly known as:  GLUCOTROL XL Take 10 mg by mouth daily with breakfast.   HUMALOG KWIKPEN 100 UNIT/ML KiwkPen Generic drug:  insulin  lispro Inject 2-7 Units into the skin 2 (two) times daily with a meal. 2 units if sugar level 150-200 and 7 units when above 200   insulin detemir 100 UNIT/ML injection Commonly known as:  LEVEMIR Inject 0.1 mLs (10 Units total) into the skin daily at 10 pm. What changed:  when to take this   memantine 10 MG tablet Commonly known as:  NAMENDA Take 1 tablet (10 mg total) by mouth 2 (two) times daily. Please call [phone] to schedule yearly appt.   metoprolol tartrate 25 MG tablet Commonly known as:  LOPRESSOR Take 12.5 mg by mouth 2 (two) times daily.   polyethylene glycol packet Commonly known as:  MIRALAX / GLYCOLAX Take 17 g by mouth daily as needed for mild constipation. Takes on MW   senna-docusate 8.6-50 MG tablet Commonly known as:  Senokot-S Take 1 tablet by mouth at bedtime as needed for moderate constipation.   simvastatin 40 MG tablet Commonly known as:  ZOCOR Take 40 mg by mouth every morning.      Follow-up Information    [name], MD. Call.   Specialty:  Internal Medicine Why:  Call to schedule an appointment after D/C from SNF Contact information: [address] [place] [address] [phone]        Altamese [place], MD. Call.   Specialty:  Orthopedic Surgery Why:  Follow up with Dr. Marcelino Scot for Right Shoulder as an outpatient.  Contact information: [place] [address] [phone]        Kristeen Miss, MD. Call.   Specialty:  Neurosurgery Why:  Call to follow up with Dr. Ellene Route as an outpatient for Cervical Spinal Canal Stenosis Contact information: [address]  [place] [address] [phone]          Allergies  Allergen Reactions  . Donepezil Nausea And Vomiting  . Hydrocodone Nausea And Vomiting   Consultations: Neurology Neurosurgery Orthopedic Surgery  Procedures/Studies: Dg Chest 2 View  Result Date: 05/27/2017 CLINICAL DATA:  Increasing weakness  EXAM: CHEST  2 VIEW COMPARISON:  01/27/2017 FINDINGS: Mild increased opacity at the right base. No pleural effusion. Stable cardiomediastinal silhouette with atherosclerosis. No pneumothorax. Degenerative changes of the spine. IMPRESSION: Mild right basilar atelectasis or infiltrate. Electronically Signed   By: Donavan Foil M.D.   On: 05/27/2017 21:40   Ct  Head Wo Contrast  Result Date: 05/27/2017 CLINICAL DATA:  Acute onset of worsening generalized weakness and difficulty walking. Bilateral hand pain. Initial encounter. EXAM: CT HEAD WITHOUT CONTRAST TECHNIQUE: Contiguous axial images were obtained from the base of the skull through the vertex without intravenous contrast. COMPARISON:  CT of the head performed 01/27/2017 FINDINGS: Brain: No evidence of acute infarction, hemorrhage, hydrocephalus, extra-axial collection or mass lesion/mass effect. Prominence of the ventricles and sulci reflects moderate cortical volume loss. Cerebellar atrophy is noted. Diffuse periventricular and subcortical white matter change likely reflects small vessel ischemic microangiopathy. The brainstem and fourth ventricle are within normal limits. The basal ganglia are unremarkable in appearance. The cerebral hemispheres demonstrate grossly normal gray-white differentiation. No mass effect or midline shift is seen. Vascular: No hyperdense vessel or unexpected calcification. Skull: There is no evidence of fracture; visualized osseous structures are unremarkable in appearance. Sinuses/Orbits: Postoperative change is noted at the right optic globe. The visualized portions of the left orbit are unremarkable in appearance. The paranasal sinuses and mastoid air cells are well-aerated. Other: No significant soft tissue abnormalities are seen. IMPRESSION: 1. No acute intracranial pathology seen on CT. 2. Moderate cortical volume loss and diffuse small vessel ischemic microangiopathy. Electronically Signed   By: Garald Balding M.D.   On:  05/27/2017 22:04   Mr Brain Wo Contrast  Result Date: 05/28/2017 CLINICAL DATA:  83 y/o M; right-sided weakness and fever. TIA, initial exam. EXAM: MRI HEAD WITHOUT CONTRAST MRA HEAD WITHOUT CONTRAST TECHNIQUE: Multiplanar, multiecho pulse sequences of the brain and surrounding structures were obtained without intravenous contrast. Angiographic images of the head were obtained using MRA technique without contrast. COMPARISON:  05/27/2017 CT head. FINDINGS: MRI HEAD FINDINGS Brain: No acute infarction, hemorrhage, hydrocephalus, extra-axial collection or mass lesion. Patchy nonspecific foci of T2 FLAIR hyperintense signal abnormality in periventricular white matter is compatible with moderate chronic microvascular ischemic changes for age. Advanced brain parenchymal volume loss. Vascular: As below. Skull and upper cervical spine: Normal marrow signal. Sinuses/Orbits: Negative. Other: None. MRA HEAD FINDINGS Motion degraded study, suboptimal evaluation for subtle stenosis or small aneurysm. Anterior circulation: No large vessel occlusion or high-grade stenosis. 3 mm superiorly directed outpouching of the right cavernous ICA probably representing a small saccular aneurysm (series 3, image 118). No additional aneurysm identified. Posterior circulation left dominant vertebrobasilar system. No large vessel occlusion or high-grade stenosis. Anatomic variant: Anterior communicating artery and right posterior communicating arteries are present. No left posterior communicating artery identified, likely hypoplastic or absent. IMPRESSION: MRI head: 1. No acute intracranial abnormality identified. 2. Moderate for age chronic microvascular ischemic changes and advanced parenchymal volume loss of the brain. MRI head: 1. Motion degraded study, suboptimal evaluation for subtle stenosis or small aneurysm. 2. Patent circle of Willis. No large vessel occlusion or high-grade stenosis. 3. 3 mm outpouching superiorly directed from  right cavernous ICA, probably a small saccular aneurysm. Electronically Signed   By: Kristine Garbe M.D.   On: 05/28/2017 05:09   Mr Cervical Spine Wo Contrast  Result Date: 05/29/2017 CLINICAL DATA:  RIGHT-sided weakness and fever. Evaluate neck pain. History of dementia, diabetes, hypertension hyperlipidemia. EXAM: MRI CERVICAL SPINE WITHOUT CONTRAST TECHNIQUE: Multiplanar, multisequence MR imaging of the cervical spine was performed. No intravenous contrast was administered. COMPARISON:  CT cervical spine January 27, 2017 FINDINGS: Moderately motion degraded sagittal sequences, axial sequences are severely motion degraded. ALIGNMENT: Straightened cervical lordosis.  No malalignment. VERTEBRAE/DISCS: Vertebral bodies are intact. C3-4 arthrodesis. Moderate to severe disc height loss and  moderate chronic discogenic endplate changes X3-2 through C6-7, moderate C7-T1. Disc desiccation all levels compatible with degenerative discs. Scattered old Schmorl's nodes. No suspicious or acute bone marrow signal. Included thoracic spine demonstrates 11 mm synovial cyst within LEFT ligamentum flavum at T2-3 resulting in mild canal stenosis. CORD:10 mm segment of cord compression and mild cord edema at C2-3, no syrinx. Limited assessment due to patient motion. POSTERIOR FOSSA, VERTEBRAL ARTERIES, PARASPINAL TISSUES: No MR findings of ligamentous injury. Vertebral artery flow voids obscured by motion. Included posterior fossa and paraspinal soft tissues are normal. DISC LEVELS (limited assessment of the neural foramen due to motion. ): C2-3: Moderate broad-based disc bulge, uncovertebral hypertrophy and ligamentum flavum redundancy resulting in severe canal stenosis, AP dimension of the canal is 2 mm. C3-4: Arthrodesis. Moderate canal stenosis ; severe neural foraminal narrowing better demonstrated on prior CT. C4-5: Moderate broad-based disc bulge, uncovertebral hypertrophy. Moderate canal stenosis ; severe neural  foraminal narrowing better demonstrated on prior CT. C5-6: Moderate broad-based disc bulge, uncovertebral hypertrophy. Moderate canal stenosis. Severe RIGHT neural foraminal narrowing better is demonstrated on prior CT. C6-7: Moderate broad-based disc bulge, uncovertebral hypertrophy. Mild canal stenosis. Severe neural foraminal narrowing on the RIGHT, better demonstrated on prior CT. C7-T1: Moderate broad-based disc bulge, uncovertebral hypertrophy. Mild canal stenosis. Severe neural foraminal narrowing better demonstrated on prior CT. IMPRESSION: 1. Motion degraded examination. 2. Degenerative change of the cervical spine without acute fracture or malalignment. 3. Severe canal stenosis C2-3 with cord compression and focal cord edema, no demonstrable syrinx. Moderate canal stenosis C4-5 and C5-6. 4. Multilevel severe neural foraminal narrowing, better seen on prior CT. Critical Value/emergent results were called by telephone at the time of interpretation on 05/29/2017 at 10:30 pm to Dr. Leeanne Mannan, who verbally acknowledged these results. Electronically Signed   By: Elon Alas M.D.   On: 05/29/2017 22:32   Mr Shoulder Right Wo Contrast  Result Date: 05/30/2017 CLINICAL DATA:  Right shoulder pain. EXAM: MRI OF THE RIGHT SHOULDER WITHOUT CONTRAST TECHNIQUE: Multiplanar, multisequence MR imaging of the shoulder was performed. No intravenous contrast was administered. COMPARISON:  None. FINDINGS: Despite efforts by the technologist and patient, motion artifact is present on today's exam and could not be eliminated. This reduces exam sensitivity and specificity. Rotator cuff: There is severe supraspinatus and infraspinatus tendinosis with low-grade bursal surface tearing of the distal fibers at the footprint. There is a 5 mm T1 and T2 hypointensity in the junctional fibers of the posterior supraspinatus and anterior infraspinatus with surrounding edema. There is low-grade undersurface tearing of the  infraspinatus and subscapularis tendons. The teres minor tendon is intact. Muscles: There is severe fatty atrophy of the teres minor muscle. Mild fatty infiltration of the subscapularis muscle. Biceps long head: Probable partial tearing of the intra-articular portion near the biceps labral anchor. Acromioclavicular Joint: Moderate arthropathy of the acromioclavicular joint. Type II acromion. No subacromial/subdeltoid bursal fluid. Glenohumeral Joint: Trace joint effusion. Areas of full-thickness cartilage loss along the glenoid and humeral head. Labrum: There is circumferential degenerative tearing of the entire labrum. Bones: Prominent glenohumeral osteophytes. No marrow abnormality, fracture, or dislocation. Other: None. IMPRESSION: 1. 5 mm focus of T1 and T2 hypointensity in the junctional fibers of the posterior supraspinatus and anterior infraspinatus tendons with surrounding edema. Findings may represent calcific tendinitis, and correlation with right shoulder x-rays is recommended for confirmation. 2. Severe supraspinatus and infraspinatus tendinosis with areas of low-grade undersurface and bursal surface tearing. Mild subscapularis tendinosis with areas of low-grade undersurface tearing. No  high-grade rotator cuff tear. 3. Probable partial tearing of the intra-articular biceps tendon. 4. Severe glenohumeral and moderate acromioclavicular degenerative changes. 5. Circumferential degenerative tearing of the labrum. 6. No subacromial/subdeltoid bursitis or biceps tenosynovitis as clinically queried. Electronically Signed   By: Titus Dubin M.D.   On: 05/30/2017 08:21   Mr Jodene Nam Head Wo Contrast  Result Date: 05/28/2017 CLINICAL DATA:  83 y/o M; right-sided weakness and fever. TIA, initial exam. EXAM: MRI HEAD WITHOUT CONTRAST MRA HEAD WITHOUT CONTRAST TECHNIQUE: Multiplanar, multiecho pulse sequences of the brain and surrounding structures were obtained without intravenous contrast. Angiographic images of  the head were obtained using MRA technique without contrast. COMPARISON:  05/27/2017 CT head. FINDINGS: MRI HEAD FINDINGS Brain: No acute infarction, hemorrhage, hydrocephalus, extra-axial collection or mass lesion. Patchy nonspecific foci of T2 FLAIR hyperintense signal abnormality in periventricular white matter is compatible with moderate chronic microvascular ischemic changes for age. Advanced brain parenchymal volume loss. Vascular: As below. Skull and upper cervical spine: Normal marrow signal. Sinuses/Orbits: Negative. Other: None. MRA HEAD FINDINGS Motion degraded study, suboptimal evaluation for subtle stenosis or small aneurysm. Anterior circulation: No large vessel occlusion or high-grade stenosis. 3 mm superiorly directed outpouching of the right cavernous ICA probably representing a small saccular aneurysm (series 3, image 118). No additional aneurysm identified. Posterior circulation left dominant vertebrobasilar system. No large vessel occlusion or high-grade stenosis. Anatomic variant: Anterior communicating artery and right posterior communicating arteries are present. No left posterior communicating artery identified, likely hypoplastic or absent. IMPRESSION: MRI head: 1. No acute intracranial abnormality identified. 2. Moderate for age chronic microvascular ischemic changes and advanced parenchymal volume loss of the brain. MRI head: 1. Motion degraded study, suboptimal evaluation for subtle stenosis or small aneurysm. 2. Patent circle of Willis. No large vessel occlusion or high-grade stenosis. 3. 3 mm outpouching superiorly directed from right cavernous ICA, probably a small saccular aneurysm. Electronically Signed   By: Kristine Garbe M.D.   On: 05/28/2017 05:09    ECHOCARDIOGRAM Study Conclusions  - Left ventricle: The cavity size was normal. There was mild concentric hypertrophy. Systolic function was normal. The estimated ejection fraction was in the range of 55% to  60%. Wall motion was normal; there were no regional wall motion abnormalities. Doppler parameters are consistent with abnormal left ventricular relaxation (grade 1 diastolic dysfunction). - Aortic valve: Noncoronary cusp mobility was severely restricted. There was trivial regurgitation. Valve area (VTI): 2.7 cm^2. Valve area (Vmax): 2.37 cm^2. Valve area (Vmean): 2.34 cm^2  Bilateral Carotid Duplex Bilateral: No significant (1-39%) ICA stenosis. Antegrade vertebral flow.    Subjective: Seen and examined at bedside and had no complaints and felt better. Stated his Right arm was still a little hard to move but he had just woken up. No other concerns or complaints and patient ready to go to SNF.  Discharge Exam: Vitals:   05/31/17 1043 05/31/17 1338  BP: 110/66 127/77  Pulse: 83 85  Resp: 16 18  Temp: 98.1 F (36.7 C) 97.9 F (36.6 C)  SpO2: 99% 100%   Vitals:   05/31/17 0030 05/31/17 0519 05/31/17 1043 05/31/17 1338  BP: 110/63 (!) 125/59 110/66 127/77  Pulse: 94 85 83 85  Resp: 18 18 16 18   Temp: 98.4 F (36.9 C) 98 F (36.7 C) 98.1 F (36.7 C) 97.9 F (36.6 C)  TempSrc: Oral Oral Oral Oral  SpO2: 99% 99% 99% 100%  Weight:      Height:       General: Pt  is alert, awake, not in acute distress Cardiovascular: RRR, S1/S2 +, no rubs, no gallops Respiratory: Diminished bilaterally, no wheezing, no rhonchi Abdominal: Soft, NT, ND, bowel sounds + Extremities: Trace edema, no cyanosis  The results of significant diagnostics from this hospitalization (including imaging, microbiology, ancillary and laboratory) are listed below for reference.    Microbiology: Recent Results (from the past 240 hour(s))  Culture, blood (Routine x 2)     Status: None (Preliminary result)   Collection Time: 05/27/17  8:51 PM  Result Value Ref Range Status   Specimen Description BLOOD RIGHT HAND  Final   Special Requests   Final    BOTTLES DRAWN AEROBIC AND ANAEROBIC Blood Culture  adequate volume   Culture   Final    NO GROWTH 3 DAYS Performed at [facility], [address], [place], [place] [address]    Report Status PENDING  Incomplete  Culture, blood (Routine x 2)     Status: None (Preliminary result)   Collection Time: 05/27/17  9:06 PM  Result Value Ref Range Status   Specimen Description BLOOD LEFT ARM  Final   Special Requests   Final    BOTTLES DRAWN AEROBIC AND ANAEROBIC Blood Culture adequate volume   Culture   Final    NO GROWTH 3 DAYS Performed at [facility], [address] [place], [place] [address]    Report Status PENDING  Incomplete  Urine culture     Status: Abnormal   Collection Time: 05/27/17  9:20 PM  Result Value Ref Range Status   Specimen Description URINE, CLEAN CATCH  Final   Special Requests NONE  Final   Culture >=100,000 COLONIES/mL ESCHERICHIA COLI (A)  Final   Report Status 05/30/2017 FINAL  Final   Organism ID, Bacteria ESCHERICHIA COLI (A)  Final      Susceptibility   Escherichia coli - MIC*    AMPICILLIN >=32 RESISTANT Resistant     CEFAZOLIN >=64 RESISTANT Resistant     CEFTRIAXONE <=1 SENSITIVE Sensitive     CIPROFLOXACIN <=0.25 SENSITIVE Sensitive     GENTAMICIN <=1 SENSITIVE Sensitive     IMIPENEM <=0.25 SENSITIVE Sensitive     NITROFURANTOIN <=16 SENSITIVE Sensitive     TRIMETH/SULFA <=20 SENSITIVE Sensitive     AMPICILLIN/SULBACTAM >=32 RESISTANT Resistant     PIP/TAZO 8 SENSITIVE Sensitive     Extended ESBL NEGATIVE Sensitive     * >=100,000 COLONIES/mL ESCHERICHIA COLI    Labs: BNP (last 3 results)  Recent Labs  11/08/16 0630  BNP 68.3   Basic Metabolic Panel:  Recent Labs Lab 05/27/17 2049 05/29/17 1120 05/30/17 0440 05/31/17 0517  NA 139 142 142 139  K 3.7 3.3* 3.5 3.1*  CL 101 107 108 107  CO2 27 27 29 28   GLUCOSE 290* 133* 84 93  BUN 30* 16 11 11   CREATININE 1.87* 1.31* 1.26* 1.23  CALCIUM 9.2 8.6* 8.5* 8.5*  MG  --  1.8 1.7 1.8  PHOS  --  1.9* 2.4* 2.2*   Liver  Function Tests:  Recent Labs Lab 05/27/17 2049 05/29/17 1120 05/30/17 0440 05/31/17 0517  AST 21 25 20 20   ALT 17 18 14* 16*  ALKPHOS 73 54 51 50  BILITOT 0.9 1.2 1.1 1.2  PROT 7.5 6.6 6.0* 6.0*  ALBUMIN 3.6 2.7* 2.5* 2.4*   No results for input(s): LIPASE, AMYLASE in the last 168 hours. No results for input(s): AMMONIA in the last 168 hours. CBC:  Recent Labs  Lab 05/27/17 2049 05/29/17 1120 05/30/17 0440 05/31/17 0517  WBC 14.4* 9.9 8.8 8.3  NEUTROABS 11.5* 7.6 5.9 5.6  HGB 13.6 12.4* 11.6* 11.2*  HCT 39.3 36.3* 34.9* 33.8*  MCV 80.4 80.1 81.7 81.1  PLT 217 193 213 216   Cardiac Enzymes:  Recent Labs Lab 05/29/17 1120 05/29/17 1412 05/29/17 1911  TROPONINI 0.06* 0.06* 0.06*   BNP: Invalid input(s): POCBNP CBG:  Recent Labs Lab 05/30/17 1601 05/30/17 2003 05/31/17 0029 05/31/17 0408 05/31/17 0821  GLUCAP 225* 162* 109* 94 96   D-Dimer No results for input(s): DDIMER in the last 72 hours. Hgb A1c No results for input(s): HGBA1C in the last 72 hours. Lipid Profile  Recent Labs  05/29/17 1120  CHOL 122  HDL 27*  LDLCALC 82  TRIG 67  CHOLHDL 4.5   Thyroid function studies No results for input(s): TSH, T4TOTAL, T3FREE, THYROIDAB in the last 72 hours.  Invalid input(s): FREET3 Anemia work up No results for input(s): VITAMINB12, FOLATE, FERRITIN, TIBC, IRON, RETICCTPCT in the last 72 hours. Urinalysis    Component Value Date/Time   COLORURINE YELLOW 05/27/2017 2120   APPEARANCEUR HAZY (A) 05/27/2017 2120   LABSPEC 1.010 05/27/2017 2120   PHURINE 5.0 05/27/2017 2120   GLUCOSEU 50 (A) 05/27/2017 2120   HGBUR MODERATE (A) 05/27/2017 2120   BILIRUBINUR NEGATIVE 05/27/2017 2120   KETONESUR NEGATIVE 05/27/2017 2120   PROTEINUR NEGATIVE 05/27/2017 2120   UROBILINOGEN 1.0 06/24/2015 1940   NITRITE NEGATIVE 05/27/2017 2120   LEUKOCYTESUR SMALL (A) 05/27/2017 2120   Sepsis Labs Invalid input(s): PROCALCITONIN,  WBC,   LACTICIDVEN Microbiology Recent Results (from the past 240 hour(s))  Culture, blood (Routine x 2)     Status: None (Preliminary result)   Collection Time: 05/27/17  8:51 PM  Result Value Ref Range Status   Specimen Description BLOOD RIGHT HAND  Final   Special Requests   Final    BOTTLES DRAWN AEROBIC AND ANAEROBIC Blood Culture adequate volume   Culture   Final    NO GROWTH 3 DAYS Performed at [facility], [place] [address], [place], [place] [address]    Report Status PENDING  Incomplete  Culture, blood (Routine x 2)     Status: None (Preliminary result)   Collection Time: 05/27/17  9:06 PM  Result Value Ref Range Status   Specimen Description BLOOD LEFT ARM  Final   Special Requests   Final    BOTTLES DRAWN AEROBIC AND ANAEROBIC Blood Culture adequate volume   Culture   Final    NO GROWTH 3 DAYS Performed at [facility], [address] [place], [place] [address]    Report Status PENDING  Incomplete  Urine culture     Status: Abnormal   Collection Time: 05/27/17  9:20 PM  Result Value Ref Range Status   Specimen Description URINE, CLEAN CATCH  Final   Special Requests NONE  Final   Culture >=100,000 COLONIES/mL ESCHERICHIA COLI (A)  Final   Report Status 05/30/2017 FINAL  Final   Organism ID, Bacteria ESCHERICHIA COLI (A)  Final      Susceptibility   Escherichia coli - MIC*    AMPICILLIN >=32 RESISTANT Resistant     CEFAZOLIN >=64 RESISTANT Resistant     CEFTRIAXONE <=1 SENSITIVE Sensitive     CIPROFLOXACIN <=0.25 SENSITIVE Sensitive     GENTAMICIN <=1 SENSITIVE Sensitive     IMIPENEM <=0.25 SENSITIVE Sensitive     NITROFURANTOIN <=16 SENSITIVE Sensitive  TRIMETH/SULFA <=20 SENSITIVE Sensitive     AMPICILLIN/SULBACTAM >=32 RESISTANT Resistant     PIP/TAZO 8 SENSITIVE Sensitive     Extended ESBL NEGATIVE Sensitive     * >=100,000 COLONIES/mL ESCHERICHIA COLI   Time coordinating discharge: 35 minutes  SIGNED:  Kerney Elbe, DO [facility] 05/31/2017, 2:36 PM Pager [phone]  If 7PM-7AM, please contact night-coverage [email or web address] Password [email or web address]

## 2017-05-31 NOTE — Progress Notes (Signed)
Report given to [facility]([phone]). Waiting on PTAR to transport patient

## 2017-05-31 NOTE — Progress Notes (Signed)
Occupational Therapy Treatment Patient Details Name: Marvin BANGHART Sr. MRN: [id number] DOB: [birth year] Today's Date: 05/31/2017    History of present illness Pt is a 83 y.o. male with medical history significant of dementia, diabetes, mellitus, hypertension, hyperlipidemia, history of prostate cancer, and history of PE on chronic anticoagulation. He presented to the ED with R sided weakness and fever. MRI negative for acute infarct. Pt has elected not to go forward with surgical intervention.    OT comments  Pt making progress towards OT goals this session. Focus was on use of lift equipment for transfer to bathroom as the Pt needed to have a BM. Pt able to provide some assist with sit<>stand transfers necessary to use STEDY. Pt demonstrating increased ability during bed mobility this session and continued improved cognition. OT will continue to follow and Pt still requires SNF level therapy upon dc to maximize safety and independence in ADL and functional transfers.   Follow Up Recommendations  SNF;Supervision/Assistance - 24 hour    Equipment Recommendations  Other (comment) (defer to next venue)    Recommendations for Other Services      Precautions / Restrictions Precautions Precautions: Fall Restrictions Weight Bearing Restrictions: No       Mobility Bed Mobility Overal bed mobility: Needs Assistance Bed Mobility: Supine to Sit     Supine to sit: Mod assist;HOB elevated     General bed mobility comments: Moderate assist to elevate trunk to upright position and for verbal cues sequencing, use of bed pad to assist Pt to bring hips EOB, min A to remain seated EOB for balance  Transfers Overall transfer level: Needs assistance   Transfers: Sit to/from Stand Sit to Stand: Mod assist;+2 physical assistance         General transfer comment: STEDY used for safety, vc for hand placement    Balance Overall balance assessment: Needs assistance;History of  Falls Sitting-balance support: Feet supported;No upper extremity supported Sitting balance-Leahy Scale: Poor Sitting balance - Comments: min A for balance sitting EOB   Standing balance support: During functional activity;Bilateral upper extremity supported Standing balance-Leahy Scale: Poor Standing balance comment: heavy reliance on physical assist                           ADL either performed or assessed with clinical judgement   ADL Overall ADL's : Needs assistance/impaired Eating/Feeding: Supervision/ safety;Sitting;With adaptive utensils Eating/Feeding Details (indicate cue type and reason): using red built up handle again today.                     Toilet Transfer: Moderate assistance;+2 for physical assistance;Comfort height toilet (STEDY) Toilet Transfer Details (indicate cue type and reason): STEDY used to facilitate transfer with NT           General ADL Comments: improved motor coordination. Right side continues to be very sore, but able to place on STEDY to assist for standing     Vision       Perception     Praxis      Cognition Arousal/Alertness: Awake/alert Behavior During Therapy: [facility] for tasks assessed/performed Overall Cognitive Status: History of cognitive impairments - at baseline                                          Exercises     Shoulder Instructions  General Comments caregiver present this session, transfers performed with NT    Pertinent Vitals/ Pain       Pain Assessment: Faces Faces Pain Scale: Hurts even more Pain Location: R neck, shoulder, and side Pain Descriptors / Indicators: Aching;Tightness;Sharp Pain Intervention(s): Repositioned;Premedicated before session;Monitored during session  Home Living                                          Prior Functioning/Environment              Frequency  Min 2X/week        Progress Toward Goals  OT  Goals(current goals can now be found in the care plan section)  Progress towards OT goals: Progressing toward goals  Acute Rehab OT Goals Patient Stated Goal: to get to the bathroom for a BM OT Goal Formulation: With patient Time For Goal Achievement: 06/11/17 Potential to Achieve Goals: Good  Plan Discharge plan remains appropriate;Frequency remains appropriate    Co-evaluation                 AM-PAC PT "6 Clicks" Daily Activity     Outcome Measure   Help from another person eating meals?: A Little Help from another person taking care of personal grooming?: A Little Help from another person toileting, which includes using toliet, bedpan, or urinal?: A Lot Help from another person bathing (including washing, rinsing, drying)?: A Lot Help from another person to put on and taking off regular upper body clothing?: A Little Help from another person to put on and taking off regular lower body clothing?: A Lot 6 Click Score: 15    End of Session Equipment Utilized During Treatment: Gait belt;Other (comment) (STEDY)  OT Visit Diagnosis: Unsteadiness on feet (R26.81);Muscle weakness (generalized) (M62.81);Ataxia, unspecified (R27.0)   Activity Tolerance Patient tolerated treatment well   Patient Left Other (comment) (in bathroom on toilet)   Nurse Communication Mobility status;Need for lift equipment        Time: 1203-1215 OT Time Calculation (min): 12 min  Charges: OT General Charges $OT Visit: 1 Procedure OT Treatments $Self Care/Home Management : 8-22 mins  Hulda Humphrey OTR/L [place] 05/31/2017, 2:32 PM

## 2017-05-31 NOTE — Progress Notes (Signed)
Discharge to: [facility] Anticipated discharge date: 05/31/17 Family notified: Yes, by phone Transportation by: PTAR  Report #: [phone]  [place] signing off.  Laveda Abbe LCSW [phone]

## 2017-05-31 NOTE — Care Management Important Message (Signed)
Important Message  Patient Details  Name: Marvin HALLORAN Sr. MRN: [id number] Date of Birth: 05/15/1920   Medicare Important Message Given:  Yes    Oryn Casanova Abena 05/31/2017, 11:13 AM

## 2017-06-01 LAB — CULTURE, BLOOD (ROUTINE X 2)
CULTURE: NO GROWTH
Culture: NO GROWTH
SPECIAL REQUESTS: ADEQUATE
SPECIAL REQUESTS: ADEQUATE

## 2017-08-06 ENCOUNTER — Other Ambulatory Visit: Payer: Self-pay | Admitting: Oncology

## 2017-08-06 DIAGNOSIS — D472 Monoclonal gammopathy: Secondary | ICD-10-CM

## 2017-08-07 ENCOUNTER — Telehealth: Payer: Self-pay | Admitting: Oncology

## 2017-08-07 ENCOUNTER — Encounter: Payer: Self-pay | Admitting: *Deleted

## 2017-08-07 NOTE — Telephone Encounter (Signed)
Scheduled appt - patient is aware  Of apt date and time.

## 2017-08-27 ENCOUNTER — Ambulatory Visit: Payer: Medicare Other | Admitting: Podiatry

## 2017-08-27 ENCOUNTER — Encounter: Payer: Self-pay | Admitting: Podiatry

## 2017-08-27 DIAGNOSIS — B351 Tinea unguium: Secondary | ICD-10-CM | POA: Diagnosis not present

## 2017-08-27 DIAGNOSIS — D689 Coagulation defect, unspecified: Secondary | ICD-10-CM

## 2017-08-27 DIAGNOSIS — E1151 Type 2 diabetes mellitus with diabetic peripheral angiopathy without gangrene: Secondary | ICD-10-CM | POA: Diagnosis not present

## 2017-08-27 DIAGNOSIS — M79676 Pain in unspecified toe(s): Secondary | ICD-10-CM

## 2017-08-27 NOTE — Progress Notes (Signed)
Patient ID: Marvin Bishop Sr., male   DOB: [birth year], 83 y.o.   MRN: [id number] HPI  Complaint:  Visit Type: Patient returns to my office for continued preventative foot care services. Complaint: Patient states" my nails have grown long and thick and become painful to walk and wear shoes" Patient has been diagnosed with DM . This patient  presents for preventative foot care services. No changes to ROS.  Patient is taking eliquiss.  Podiatric Exam: Vascular: dorsalis pedis and posterior tibial pulses are non-palpable. Capillary return is diminished. Cold feet noted.. Skin turgor WNL, bilateral swelling  Sensorium: Diminished  Semmes Weinstein monofilament test. Normal tactile sensation bilaterally.  Nail Exam: Pt has thick disfigured discolored nails with subungual debris noted bilateral entire nail hallux through fifth toenails Ulcer Exam: There is no evidence of ulcer or pre-ulcerative changes or infection. Orthopedic Exam: Muscle tone and strength are WNL. No limitations in general ROM. No crepitus or effusions noted. Foot type and digits show no abnormalities. Bony prominences are unremarkable. Skin: No Porokeratosis. No infection or ulcers  Diagnosis:  Onychomycosis, Pain in right toe, pain in left toes  Treatment & Plan Procedures and Treatment: Consent by patient was obtained for treatment procedures. The patient understood the discussion of treatment and procedures well. All questions were answered thoroughly reviewed. Debridement of mycotic and hypertrophic toenails, 1 through 5 bilateral and clearing of subungual debris. No ulceration, no infection noted.  Return Visit-Office Procedure: Patient instructed to return to the office for a follow up visit 3 months for continued evaluation and treatment.    Gardiner Barefoot DPM

## 2017-09-11 ENCOUNTER — Ambulatory Visit (HOSPITAL_BASED_OUTPATIENT_CLINIC_OR_DEPARTMENT_OTHER): Payer: Medicare Other | Admitting: Oncology

## 2017-09-11 ENCOUNTER — Other Ambulatory Visit (HOSPITAL_BASED_OUTPATIENT_CLINIC_OR_DEPARTMENT_OTHER): Payer: Medicare Other

## 2017-09-11 VITALS — BP 113/64 | HR 94 | Temp 97.8°F | Resp 18 | Ht 75.0 in

## 2017-09-11 DIAGNOSIS — D472 Monoclonal gammopathy: Secondary | ICD-10-CM | POA: Diagnosis not present

## 2017-09-11 DIAGNOSIS — I2699 Other pulmonary embolism without acute cor pulmonale: Secondary | ICD-10-CM | POA: Diagnosis not present

## 2017-09-11 DIAGNOSIS — N289 Disorder of kidney and ureter, unspecified: Secondary | ICD-10-CM | POA: Diagnosis not present

## 2017-09-11 LAB — COMPREHENSIVE METABOLIC PANEL
ALBUMIN: 3.2 g/dL — AB (ref 3.5–5.0)
ALK PHOS: 84 U/L (ref 40–150)
ALT: 15 U/L (ref 0–55)
AST: 18 U/L (ref 5–34)
Anion Gap: 8 mEq/L (ref 3–11)
BUN: 21.8 mg/dL (ref 7.0–26.0)
CALCIUM: 9.4 mg/dL (ref 8.4–10.4)
CHLORIDE: 105 meq/L (ref 98–109)
CO2: 28 mEq/L (ref 22–29)
Creatinine: 1.8 mg/dL — ABNORMAL HIGH (ref 0.7–1.3)
EGFR: 37 mL/min/{1.73_m2} — AB (ref >60)
Glucose: 228 mg/dl — ABNORMAL HIGH (ref 70–140)
POTASSIUM: 4.1 meq/L (ref 3.5–5.1)
Sodium: 141 mEq/L (ref 136–145)
Total Bilirubin: 0.58 mg/dL (ref 0.20–1.20)
Total Protein: 7.3 g/dL (ref 6.4–8.3)

## 2017-09-11 LAB — CBC WITH DIFFERENTIAL/PLATELET
BASO%: 0.5 % (ref 0.0–2.0)
BASOS ABS: 0 10*3/uL (ref 0.0–0.1)
EOS ABS: 0.2 10*3/uL (ref 0.0–0.5)
EOS%: 2.2 % (ref 0.0–7.0)
HEMATOCRIT: 40.2 % (ref 38.4–49.9)
HEMOGLOBIN: 13 g/dL (ref 13.0–17.1)
LYMPH%: 17.8 % (ref 14.0–49.0)
MCH: 26.5 pg — AB (ref 27.2–33.4)
MCHC: 32.4 g/dL (ref 32.0–36.0)
MCV: 81.8 fL (ref 79.3–98.0)
MONO#: 0.7 10*3/uL (ref 0.1–0.9)
MONO%: 9.6 % (ref 0.0–14.0)
NEUT#: 5.1 10*3/uL (ref 1.5–6.5)
NEUT%: 69.9 % (ref 39.0–75.0)
Platelets: 211 10*3/uL (ref 140–400)
RBC: 4.92 10*6/uL (ref 4.20–5.82)
RDW: 16.2 % — AB (ref 11.0–14.6)
WBC: 7.3 10*3/uL (ref 4.0–10.3)
lymph#: 1.3 10*3/uL (ref 0.9–3.3)

## 2017-09-11 NOTE — Progress Notes (Signed)
Hematology and Oncology Follow Up Visit  Marvin Bishop [id number] 07-08-1920 83 y.o. 09/11/2017 3:34 PM   Principle Diagnosis: 83 year old gentleman with monoclonal gammopathy with IgG kappa subtype diagnosed in July 2016. He was found to have an M spike of 0.6 g/dL and a normal IgG level. He had an elevated light chain ratio. No evidence of clear-cut end organ damage on his initial workup.  Current therapy: Observation and surveillance.  Interim History:  Mr. Marvin Bishop presents today for a follow-up visit with his family. Since the last visit, he was hospitalized in August 2018 for TIA and a right-sided arm weakness. These issues has resolved and has fully recovered to his baseline at this time. He denied any recent back pain, bone pain or pathological fractures. His performance status is rather limited although remains at baseline. His appetite remains reasonable.   He was evaluated for renal insufficiency and his workup revealed a IgM kappa up to 0.9 g/dL. He has an elevated free kappa and lambda light chain with increased kappa to lambda ratio. I was asked to comment about these findings.  He does not report any headaches, blurry vision, syncope or seizures. He does not report any fevers or chills or sweats. He does not report any cough or hemoptysis or hematemesis. Does not report any nausea, vomiting or abdominal pain. Does not report any constipation, diarrhea or hematochezia. He does not report any frequency urgency or hesitancy. He does not report any skeletal complaints. Remainder review of systems unremarkable.   Medications: I have reviewed the patient's current medications.  Current Outpatient Medications  Medication Sig Dispense Refill  . acetaminophen (TYLENOL) 325 MG tablet Take 2 tablets (650 mg total) by mouth every 4 (four) hours as needed for mild pain (or temp > 37.5 C (99.5 F)). 30 tablet 0  . apixaban (ELIQUIS) 5 MG TABS tablet Take 1 tablet (5 mg total) by mouth 2 (two)  times daily. 60 tablet 0  . BD PEN NEEDLE NANO U/F 32G X 4 MM MISC See admin instructions.  4  . Carboxymethylcellul-Glycerin 0.5-0.9 % SOLN Apply 1 drop to eye daily after breakfast.     . cefpodoxime (VANTIN) 200 MG tablet Take 1 tablet (200 mg total) by mouth every 12 (twelve) hours. 6 tablet 0  . docusate sodium 100 MG CAPS Take 200 mg by mouth 2 (two) times daily as needed for mild constipation. (Patient taking differently: Take 200 mg by mouth 3 (three) times a week. ) 25 capsule 0  . erythromycin ophthalmic ointment Place 1 application into the right eye at bedtime.  12  . esomeprazole (NEXIUM) 40 MG capsule Take 1 capsule (40 mg total) by mouth 2 (two) times daily before a meal. 60 capsule 1  . famotidine (PEPCID) 20 MG tablet Take 1 tablet (20 mg total) by mouth at bedtime. 30 tablet 1  . furosemide (LASIX) 40 MG tablet Take 40 mg by mouth daily after breakfast.     . gabapentin (NEURONTIN) 100 MG capsule TAKE 2 CAPSULES BY MOUTH EVERY DAY AT BEDTIME  11  . glipiZIDE (GLUCOTROL XL) 10 MG 24 hr tablet Take 10 mg by mouth daily with breakfast.     . HUMALOG KWIKPEN 100 UNIT/ML KiwkPen Inject 2-7 Units into the skin 2 (two) times daily with a meal. 2 units if sugar level 150-200 and 7 units when above 200    . insulin detemir (LEVEMIR) 100 UNIT/ML injection Inject 0.1 mLs (10 Units total) into the skin daily  at 10 pm. (Patient taking differently: Inject 10 Units into the skin 2 (two) times daily. ) 10 mL 11  . Loratadine (CLARITIN) 10 MG CAPS Take 10 mg by mouth daily after breakfast.     . memantine (NAMENDA) 10 MG tablet Take 1 tablet (10 mg total) by mouth 2 (two) times daily. Please call [phone] to schedule yearly appt. 180 tablet 0  . metoprolol tartrate (LOPRESSOR) 25 MG tablet Take 12.5 mg by mouth 2 (two) times daily.     . Multiple Vitamins-Minerals (ELDERTONIC PO) Take 15 mLs by mouth 2 (two) times daily.    . polyethylene glycol (MIRALAX / GLYCOLAX) packet Take 17 g by mouth daily  as needed for mild constipation. Takes on MW  6  . senna-docusate (SENOKOT-S) 8.6-50 MG tablet Take 1 tablet by mouth at bedtime as needed for moderate constipation. 30 tablet 0  . simvastatin (ZOCOR) 40 MG tablet Take 40 mg by mouth every morning.      No current facility-administered medications for this visit.      Allergies:  Allergies  Allergen Reactions  . Donepezil Nausea And Vomiting  . Hydrocodone Nausea And Vomiting  . Hydrocodone-Acetaminophen Nausea Only    Past Medical History, Surgical history, Social history, and Family History were reviewed and updated.  Physical Exam: Blood pressure 113/64, pulse 94, temperature 97.8 F (36.6 C), temperature source Oral, resp. rate 18, height 6\' 3"  (1.905 m), SpO2 100 %. ECOG: 2 General appearance: Alert, awake gentleman appeared without distress. He has dementia and not able to give much history. Head: Normocephalic, without obvious abnormality no oral thrush or ulcers. Neck: no adenopathy Lymph nodes: Cervical, supraclavicular, and axillary nodes normal. Heart:regular rate and rhythm, S1, S2 normal, no murmur, click, rub or gallop Lung:chest clear, no wheezing, rales, normal symmetric air entry Abdomin: soft, non-tender, without masses or organomegaly. No tenderness or ascites. EXT: Edema noted bilaterally.   Lab Results: Lab Results  Component Value Date   WBC 7.3 09/11/2017   HGB 13.0 09/11/2017   HCT 40.2 09/11/2017   MCV 81.8 09/11/2017   PLT 211 09/11/2017     Chemistry      Component Value Date/Time   NA 139 05/31/2017 0517   NA 142 02/27/2017 1501   K 3.1 (L) 05/31/2017 0517   K 4.2 02/27/2017 1501   CL 107 05/31/2017 0517   CO2 28 05/31/2017 0517   CO2 29 02/27/2017 1501   BUN 11 05/31/2017 0517   BUN 22.8 02/27/2017 1501   CREATININE 1.23 05/31/2017 0517   CREATININE 1.6 (H) 02/27/2017 1501      Component Value Date/Time   CALCIUM 8.5 (L) 05/31/2017 0517   CALCIUM 9.6 02/27/2017 1501   ALKPHOS 50  05/31/2017 0517   ALKPHOS 86 02/27/2017 1501   AST 20 05/31/2017 0517   AST 19 02/27/2017 1501   ALT 16 (L) 05/31/2017 0517   ALT 15 02/27/2017 1501   BILITOT 1.2 05/31/2017 0517   BILITOT 0.60 02/27/2017 1501     Results for LEIB, ELAHI SR. (MRN [id number]) as of 09/11/2017 15:22  Ref. Range 11/17/2015 15:21 07/24/2016 15:03 02/27/2017 15:01  M Protein SerPl Elph-Mcnc Latest Ref Range: Not Observed g/dL 0.6 (H) 0.8 (H) 0.8 (H)   Results for LONDYN, WOTTON (MRN [id number]) as of 09/11/2017 15:22  Ref. Range 11/17/2015 15:21 07/24/2016 15:03 02/27/2017 15:01  IgG (Immunoglobin G), Serum Latest Ref Range: 700 - 1600 mg/dL 1,524 1,639 (H) 1,607 (H)   Results for  LACEY, DOTSON (MRN [id number]) as of 09/11/2017 15:22  Ref. Range 11/17/2015 15:21 07/24/2016 15:03 02/27/2017 15:01  IgM, Qn, Serum Latest Ref Range: 15 - 143 mg/dL 117 128 105   Results for REGIS, HINTON (MRN [id number]) as of 09/11/2017 15:22  Ref. Range 11/09/2016 05:48 01/27/2017 09:26 02/27/2017 15:01 05/27/2017 20:49 05/29/2017 11:20 05/30/2017 04:40 05/31/2017 05:17 09/11/2017 15:03  Creatinine Latest Ref Range: 0.7 - 1.3 mg/dL 1.56 (H) 1.78 (H) 1.6 (H) 1.87 (H) 1.31 (H) 1.26 (H) 1.23 1.5 (H)     83 year old gentleman with the following issues:  1. Monoclonal gammopathy with IgG kappa subtype diagnosed in July 2016  after presenting with increased kappa to lambda ratio of 2.68. These findings support the diagnosis of monoclonal gammopathy of undetermined significance rather than active myeloma.   Repeat her protein studies obtained as a workup for his renal insufficiency showed similar findings stable at 2016. I see no evidence to suggest active myeloma at this point and no further workup is needed.  Given his age and overall mental decline, I recommended no further surveillance from that standpoint.  2. Renal insufficiency: Likely unrelated to a plasma cell disorder and could be related to his long-standing  diabetes.   3. Pulmonary embolism: Currently anticoagulated with warfarin.  4. Follow-up: I'm happy to see him in the future as needed.  Zola Button, MD 11/28/20183:34 PM

## 2017-09-12 LAB — KAPPA/LAMBDA LIGHT CHAINS
Ig Kappa Free Light Chain: 106.4 mg/L — ABNORMAL HIGH (ref 3.3–19.4)
Ig Lambda Free Light Chain: 29.4 mg/L — ABNORMAL HIGH (ref 5.7–26.3)
KAPPA/LAMBDA FLC RATIO: 3.62 — AB (ref 0.26–1.65)

## 2017-09-13 LAB — MULTIPLE MYELOMA PANEL, SERUM
ALBUMIN SERPL ELPH-MCNC: 2.9 g/dL (ref 2.9–4.4)
ALBUMIN/GLOB SERPL: 0.9 (ref 0.7–1.7)
Alpha 1: 0.2 g/dL (ref 0.0–0.4)
Alpha2 Glob SerPl Elph-Mcnc: 0.9 g/dL (ref 0.4–1.0)
B-GLOBULIN SERPL ELPH-MCNC: 0.9 g/dL (ref 0.7–1.3)
GAMMA GLOB SERPL ELPH-MCNC: 1.6 g/dL (ref 0.4–1.8)
GLOBULIN, TOTAL: 3.6 g/dL (ref 2.2–3.9)
IGA/IMMUNOGLOBULIN A, SERUM: 209 mg/dL (ref 61–437)
IgG, Qn, Serum: 1783 mg/dL — ABNORMAL HIGH (ref 700–1600)
IgM, Qn, Serum: 108 mg/dL (ref 15–143)
M PROTEIN SERPL ELPH-MCNC: 0.7 g/dL — AB
Total Protein: 6.5 g/dL (ref 6.0–8.5)

## 2017-10-30 ENCOUNTER — Encounter: Payer: Self-pay | Admitting: Podiatry

## 2017-10-30 ENCOUNTER — Ambulatory Visit: Payer: Medicare Other | Admitting: Podiatry

## 2017-10-30 DIAGNOSIS — M79676 Pain in unspecified toe(s): Secondary | ICD-10-CM | POA: Diagnosis not present

## 2017-10-30 DIAGNOSIS — E1151 Type 2 diabetes mellitus with diabetic peripheral angiopathy without gangrene: Secondary | ICD-10-CM | POA: Diagnosis not present

## 2017-10-30 DIAGNOSIS — L03032 Cellulitis of left toe: Secondary | ICD-10-CM | POA: Diagnosis not present

## 2017-10-30 DIAGNOSIS — B351 Tinea unguium: Secondary | ICD-10-CM | POA: Diagnosis not present

## 2017-10-30 DIAGNOSIS — D689 Coagulation defect, unspecified: Secondary | ICD-10-CM | POA: Diagnosis not present

## 2017-10-30 NOTE — Progress Notes (Signed)
Patient ID: [name], male   DOB: 1920-06-18, 84 y.o.   MRN: [id number] HPI  Complaint:  Visit Type: Patient returns to my office for continued preventative foot care services. Complaint: Patient states" my nails have grown long and thick and become painful to walk and wear shoes" Patient has been diagnosed with DM . This patient  presents for preventative foot care services. No changes to ROS.  Patient is taking eliquiss.  Patient has painful third toenail left foot.  [facility]ra [facility].  Podiatric Exam: Vascular: dorsalis pedis and posterior tibial pulses are non-palpable. Capillary return is diminished. Cold feet noted.. Skin turgor WNL, bilateral swelling  Sensorium: Diminished  Semmes Weinstein monofilament test. Normal tactile sensation bilaterally.  Nail Exam: Pt has thick disfigured discolored nails with subungual debris noted bilateral entire nail hallux through fifth toenails Ulcer Exam: There is no evidence of ulcer or pre-ulcerative changes or infection. Orthopedic Exam: Muscle tone and strength are WNL. No limitations in general ROM. No crepitus or effusions noted. Foot type and digits show no abnormalities. Bony prominences are unremarkable. Skin: No Porokeratosis. No infection or ulcers  Diagnosis:  Onychomycosis, Pain in right toe, pain in left toes  Treatment & Plan Procedures and Treatment: Consent by patient was obtained for treatment procedures. The patient understood the discussion of treatment and procedures well. All questions were answered thoroughly reviewed. Debridement of mycotic and hypertrophic toenails, 1 through 5 bilateral and clearing of subungual debris. Renoval of offending nail third left foot. No ulceration, no infection noted. Incision and drainage medial border left third toe.  Told to peroxide toe and finish his antibiotics. Return Visit-Office Procedure: Patient instructed to return to the office for a follow up visit 3  months for continued evaluation and treatment.    Gardiner Barefoot DPM

## 2017-11-26 ENCOUNTER — Ambulatory Visit: Payer: Medicare Other | Admitting: Podiatry

## 2017-11-27 ENCOUNTER — Ambulatory Visit: Payer: Medicare Other | Admitting: Podiatry

## 2018-01-12 ENCOUNTER — Encounter (HOSPITAL_COMMUNITY): Payer: Self-pay | Admitting: Nurse Practitioner

## 2018-01-12 ENCOUNTER — Emergency Department (HOSPITAL_COMMUNITY)
Admission: EM | Admit: 2018-01-12 | Discharge: 2018-01-13 | Disposition: A | Discharge status: Home / Self Care | Payer: Medicare Other | Attending: Emergency Medicine | Admitting: Emergency Medicine

## 2018-01-12 ENCOUNTER — Emergency Department (HOSPITAL_COMMUNITY): Payer: Medicare Other

## 2018-01-12 DIAGNOSIS — Z794 Long term (current) use of insulin: Secondary | ICD-10-CM | POA: Diagnosis not present

## 2018-01-12 DIAGNOSIS — Z87891 Personal history of nicotine dependence: Secondary | ICD-10-CM | POA: Insufficient documentation

## 2018-01-12 DIAGNOSIS — R531 Weakness: Secondary | ICD-10-CM | POA: Insufficient documentation

## 2018-01-12 DIAGNOSIS — F0390 Unspecified dementia without behavioral disturbance: Secondary | ICD-10-CM | POA: Insufficient documentation

## 2018-01-12 DIAGNOSIS — E1122 Type 2 diabetes mellitus with diabetic chronic kidney disease: Secondary | ICD-10-CM | POA: Insufficient documentation

## 2018-01-12 DIAGNOSIS — R5383 Other fatigue: Secondary | ICD-10-CM | POA: Diagnosis present

## 2018-01-12 DIAGNOSIS — N183 Chronic kidney disease, stage 3 (moderate): Secondary | ICD-10-CM | POA: Insufficient documentation

## 2018-01-12 DIAGNOSIS — I5042 Chronic combined systolic (congestive) and diastolic (congestive) heart failure: Secondary | ICD-10-CM | POA: Insufficient documentation

## 2018-01-12 DIAGNOSIS — I130 Hypertensive heart and chronic kidney disease with heart failure and stage 1 through stage 4 chronic kidney disease, or unspecified chronic kidney disease: Secondary | ICD-10-CM | POA: Diagnosis not present

## 2018-01-12 DIAGNOSIS — J45909 Unspecified asthma, uncomplicated: Secondary | ICD-10-CM | POA: Diagnosis not present

## 2018-01-12 DIAGNOSIS — Z8546 Personal history of malignant neoplasm of prostate: Secondary | ICD-10-CM | POA: Insufficient documentation

## 2018-01-12 DIAGNOSIS — Z7901 Long term (current) use of anticoagulants: Secondary | ICD-10-CM | POA: Diagnosis not present

## 2018-01-12 DIAGNOSIS — Z79899 Other long term (current) drug therapy: Secondary | ICD-10-CM | POA: Insufficient documentation

## 2018-01-12 NOTE — ED Triage Notes (Signed)
Pt is presented by family member, reports that they have been advised to come in by the on call HF clinic nurse to r/o HF excerebration, pt is c/o feeling fatigued.

## 2018-01-13 LAB — BASIC METABOLIC PANEL
ANION GAP: 6 (ref 5–15)
BUN: 26 mg/dL — AB (ref 6–20)
CHLORIDE: 107 mmol/L (ref 101–111)
CO2: 26 mmol/L (ref 22–32)
Calcium: 8.8 mg/dL — ABNORMAL LOW (ref 8.9–10.3)
Creatinine, Ser: 1.61 mg/dL — ABNORMAL HIGH (ref 0.61–1.24)
GFR calc Af Amer: 40 mL/min — ABNORMAL LOW (ref >60)
GFR calc non Af Amer: 35 mL/min — ABNORMAL LOW (ref >60)
GLUCOSE: 176 mg/dL — AB (ref 65–99)
Potassium: 3.9 mmol/L (ref 3.5–5.1)
Sodium: 139 mmol/L (ref 135–145)

## 2018-01-13 LAB — URINALYSIS, ROUTINE W REFLEX MICROSCOPIC
Bilirubin Urine: NEGATIVE
GLUCOSE, UA: NEGATIVE mg/dL
HGB URINE DIPSTICK: NEGATIVE
KETONES UR: NEGATIVE mg/dL
Leukocytes, UA: NEGATIVE
Nitrite: NEGATIVE
PH: 5 (ref 5.0–8.0)
Protein, ur: NEGATIVE mg/dL
Specific Gravity, Urine: 1.01 (ref 1.005–1.030)

## 2018-01-13 LAB — CBC WITH DIFFERENTIAL/PLATELET
BASOS ABS: 0 10*3/uL (ref 0.0–0.1)
Basophils Relative: 0 %
EOS PCT: 3 %
Eosinophils Absolute: 0.2 10*3/uL (ref 0.0–0.7)
HEMATOCRIT: 37.1 % — AB (ref 39.0–52.0)
Hemoglobin: 11.9 g/dL — ABNORMAL LOW (ref 13.0–17.0)
LYMPHS ABS: 1.3 10*3/uL (ref 0.7–4.0)
LYMPHS PCT: 21 %
MCH: 27 pg (ref 26.0–34.0)
MCHC: 32.1 g/dL (ref 30.0–36.0)
MCV: 84.1 fL (ref 78.0–100.0)
MONO ABS: 1.1 10*3/uL — AB (ref 0.1–1.0)
Monocytes Relative: 18 %
NEUTROS ABS: 3.5 10*3/uL (ref 1.7–7.7)
Neutrophils Relative %: 58 %
PLATELETS: 190 10*3/uL (ref 150–400)
RBC: 4.41 MIL/uL (ref 4.22–5.81)
RDW: 15.4 % (ref 11.5–15.5)
WBC: 6 10*3/uL (ref 4.0–10.5)

## 2018-01-13 MED ORDER — FUROSEMIDE 10 MG/ML IJ SOLN
40.0000 mg | Freq: Once | INTRAMUSCULAR | Status: AC
  Administered 2018-01-13: 40 mg via INTRAVENOUS
  Filled 2018-01-13: qty 4

## 2018-01-13 NOTE — ED Notes (Signed)
Pt attempted to urinate for specimen and was unable. Left pt with urinal at bedside. Will check back.

## 2018-01-13 NOTE — ED Notes (Signed)
Pt able to void. Sending specimen off for analysis once collected.

## 2018-01-13 NOTE — Discharge Instructions (Signed)

## 2018-01-13 NOTE — ED Notes (Signed)
Checked back with pt to get urine specimen. Notified by pt and family member pt unable to void. Pt relative stated that provider mentioned catheter placement.

## 2018-01-13 NOTE — ED Provider Notes (Signed)
[place] DEPT Provider Note   CSN: [id number] Arrival date & time: 01/12/18  2147     History   Chief Complaint Chief Complaint  Patient presents with  . Fatigue   Level 5 caveat due to dementia  HPI Marvin MCCASTER Sr. is a 84 y.o. male.  The history is provided by the patient and a relative.  Weakness  This is a new problem. The current episode started more than 1 week ago. The problem has been gradually worsening. There was no focality noted. There has been no fever. Associated symptoms include shortness of breath. Pertinent negatives include no chest pain and no vomiting.  History of dementia, hypertension, heart failure presents with increased weakness. Daughter provides most of the history.  Patient lives at home with 24-hour care.  Over the past week or so is been having increasing weakness.  No focal weakness.  No falls.  There is also reported increased swelling in his legs.  Some mild shortness of breath.  No fevers or vomiting. Past Medical History:  Diagnosis Date  . Arthritis   . Asthma    hx of as child  . Complete loss of vision    right eye  . Dementia   . Diabetes mellitus   . Headache(784.0)    slight  . Hypercholesteremia   . Hypertension   . Insomnia    occasional  . Memory loss   . Nephrolithiasis    right UVJ stone  . Peripheral neuropathy    both legs  . PONV (postoperative nausea and vomiting)   . Prostate cancer [facility])     Patient Active Problem List   Diagnosis Date Noted  . Hypophosphatemia 05/31/2017  . TIA (transient ischemic attack) 05/27/2017  . Sepsis ([place]) 05/27/2017  . CKD (chronic kidney disease), stage III ([place]) 05/27/2017  . Influenza 11/08/2016  . Chronic combined systolic and diastolic congestive heart failure ([place]) 11/08/2016  . History of pulmonary embolus (PE) 11/08/2016  . PE (pulmonary embolism) 04/07/2015  . Pulmonary embolism ([place]) 04/07/2015  . Elevated troponin 04/07/2015  .  Cough   . Pulmonary emboli ([place])   . Sinus tachycardia 04/01/2015  . Acid reflux 03/07/2015  . Blind painful eye 01/07/2015  . Ileus ([place]) 08/23/2014  . Dementia without behavioral disturbance 09/02/2013  . Renal calculus or stone 08/22/2013  . Hypokalemia 08/22/2013  . Hypertension 08/22/2013  . IDDM (insulin dependent diabetes mellitus) ([place]) 08/22/2013  . Dehydration 08/22/2013  . Memory loss 04/23/2013  . Insomnia   . Carpal tunnel syndrome   . Diverticulosis     Past Surgical History:  Procedure Laterality Date  . APPENDECTOMY    . Cornea removed Right 02-26-15  . CYSTOSCOPY W/ URETERAL STENT PLACEMENT Right 10/01/2013   Procedure: cystoscopy right retrograde holmium laser lithotripsy;  Surgeon: Franchot Gallo, MD;  Location: [facility];  Service: Urology;  Laterality: Right;  holmium laser application  . HEMORROIDECTOMY    . KNEE ARTHROSCOPY Right   . PARS PLANA VITRECTOMY  10/24/2011   Procedure: PARS PLANA VITRECTOMY WITH 23 GAUGE;  Surgeon: Adonis Brook, MD;  Location: [facility];  Service: Ophthalmology;  Laterality: Right;  . TONSILLECTOMY  as teen        Home Medications    Prior to Admission medications   Medication Sig Start Date End Date Taking? Authorizing Provider  acetaminophen (TYLENOL) 325 MG tablet Take 2 tablets (650 mg total) by mouth every 4 (four) hours as needed for mild pain (or  temp > 37.5 C (99.5 F)). 05/31/17   Raiford Noble Latif, DO  apixaban (ELIQUIS) 5 MG TABS tablet Take 1 tablet (5 mg total) by mouth 2 (two) times daily. 05/31/17   Sheikh, Georgina Quint Latif, DO  BD PEN NEEDLE NANO U/F 32G X 4 MM MISC See admin instructions. 07/13/15   [provider]  Carboxymethylcellul-Glycerin 0.5-0.9 % SOLN Apply 1 drop to eye daily after breakfast.     [provider]  cefpodoxime (VANTIN) 200 MG tablet Take 1 tablet (200 mg total) by mouth every 12 (twelve) hours. 05/31/17   Raiford Noble Latif, DO  docusate sodium 100 MG CAPS Take 200 mg by mouth 2  (two) times daily as needed for mild constipation. Patient taking differently: Take 200 mg by mouth 3 (three) times a week.  08/25/14   Thurnell Lose, MD  erythromycin ophthalmic ointment Place 1 application into the right eye at bedtime. 05/03/17   [provider]  esomeprazole (NEXIUM) 40 MG capsule Take 1 capsule (40 mg total) by mouth 2 (two) times daily before a meal. 04/04/15   Barton Dubois, MD  famotidine (PEPCID) 20 MG tablet Take 1 tablet (20 mg total) by mouth at bedtime. 04/04/15   Barton Dubois, MD  furosemide (LASIX) 40 MG tablet Take 40 mg by mouth daily after breakfast.     [provider]  gabapentin (NEURONTIN) 100 MG capsule TAKE 2 CAPSULES BY MOUTH EVERY DAY AT BEDTIME 09/21/15   [provider]  glipiZIDE (GLUCOTROL XL) 10 MG 24 hr tablet Take 10 mg by mouth daily with breakfast.     [provider]  HUMALOG KWIKPEN 100 UNIT/ML KiwkPen Inject 2-7 Units into the skin 2 (two) times daily with a meal. 2 units if sugar level 150-200 and 7 units when above 200 06/08/16   [provider]  insulin detemir (LEVEMIR) 100 UNIT/ML injection Inject 0.1 mLs (10 Units total) into the skin daily at 10 pm. Patient taking differently: Inject 10 Units into the skin 2 (two) times daily.  04/10/15   Elgergawy, Silver Huguenin, MD  Loratadine (CLARITIN) 10 MG CAPS Take 10 mg by mouth daily after breakfast.     [provider]  memantine (NAMENDA) 10 MG tablet Take 1 tablet (10 mg total) by mouth 2 (two) times daily. Please call [phone] to schedule yearly appt. 04/16/17   Marcial Pacas, MD  metoprolol tartrate (LOPRESSOR) 25 MG tablet Take 12.5 mg by mouth 2 (two) times daily.  06/23/15   [provider]  Multiple Vitamins-Minerals (ELDERTONIC PO) Take 15 mLs by mouth 2 (two) times daily.    [provider]  polyethylene glycol (MIRALAX / GLYCOLAX) packet Take 17 g by mouth daily as needed for mild constipation. Takes on [facility] 04/29/15   [provider]  senna-docusate (SENOKOT-S) 8.6-50 MG tablet Take 1 tablet by mouth at bedtime as needed for moderate constipation. 05/31/17   Raiford Noble Latif, DO  simvastatin (ZOCOR) 40 MG tablet Take 40 mg by mouth every morning.     [provider]    Family History Family History  Problem Relation Age of Onset  . Cancer Mother        uterus  . Heart Problems Father   . Cancer Brother        stomach  . Cancer Brother        throat  . Colon cancer Brother     Social History Social History   Tobacco Use  .  Smoking status: Former Smoker    Packs/day: 0.25    Types: Cigarettes    Last attempt to quit: 10/15/1966    Years since quitting: 51.2  . Smokeless tobacco: Never Used  Substance Use Topics  . Alcohol use: Yes    Alcohol/week: 0.0 oz    Comment: occasional  . Drug use: No     Allergies   Donepezil; Hydrocodone; and Hydrocodone-acetaminophen   Review of Systems Review of Systems  Unable to perform ROS: Dementia  Respiratory: Positive for shortness of breath.   Cardiovascular: Negative for chest pain.  Gastrointestinal: Negative for vomiting.  Neurological: Positive for weakness.     Physical Exam Updated Vital Signs BP 104/70 (BP Location: Right Arm)   Pulse 100   Temp 98.5 F (36.9 C) (Oral)   Resp 15   Ht 1.854 m (6\' 1" )   Wt 94.8 kg (209 lb)   SpO2 99%   BMI 27.57 kg/m   Physical Exam CONSTITUTIONAL: Elderly HEAD: Normocephalic/atraumatic ENMT: Mucous membranes moist NECK: supple no meningeal signs SPINE/BACK:entire spine nontender CV: S1/S2 noted, no murmurs/rubs/gallops noted LUNGS: Lungs are clear to auscultation bilaterally, no apparent distress ABDOMEN: soft, nontender GU:no cva tenderness NEURO: Pt is awake/alert/pleasant, moves all extremitiesx4.  No facial droop.   EXTREMITIES: pulses normal/equal, full ROM, mild pitting edema lower extremities, no erythema, no tenderness SKIN: warm, color normal    ED Treatments /  Results  Labs (all labs ordered are listed, but only abnormal results are displayed) Labs Reviewed  BASIC METABOLIC PANEL - Abnormal; Notable for the following components:      Result Value   Glucose, Bld 176 (*)    BUN 26 (*)    Creatinine, Ser 1.61 (*)    Calcium 8.8 (*)    GFR calc non Af Amer 35 (*)    GFR calc Af Amer 40 (*)    All other components within normal limits  CBC WITH DIFFERENTIAL/PLATELET - Abnormal; Notable for the following components:   Hemoglobin 11.9 (*)    HCT 37.1 (*)    Monocytes Absolute 1.1 (*)    All other components within normal limits  URINALYSIS, ROUTINE W REFLEX MICROSCOPIC    EKG EKG Interpretation  Date/Time:  Sunday January 12 2018 23:58:53 EDT Ventricular Rate:  86 PR Interval:    QRS Duration: 96 QT Interval:  374 QTC Calculation: 448 R Axis:   -16 Text Interpretation:  Sinus rhythm Borderline left axis deviation RSR' in V1 or V2, right VCD or RVH No significant change since last tracing Confirmed by Ripley Fraise [phone]) on 01/13/2018 12:03:04 AM   Radiology Dg Chest 2 View  Result Date: 01/13/2018 CLINICAL DATA:  Weakness EXAM: CHEST - 2 VIEW COMPARISON:  05/27/2017 FINDINGS: The heart size and mediastinal contours are within normal limits. Minimal aortic atherosclerosis without aneurysm. Both lungs are clear.Osteoarthritis of the [facility] and glenohumeral joints. IMPRESSION: No active cardiopulmonary disease. Electronically Signed   By: Ashley Royalty M.D.   On: 01/13/2018 01:03    Procedures Procedures (including critical care time)  Medications Ordered in ED Medications  furosemide (LASIX) injection 40 mg (40 mg Intravenous Given 01/13/18 0132)     Initial Impression / Assessment and Plan / ED Course  I have reviewed the triage vital signs and the nursing notes.  Pertinent labs & imaging results that were available during my care of the patient were reviewed by me and considered in my medical decision making (see chart for  details).  Patient in the ER for generalized weakness Labs and imaging near baseline.  No signs of acute CHF. No fever, no signs of infection.  No signs of focal stroke Daughter feels comfortable taking patient home.  Advised, if any worsening lower extremity edema she can add on an additional Lasix per day. Final Clinical Impressions(s) / ED Diagnoses   Final diagnoses:  Weakness    ED Discharge Orders    None       Ripley Fraise, MD 01/13/18 [phone]

## 2018-01-29 ENCOUNTER — Encounter: Payer: Self-pay | Admitting: Podiatry

## 2018-01-29 ENCOUNTER — Ambulatory Visit: Payer: Medicare Other | Admitting: Podiatry

## 2018-01-29 DIAGNOSIS — M79676 Pain in unspecified toe(s): Secondary | ICD-10-CM | POA: Diagnosis not present

## 2018-01-29 DIAGNOSIS — D689 Coagulation defect, unspecified: Secondary | ICD-10-CM

## 2018-01-29 DIAGNOSIS — B351 Tinea unguium: Secondary | ICD-10-CM | POA: Diagnosis not present

## 2018-01-29 DIAGNOSIS — E1151 Type 2 diabetes mellitus with diabetic peripheral angiopathy without gangrene: Secondary | ICD-10-CM | POA: Diagnosis not present

## 2018-01-29 DIAGNOSIS — L608 Other nail disorders: Secondary | ICD-10-CM

## 2018-01-29 NOTE — Progress Notes (Signed)
Patient ID: ZEEK ROSTRON Sr., male   DOB: 02/13/1920, 84 y.o.   MRN: [id number] HPI  Complaint:  Visit Type: Patient returns to my office for continued preventative foot care services. Complaint: Patient states" my nails have grown long and thick and become painful to walk and wear shoes" Patient has been diagnosed with DM . This patient  presents for preventative foot care services. No changes to ROS  Podiatric Exam: Vascular: dorsalis pedis and posterior tibial pulses are non-palpable. Capillary return is diminished. Cold feet noted.. Skin turgor WNL, bilateral swelling  Sensorium: Diminished  Semmes Weinstein monofilament test. Normal tactile sensation bilaterally.  Nail Exam: Pt has thick disfigured discolored nails with subungual debris noted bilateral entire nail hallux through fifth toenails Ulcer Exam: There is no evidence of ulcer or pre-ulcerative changes or infection. Orthopedic Exam: Muscle tone and strength are WNL. No limitations in general ROM. No crepitus or effusions noted. Foot type and digits show no abnormalities. Bony prominences are unremarkable. Skin: No Porokeratosis. No infection or ulcers  Diagnosis:  Onychomycosis, Pain in right toe, pain in left toes  Treatment & Plan Procedures and Treatment: Consent by patient was obtained for treatment procedures. The patient understood the discussion of treatment and procedures well. All questions were answered thoroughly reviewed. Debridement of mycotic and hypertrophic toenails, 1 through 5 bilateral and clearing of subungual debris. No ulceration, no infection noted.  Return Visit-Office Procedure: Patient instructed to return to the office for a follow up visit 3 months for continued evaluation and treatment.    Gardiner Barefoot DPM

## 2018-04-30 ENCOUNTER — Ambulatory Visit: Payer: Medicare Other | Admitting: Podiatry

## 2018-04-30 ENCOUNTER — Encounter: Payer: Self-pay | Admitting: Podiatry

## 2018-04-30 DIAGNOSIS — E1151 Type 2 diabetes mellitus with diabetic peripheral angiopathy without gangrene: Secondary | ICD-10-CM | POA: Diagnosis not present

## 2018-04-30 DIAGNOSIS — M79676 Pain in unspecified toe(s): Secondary | ICD-10-CM

## 2018-04-30 DIAGNOSIS — B351 Tinea unguium: Secondary | ICD-10-CM | POA: Diagnosis not present

## 2018-04-30 DIAGNOSIS — L608 Other nail disorders: Secondary | ICD-10-CM

## 2018-04-30 DIAGNOSIS — D689 Coagulation defect, unspecified: Secondary | ICD-10-CM

## 2018-04-30 NOTE — Progress Notes (Signed)
Patient ID: Marvin NALL Sr., male   DOB: 1919/11/29, 84 y.o.   MRN: [id number] HPI  Complaint:  Visit Type: Patient returns to my office for continued preventative foot care services. Complaint: Patient states" my nails have grown long and thick and become painful to walk and wear shoes" Patient has been diagnosed with DM . This patient  presents for preventative foot care services. No changes to ROS.  Patient presents to the office with his daughter.  Podiatric Exam: Vascular: dorsalis pedis and posterior tibial pulses are non-palpable. Capillary return is diminished. Cold feet noted.. Skin turgor WNL, bilateral swelling  Sensorium: Diminished  Semmes Weinstein monofilament test. Normal tactile sensation bilaterally.  Nail Exam: Pt has thick disfigured discolored nails with subungual debris noted bilateral entire nail hallux through fifth toenails Ulcer Exam: There is no evidence of ulcer or pre-ulcerative changes or infection. Orthopedic Exam: Muscle tone and strength are WNL. No limitations in general ROM. No crepitus or effusions noted. Foot type and digits show no abnormalities. Bony prominences are unremarkable. Skin: No Porokeratosis. No infection or ulcers  Diagnosis:  Onychomycosis, Pain in right toe, pain in left toes  Treatment & Plan Procedures and Treatment: Consent by patient was obtained for treatment procedures. The patient understood the discussion of treatment and procedures well. All questions were answered thoroughly reviewed. Debridement of mycotic and hypertrophic toenails, 1 through 5 bilateral and clearing of subungual debris. No ulceration, no infection noted.  Return Visit-Office Procedure: Patient instructed to return to the office for a follow up visit 10 weeks  for continued evaluation and treatment.    Gardiner Barefoot DPM

## 2018-07-09 ENCOUNTER — Ambulatory Visit: Payer: Medicare Other | Admitting: Podiatry

## 2018-07-15 ENCOUNTER — Encounter: Payer: Self-pay | Admitting: Podiatry

## 2018-07-15 ENCOUNTER — Ambulatory Visit (INDEPENDENT_AMBULATORY_CARE_PROVIDER_SITE_OTHER): Payer: Medicare Other | Admitting: Podiatry

## 2018-07-15 DIAGNOSIS — E1151 Type 2 diabetes mellitus with diabetic peripheral angiopathy without gangrene: Secondary | ICD-10-CM

## 2018-07-15 DIAGNOSIS — D689 Coagulation defect, unspecified: Secondary | ICD-10-CM | POA: Diagnosis not present

## 2018-07-15 DIAGNOSIS — L608 Other nail disorders: Secondary | ICD-10-CM

## 2018-07-15 DIAGNOSIS — M79676 Pain in unspecified toe(s): Secondary | ICD-10-CM

## 2018-07-15 DIAGNOSIS — B351 Tinea unguium: Secondary | ICD-10-CM

## 2018-07-15 NOTE — Progress Notes (Signed)
Patient ID: Marvin KNERR Sr., male   DOB: 1919/12/31, 84 y.o.   MRN: [id number] HPI  Complaint:  Visit Type: Patient returns to my office for continued preventative foot care services. Complaint: Patient states" my nails have grown long and thick and become painful to walk and wear shoes" Patient has been diagnosed with DM . This patient  presents for preventative foot care services. No changes to ROS.  Patient presents to the office with his daughter. Patient is taking 3liquiss.  Podiatric Exam: Vascular: dorsalis pedis and posterior tibial pulses are non-palpable. Capillary return is diminished. Cold feet noted.. Skin turgor WNL, bilateral swelling  Sensorium: Diminished  Semmes Weinstein monofilament test. Normal tactile sensation bilaterally.  Nail Exam: Pt has thick disfigured discolored nails with subungual debris noted bilateral entire nail hallux through fifth toenails Ulcer Exam: There is no evidence of ulcer or pre-ulcerative changes or infection. Orthopedic Exam: Muscle tone and strength are WNL. No limitations in general ROM. No crepitus or effusions noted. Foot type and digits show no abnormalities. Bony prominences are unremarkable. Skin: No Porokeratosis. No infection or ulcers  Diagnosis:  Onychomycosis, Pain in right toe, pain in left toes  Treatment & Plan Procedures and Treatment: Consent by patient was obtained for treatment procedures. The patient understood the discussion of treatment and procedures well. All questions were answered thoroughly reviewed. Debridement of mycotic and hypertrophic toenails, 1 through 5 bilateral and clearing of subungual debris. No ulceration, no infection noted.  Return Visit-Office Procedure: Patient instructed to return to the office for a follow up visit 10 weeks  for continued evaluation and treatment.    Gardiner Barefoot DPM

## 2018-07-16 ENCOUNTER — Other Ambulatory Visit: Payer: Self-pay | Admitting: Internal Medicine

## 2018-07-16 ENCOUNTER — Ambulatory Visit
Admission: RE | Admit: 2018-07-16 | Discharge: 2018-07-16 | Disposition: A | Discharge status: Home / Self Care | Payer: Medicare Other | Source: Ambulatory Visit | Attending: Internal Medicine | Admitting: Internal Medicine

## 2018-07-16 DIAGNOSIS — R05 Cough: Secondary | ICD-10-CM

## 2018-07-16 DIAGNOSIS — R059 Cough, unspecified: Secondary | ICD-10-CM

## 2018-09-24 ENCOUNTER — Ambulatory Visit: Payer: Medicare Other | Admitting: Podiatry

## 2018-10-14 ENCOUNTER — Emergency Department (HOSPITAL_COMMUNITY)
Admission: EM | Admit: 2018-10-14 | Discharge: 2018-10-14 | Disposition: A | Discharge status: Home / Self Care | Payer: Medicare Other | Attending: Emergency Medicine | Admitting: Emergency Medicine

## 2018-10-14 ENCOUNTER — Encounter (HOSPITAL_COMMUNITY): Payer: Self-pay | Admitting: Emergency Medicine

## 2018-10-14 ENCOUNTER — Emergency Department (HOSPITAL_COMMUNITY): Payer: Medicare Other

## 2018-10-14 DIAGNOSIS — R05 Cough: Secondary | ICD-10-CM | POA: Diagnosis present

## 2018-10-14 DIAGNOSIS — E1140 Type 2 diabetes mellitus with diabetic neuropathy, unspecified: Secondary | ICD-10-CM | POA: Diagnosis not present

## 2018-10-14 DIAGNOSIS — I5032 Chronic diastolic (congestive) heart failure: Secondary | ICD-10-CM | POA: Diagnosis not present

## 2018-10-14 DIAGNOSIS — Z87891 Personal history of nicotine dependence: Secondary | ICD-10-CM | POA: Diagnosis not present

## 2018-10-14 DIAGNOSIS — I110 Hypertensive heart disease with heart failure: Secondary | ICD-10-CM | POA: Insufficient documentation

## 2018-10-14 DIAGNOSIS — J069 Acute upper respiratory infection, unspecified: Secondary | ICD-10-CM | POA: Diagnosis not present

## 2018-10-14 DIAGNOSIS — B9789 Other viral agents as the cause of diseases classified elsewhere: Secondary | ICD-10-CM

## 2018-10-14 DIAGNOSIS — J45909 Unspecified asthma, uncomplicated: Secondary | ICD-10-CM | POA: Insufficient documentation

## 2018-10-14 DIAGNOSIS — Z7901 Long term (current) use of anticoagulants: Secondary | ICD-10-CM | POA: Insufficient documentation

## 2018-10-14 DIAGNOSIS — R062 Wheezing: Secondary | ICD-10-CM | POA: Diagnosis not present

## 2018-10-14 DIAGNOSIS — Z79899 Other long term (current) drug therapy: Secondary | ICD-10-CM | POA: Insufficient documentation

## 2018-10-14 DIAGNOSIS — E7800 Pure hypercholesterolemia, unspecified: Secondary | ICD-10-CM | POA: Diagnosis not present

## 2018-10-14 DIAGNOSIS — F0390 Unspecified dementia without behavioral disturbance: Secondary | ICD-10-CM | POA: Insufficient documentation

## 2018-10-14 DIAGNOSIS — Z794 Long term (current) use of insulin: Secondary | ICD-10-CM | POA: Insufficient documentation

## 2018-10-14 LAB — I-STAT TROPONIN, ED: Troponin i, poc: 0 ng/mL (ref 0.00–0.08)

## 2018-10-14 LAB — CBC WITH DIFFERENTIAL/PLATELET
Abs Immature Granulocytes: 0.02 10*3/uL (ref 0.00–0.07)
BASOS ABS: 0.1 10*3/uL (ref 0.0–0.1)
BASOS PCT: 1 %
Eosinophils Absolute: 0.5 10*3/uL (ref 0.0–0.5)
Eosinophils Relative: 5 %
HEMATOCRIT: 36.4 % — AB (ref 39.0–52.0)
Hemoglobin: 12 g/dL — ABNORMAL LOW (ref 13.0–17.0)
IMMATURE GRANULOCYTES: 0 %
LYMPHS ABS: 3.4 10*3/uL (ref 0.7–4.0)
Lymphocytes Relative: 36 %
MCH: 27.5 pg (ref 26.0–34.0)
MCHC: 33 g/dL (ref 30.0–36.0)
MCV: 83.5 fL (ref 80.0–100.0)
MONOS PCT: 16 %
Monocytes Absolute: 1.5 10*3/uL — ABNORMAL HIGH (ref 0.1–1.0)
NEUTROS PCT: 42 %
Neutro Abs: 4 10*3/uL (ref 1.7–7.7)
PLATELETS: 207 10*3/uL (ref 150–400)
RBC: 4.36 MIL/uL (ref 4.22–5.81)
RDW: 14.9 % (ref 11.5–15.5)
WBC: 9.5 10*3/uL (ref 4.0–10.5)
nRBC: 0 % (ref 0.0–0.2)

## 2018-10-14 LAB — BASIC METABOLIC PANEL
ANION GAP: 10 (ref 5–15)
BUN: 27 mg/dL — ABNORMAL HIGH (ref 8–23)
CALCIUM: 8.9 mg/dL (ref 8.9–10.3)
CO2: 25 mmol/L (ref 22–32)
Chloride: 105 mmol/L (ref 98–111)
Creatinine, Ser: 1.93 mg/dL — ABNORMAL HIGH (ref 0.61–1.24)
GFR, EST AFRICAN AMERICAN: 33 mL/min — AB (ref >60)
GFR, EST NON AFRICAN AMERICAN: 29 mL/min — AB (ref >60)
GLUCOSE: 115 mg/dL — AB (ref 70–99)
POTASSIUM: 3.7 mmol/L (ref 3.5–5.1)
Sodium: 140 mmol/L (ref 135–145)

## 2018-10-14 LAB — BRAIN NATRIURETIC PEPTIDE: B Natriuretic Peptide: 23.2 pg/mL (ref 0.0–100.0)

## 2018-10-14 MED ORDER — ALBUTEROL SULFATE HFA 108 (90 BASE) MCG/ACT IN AERS
2.0000 | INHALATION_SPRAY | Freq: Once | RESPIRATORY_TRACT | Status: AC
  Administered 2018-10-14: 2 via RESPIRATORY_TRACT
  Filled 2018-10-14: qty 6.7

## 2018-10-14 MED ORDER — PREDNISONE 20 MG PO TABS: 40.0000 mg | ORAL_TABLET | Freq: Every day | ORAL | 0 refills | Status: DC

## 2018-10-14 NOTE — ED Triage Notes (Signed)
Pt BIB [facility] for cough. Cough has been for 48 hours. Per report called PCP and stated if it gets worse be evaluated in ED. Pt has received 10 albuterol, 0.5 atrovent, 125mg  solumedrol. Per report patient had wheezing throughout but did not complain of [facility]

## 2018-10-14 NOTE — ED Provider Notes (Signed)
TIME SEEN: 2:55 AM  CHIEF COMPLAINT: Cough  HPI: Patient is a 84 year old male with history of dementia, hypertension, hyperlipidemia, diabetes, asthma as a child, grade 1 diastolic dysfunction who presents to the emergency department with EMS for cough for the past couple of days.  History is provided by EMS.  They state that he was wheezing on examination but not hypoxic.  He was given 10 mg of albuterol, 0.5 mg of Atrovent and 125 mg of IV Solu-Medrol.  Patient denies any chest pain or any pain at all at this time.  He denies feeling short of breath.  EMS reports patient recently treated for lice.  ROS: Level 5 caveat for dementia  PAST MEDICAL HISTORY/PAST SURGICAL HISTORY:  Past Medical History:  Diagnosis Date  . Arthritis   . Asthma    hx of as child  . Complete loss of vision    right eye  . Dementia ([place])   . Diabetes mellitus   . Headache(784.0)    slight  . Hypercholesteremia   . Hypertension   . Insomnia    occasional  . Memory loss   . Nephrolithiasis    right UVJ stone  . Peripheral neuropathy    both legs  . PONV (postoperative nausea and vomiting)   . Prostate cancer [facility])     MEDICATIONS:  Prior to Admission medications   Medication Sig Start Date End Date Taking? Authorizing Provider  acetaminophen (TYLENOL) 325 MG tablet Take 2 tablets (650 mg total) by mouth every 4 (four) hours as needed for mild pain (or temp > 37.5 C (99.5 F)). 05/31/17   Raiford Noble Latif, DO  apixaban (ELIQUIS) 5 MG TABS tablet Take 1 tablet (5 mg total) by mouth 2 (two) times daily. 05/31/17   Sheikh, Georgina Quint Latif, DO  BD PEN NEEDLE NANO U/F 32G X 4 MM MISC See admin instructions. 07/13/15   [provider]  Carboxymethylcellul-Glycerin 0.5-0.9 % SOLN Apply 1 drop to eye daily after breakfast.     [provider]  cefpodoxime (VANTIN) 200 MG tablet Take 1 tablet (200 mg total) by mouth every 12 (twelve) hours. 05/31/17   Raiford Noble Latif, DO  docusate sodium 100 MG  CAPS Take 200 mg by mouth 2 (two) times daily as needed for mild constipation. Patient taking differently: Take 200 mg by mouth 3 (three) times a week.  08/25/14   Thurnell Lose, MD  erythromycin ophthalmic ointment Place 1 application into the right eye at bedtime. 05/03/17   [provider]  esomeprazole (NEXIUM) 40 MG capsule Take 1 capsule (40 mg total) by mouth 2 (two) times daily before a meal. 04/04/15   Barton Dubois, MD  famotidine (PEPCID) 20 MG tablet Take 1 tablet (20 mg total) by mouth at bedtime. 04/04/15   Barton Dubois, MD  furosemide (LASIX) 40 MG tablet Take 40 mg by mouth daily after breakfast.     [provider]  gabapentin (NEURONTIN) 100 MG capsule TAKE 2 CAPSULES BY MOUTH EVERY DAY AT BEDTIME 09/21/15   [provider]  glipiZIDE (GLUCOTROL XL) 10 MG 24 hr tablet Take 10 mg by mouth daily with breakfast.     [provider]  HUMALOG KWIKPEN 100 UNIT/ML KiwkPen Inject 2-7 Units into the skin 2 (two) times daily with a meal. 2 units if sugar level 150-200 and 7 units when above 200 06/08/16   [provider]  LEVEMIR FLEXTOUCH 100 UNIT/ML Pen 10 UNIT TWICE A DAY SUBCUTANEOUS 04/08/18  [provider]  Loratadine (CLARITIN) 10 MG CAPS Take 10 mg by mouth daily after breakfast.     [provider]  memantine (NAMENDA) 10 MG tablet Take 1 tablet (10 mg total) by mouth 2 (two) times daily. Please call [phone] to schedule yearly appt. 04/16/17   Marcial Pacas, MD  metoprolol tartrate (LOPRESSOR) 25 MG tablet Take 12.5 mg by mouth 2 (two) times daily.  06/23/15   [provider]  Multiple Vitamins-Minerals (ELDERTONIC PO) Take 15 mLs by mouth 2 (two) times daily.    [provider]  ONE TOUCH ULTRA TEST test strip USE TO TEST TWICE A DAY 25 07/03/18   [provider]  polyethylene glycol (MIRALAX / GLYCOLAX) packet Take 17 g by mouth daily as needed for mild constipation. Takes on [facility] 04/29/15   [provider]  senna-docusate (SENOKOT-S) 8.6-50 MG tablet Take 1 tablet by mouth at bedtime as needed for moderate constipation. 05/31/17   Raiford Noble Latif, DO  simvastatin (ZOCOR) 40 MG tablet Take 40 mg by mouth every morning.     [provider]    ALLERGIES:  Allergies  Allergen Reactions  . Donepezil Nausea And Vomiting  . Hydrocodone Nausea And Vomiting  . Hydrocodone-Acetaminophen Nausea Only    SOCIAL HISTORY:  Social History   Tobacco Use  . Smoking status: Former Smoker    Packs/day: 0.25    Types: Cigarettes    Last attempt to quit: 10/15/1966    Years since quitting: 52.0  . Smokeless tobacco: Never Used  Substance Use Topics  . Alcohol use: Yes    Comment: occasional    FAMILY HISTORY: Family History  Problem Relation Age of Onset  . Cancer Mother        uterus  . Heart Problems Father   . Cancer Brother        stomach  . Cancer Brother        throat  . Colon cancer Brother     EXAM: BP (!) 118/57   Pulse 86   Temp 98.3 F (36.8 C) (Oral)   SpO2 99% RR 20 CONSTITUTIONAL: Alert and oriented to person and responds appropriately to questions intermittently. Well-appearing; well-nourished, elderly, in no distress HEAD: Normocephalic EYES: Conjunctivae clear, pupils appear equal, EOMI ENT: normal nose; moist mucous membranes NECK: Supple, no meningismus, no nuchal rigidity, no LAD  CARD: RRR; S1 and S2 appreciated; no murmurs, no clicks, no rubs, no gallops RESP: Normal chest excursion without splinting or tachypnea; breath sounds clear and equal bilaterally; no wheezes, no rhonchi, no rales, no hypoxia or respiratory distress, speaking full sentences ABD/GI: Normal bowel sounds; non-distended; soft, non-tender, no rebound, no guarding, no peritoneal signs, no hepatosplenomegaly BACK:  The back appears normal and is non-tender to palpation, there is no CVA tenderness EXT: Normal ROM in all joints; non-tender to palpation; no edema; normal  capillary refill; no cyanosis, no calf tenderness or swelling    SKIN: Normal color for age and race; warm; no rash NEURO: Moves all extremities equally PSYCH: The patient's mood and manner are appropriate. Grooming and personal hygiene are appropriate.  MEDICAL DECISION MAKING: Patient here with cough.  Was initially wheezing with EMS but now lungs are completely clear.  Does not appear volume overloaded.  He denies chest pain or shortness of breath.  Not hypoxic here.  Will obtain cardiac labs given there is a history of grade 1 diastolic dysfunction and patient is not a reliable historian given his  dementia.  Will obtain chest x-ray to evaluate for possible pneumonia, pulmonary edema.  Doubt PE, dissection.  ED PROGRESS: Patient's labs are unremarkable.  Troponin negative.  BNP normal.  He has chronic kidney disease with stable creatinine.  Chest x-ray shows minimal left basilar opacity reflecting atelectasis but otherwise unremarkable.  At this time I do not feel he needs antibiotics and have discussed this with his daughter.  Suspect bronchospasm from possible viral illness.  Will discharge with albuterol inhaler and brief steroid burst.  Daughter comfortable with this plan.  They have a PCP with [facility] physicians for follow-up.  We have discussed risk and benefits of steroids and strict return precautions.  Daughter comfortable with this plan.  Lungs are clear still and he is not hypoxic.   At this time, I do not feel there is any life-threatening condition present. I have reviewed and discussed all results (EKG, imaging, lab, urine as appropriate) and exam findings with patient/family. I have reviewed nursing notes and appropriate previous records.  I feel the patient is safe to be discharged home without further emergent workup and can continue workup as an outpatient as needed. Discussed usual and customary return precautions. Patient/family verbalize understanding and are comfortable with this  plan.  Outpatient follow-up has been provided as needed. All questions have been answered.     EKG Interpretation  Date/Time:  Tuesday October 14 2018 04:25:10 EST Ventricular Rate:  94 PR Interval:    QRS Duration: 106 QT Interval:  391 QTC Calculation: 489 R Axis:   -8 Text Interpretation:  Sinus rhythm Abnormal R-wave progression, early transition Borderline prolonged QT interval No significant change since last tracing Confirmed by Velita Quirk, Cyril Mourning [phone]) on 10/14/2018 4:29:50 AM         Gladyes Kudo, Delice Bison, DO 10/14/18 0430

## 2018-10-14 NOTE — Discharge Instructions (Signed)
You may use albuterol inhaler 2 to 4 puffs every 2-4 hours as needed for shortness of breath, wheezing.  You may use over-the-counter guaifenesin (Mucinex) as needed for cough.  Labs today showed no acute changes.  No sign of fluid on his lungs or pneumonia.  He does not need antibiotics at this time.  If symptoms are not improving, please follow-up with your primary care physician.

## 2018-10-20 ENCOUNTER — Encounter (HOSPITAL_COMMUNITY): Payer: Self-pay | Admitting: Emergency Medicine

## 2018-10-20 ENCOUNTER — Emergency Department (HOSPITAL_COMMUNITY): Payer: Medicare Other

## 2018-10-20 ENCOUNTER — Emergency Department (HOSPITAL_COMMUNITY)
Admission: EM | Admit: 2018-10-20 | Discharge: 2018-10-20 | Disposition: A | Discharge status: Home / Self Care | Payer: Medicare Other | Attending: Emergency Medicine | Admitting: Emergency Medicine

## 2018-10-20 DIAGNOSIS — N183 Chronic kidney disease, stage 3 (moderate): Secondary | ICD-10-CM | POA: Insufficient documentation

## 2018-10-20 DIAGNOSIS — J40 Bronchitis, not specified as acute or chronic: Secondary | ICD-10-CM | POA: Insufficient documentation

## 2018-10-20 DIAGNOSIS — I5042 Chronic combined systolic (congestive) and diastolic (congestive) heart failure: Secondary | ICD-10-CM | POA: Diagnosis not present

## 2018-10-20 DIAGNOSIS — Z86711 Personal history of pulmonary embolism: Secondary | ICD-10-CM | POA: Diagnosis not present

## 2018-10-20 DIAGNOSIS — E1122 Type 2 diabetes mellitus with diabetic chronic kidney disease: Secondary | ICD-10-CM | POA: Insufficient documentation

## 2018-10-20 DIAGNOSIS — Z86718 Personal history of other venous thrombosis and embolism: Secondary | ICD-10-CM | POA: Diagnosis not present

## 2018-10-20 DIAGNOSIS — E119 Type 2 diabetes mellitus without complications: Secondary | ICD-10-CM | POA: Diagnosis not present

## 2018-10-20 DIAGNOSIS — I130 Hypertensive heart and chronic kidney disease with heart failure and stage 1 through stage 4 chronic kidney disease, or unspecified chronic kidney disease: Secondary | ICD-10-CM | POA: Insufficient documentation

## 2018-10-20 DIAGNOSIS — R0602 Shortness of breath: Secondary | ICD-10-CM | POA: Diagnosis present

## 2018-10-20 LAB — CBC WITH DIFFERENTIAL/PLATELET
Abs Immature Granulocytes: 0.03 10*3/uL (ref 0.00–0.07)
BASOS ABS: 0 10*3/uL (ref 0.0–0.1)
BASOS PCT: 0 %
Eosinophils Absolute: 0.2 10*3/uL (ref 0.0–0.5)
Eosinophils Relative: 2 %
HCT: 42.5 % (ref 39.0–52.0)
Hemoglobin: 13.3 g/dL (ref 13.0–17.0)
Immature Granulocytes: 0 %
Lymphocytes Relative: 30 %
Lymphs Abs: 3 10*3/uL (ref 0.7–4.0)
MCH: 26.3 pg (ref 26.0–34.0)
MCHC: 31.3 g/dL (ref 30.0–36.0)
MCV: 84 fL (ref 80.0–100.0)
MONO ABS: 0.9 10*3/uL (ref 0.1–1.0)
Monocytes Relative: 9 %
NEUTROS ABS: 5.7 10*3/uL (ref 1.7–7.7)
NRBC: 0 % (ref 0.0–0.2)
Neutrophils Relative %: 59 %
PLATELETS: 245 10*3/uL (ref 150–400)
RBC: 5.06 MIL/uL (ref 4.22–5.81)
RDW: 15 % (ref 11.5–15.5)
WBC: 9.9 10*3/uL (ref 4.0–10.5)

## 2018-10-20 LAB — COMPREHENSIVE METABOLIC PANEL
ALBUMIN: 3.2 g/dL — AB (ref 3.5–5.0)
ALT: 28 U/L (ref 0–44)
ANION GAP: 9 (ref 5–15)
AST: 28 U/L (ref 15–41)
Alkaline Phosphatase: 73 U/L (ref 38–126)
BUN: 30 mg/dL — ABNORMAL HIGH (ref 8–23)
CHLORIDE: 101 mmol/L (ref 98–111)
CO2: 28 mmol/L (ref 22–32)
Calcium: 9.1 mg/dL (ref 8.9–10.3)
Creatinine, Ser: 1.63 mg/dL — ABNORMAL HIGH (ref 0.61–1.24)
GFR calc non Af Amer: 35 mL/min — ABNORMAL LOW (ref >60)
GFR, EST AFRICAN AMERICAN: 41 mL/min — AB (ref >60)
Glucose, Bld: 121 mg/dL — ABNORMAL HIGH (ref 70–99)
Potassium: 3.2 mmol/L — ABNORMAL LOW (ref 3.5–5.1)
SODIUM: 138 mmol/L (ref 135–145)
Total Bilirubin: 1.1 mg/dL (ref 0.3–1.2)
Total Protein: 7.2 g/dL (ref 6.5–8.1)

## 2018-10-20 MED ORDER — AEROCHAMBER PLUS FLO-VU LARGE MISC
Status: AC
  Filled 2018-10-20: qty 1

## 2018-10-20 MED ORDER — PREDNISONE 20 MG PO TABS
60.0000 mg | ORAL_TABLET | Freq: Once | ORAL | Status: AC
Start: 2018-10-20 — End: 2018-10-20
  Administered 2018-10-20: 60 mg via ORAL
  Filled 2018-10-20: qty 3

## 2018-10-20 MED ORDER — AZITHROMYCIN 250 MG PO TABS: 250.0000 mg | ORAL_TABLET | Freq: Every day | ORAL | 0 refills | Status: DC

## 2018-10-20 MED ORDER — AZITHROMYCIN 250 MG PO TABS
500.0000 mg | ORAL_TABLET | Freq: Once | ORAL | Status: AC
  Administered 2018-10-20: 500 mg via ORAL
  Filled 2018-10-20: qty 2

## 2018-10-20 MED ORDER — ALBUTEROL SULFATE HFA 108 (90 BASE) MCG/ACT IN AERS
2.0000 | INHALATION_SPRAY | Freq: Once | RESPIRATORY_TRACT | Status: AC
  Administered 2018-10-20: 2 via RESPIRATORY_TRACT
  Filled 2018-10-20: qty 6.7

## 2018-10-20 MED ORDER — AEROCHAMBER PLUS FLO-VU MISC
1.0000 | Freq: Once | Status: AC
  Administered 2018-10-20: 1
  Filled 2018-10-20: qty 1

## 2018-10-20 MED ORDER — PREDNISONE 20 MG PO TABS: ORAL_TABLET | ORAL | 0 refills | Status: DC

## 2018-10-20 NOTE — ED Provider Notes (Signed)
Emergency Department Provider Note   I have reviewed the triage vital signs and the nursing notes.   HISTORY  Chief Complaint Shortness of Breath   HPI Marvin Bishop. is a 85 y.o. male who presents the emergency department today with his home health aide who states that he was breathing heavy earlier so she called EMS.  EMS gave some breathing treatments and home health aide states that she feels like his breathing normal now.  Patient has no complaints.  He denies chest pain, shortness of breath, cough, sputum production, lower extremity swelling or any other associated symptoms. No other associated or modifying symptoms.    Past Medical History:  Diagnosis Date  . Arthritis   . Asthma    hx of as child  . Complete loss of vision    right eye  . Dementia ([place])   . Diabetes mellitus   . Headache(784.0)    slight  . Hypercholesteremia   . Hypertension   . Insomnia    occasional  . Memory loss   . Nephrolithiasis    right UVJ stone  . Peripheral neuropathy    both legs  . PONV (postoperative nausea and vomiting)   . Prostate cancer [facility])     Patient Active Problem List   Diagnosis Date Noted  . Hypophosphatemia 05/31/2017  . TIA (transient ischemic attack) 05/27/2017  . Sepsis ([place]) 05/27/2017  . CKD (chronic kidney disease), stage III ([place]) 05/27/2017  . Influenza 11/08/2016  . Chronic combined systolic and diastolic congestive heart failure ([place]) 11/08/2016  . History of pulmonary embolus (PE) 11/08/2016  . PE (pulmonary embolism) 04/07/2015  . Pulmonary embolism ([place]) 04/07/2015  . Elevated troponin 04/07/2015  . Cough   . Pulmonary emboli ([place])   . Sinus tachycardia 04/01/2015  . Acid reflux 03/07/2015  . Blind painful eye 01/07/2015  . Ileus ([place]) 08/23/2014  . Dementia without behavioral disturbance ([place]) 09/02/2013  . Renal calculus or stone 08/22/2013  . Hypokalemia 08/22/2013  . Hypertension 08/22/2013  . IDDM (insulin dependent diabetes  mellitus) ([place]) 08/22/2013  . Dehydration 08/22/2013  . Memory loss 04/23/2013  . Insomnia   . Carpal tunnel syndrome   . Diverticulosis     Past Surgical History:  Procedure Laterality Date  . APPENDECTOMY    . Cornea removed Right 02-26-15  . CYSTOSCOPY W/ URETERAL STENT PLACEMENT Right 10/01/2013   Procedure: cystoscopy right retrograde holmium laser lithotripsy;  Surgeon: Franchot Gallo, MD;  Location: [facility];  Service: Urology;  Laterality: Right;  holmium laser application  . HEMORROIDECTOMY    . KNEE ARTHROSCOPY Right   . PARS PLANA VITRECTOMY  10/24/2011   Procedure: PARS PLANA VITRECTOMY WITH 23 GAUGE;  Surgeon: Adonis Brook, MD;  Location: [place];  Service: Ophthalmology;  Laterality: Right;  . TONSILLECTOMY  as teen    Current Outpatient Rx  . Order #: [id number] Class: Normal  . Order #: [id number] Class: Normal  . Order #: [id number] Class: Print  . Order #: [id number] Class: Historical Med  . Order #: [id number] Class: Historical Med  . Order #: [id number] Class: Normal  . Order #: [id number] Class: Print  . Order #: [id number] Class: Historical Med  . Order #: [id number] Class: Print  . Order #: [id number] Class: Print  . Order #: [id number] Class: Historical Med  . Order #: [id number] Class: Historical Med  . Order #: [id number] Class: Historical Med  . Order #: [id number] Class: Historical Med  . Order #: [id number] Class: Historical Med  . Order #: [id number] Class: Historical Med  . Order #:  [id number] Class: Normal  . Order #: [id number] Class: Historical Med  . Order #: [id number] Class: Historical Med  . Order #: [id number] Class: Historical Med  . Order #: [id number] Class: Historical Med  . Order #: [id number] Class: Print  . Order #: [id number] Class: Normal  . Order #: [id number] Class: Historical Med    Allergies Donepezil; Hydrocodone; and Hydrocodone-acetaminophen  Family History  Problem Relation Age of Onset  . Cancer Mother        uterus  . Heart Problems Father   . Cancer  Brother        stomach  . Cancer Brother        throat  . Colon cancer Brother     Social History Social History   Tobacco Use  . Smoking status: Former Smoker    Packs/day: 0.25    Types: Cigarettes    Last attempt to quit: 10/15/1966    Years since quitting: 52.0  . Smokeless tobacco: Never Used  Substance Use Topics  . Alcohol use: Yes    Comment: occasional  . Drug use: No    Review of Systems  All other systems negative except as documented in the HPI. All pertinent positives and negatives as reviewed in the HPI. ____________________________________________   PHYSICAL EXAM:  VITAL SIGNS: ED Triage Vitals  Enc Vitals Group     BP 10/20/18 1320 139/71     Pulse Rate 10/20/18 1320 (!) 101     Resp 10/20/18 1320 13     Temp 10/20/18 1320 98.4 F (36.9 C)     Temp Source 10/20/18 1320 Oral     SpO2 10/20/18 1320 99 %     Weight 10/20/18 1318 196 lb 3.2 oz (89 kg)     Height 10/20/18 1317 6' 1.5" (1.867 m)     Head Circumference --      Peak Flow --      Pain Score 10/20/18 1317 0     Pain Loc --      Pain Edu? --      Excl. in [place]? --     Constitutional: Alert and oriented. Well appearing and in no acute distress. Eyes: Conjunctivae are normal. PERRL. EOMI. Head: Atraumatic. Nose: No congestion/rhinnorhea. Mouth/Throat: Mucous membranes are moist.  Oropharynx non-erythematous. Neck: No stridor.  No meningeal signs.   Cardiovascular: Normal rate, regular rhythm. Good peripheral circulation. Grossly normal heart sounds.   Respiratory: Normal respiratory effort.  No retractions. Lungs CTAB. Gastrointestinal: Soft and nontender. No distention.  Musculoskeletal: No lower extremity tenderness nor edema. No gross deformities of extremities. Neurologic:  Normal speech and language. No gross focal neurologic deficits are appreciated.  Skin:  Skin is warm, dry and intact. No rash noted.   ____________________________________________   LABS (all labs ordered are  listed, but only abnormal results are displayed)  Labs Reviewed  COMPREHENSIVE METABOLIC PANEL - Abnormal; Notable for the following components:      Result Value   Potassium 3.2 (*)    Glucose, Bld 121 (*)    BUN 30 (*)    Creatinine, Ser 1.63 (*)    Albumin 3.2 (*)    GFR calc non Af Amer 35 (*)    GFR calc Af Amer 41 (*)    All other components within normal limits  CBC WITH DIFFERENTIAL/PLATELET   ____________________________________________  EKG   EKG Interpretation  Date/Time:  Monday October 20 2018 13:19:15 EST Ventricular Rate:  99 PR Interval:    QRS Duration: 97 QT Interval:  366 QTC Calculation:  470 R Axis:   -4 Text Interpretation:  Sinus rhythm Abnormal R-wave progression, early transition Baseline wander in lead(s) V3 Confirmed by Merrily Pew [id number]) on 10/21/2018 10:00:38 AM       ____________________________________________  RADIOLOGY  Dg Chest Portable 1 View  Result Date: 10/20/2018 CLINICAL DATA:  85 year old male with shortness of breath. Initial encounter. EXAM: PORTABLE CHEST 1 VIEW COMPARISON:  10/14/2018. FINDINGS: Chronic lung changes. No infiltrate, congestive heart failure or pneumothorax. No plain film evidence of pulmonary malignancy. Heart size within normal limits. Calcified mildly tortuous aorta. Bilateral shoulder joint degenerative changes. IMPRESSION: 1. Chronic lung changes.  No infiltrate or congestive heart failure. 2.  Aortic Atherosclerosis (ICD10-I70.0). Electronically Signed   By: Genia Del M.D.   On: 10/20/2018 14:10    ____________________________________________   PROCEDURES  Procedure(s) performed:   Procedures   ____________________________________________   INITIAL IMPRESSION / ASSESSMENT AND PLAN / ED COURSE  Patient with likely bronchitis.  With his history we will go and start antibiotics as well.  With his normal oxygenation no respiratory distress and no complaints and feel he is stable for discharge  home.     Pertinent labs & imaging results that were available during my care of the patient were reviewed by me and considered in my medical decision making (see chart for details).  ____________________________________________  FINAL CLINICAL IMPRESSION(S) / ED DIAGNOSES  Final diagnoses:  Bronchitis     MEDICATIONS GIVEN DURING THIS VISIT:  Medications  albuterol (PROVENTIL HFA;VENTOLIN HFA) 108 (90 Base) MCG/ACT inhaler 2 puff (2 puffs Inhalation Given 10/20/18 1541)  aerochamber plus with mask device 1 each (1 each Other Given 10/20/18 1541)  predniSONE (DELTASONE) tablet 60 mg (60 mg Oral Given 10/20/18 1434)  azithromycin (ZITHROMAX) tablet 500 mg (500 mg Oral Given 10/20/18 1434)     NEW OUTPATIENT MEDICATIONS STARTED DURING THIS VISIT:  Discharge Medication List as of 10/20/2018  3:30 PM    START taking these medications   Details  azithromycin (ZITHROMAX) 250 MG tablet Take 1 tablet (250 mg total) by mouth daily. Take 1 every day until finished., Starting Mon 10/20/2018, Print        Note:  This note was prepared with assistance of Dragon voice recognition software. Occasional wrong-word or sound-a-like substitutions may have occurred due to the inherent limitations of voice recognition software.   Merrily Pew, MD 10/21/18 1002

## 2018-10-20 NOTE — ED Triage Notes (Signed)
Pt arrives via EMS from home with SOB and cough for 2 weeks. EMS reports some wheezing and 5 mg albuterol given.

## 2018-10-20 NOTE — ED Notes (Signed)
Discharge paperwork reviewed with family and pt.

## 2018-11-05 ENCOUNTER — Other Ambulatory Visit: Payer: Self-pay | Admitting: Internal Medicine

## 2018-11-05 ENCOUNTER — Other Ambulatory Visit: Payer: Self-pay | Admitting: Critical Care Medicine

## 2018-11-05 ENCOUNTER — Ambulatory Visit: Payer: Medicare Other | Admitting: Podiatry

## 2018-11-05 ENCOUNTER — Ambulatory Visit
Admission: RE | Admit: 2018-11-05 | Discharge: 2018-11-05 | Disposition: A | Discharge status: Home / Self Care | Payer: Medicare Other | Source: Ambulatory Visit | Attending: Internal Medicine | Admitting: Internal Medicine

## 2018-11-05 DIAGNOSIS — R05 Cough: Secondary | ICD-10-CM

## 2018-11-05 DIAGNOSIS — R059 Cough, unspecified: Secondary | ICD-10-CM

## 2018-11-07 ENCOUNTER — Ambulatory Visit: Payer: Medicare Other | Admitting: Podiatry

## 2018-11-07 ENCOUNTER — Encounter: Payer: Self-pay | Admitting: Podiatry

## 2018-11-07 DIAGNOSIS — D689 Coagulation defect, unspecified: Secondary | ICD-10-CM

## 2018-11-07 DIAGNOSIS — B351 Tinea unguium: Secondary | ICD-10-CM

## 2018-11-07 DIAGNOSIS — L608 Other nail disorders: Secondary | ICD-10-CM | POA: Diagnosis not present

## 2018-11-07 DIAGNOSIS — E1151 Type 2 diabetes mellitus with diabetic peripheral angiopathy without gangrene: Secondary | ICD-10-CM

## 2018-11-07 DIAGNOSIS — M79676 Pain in unspecified toe(s): Secondary | ICD-10-CM

## 2018-11-07 NOTE — Progress Notes (Signed)
Patient ID: Marvin THOMANN Sr., male   DOB: [birth year], 85 y.o.   MRN: [id number] HPI  Complaint:  Visit Type: Patient returns to my office for continued preventative foot care services. Complaint: Patient states" my nails have grown long and thick and become painful to walk and wear shoes" Patient has been diagnosed with DM . This patient  presents for preventative foot care services. No changes to ROS.  Patient presents to the office with his daughter. Patient is taking 3liquiss.  Podiatric Exam: Vascular: dorsalis pedis and posterior tibial pulses are non-palpable. Capillary return is diminished. Cold feet noted.. Skin turgor WNL, bilateral swelling  Sensorium: Diminished  Semmes Weinstein monofilament test. Normal tactile sensation bilaterally.  Nail Exam: Pt has thick disfigured discolored nails with subungual debris noted bilateral entire nail hallux through fifth toenails.  Blackened skin at distal nail due to pincer nails. Ulcer Exam: There is no evidence of ulcer or pre-ulcerative changes or infection. Orthopedic Exam: Muscle tone and strength are WNL. No limitations in general ROM. No crepitus or effusions noted. Foot type and digits show no abnormalities. Bony prominences are unremarkable. Skin: No Porokeratosis. No infection or ulcers  Diagnosis:  Onychomycosis, Pain in right toe, pain in left toes  Treatment & Plan Procedures and Treatment: Consent by patient was obtained for treatment procedures. The patient understood the discussion of treatment and procedures well. All questions were answered thoroughly reviewed. Debridement of mycotic and hypertrophic toenails, 1 through 5 bilateral and clearing of subungual debris. No ulceration, no infection noted. .  Patient had blackened skin at distal nail bed which is painful.  He missed his 10 week appointment and now returns at 15 weeks.  Stressed returning in 10 weeks. Return Visit-Office Procedure: Patient instructed to return to the  office for a follow up visit 10 weeks  for continued evaluation and treatment.    Gardiner Barefoot DPM

## 2019-01-21 ENCOUNTER — Ambulatory Visit: Payer: Medicare Other | Admitting: Podiatry

## 2019-02-20 ENCOUNTER — Ambulatory Visit: Payer: Medicare Other | Admitting: Podiatry

## 2019-02-20 ENCOUNTER — Other Ambulatory Visit: Payer: Self-pay

## 2019-02-20 ENCOUNTER — Encounter: Payer: Self-pay | Admitting: Podiatry

## 2019-02-20 VITALS — Temp 97.9°F

## 2019-02-20 DIAGNOSIS — M79676 Pain in unspecified toe(s): Secondary | ICD-10-CM

## 2019-02-20 DIAGNOSIS — B351 Tinea unguium: Secondary | ICD-10-CM | POA: Diagnosis not present

## 2019-02-20 NOTE — Progress Notes (Signed)
Subjective:   Patient ID: Marvin Salmons., male   DOB: 85 y.o.   MRN: [id number]   HPI Patient presents stating he is got some dark skin on his right big toe it might be trauma and he has thick brittle nailbeds that they cannot cut that become painful bilateral   ROS      Objective:  Physical Exam  Neurovascular unchanged with thick yellow brittle nailbeds 1-5 of both feet that are painful and noted to have discoloration right big toe localized and it appears to be traumatic     Assessment:  Mycotic nail infection with pain 1-5 both feet with traumatized right hallux     Plan:  Debride painful nailbeds 1-5 both feet with no iatrogenic bleeding and reappoint to recheck

## 2019-03-11 ENCOUNTER — Ambulatory Visit: Payer: Medicare Other | Admitting: Podiatry

## 2019-04-29 ENCOUNTER — Encounter: Payer: Self-pay | Admitting: Podiatry

## 2019-04-29 ENCOUNTER — Other Ambulatory Visit: Payer: Self-pay

## 2019-04-29 ENCOUNTER — Ambulatory Visit: Payer: Medicare Other | Admitting: Podiatry

## 2019-04-29 VITALS — Temp 98.3°F

## 2019-04-29 DIAGNOSIS — D689 Coagulation defect, unspecified: Secondary | ICD-10-CM | POA: Diagnosis not present

## 2019-04-29 DIAGNOSIS — M79676 Pain in unspecified toe(s): Secondary | ICD-10-CM | POA: Diagnosis not present

## 2019-04-29 DIAGNOSIS — E1151 Type 2 diabetes mellitus with diabetic peripheral angiopathy without gangrene: Secondary | ICD-10-CM | POA: Diagnosis not present

## 2019-04-29 DIAGNOSIS — L84 Corns and callosities: Secondary | ICD-10-CM | POA: Insufficient documentation

## 2019-04-29 DIAGNOSIS — B351 Tinea unguium: Secondary | ICD-10-CM | POA: Diagnosis not present

## 2019-04-29 NOTE — Progress Notes (Signed)
Patient ID: Marvin LUMPKIN Sr., male   DOB: 02/01/1920, 85 y.o.   MRN: [id number] HPI  Complaint:  Visit Type: Patient returns to my office for continued preventative foot care services. Complaint: Patient states" my nails have grown long and thick and become painful to walk and wear shoes" Patient has been diagnosed with DM . This patient  presents for preventative foot care services. No changes to ROS.  Patient presents to the office with his daughter. Patient is taking Eliquiss.  Podiatric Exam: Vascular: dorsalis pedis and posterior tibial pulses are non-palpable. Capillary return is diminished. Cold feet noted.. Skin turgor WNL, bilateral swelling  Sensorium: Diminished  Semmes Weinstein monofilament test. Normal tactile sensation bilaterally.  Nail Exam: Pt has thick disfigured discolored nails with subungual debris noted bilateral entire nail hallux through fifth toenails.  Blackened skin at distal nail due to pincer nails. Ulcer Exam: There is no evidence of ulcer or pre-ulcerative changes or infection. Orthopedic Exam: Muscle tone and strength are WNL. No limitations in general ROM. No crepitus or effusions noted. Foot type and digits show no abnormalities. Bony prominences are unremarkable. Skin: No Porokeratosis. No infection or ulcers  Diagnosis:  Onychomycosis, Pain in right toe, pain in left toes  Treatment & Plan Procedures and Treatment: Consent by patient was obtained for treatment procedures. The patient understood the discussion of treatment and procedures well. All questions were answered thoroughly reviewed. Debridement of mycotic and hypertrophic toenails, 1 through 5 bilateral and clearing of subungual debris. No ulceration, no infection noted. .  Patient had blackened skin at distal nail bed which is not  painful.  Used dremel to debride blackened area distal aspect right hallux. Return Visit-Office Procedure: Patient instructed to return to the office for a follow up  visit 10 weeks  for continued evaluation and treatment.    Gardiner Barefoot DPM

## 2019-07-15 ENCOUNTER — Other Ambulatory Visit: Payer: Self-pay

## 2019-07-15 ENCOUNTER — Encounter: Payer: Self-pay | Admitting: Podiatry

## 2019-07-15 ENCOUNTER — Ambulatory Visit (INDEPENDENT_AMBULATORY_CARE_PROVIDER_SITE_OTHER): Payer: Medicare Other | Admitting: Podiatry

## 2019-07-15 DIAGNOSIS — B351 Tinea unguium: Secondary | ICD-10-CM

## 2019-07-15 DIAGNOSIS — E1151 Type 2 diabetes mellitus with diabetic peripheral angiopathy without gangrene: Secondary | ICD-10-CM | POA: Diagnosis not present

## 2019-07-15 DIAGNOSIS — D689 Coagulation defect, unspecified: Secondary | ICD-10-CM | POA: Diagnosis not present

## 2019-07-15 DIAGNOSIS — M79676 Pain in unspecified toe(s): Secondary | ICD-10-CM

## 2019-07-15 NOTE — Progress Notes (Signed)
Patient ID: Marvin BOZARD Sr., male   DOB: [birth year], 85 y.o.   MRN: [id number] HPI  Complaint:  Visit Type: Patient returns to my office for continued preventative foot care services. Complaint: Patient states" my nails have grown long and thick and become painful to walk and wear shoes" Patient has been diagnosed with DM . This patient  presents for preventative foot care services. No changes to ROS.  Patient presents to the office with his daughter. Patient is taking Eliquiss.  Podiatric Exam: Vascular: dorsalis pedis and posterior tibial pulses are non-palpable. Capillary return is diminished. Cold feet noted.. Skin turgor WNL, bilateral swelling  Sensorium: Diminished  Semmes Weinstein monofilament test. Normal tactile sensation bilaterally.  Nail Exam: Pt has thick disfigured discolored nails with subungual debris noted bilateral entire nail hallux through fifth toenails.  Blackened skin at distal nail due to pincer nails. Ulcer Exam: There is no evidence of ulcer or pre-ulcerative changes or infection. Orthopedic Exam: Muscle tone and strength are WNL. No limitations in general ROM. No crepitus or effusions noted. Foot type and digits show no abnormalities. Bony prominences are unremarkable. Skin: No Porokeratosis. No infection or ulcers  Diagnosis:  Onychomycosis, Pain in right toe, pain in left toes  Treatment & Plan Procedures and Treatment: Consent by patient was obtained for treatment procedures. The patient understood the discussion of treatment and procedures well. All questions were answered thoroughly reviewed. Debridement of mycotic and hypertrophic toenails, 1 through 5 bilateral and clearing of subungual debris. No ulceration, no infection noted. .  Return Visit-Office Procedure: Patient instructed to return to the office for a follow up visit 10 weeks  for continued evaluation and treatment.    Gardiner Barefoot DPM

## 2019-09-30 ENCOUNTER — Ambulatory Visit: Payer: Medicare Other | Admitting: Podiatry

## 2019-09-30 ENCOUNTER — Other Ambulatory Visit: Payer: Self-pay

## 2019-09-30 ENCOUNTER — Encounter: Payer: Self-pay | Admitting: Podiatry

## 2019-09-30 DIAGNOSIS — E1151 Type 2 diabetes mellitus with diabetic peripheral angiopathy without gangrene: Secondary | ICD-10-CM | POA: Diagnosis not present

## 2019-09-30 DIAGNOSIS — D689 Coagulation defect, unspecified: Secondary | ICD-10-CM

## 2019-09-30 DIAGNOSIS — M79676 Pain in unspecified toe(s): Secondary | ICD-10-CM | POA: Diagnosis not present

## 2019-09-30 DIAGNOSIS — B351 Tinea unguium: Secondary | ICD-10-CM | POA: Diagnosis not present

## 2019-09-30 NOTE — Progress Notes (Signed)
Patient ID: Marvin BOZARD Sr., male   DOB: [birth year], 85 y.o.   MRN: [id number] HPI  Complaint:  Visit Type: Patient returns to my office for continued preventative foot care services. Complaint: Patient states" my nails have grown long and thick and become painful to walk and wear shoes" Patient has been diagnosed with DM . This patient  presents for preventative foot care services. No changes to ROS.  Patient presents to the office with his daughter. Patient is taking Eliquiss.  Podiatric Exam: Vascular: dorsalis pedis and posterior tibial pulses are non-palpable. Capillary return is diminished. Cold feet noted.. Skin turgor WNL, bilateral swelling  Sensorium: Diminished  Semmes Weinstein monofilament test. Normal tactile sensation bilaterally.  Nail Exam: Pt has thick disfigured discolored nails with subungual debris noted bilateral entire nail hallux through fifth toenails.  Blackened skin at distal nail due to pincer nails. Ulcer Exam: There is no evidence of ulcer or pre-ulcerative changes or infection. Orthopedic Exam: Muscle tone and strength are WNL. No limitations in general ROM. No crepitus or effusions noted. Foot type and digits show no abnormalities. Bony prominences are unremarkable. Skin: No Porokeratosis. No infection or ulcers  Diagnosis:  Onychomycosis, Pain in right toe, pain in left toes  Treatment & Plan Procedures and Treatment: Consent by patient was obtained for treatment procedures. The patient understood the discussion of treatment and procedures well. All questions were answered thoroughly reviewed. Debridement of mycotic and hypertrophic toenails, 1 through 5 bilateral and clearing of subungual debris. No ulceration, no infection noted. .  Return Visit-Office Procedure: Patient instructed to return to the office for a follow up visit 10 weeks  for continued evaluation and treatment.    Gardiner Barefoot DPM

## 2019-11-14 ENCOUNTER — Emergency Department (HOSPITAL_COMMUNITY): Payer: Medicare PPO

## 2019-11-14 ENCOUNTER — Encounter (HOSPITAL_COMMUNITY): Payer: Self-pay | Admitting: *Deleted

## 2019-11-14 ENCOUNTER — Emergency Department (HOSPITAL_COMMUNITY)
Admission: EM | Admit: 2019-11-14 | Discharge: 2019-11-14 | Disposition: A | Discharge status: Home / Self Care | Payer: Medicare PPO | Attending: Internal Medicine | Admitting: Internal Medicine

## 2019-11-14 ENCOUNTER — Other Ambulatory Visit: Payer: Self-pay

## 2019-11-14 DIAGNOSIS — G20 Parkinson's disease: Secondary | ICD-10-CM

## 2019-11-14 DIAGNOSIS — Z8673 Personal history of transient ischemic attack (TIA), and cerebral infarction without residual deficits: Secondary | ICD-10-CM | POA: Diagnosis not present

## 2019-11-14 DIAGNOSIS — Z794 Long term (current) use of insulin: Secondary | ICD-10-CM | POA: Insufficient documentation

## 2019-11-14 DIAGNOSIS — Z79899 Other long term (current) drug therapy: Secondary | ICD-10-CM | POA: Diagnosis not present

## 2019-11-14 DIAGNOSIS — Z7901 Long term (current) use of anticoagulants: Secondary | ICD-10-CM | POA: Diagnosis not present

## 2019-11-14 DIAGNOSIS — Z8639 Personal history of other endocrine, nutritional and metabolic disease: Secondary | ICD-10-CM

## 2019-11-14 DIAGNOSIS — Z87891 Personal history of nicotine dependence: Secondary | ICD-10-CM | POA: Insufficient documentation

## 2019-11-14 DIAGNOSIS — R410 Disorientation, unspecified: Secondary | ICD-10-CM | POA: Diagnosis not present

## 2019-11-14 DIAGNOSIS — E1122 Type 2 diabetes mellitus with diabetic chronic kidney disease: Secondary | ICD-10-CM | POA: Diagnosis not present

## 2019-11-14 DIAGNOSIS — Z8546 Personal history of malignant neoplasm of prostate: Secondary | ICD-10-CM | POA: Diagnosis not present

## 2019-11-14 DIAGNOSIS — R778 Other specified abnormalities of plasma proteins: Secondary | ICD-10-CM | POA: Diagnosis not present

## 2019-11-14 DIAGNOSIS — R7989 Other specified abnormal findings of blood chemistry: Secondary | ICD-10-CM | POA: Diagnosis present

## 2019-11-14 DIAGNOSIS — I5042 Chronic combined systolic (congestive) and diastolic (congestive) heart failure: Secondary | ICD-10-CM | POA: Diagnosis not present

## 2019-11-14 DIAGNOSIS — R4182 Altered mental status, unspecified: Secondary | ICD-10-CM | POA: Diagnosis present

## 2019-11-14 DIAGNOSIS — I10 Essential (primary) hypertension: Secondary | ICD-10-CM | POA: Diagnosis present

## 2019-11-14 DIAGNOSIS — N183 Chronic kidney disease, stage 3 unspecified: Secondary | ICD-10-CM | POA: Diagnosis present

## 2019-11-14 DIAGNOSIS — F0390 Unspecified dementia without behavioral disturbance: Secondary | ICD-10-CM | POA: Diagnosis not present

## 2019-11-14 DIAGNOSIS — Z20822 Contact with and (suspected) exposure to covid-19: Secondary | ICD-10-CM | POA: Insufficient documentation

## 2019-11-14 DIAGNOSIS — E1159 Type 2 diabetes mellitus with other circulatory complications: Secondary | ICD-10-CM | POA: Diagnosis present

## 2019-11-14 DIAGNOSIS — F0280 Dementia in other diseases classified elsewhere without behavioral disturbance: Secondary | ICD-10-CM

## 2019-11-14 DIAGNOSIS — E1140 Type 2 diabetes mellitus with diabetic neuropathy, unspecified: Secondary | ICD-10-CM | POA: Insufficient documentation

## 2019-11-14 DIAGNOSIS — E876 Hypokalemia: Secondary | ICD-10-CM | POA: Diagnosis present

## 2019-11-14 DIAGNOSIS — I130 Hypertensive heart and chronic kidney disease with heart failure and stage 1 through stage 4 chronic kidney disease, or unspecified chronic kidney disease: Secondary | ICD-10-CM | POA: Insufficient documentation

## 2019-11-14 LAB — URINALYSIS, ROUTINE W REFLEX MICROSCOPIC
Bacteria, UA: NONE SEEN
Bilirubin Urine: NEGATIVE
Glucose, UA: NEGATIVE mg/dL
Ketones, ur: NEGATIVE mg/dL
Leukocytes,Ua: NEGATIVE
Nitrite: NEGATIVE
Protein, ur: NEGATIVE mg/dL
Specific Gravity, Urine: 1.01 (ref 1.005–1.030)
pH: 5 (ref 5.0–8.0)

## 2019-11-14 LAB — BASIC METABOLIC PANEL
Anion gap: 5 (ref 5–15)
BUN: 29 mg/dL — ABNORMAL HIGH (ref 8–23)
CO2: 24 mmol/L (ref 22–32)
Calcium: 7.2 mg/dL — ABNORMAL LOW (ref 8.9–10.3)
Chloride: 112 mmol/L — ABNORMAL HIGH (ref 98–111)
Creatinine, Ser: 1.72 mg/dL — ABNORMAL HIGH (ref 0.61–1.24)
GFR calc Af Amer: 38 mL/min — ABNORMAL LOW (ref >60)
GFR calc non Af Amer: 33 mL/min — ABNORMAL LOW (ref >60)
Glucose, Bld: 73 mg/dL (ref 70–99)
Potassium: 2.8 mmol/L — ABNORMAL LOW (ref 3.5–5.1)
Sodium: 141 mmol/L (ref 135–145)

## 2019-11-14 LAB — SARS CORONAVIRUS 2 (TAT 6-24 HRS): SARS Coronavirus 2: NEGATIVE

## 2019-11-14 LAB — CBC
HCT: 34.4 % — ABNORMAL LOW (ref 39.0–52.0)
Hemoglobin: 11.1 g/dL — ABNORMAL LOW (ref 13.0–17.0)
MCH: 26.9 pg (ref 26.0–34.0)
MCHC: 32.3 g/dL (ref 30.0–36.0)
MCV: 83.5 fL (ref 80.0–100.0)
Platelets: 194 10*3/uL (ref 150–400)
RBC: 4.12 MIL/uL — ABNORMAL LOW (ref 4.22–5.81)
RDW: 14.8 % (ref 11.5–15.5)
WBC: 6.4 10*3/uL (ref 4.0–10.5)
nRBC: 0 % (ref 0.0–0.2)

## 2019-11-14 LAB — TROPONIN I (HIGH SENSITIVITY)
Troponin I (High Sensitivity): 33 ng/L — ABNORMAL HIGH (ref <18)
Troponin I (High Sensitivity): 36 ng/L — ABNORMAL HIGH (ref <18)

## 2019-11-14 MED ORDER — ALUM & MAG HYDROXIDE-SIMETH 200-200-20 MG/5ML PO SUSP
30.0000 mL | Freq: Once | ORAL | Status: AC
  Administered 2019-11-14: 30 mL via ORAL
  Filled 2019-11-14: qty 30

## 2019-11-14 MED ORDER — ASPIRIN 81 MG PO CHEW
324.0000 mg | CHEWABLE_TABLET | Freq: Once | ORAL | Status: AC
  Administered 2019-11-14: 324 mg via ORAL
  Filled 2019-11-14: qty 4

## 2019-11-14 MED ORDER — POTASSIUM CHLORIDE 10 MEQ/100ML IV SOLN
10.0000 meq | INTRAVENOUS | Status: DC
  Administered 2019-11-14 (×2): 10 meq via INTRAVENOUS
  Filled 2019-11-14 (×2): qty 100

## 2019-11-14 MED ORDER — POTASSIUM CHLORIDE CRYS ER 20 MEQ PO TBCR
40.0000 meq | EXTENDED_RELEASE_TABLET | Freq: Once | ORAL | Status: AC
  Administered 2019-11-14: 11:00:00 40 meq via ORAL
  Filled 2019-11-14: qty 2

## 2019-11-14 MED ORDER — SODIUM CHLORIDE 0.9% FLUSH: 3.0000 mL | Freq: Once | INTRAVENOUS | Status: DC

## 2019-11-14 NOTE — ED Notes (Signed)
LUNCH Ordered at 11:28

## 2019-11-14 NOTE — Consult Note (Signed)
ER Consult Note   Marvin Bishop [id number] DOB: [birth year] DOA: 11/14/2019  PCP: [name], MD Consultants:  Prudence Davidson - Podiatry Patient coming from:  Home - lives with 24/7 caregiver (5 family members); NOK: Daughter, Bobbye Morton, [phone]  Chief Complaint: AMS  HPI: Marvin MAGRATH Sr. is a 86 y.o. male with medical history significant of prostate CA; dementia; HTN; HLD; MGUS; and DM presenting with AMS.  His daughter reports that for the last 7 days he has had a low BP - about 80/50.  He has been using a new glucometer and his glucose has been up and down, low in the mornings 60-80.  When she got him up about 3AM, he is usually awake and alert and can stand with coaxing to use the bathroom.  This AM he was weak and disoriented and hard to get started and he complained of his chest hurting on the left side.  It lasted maybe 30 minutes and resolved on its own.  He also complained of some myalgias about 6pm. He received his COVID vaccine #1 on Wednesday.  His daughter reports that he seems to be ok now.  He denies pain now.   ED Course:  AMS - ?related to COVID vaccine 48 hours ago.  A&O 0-1.  Last night, he was massaging his chest - ?cardiac chest pain.  Head CT negative.  CXR negative.  UA looks good.  Troponin 36.   Review of Systems: Unable to perform effectively   Ambulatory Status:  Ambulates with a walker and gait belt  Past Medical History:  Diagnosis Date  . Arthritis   . Asthma    hx of as child  . Complete loss of vision    right eye  . Dementia ([place])   . Diabetes mellitus   . Headache(784.0)    slight  . Hypercholesteremia   . Hypertension   . Insomnia    occasional  . Nephrolithiasis    right UVJ stone  . Peripheral neuropathy    both legs  . PONV (postoperative nausea and vomiting)   . Prostate cancer [facility])     Past Surgical History:  Procedure Laterality Date  . APPENDECTOMY    . Cornea removed Right 02-26-15  . CYSTOSCOPY W/ URETERAL STENT  PLACEMENT Right 10/01/2013   Procedure: cystoscopy right retrograde holmium laser lithotripsy;  Surgeon: Franchot Gallo, MD;  Location: [facility];  Service: Urology;  Laterality: Right;  holmium laser application  . HEMORROIDECTOMY    . KNEE ARTHROSCOPY Right   . PARS PLANA VITRECTOMY  10/24/2011   Procedure: PARS PLANA VITRECTOMY WITH 23 GAUGE;  Surgeon: Adonis Brook, MD;  Location: [facility];  Service: Ophthalmology;  Laterality: Right;  . TONSILLECTOMY  as teen    Social History   Socioeconomic History  . Marital status: Widowed    Spouse name: Not on file  . Number of children: 5  . Years of education: 6 th  . Highest education level: Not on file  Occupational History    Comment: Retired  Tobacco Use  . Smoking status: Former Smoker    Packs/day: 0.25    Types: Cigarettes    Quit date: 10/15/1966    Years since quitting: 53.1  . Smokeless tobacco: Never Used  Substance and Sexual Activity  . Alcohol use: Yes    Comment: occasional  . Drug use: No  . Sexual activity: Never  Other Topics Concern  . Not on file  Social History Narrative  Patient lives at home alone with dog.   Retired.   Education.- 6 th  Grade   Right handed.   Caffeine-Soda , tea and coffee            Social Determinants of Health   Financial Resource Strain:   . Difficulty of Paying Living Expenses: Not on file  Food Insecurity:   . Worried About Charity fundraiser in the Last Year: Not on file  . Ran Out of Food in the Last Year: Not on file  Transportation Needs:   . Lack of Transportation (Medical): Not on file  . Lack of Transportation (Non-Medical): Not on file  Physical Activity:   . Days of Exercise per Week: Not on file  . Minutes of Exercise per Session: Not on file  Stress:   . Feeling of Stress : Not on file  Social Connections:   . Frequency of Communication with Friends and Family: Not on file  . Frequency of Social Gatherings with Friends and Family: Not on file  . Attends  Religious Services: Not on file  . Active Member of Clubs or Organizations: Not on file  . Attends Archivist Meetings: Not on file  . Marital Status: Not on file  Intimate Partner Violence:   . Fear of Current or Ex-Partner: Not on file  . Emotionally Abused: Not on file  . Physically Abused: Not on file  . Sexually Abused: Not on file    Allergies  Allergen Reactions  . Donepezil Nausea And Vomiting  . Hydrocodone Nausea And Vomiting  . Hydrocodone-Acetaminophen Nausea Only  . Penicillins Hives and Swelling    Did it involve swelling of the face/tongue/throat, SOB, or low BP? No Did it involve sudden or severe rash/hives, skin peeling, or any reaction on the inside of your mouth or nose? Yes Did you need to seek medical attention at a hospital or doctor's office? Yes When did it last happen?per daughter If all above answers are "NO", may proceed with cephalosporin use.     Family History  Problem Relation Age of Onset  . Cancer Mother        uterus  . Heart Problems Father   . Cancer Brother        stomach  . Cancer Brother        throat  . Colon cancer Brother     Prior to Admission medications   Medication Sig Start Date End Date Taking? Authorizing Provider  acetaminophen (TYLENOL) 325 MG tablet Take 2 tablets (650 mg total) by mouth every 4 (four) hours as needed for mild pain (or temp > 37.5 C (99.5 F)). 05/31/17   Raiford Noble Latif, DO  apixaban (ELIQUIS) 5 MG TABS tablet Take 1 tablet (5 mg total) by mouth 2 (two) times daily. 05/31/17   Sheikh, Georgina Quint Latif, DO  BD PEN NEEDLE NANO U/F 32G X 4 MM MISC See admin instructions. 07/13/15   [provider]  Carboxymethylcellul-Glycerin 0.5-0.9 % SOLN Apply 1 drop to eye daily after breakfast.     [provider]  cefpodoxime (VANTIN) 200 MG tablet Take 1 tablet (200 mg total) by mouth every 12 (twelve) hours. 05/31/17   Raiford Noble Latif, DO  docusate sodium 100 MG CAPS Take 200 mg by  mouth 2 (two) times daily as needed for mild constipation. Patient taking differently: Take 200 mg by mouth 3 (three) times a week.  08/25/14   Thurnell Lose, MD  erythromycin ophthalmic ointment  Place 1 application into the right eye at bedtime. 05/03/17   [provider]  esomeprazole (NEXIUM) 40 MG capsule Take 1 capsule (40 mg total) by mouth 2 (two) times daily before a meal. 04/04/15   Barton Dubois, MD  famotidine (PEPCID) 20 MG tablet Take 1 tablet (20 mg total) by mouth at bedtime. 04/04/15   Barton Dubois, MD  furosemide (LASIX) 40 MG tablet Take 40 mg by mouth daily after breakfast.     [provider]  gabapentin (NEURONTIN) 100 MG capsule TAKE 2 CAPSULES BY MOUTH EVERY DAY AT BEDTIME 09/21/15   [provider]  glipiZIDE (GLUCOTROL XL) 10 MG 24 hr tablet Take 10 mg by mouth daily with breakfast.     [provider]  HUMALOG KWIKPEN 100 UNIT/ML KiwkPen Inject 2-7 Units into the skin 2 (two) times daily with a meal. 2 units if sugar level 150-200 and 7 units when above 200 06/08/16   [provider]  LEVEMIR FLEXTOUCH 100 UNIT/ML Pen 10 UNIT TWICE A DAY SUBCUTANEOUS 04/08/18   [provider]  Loratadine (CLARITIN) 10 MG CAPS Take 10 mg by mouth daily after breakfast.     [provider]  memantine (NAMENDA) 10 MG tablet Take 1 tablet (10 mg total) by mouth 2 (two) times daily. Please call [phone] to schedule yearly appt. 04/16/17   Marcial Pacas, MD  metoprolol tartrate (LOPRESSOR) 25 MG tablet Take 12.5 mg by mouth 2 (two) times daily.  06/23/15   [provider]  Multiple Vitamins-Minerals (ELDERTONIC PO) Take 15 mLs by mouth 2 (two) times daily.    [provider]  ondansetron (ZOFRAN) 4 MG tablet Take 4 mg by mouth every 6 (six) hours as needed. for nausea 08/14/18   [provider]  ONE TOUCH ULTRA TEST test strip USE TO TEST TWICE A DAY 25 07/03/18   [provider]  polyethylene glycol  (MIRALAX / GLYCOLAX) packet Take 17 g by mouth daily as needed for mild constipation. Takes on [facility] 04/29/15   [provider]  predniSONE (DELTASONE) 20 MG tablet 2 tabs po daily x 4 days 10/20/18   Mesner, Corene Cornea, MD  senna-docusate (SENOKOT-S) 8.6-50 MG tablet Take 1 tablet by mouth at bedtime as needed for moderate constipation. 05/31/17   Raiford Noble Latif, DO  simvastatin (ZOCOR) 40 MG tablet Take 40 mg by mouth every morning.     [provider]    Physical Exam: Vitals:   11/14/19 0815 11/14/19 0835 11/14/19 0845 11/14/19 0930  BP: 106/63 118/72 110/78 107/65  Pulse:  69 69   Resp: 13 16 17 19   Temp:      TempSrc:      SpO2:  97% 99% 99%     . General:  Appears calm and comfortable and is NAD; unable to effectively answer questions due to dementia . Eyes:  PERRL, EOMI, normal lids, iris . ENT:  grossly normal hearing, lips & tongue, mmm; appropriate dentition . Neck:  no LAD, masses or thyromegaly . Cardiovascular:  RRR, no m/r/g. No LE edema.  Marland Kitchen Respiratory:   CTA bilaterally with no wheezes/rales/rhonchi.  Normal respiratory effort. . Abdomen:  soft, NT, ND, NABS . Skin:  no rash or induration seen on limited exam . Musculoskeletal:  grossly normal tone BUE/BLE, good ROM, no bony abnormality . Psychiatric:  Pleasant but confused mood and affect, speech sparse, AOx 0-1 . Neurologic:  Unable to effectively perform    Radiological Exams on Admission: DG  Chest 2 View  Result Date: 11/14/2019 CLINICAL DATA:  Patient with chest pain. EXAM: CHEST - 2 VIEW COMPARISON:  Chest radiograph 11/05/2018 FINDINGS: Monitoring leads overlie the patient. Stable cardiac and mediastinal contours. No consolidative pulmonary opacities. No pleural effusion or pneumothorax. Thoracic spine degenerative changes. IMPRESSION: No acute cardiopulmonary process. Electronically Signed   By: Lovey Newcomer M.D.   On: 11/14/2019 04:47   CT Head Wo Contrast  Result Date: 11/14/2019 CLINICAL  DATA:  Dementia.  Mental status changes. EXAM: CT HEAD WITHOUT CONTRAST TECHNIQUE: Contiguous axial images were obtained from the base of the skull through the vertex without intravenous contrast. COMPARISON:  05/27/2017 FINDINGS: Brain: Brain atrophy with frontal and temporal predominance, progressive since 2018. Chronic small-vessel ischemic changes of the white matter. No sign of acute infarction, mass lesion, hemorrhage, hydrocephalus or extra-axial collection. Vascular: There is atherosclerotic calcification of the major vessels at the base of the brain. Skull: Negative Sinuses/Orbits: Sinuses are clear.  Prosthetic globe on the right. Other: None IMPRESSION: No acute or reversible finding. Brain atrophy, progressive since 2018. Chronic small-vessel ischemic changes of the white matter. Electronically Signed   By: Nelson Chimes M.D.   On: 11/14/2019 05:57    EKG: Independently reviewed.  NSR with rate 74; no evidence of acute ischemia   Labs on Admission: I have personally reviewed the available labs and imaging studies at the time of the admission.  Pertinent labs:   K+ 2.8 BUN 29/Creatinine 1.72/GFR 38 - stable HS troponin 33, 36 WBC 6.4 Hgb 11.1 UA: small Hgb   Assessment/Plan Principal Problem:   Altered mental status, unspecified Active Problems:   Hypokalemia   Hypertension   History of insulin dependent diabetes mellitus   Dementia without behavioral disturbance (HCC)   Elevated troponin   Chronic combined systolic and diastolic congestive heart failure (HCC)   CKD (chronic kidney disease), stage III    AMS -Patient presenting with family concern for AMS based on the fact that he was weak and disoriented when his daughter woke him up and stood him up to void about 0300 -While the patient does have underlying dementia, this was a change compared to his usual baseline mental status, per the daughter -Evaluation thus far unremarkable -At the time of my evaluation the  patient was back to baseline -He was able to eat and was back to his usual ambulatory status -As such, I offered the daughter overnight observation with the understanding that patients with dementia do not tend to do as well in the hospital as they do in their home environment -She was also concerned about possible exposure to COVID-19 infection -As such, the daughter preferred to take the patient home; he has 24/7 caregivers and this seems quite appropriate -50% of patients with delirium while hospitalized will be institutionalized at 6 months, and these patients have a 25% mortality at 6 months -The family would benefit from being referred to the [facility] and also provided with the [employer] website as an outpatient  Elevated troponin -His daughter did voice some concern for left-sided chest pain -Given the patient's dementia, it is hard to know for sure what the quality and circumstances were regarding the pain -EKG non-acute -HS troponin was mildly elevated with a negative delta; it was also elevated in 2018 -He is not a candidate for catheterization at this time and further evaluation does not appear to be indicated -He did recently go off Eliquis  Hypokalemia -PO Kdur 40 mEq +  20 mEq IV KCl given -He needs repeat of BMP later this week with PCP    Continue other home meds and f/u with PCP later this week.   The patient was discharged from the ER in the care of his attentive daughter.     Karmen Bongo MD [facility]   How to contact the [facility] Attending or Consulting provider [place] or covering provider during after hours [place], for this patient?  1. Check the care team in [facility] and look for a) attending/consulting [facility] provider listed and b) the [facility] team listed 2. Log into [email or web address] and use [place]'s universal password to access. If you do not have the password, please contact the hospital operator. 3. Locate the [facility] provider you are looking  for under [facility] and page to a number that you can be directly reached. 4. If you still have difficulty reaching the provider, please page the [facility] (Director on Call) for the Hospitalists listed on amion for assistance.   11/14/2019, 4:52 PM

## 2019-11-14 NOTE — ED Notes (Signed)
Patient verbalizes understanding of discharge instructions. Opportunity for questioning and answers were provided. Armband removed by staff, pt discharged from ED to home with daughter, caregiver will be in home upon arrival.

## 2019-11-14 NOTE — ED Triage Notes (Signed)
Pt arrives via [facility] from home. Per EMS report, has not been acting right since he got his covid shot two days ago. After the pt ate, the patient was massaging his chest, unsure if he was having pain or not. Pt has dementia. Given ASA en route 324 mg. Initial pressure 82/46, cbg 93, hr 70's. IV established in the left forearm, NS 50cc given. Repeat pressure 110's SBP.

## 2019-11-14 NOTE — ED Provider Notes (Addendum)
[facility] EMERGENCY DEPARTMENT Provider Note   CSN: [id number] Arrival date & time: 11/14/19  0411     History Chief Complaint  Patient presents with  . Altered Mental Status    Marvin BITNER Sr. is a 86 y.o. male.  The history is provided by the EMS personnel. The history is limited by the condition of the patient (level 5 dementia).  Altered Mental Status Presenting symptoms: behavior changes and confusion   Severity:  Moderate Most recent episode:  2 days ago Episode history:  Single Duration:  2 days Timing:  Constant Progression:  Unchanged Chronicity:  New (reportedly since getting his covid vaccine ) Context: dementia   Context: not alcohol use   Associated symptoms: no abdominal pain, no decreased appetite, no eye deviation, no fever and no vomiting   Associated symptoms comment:  Apparently rubbed his chest tonight and family sent the patient in for this Patient was reportedly hypotensive for EMS and was given a bolus with normal BP.       Past Medical History:  Diagnosis Date  . Arthritis   . Asthma    hx of as child  . Complete loss of vision    right eye  . Dementia ([place])   . Diabetes mellitus   . Headache(784.0)    slight  . Hypercholesteremia   . Hypertension   . Insomnia    occasional  . Memory loss   . Nephrolithiasis    right UVJ stone  . Peripheral neuropathy    both legs  . PONV (postoperative nausea and vomiting)   . Prostate cancer [facility])     Patient Active Problem List   Diagnosis Date Noted  . Pre-ulcerative calluses 04/29/2019  . Hypophosphatemia 05/31/2017  . TIA (transient ischemic attack) 05/27/2017  . Sepsis ([place]) 05/27/2017  . CKD (chronic kidney disease), stage III 05/27/2017  . Influenza 11/08/2016  . Chronic combined systolic and diastolic congestive heart failure ([place]) 11/08/2016  . History of pulmonary embolus (PE) 11/08/2016  . PE (pulmonary embolism) 04/07/2015  . Pulmonary embolism ([place])  04/07/2015  . Elevated troponin 04/07/2015  . Cough   . Pulmonary emboli ([place])   . Sinus tachycardia 04/01/2015  . Acid reflux 03/07/2015  . Insulin dependent type 2 diabetes mellitus ([place]) 03/07/2015  . Blind painful eye 01/07/2015  . Ileus ([place]) 08/23/2014  . Dementia without behavioral disturbance ([place]) 09/02/2013  . Renal calculus or stone 08/22/2013  . Hypokalemia 08/22/2013  . Hypertension 08/22/2013  . IDDM (insulin dependent diabetes mellitus) ([place]) 08/22/2013  . Dehydration 08/22/2013  . Memory loss 04/23/2013  . Insomnia   . Carpal tunnel syndrome   . Diverticulosis     Past Surgical History:  Procedure Laterality Date  . APPENDECTOMY    . Cornea removed Right 02-26-15  . CYSTOSCOPY W/ URETERAL STENT PLACEMENT Right 10/01/2013   Procedure: cystoscopy right retrograde holmium laser lithotripsy;  Surgeon: Franchot Gallo, MD;  Location: [facility];  Service: Urology;  Laterality: Right;  holmium laser application  . HEMORROIDECTOMY    . KNEE ARTHROSCOPY Right   . PARS PLANA VITRECTOMY  10/24/2011   Procedure: PARS PLANA VITRECTOMY WITH 23 GAUGE;  Surgeon: Adonis Brook, MD;  Location: [facility];  Service: Ophthalmology;  Laterality: Right;  . TONSILLECTOMY  as teen       Family History  Problem Relation Age of Onset  . Cancer Mother        uterus  . Heart Problems Father   .  Cancer Brother        stomach  . Cancer Brother        throat  . Colon cancer Brother     Social History   Tobacco Use  . Smoking status: Former Smoker    Packs/day: 0.25    Types: Cigarettes    Quit date: 10/15/1966    Years since quitting: 53.1  . Smokeless tobacco: Never Used  Substance Use Topics  . Alcohol use: Yes    Comment: occasional  . Drug use: No    Home Medications Prior to Admission medications   Medication Sig Start Date End Date Taking? Authorizing Provider  acetaminophen (TYLENOL) 325 MG tablet Take 2 tablets (650 mg total) by mouth every 4 (four) hours as needed for  mild pain (or temp > 37.5 C (99.5 F)). 05/31/17   Raiford Noble Latif, DO  apixaban (ELIQUIS) 5 MG TABS tablet Take 1 tablet (5 mg total) by mouth 2 (two) times daily. 05/31/17   Sheikh, Georgina Quint Latif, DO  BD PEN NEEDLE NANO U/F 32G X 4 MM MISC See admin instructions. 07/13/15   [provider]  Carboxymethylcellul-Glycerin 0.5-0.9 % SOLN Apply 1 drop to eye daily after breakfast.     [provider]  cefpodoxime (VANTIN) 200 MG tablet Take 1 tablet (200 mg total) by mouth every 12 (twelve) hours. 05/31/17   Raiford Noble Latif, DO  docusate sodium 100 MG CAPS Take 200 mg by mouth 2 (two) times daily as needed for mild constipation. Patient taking differently: Take 200 mg by mouth 3 (three) times a week.  08/25/14   Thurnell Lose, MD  erythromycin ophthalmic ointment Place 1 application into the right eye at bedtime. 05/03/17   [provider]  esomeprazole (NEXIUM) 40 MG capsule Take 1 capsule (40 mg total) by mouth 2 (two) times daily before a meal. 04/04/15   Barton Dubois, MD  famotidine (PEPCID) 20 MG tablet Take 1 tablet (20 mg total) by mouth at bedtime. 04/04/15   Barton Dubois, MD  furosemide (LASIX) 40 MG tablet Take 40 mg by mouth daily after breakfast.     [provider]  gabapentin (NEURONTIN) 100 MG capsule TAKE 2 CAPSULES BY MOUTH EVERY DAY AT BEDTIME 09/21/15   [provider]  glipiZIDE (GLUCOTROL XL) 10 MG 24 hr tablet Take 10 mg by mouth daily with breakfast.     [provider]  HUMALOG KWIKPEN 100 UNIT/ML KiwkPen Inject 2-7 Units into the skin 2 (two) times daily with a meal. 2 units if sugar level 150-200 and 7 units when above 200 06/08/16   [provider]  LEVEMIR FLEXTOUCH 100 UNIT/ML Pen 10 UNIT TWICE A DAY SUBCUTANEOUS 04/08/18   [provider]  Loratadine (CLARITIN) 10 MG CAPS Take 10 mg by mouth daily after breakfast.     [provider]  memantine (NAMENDA) 10 MG tablet Take 1 tablet (10 mg  total) by mouth 2 (two) times daily. Please call [phone] to schedule yearly appt. 04/16/17   Marcial Pacas, MD  metoprolol tartrate (LOPRESSOR) 25 MG tablet Take 12.5 mg by mouth 2 (two) times daily.  06/23/15   [provider]  Multiple Vitamins-Minerals (ELDERTONIC PO) Take 15 mLs by mouth 2 (two) times daily.    [provider]  ondansetron (ZOFRAN) 4 MG tablet Take 4 mg by mouth every 6 (six) hours as needed. for nausea 08/14/18   [provider]  ONE TOUCH ULTRA TEST test strip USE TO  TEST TWICE A DAY 25 07/03/18   [provider]  polyethylene glycol (MIRALAX / GLYCOLAX) packet Take 17 g by mouth daily as needed for mild constipation. Takes on [facility] 04/29/15   [provider]  predniSONE (DELTASONE) 20 MG tablet 2 tabs po daily x 4 days 10/20/18   Mesner, Corene Cornea, MD  senna-docusate (SENOKOT-S) 8.6-50 MG tablet Take 1 tablet by mouth at bedtime as needed for moderate constipation. 05/31/17   Raiford Noble Latif, DO  simvastatin (ZOCOR) 40 MG tablet Take 40 mg by mouth every morning.     [provider]    Allergies    Donepezil, Hydrocodone, and Hydrocodone-acetaminophen  Review of Systems   Review of Systems  Unable to perform ROS: Dementia  Constitutional: Negative for decreased appetite and fever.  Respiratory: Negative for cough.   Gastrointestinal: Negative for abdominal pain and vomiting.  Psychiatric/Behavioral: Positive for confusion.    Physical Exam Updated Vital Signs BP 120/61 (BP Location: Right Arm)   Pulse 67   Temp (!) 97.4 F (36.3 C) (Oral)   Resp 18   SpO2 100%   Physical Exam Vitals and nursing note reviewed.  Constitutional:      Appearance: He is not diaphoretic.  HENT:     Head: Normocephalic and atraumatic.     Nose: Nose normal.  Eyes:     Extraocular Movements: Extraocular movements intact.     Conjunctiva/sclera: Conjunctivae normal.     Pupils: Pupils are equal, round, and reactive to light.    Cardiovascular:     Rate and Rhythm: Normal rate and regular rhythm.     Pulses: Normal pulses.     Heart sounds: Normal heart sounds.  Pulmonary:     Effort: Pulmonary effort is normal.     Breath sounds: Normal breath sounds.  Abdominal:     General: Abdomen is flat.     Palpations: Abdomen is soft.     Tenderness: There is no abdominal tenderness. There is no guarding or rebound.     Comments: Gassy throughout   Musculoskeletal:        General: Normal range of motion.     Cervical back: Normal range of motion and neck supple.  Skin:    General: Skin is warm and dry.     Capillary Refill: Capillary refill takes less than 2 seconds.  Neurological:     General: No focal deficit present.     Mental Status: He is alert.     Comments: Awake and denies all pain but is only oriented to self   Psychiatric:        Behavior: Behavior normal.     ED Results / Procedures / Treatments   Labs (all labs ordered are listed, but only abnormal results are displayed) Results for orders placed or performed during the hospital encounter of 0000000  Basic metabolic panel  Result Value Ref Range   Sodium 141 135 - 145 mmol/L   Potassium 2.8 (L) 3.5 - 5.1 mmol/L   Chloride 112 (H) 98 - 111 mmol/L   CO2 24 22 - 32 mmol/L   Glucose, Bld 73 70 - 99 mg/dL   BUN 29 (H) 8 - 23 mg/dL   Creatinine, Ser 1.72 (H) 0.61 - 1.24 mg/dL   Calcium 7.2 (L) 8.9 - 10.3 mg/dL   GFR calc non Af Amer 33 (L) >60 mL/min   GFR calc Af Amer 38 (L) >60 mL/min   Anion gap 5 5 - 15  CBC  Result Value Ref Range   WBC 6.4 4.0 - 10.5 K/uL   RBC 4.12 (L) 4.22 - 5.81 MIL/uL   Hemoglobin 11.1 (L) 13.0 - 17.0 g/dL   HCT 34.4 (L) 39.0 - 52.0 %   MCV 83.5 80.0 - 100.0 fL   MCH 26.9 26.0 - 34.0 pg   MCHC 32.3 30.0 - 36.0 g/dL   RDW 14.8 11.5 - 15.5 %   Platelets 194 150 - 400 K/uL   nRBC 0.0 0.0 - 0.2 %  Troponin I (High Sensitivity)  Result Value Ref Range   Troponin I (High Sensitivity) 33 (H) <18 ng/L   DG Chest  2 View  Result Date: 11/14/2019 CLINICAL DATA:  Patient with chest pain. EXAM: CHEST - 2 VIEW COMPARISON:  Chest radiograph 11/05/2018 FINDINGS: Monitoring leads overlie the patient. Stable cardiac and mediastinal contours. No consolidative pulmonary opacities. No pleural effusion or pneumothorax. Thoracic spine degenerative changes. IMPRESSION: No acute cardiopulmonary process. Electronically Signed   By: Lovey Newcomer M.D.   On: 11/14/2019 04:47   CT Head Wo Contrast  Result Date: 11/14/2019 CLINICAL DATA:  Dementia.  Mental status changes. EXAM: CT HEAD WITHOUT CONTRAST TECHNIQUE: Contiguous axial images were obtained from the base of the skull through the vertex without intravenous contrast. COMPARISON:  05/27/2017 FINDINGS: Brain: Brain atrophy with frontal and temporal predominance, progressive since 2018. Chronic small-vessel ischemic changes of the white matter. No sign of acute infarction, mass lesion, hemorrhage, hydrocephalus or extra-axial collection. Vascular: There is atherosclerotic calcification of the major vessels at the base of the brain. Skull: Negative Sinuses/Orbits: Sinuses are clear.  Prosthetic globe on the right. Other: None IMPRESSION: No acute or reversible finding. Brain atrophy, progressive since 2018. Chronic small-vessel ischemic changes of the white matter. Electronically Signed   By: Nelson Chimes M.D.   On: 11/14/2019 05:57    EKG EKG Interpretation  Date/Time:  Saturday November 14 2019 04:22:37 EST Ventricular Rate:  74 PR Interval:    QRS Duration: 101 QT Interval:  427 QTC Calculation: 474 R Axis:   -32 Text Interpretation: Sinus rhythm Confirmed by Dory Horn) on 11/14/2019 5:21:25 AM   Radiology DG Chest 2 View  Result Date: 11/14/2019 CLINICAL DATA:  Patient with chest pain. EXAM: CHEST - 2 VIEW COMPARISON:  Chest radiograph 11/05/2018 FINDINGS: Monitoring leads overlie the patient. Stable cardiac and mediastinal contours. No consolidative  pulmonary opacities. No pleural effusion or pneumothorax. Thoracic spine degenerative changes. IMPRESSION: No acute cardiopulmonary process. Electronically Signed   By: Lovey Newcomer M.D.   On: 11/14/2019 04:47   CT Head Wo Contrast  Result Date: 11/14/2019 CLINICAL DATA:  Dementia.  Mental status changes. EXAM: CT HEAD WITHOUT CONTRAST TECHNIQUE: Contiguous axial images were obtained from the base of the skull through the vertex without intravenous contrast. COMPARISON:  05/27/2017 FINDINGS: Brain: Brain atrophy with frontal and temporal predominance, progressive since 2018. Chronic small-vessel ischemic changes of the white matter. No sign of acute infarction, mass lesion, hemorrhage, hydrocephalus or extra-axial collection. Vascular: There is atherosclerotic calcification of the major vessels at the base of the brain. Skull: Negative Sinuses/Orbits: Sinuses are clear.  Prosthetic globe on the right. Other: None IMPRESSION: No acute or reversible finding. Brain atrophy, progressive since 2018. Chronic small-vessel ischemic changes of the white matter. Electronically Signed   By: Nelson Chimes M.D.   On: 11/14/2019 05:57    Procedures Procedures (including critical care time)  Medications Ordered in ED Medications  sodium chloride flush (NS) 0.9 %  injection 3 mL (3 mLs Intravenous Not Given 11/14/19 0425)  potassium chloride 10 mEq in 100 mL IVPB (10 mEq Intravenous New Bag/Given 11/14/19 0649)  alum & mag hydroxide-simeth (MAALOX/MYLANTA) 200-200-20 MG/5ML suspension 30 mL (30 mLs Oral Given 11/14/19 0556)  aspirin chewable tablet 324 mg (324 mg Oral Given 11/14/19 [id number])    ED Course  I have reviewed the triage vital signs and the nursing notes.  Pertinent labs & imaging results that were available during my care of the patient were reviewed by me and considered in my medical decision making (see chart for details).   Patient is resting comfortably denying all complaints at this time.  Troponin is  elevated with heart score of 5 and with the patient's family stating he was "massaging" his chest he will need a rule out. The patient appears to be pleasantly demented and I do not see signs of AMS.    Will admit to medicine    Olayinka Gathers, MD 11/14/19 Fisher, Zanyah Lentsch, MD 11/14/19 [id number]

## 2019-11-14 NOTE — ED Notes (Signed)
Patient transported to CT 

## 2019-12-30 ENCOUNTER — Ambulatory Visit: Payer: Self-pay | Admitting: Podiatry

## 2020-01-05 ENCOUNTER — Other Ambulatory Visit: Payer: Self-pay

## 2020-01-05 ENCOUNTER — Encounter: Payer: Self-pay | Admitting: Podiatry

## 2020-01-05 ENCOUNTER — Ambulatory Visit: Payer: Medicare PPO | Admitting: Podiatry

## 2020-01-05 VITALS — Temp 96.9°F

## 2020-01-05 DIAGNOSIS — M79676 Pain in unspecified toe(s): Secondary | ICD-10-CM | POA: Diagnosis not present

## 2020-01-05 DIAGNOSIS — L608 Other nail disorders: Secondary | ICD-10-CM

## 2020-01-05 DIAGNOSIS — B351 Tinea unguium: Secondary | ICD-10-CM | POA: Diagnosis not present

## 2020-01-05 DIAGNOSIS — E1151 Type 2 diabetes mellitus with diabetic peripheral angiopathy without gangrene: Secondary | ICD-10-CM | POA: Diagnosis not present

## 2020-01-05 DIAGNOSIS — D689 Coagulation defect, unspecified: Secondary | ICD-10-CM | POA: Diagnosis not present

## 2020-01-05 NOTE — Progress Notes (Signed)
This patient returns to my office for at risk foot care.  This patient requires this care by a professional since this patient will be at risk due to having chronic kidney disease, type 2 diabetes.  This patient is unable to cut nails himself since the patient cannot reach his nails.These nails are painful  wearing shoes.  Patient presents to the office in a wheelchair.  Patient is accompanied by his daughter. Patient is taking eliquiss. This patient presents for at risk foot care today.  General Appearance  Alert, conversant and in no acute stress.  Vascular  Dorsalis pedis and posterior tibial  pulses are non  palpable  bilaterally.  Capillary return is within normal limits  bilaterally. Temperature is within normal limits  bilaterally.  Neurologic  Senn-Weinstein monofilament wire test diminished  bilaterally. Muscle power within normal limits bilaterally.  Nails Thick disfigured discolored nails with subungual debris  from hallux to fifth toes bilaterally. No evidence of bacterial infection or drainage bilaterally.  Orthopedic  No limitations of motion  feet .  No crepitus or effusions noted.  No bony pathology or digital deformities noted.  Skin  normotropic skin with no porokeratosis noted bilaterally.  No signs of infections or ulcers noted.     Onychomycosis  Pain in right toes  Pain in left toes  Consent was obtained for treatment procedures.   Mechanical debridement of nails 1-5  bilaterally performed with a nail nipper.  Filed with dremel without incident. No infection or ulcer.     Return office visit   3 months                   Told patient to return for periodic foot care and evaluation due to potential at risk complications.   Mildred Bollard DPM  

## 2020-02-02 DIAGNOSIS — R319 Hematuria, unspecified: Secondary | ICD-10-CM | POA: Diagnosis not present

## 2020-02-05 DIAGNOSIS — R319 Hematuria, unspecified: Secondary | ICD-10-CM | POA: Diagnosis not present

## 2020-02-11 DIAGNOSIS — E1140 Type 2 diabetes mellitus with diabetic neuropathy, unspecified: Secondary | ICD-10-CM | POA: Diagnosis not present

## 2020-02-11 DIAGNOSIS — Z8546 Personal history of malignant neoplasm of prostate: Secondary | ICD-10-CM | POA: Diagnosis not present

## 2020-02-11 DIAGNOSIS — E782 Mixed hyperlipidemia: Secondary | ICD-10-CM | POA: Diagnosis not present

## 2020-02-11 DIAGNOSIS — I5030 Unspecified diastolic (congestive) heart failure: Secondary | ICD-10-CM | POA: Diagnosis not present

## 2020-02-11 DIAGNOSIS — C61 Malignant neoplasm of prostate: Secondary | ICD-10-CM | POA: Diagnosis not present

## 2020-02-11 DIAGNOSIS — I10 Essential (primary) hypertension: Secondary | ICD-10-CM | POA: Diagnosis not present

## 2020-02-11 DIAGNOSIS — M1990 Unspecified osteoarthritis, unspecified site: Secondary | ICD-10-CM | POA: Diagnosis not present

## 2020-02-11 DIAGNOSIS — E1122 Type 2 diabetes mellitus with diabetic chronic kidney disease: Secondary | ICD-10-CM | POA: Diagnosis not present

## 2020-02-11 DIAGNOSIS — E119 Type 2 diabetes mellitus without complications: Secondary | ICD-10-CM | POA: Diagnosis not present

## 2020-02-13 DIAGNOSIS — E1065 Type 1 diabetes mellitus with hyperglycemia: Secondary | ICD-10-CM | POA: Diagnosis not present

## 2020-02-22 DIAGNOSIS — N390 Urinary tract infection, site not specified: Secondary | ICD-10-CM | POA: Diagnosis not present

## 2020-03-15 DIAGNOSIS — K921 Melena: Secondary | ICD-10-CM | POA: Diagnosis not present

## 2020-03-15 DIAGNOSIS — K649 Unspecified hemorrhoids: Secondary | ICD-10-CM | POA: Diagnosis not present

## 2020-03-15 DIAGNOSIS — Z7901 Long term (current) use of anticoagulants: Secondary | ICD-10-CM | POA: Diagnosis not present

## 2020-03-21 DIAGNOSIS — E11649 Type 2 diabetes mellitus with hypoglycemia without coma: Secondary | ICD-10-CM | POA: Diagnosis not present

## 2020-03-21 DIAGNOSIS — I5030 Unspecified diastolic (congestive) heart failure: Secondary | ICD-10-CM | POA: Diagnosis not present

## 2020-03-21 DIAGNOSIS — I10 Essential (primary) hypertension: Secondary | ICD-10-CM | POA: Diagnosis not present

## 2020-03-21 DIAGNOSIS — D649 Anemia, unspecified: Secondary | ICD-10-CM | POA: Diagnosis not present

## 2020-03-21 DIAGNOSIS — F0390 Unspecified dementia without behavioral disturbance: Secondary | ICD-10-CM | POA: Diagnosis not present

## 2020-03-21 DIAGNOSIS — E1122 Type 2 diabetes mellitus with diabetic chronic kidney disease: Secondary | ICD-10-CM | POA: Diagnosis not present

## 2020-03-21 DIAGNOSIS — E1165 Type 2 diabetes mellitus with hyperglycemia: Secondary | ICD-10-CM | POA: Diagnosis not present

## 2020-03-21 DIAGNOSIS — E782 Mixed hyperlipidemia: Secondary | ICD-10-CM | POA: Diagnosis not present

## 2020-03-21 DIAGNOSIS — C61 Malignant neoplasm of prostate: Secondary | ICD-10-CM | POA: Diagnosis not present

## 2020-03-26 ENCOUNTER — Emergency Department (HOSPITAL_COMMUNITY): Payer: Medicare PPO

## 2020-03-26 ENCOUNTER — Other Ambulatory Visit: Payer: Self-pay

## 2020-03-26 ENCOUNTER — Emergency Department (HOSPITAL_COMMUNITY)
Admission: EM | Admit: 2020-03-26 | Discharge: 2020-03-27 | Disposition: A | Discharge status: Home / Self Care | Payer: Medicare PPO | Attending: Emergency Medicine | Admitting: Emergency Medicine

## 2020-03-26 DIAGNOSIS — N183 Chronic kidney disease, stage 3 unspecified: Secondary | ICD-10-CM | POA: Insufficient documentation

## 2020-03-26 DIAGNOSIS — Z87891 Personal history of nicotine dependence: Secondary | ICD-10-CM | POA: Insufficient documentation

## 2020-03-26 DIAGNOSIS — E1122 Type 2 diabetes mellitus with diabetic chronic kidney disease: Secondary | ICD-10-CM | POA: Insufficient documentation

## 2020-03-26 DIAGNOSIS — R4182 Altered mental status, unspecified: Secondary | ICD-10-CM

## 2020-03-26 DIAGNOSIS — J45909 Unspecified asthma, uncomplicated: Secondary | ICD-10-CM | POA: Insufficient documentation

## 2020-03-26 DIAGNOSIS — Z8659 Personal history of other mental and behavioral disorders: Secondary | ICD-10-CM

## 2020-03-26 DIAGNOSIS — Z9114 Patient's other noncompliance with medication regimen: Secondary | ICD-10-CM | POA: Insufficient documentation

## 2020-03-26 DIAGNOSIS — I959 Hypotension, unspecified: Secondary | ICD-10-CM | POA: Diagnosis not present

## 2020-03-26 DIAGNOSIS — I491 Atrial premature depolarization: Secondary | ICD-10-CM | POA: Diagnosis not present

## 2020-03-26 DIAGNOSIS — I5042 Chronic combined systolic (congestive) and diastolic (congestive) heart failure: Secondary | ICD-10-CM | POA: Diagnosis not present

## 2020-03-26 DIAGNOSIS — Z8546 Personal history of malignant neoplasm of prostate: Secondary | ICD-10-CM | POA: Diagnosis not present

## 2020-03-26 DIAGNOSIS — I130 Hypertensive heart and chronic kidney disease with heart failure and stage 1 through stage 4 chronic kidney disease, or unspecified chronic kidney disease: Secondary | ICD-10-CM | POA: Insufficient documentation

## 2020-03-26 DIAGNOSIS — Z743 Need for continuous supervision: Secondary | ICD-10-CM | POA: Diagnosis not present

## 2020-03-26 DIAGNOSIS — F0390 Unspecified dementia without behavioral disturbance: Secondary | ICD-10-CM | POA: Insufficient documentation

## 2020-03-26 DIAGNOSIS — R630 Anorexia: Secondary | ICD-10-CM | POA: Diagnosis not present

## 2020-03-26 DIAGNOSIS — I4891 Unspecified atrial fibrillation: Secondary | ICD-10-CM | POA: Diagnosis not present

## 2020-03-26 DIAGNOSIS — I10 Essential (primary) hypertension: Secondary | ICD-10-CM | POA: Diagnosis not present

## 2020-03-26 DIAGNOSIS — R519 Headache, unspecified: Secondary | ICD-10-CM | POA: Diagnosis not present

## 2020-03-26 LAB — CBC WITH DIFFERENTIAL/PLATELET
Abs Immature Granulocytes: 0.01 10*3/uL (ref 0.00–0.07)
Basophils Absolute: 0 10*3/uL (ref 0.0–0.1)
Basophils Relative: 1 %
Eosinophils Absolute: 0.1 10*3/uL (ref 0.0–0.5)
Eosinophils Relative: 2 %
HCT: 36.5 % — ABNORMAL LOW (ref 39.0–52.0)
Hemoglobin: 11.7 g/dL — ABNORMAL LOW (ref 13.0–17.0)
Immature Granulocytes: 0 %
Lymphocytes Relative: 23 %
Lymphs Abs: 1.7 10*3/uL (ref 0.7–4.0)
MCH: 27 pg (ref 26.0–34.0)
MCHC: 32.1 g/dL (ref 30.0–36.0)
MCV: 84.3 fL (ref 80.0–100.0)
Monocytes Absolute: 0.8 10*3/uL (ref 0.1–1.0)
Monocytes Relative: 11 %
Neutro Abs: 4.6 10*3/uL (ref 1.7–7.7)
Neutrophils Relative %: 63 %
Platelets: 215 10*3/uL (ref 150–400)
RBC: 4.33 MIL/uL (ref 4.22–5.81)
RDW: 14.9 % (ref 11.5–15.5)
WBC: 7.3 10*3/uL (ref 4.0–10.5)
nRBC: 0 % (ref 0.0–0.2)

## 2020-03-26 LAB — COMPREHENSIVE METABOLIC PANEL
ALT: 12 U/L (ref 0–44)
AST: 17 U/L (ref 15–41)
Albumin: 2.7 g/dL — ABNORMAL LOW (ref 3.5–5.0)
Alkaline Phosphatase: 58 U/L (ref 38–126)
Anion gap: 9 (ref 5–15)
BUN: 27 mg/dL — ABNORMAL HIGH (ref 8–23)
CO2: 28 mmol/L (ref 22–32)
Calcium: 9 mg/dL (ref 8.9–10.3)
Chloride: 104 mmol/L (ref 98–111)
Creatinine, Ser: 1.76 mg/dL — ABNORMAL HIGH (ref 0.61–1.24)
GFR calc Af Amer: 37 mL/min — ABNORMAL LOW (ref >60)
GFR calc non Af Amer: 32 mL/min — ABNORMAL LOW (ref >60)
Glucose, Bld: 74 mg/dL (ref 70–99)
Potassium: 3.5 mmol/L (ref 3.5–5.1)
Sodium: 141 mmol/L (ref 135–145)
Total Bilirubin: 0.6 mg/dL (ref 0.3–1.2)
Total Protein: 6.7 g/dL (ref 6.5–8.1)

## 2020-03-26 MED ORDER — APIXABAN 5 MG PO TABS
5.0000 mg | ORAL_TABLET | Freq: Once | ORAL | Status: AC
  Administered 2020-03-26: 5 mg via ORAL
  Filled 2020-03-26: qty 1

## 2020-03-26 MED ORDER — FAMOTIDINE 20 MG PO TABS
20.0000 mg | ORAL_TABLET | Freq: Once | ORAL | Status: AC
  Administered 2020-03-26: 20 mg via ORAL
  Filled 2020-03-26: qty 1

## 2020-03-26 MED ORDER — MEMANTINE HCL 10 MG PO TABS
10.0000 mg | ORAL_TABLET | Freq: Once | ORAL | Status: AC
  Administered 2020-03-26: 10 mg via ORAL
  Filled 2020-03-26: qty 1

## 2020-03-26 MED ORDER — PANTOPRAZOLE SODIUM 40 MG PO TBEC
40.0000 mg | DELAYED_RELEASE_TABLET | Freq: Every day | ORAL | Status: DC
  Administered 2020-03-26: 40 mg via ORAL
  Filled 2020-03-26: qty 1

## 2020-03-26 MED ORDER — SODIUM CHLORIDE 0.9 % IV BOLUS
500.0000 mL | Freq: Once | INTRAVENOUS | Status: AC
  Administered 2020-03-26: 500 mL via INTRAVENOUS

## 2020-03-26 MED ORDER — GABAPENTIN 100 MG PO CAPS
200.0000 mg | ORAL_CAPSULE | Freq: Once | ORAL | Status: AC
  Administered 2020-03-26: 200 mg via ORAL
  Filled 2020-03-26: qty 2

## 2020-03-26 NOTE — ED Provider Notes (Signed)
Emergency Department Provider Note   I have reviewed the triage vital signs and the nursing notes.   HISTORY  Chief Complaint Dementia   HPI Marvin Bishop. is a 86 y.o. male with past medical history reviewed below including dementia presents to the emergency department by EMS after patient did not want to get out of bed today.  The patient's daughter is at bedside who is one of his caregivers.  At baseline, patient has 24/7 care at home.  The patient's daughter states that this morning he seemed like he not to get out of bed and they found his blood sugar to be 40.  He did eat and drink and seemed to feel better afterwards.  He had a fairly unremarkable day but later in the evening, when they would typically be starting their nighttime routine, he did not want to get out of bed to go to the bathroom.  He looked down and shook his head no.  He did not complain of pain.  The patient's daughter rechecked his blood sugar and found it to be greater than 100 and then took his temperature and found it to be 96 F.  She denies any new medications.  He has not had fevers recently.  He has not been complaining of chest discomfort or grabbing at his chest.  He is not had vomiting or diarrhea.  He refused his nighttime meds and so family called EMS.    Patient denies any pain to me. Level 5 caveat: Dementia.    Past Medical History:  Diagnosis Date  . Arthritis   . Asthma    hx of as child  . Complete loss of vision    right eye  . Dementia ([place])   . Diabetes mellitus   . Headache(784.0)    slight  . Hypercholesteremia   . Hypertension   . Insomnia    occasional  . Nephrolithiasis    right UVJ stone  . Peripheral neuropathy    both legs  . PONV (postoperative nausea and vomiting)   . Prostate cancer [facility])     Patient Active Problem List   Diagnosis Date Noted  . Altered mental status, unspecified 11/14/2019  . Pre-ulcerative calluses 04/29/2019  . Hypophosphatemia 05/31/2017   . TIA (transient ischemic attack) 05/27/2017  . Sepsis ([place]) 05/27/2017  . CKD (chronic kidney disease), stage III 05/27/2017  . Influenza 11/08/2016  . Chronic combined systolic and diastolic congestive heart failure ([place]) 11/08/2016  . History of pulmonary embolus (PE) 11/08/2016  . PE (pulmonary embolism) 04/07/2015  . Pulmonary embolism ([place]) 04/07/2015  . Elevated troponin 04/07/2015  . Cough   . Pulmonary emboli ([place])   . Sinus tachycardia 04/01/2015  . Acid reflux 03/07/2015  . Insulin dependent type 2 diabetes mellitus ([place]) 03/07/2015  . Blind painful eye 01/07/2015  . Ileus ([place]) 08/23/2014  . Dementia without behavioral disturbance ([place]) 09/02/2013  . Renal calculus or stone 08/22/2013  . Hypokalemia 08/22/2013  . Hypertension 08/22/2013  . History of insulin dependent diabetes mellitus 08/22/2013  . Dehydration 08/22/2013  . Memory loss 04/23/2013  . Insomnia   . Carpal tunnel syndrome   . Diverticulosis     Past Surgical History:  Procedure Laterality Date  . APPENDECTOMY    . Cornea removed Right 02-26-15  . CYSTOSCOPY W/ URETERAL STENT PLACEMENT Right 10/01/2013   Procedure: cystoscopy right retrograde holmium laser lithotripsy;  Surgeon: Franchot Gallo, MD;  Location: [facility];  Service: Urology;  Laterality:  Right;  holmium laser application  . HEMORROIDECTOMY    . KNEE ARTHROSCOPY Right   . PARS PLANA VITRECTOMY  10/24/2011   Procedure: PARS PLANA VITRECTOMY WITH 23 GAUGE;  Surgeon: Adonis Brook, MD;  Location: [facility];  Service: Ophthalmology;  Laterality: Right;  . TONSILLECTOMY  as teen    Allergies Donepezil, Hydrocodone, Hydrocodone-acetaminophen, and Penicillins  Family History  Problem Relation Age of Onset  . Cancer Mother        uterus  . Heart Problems Father   . Cancer Brother        stomach  . Cancer Brother        throat  . Colon cancer Brother     Social History Social History   Tobacco Use  . Smoking status: Former Smoker     Packs/day: 0.25    Types: Cigarettes    Quit date: 10/15/1966    Years since quitting: 53.4  . Smokeless tobacco: Never Used  Substance Use Topics  . Alcohol use: Yes    Comment: occasional  . Drug use: No    Review of Systems  Level 5 caveat: Dementia.   ____________________________________________   PHYSICAL EXAM:  VITAL SIGNS: ED Triage Vitals  Enc Vitals Group     BP 03/26/20 2026 111/63     Pulse Rate 03/26/20 2026 79     Resp 03/26/20 2026 18     Temp 03/26/20 2026 98.6 F (37 C)     Temp Source 03/26/20 2026 Oral     SpO2 03/26/20 2006 100 %     Weight 03/26/20 2031 205 lb (93 kg)     Height 03/26/20 2031 6\' 1"  (1.854 m)   Constitutional: Alert but pleasantly confused. Well appearing and in no acute distress. Eyes: Conjunctivae are normal.  Head: Atraumatic. Nose: No congestion/rhinnorhea. Mouth/Throat: Mucous membranes are moist. Neck: No stridor.   Cardiovascular: Normal rate, regular rhythm. Good peripheral circulation. Grossly normal heart sounds.   Respiratory: Normal respiratory effort.  No retractions. Lungs CTAB. Gastrointestinal: Soft and nontender. No distention.  Musculoskeletal: No lower extremity tenderness with trace pedal edema. No gross deformities of extremities. Neurologic:  Normal speech and language. No gross focal neurologic deficits are appreciated.  Skin:  Skin is warm, dry and intact. No rash noted.  ____________________________________________   LABS (all labs ordered are listed, but only abnormal results are displayed)  Labs Reviewed  COMPREHENSIVE METABOLIC PANEL - Abnormal; Notable for the following components:      Result Value   BUN 27 (*)    Creatinine, Ser 1.76 (*)    Albumin 2.7 (*)    GFR calc non Af Amer 32 (*)    GFR calc Af Amer 37 (*)    All other components within normal limits  CBC WITH DIFFERENTIAL/PLATELET - Abnormal; Notable for the following components:   Hemoglobin 11.7 (*)    HCT 36.5 (*)    All other  components within normal limits   ____________________________________________  EKG   EKG Interpretation  Date/Time:  Saturday March 26 2020 22:21:27 EDT Ventricular Rate:  73 PR Interval:    QRS Duration: 103 QT Interval:  434 QTC Calculation: 479 R Axis:   -7 Text Interpretation: Sinus rhythm RSR' in V1 or V2, right VCD or RVH Borderline prolonged QT interval No STEMI Confirmed by Nanda Quinton [phone]) on 03/26/2020 10:32:26 PM Also confirmed by Nanda Quinton [phone]), editor [place], [place] [phone])  on 03/27/2020 10:00:29 AM       ____________________________________________  RADIOLOGY  CT head reviewed.  ____________________________________________   PROCEDURES  Procedure(s) performed:   Procedures  None ____________________________________________   INITIAL IMPRESSION / ASSESSMENT AND PLAN / ED COURSE  Pertinent labs & imaging results that were available during my care of the patient were reviewed by me and considered in my medical decision making (see chart for details).   Patient presents to the emergency department with not getting out of bed today and refusing medicines.  He is pleasant and confused here with me.  I do not appreciate any focal neuro deficit.  No outward signs of head trauma.  Patient's vital signs here are normal.  Plan for screening lab work including UA.  I have placed an order for home health to try and work with the patient on PT/OT issues at home.  Have consulted social work as well.  Patient's daughter is okay with patient going home but just wanted to get him checked out to make sure there is not a medical reason for his symptoms.  Labs reviewed. Care transferred to Dr. Stark Jock pending UA.  ____________________________________________  FINAL CLINICAL IMPRESSION(S) / ED DIAGNOSES  Final diagnoses:  Altered mental status, unspecified altered mental status type  History of dementia     MEDICATIONS GIVEN DURING THIS VISIT:  Medications    sodium chloride 0.9 % bolus 500 mL (0 mLs Intravenous Stopped 03/26/20 2355)  apixaban (ELIQUIS) tablet 5 mg (5 mg Oral Given 03/26/20 2240)  famotidine (PEPCID) tablet 20 mg (20 mg Oral Given 03/26/20 2239)  gabapentin (NEURONTIN) capsule 200 mg (200 mg Oral Given 03/26/20 2238)  memantine (NAMENDA) tablet 10 mg (10 mg Oral Given 03/26/20 2238)    Note:  This document was prepared using Dragon voice recognition software and may include unintentional dictation errors.  Nanda Quinton, MD, [facility] Emergency Medicine    Willow Reczek, [facility], MD 03/29/20 1924

## 2020-03-26 NOTE — ED Triage Notes (Signed)
Pt. Transported via [facility] c/o FTT/ progressive dementia. Per EMS, family states, for the past 3 days he has been very argumentative, not taking his meds, and having trouble sleeping.

## 2020-03-26 NOTE — ED Notes (Signed)
Pt. Successfully passed fluid/PO challenge.

## 2020-03-26 NOTE — ED Notes (Signed)
Meal and fluids provided.

## 2020-03-27 NOTE — ED Provider Notes (Signed)
Care assumed from Dr. Laverta Baltimore at shift change.  Patient brought by family for evaluation of altered mental status.  He has a history of dementia, but has been more uncooperative recently.  He has not been taking his medications and his oral intake has decreased.  Patient signed out to me awaiting results of laboratory studies.  These have returned and are essentially unremarkable.  Patient appears well-hydrated and is now resting comfortably.  We had planned to obtain a urine sample, however the urine was apparently discarded after he was catheterized.  He has been having no urinary issues and has no fever or white count.  From what I am told the urine was clear and not foul-smelling.  I doubt UTI.  Patient has been here for nearly 5 hours and is anxious to go home.  I will have them follow-up as needed for any problems.  His vitals are stable and at this point is resting very comfortably.  I feel as though discharge with expectant return is appropriate.   Veryl Speak, MD 03/27/20 [phone]

## 2020-03-27 NOTE — Discharge Instructions (Signed)
Continue medications as previously prescribed.  Return to the emergency department for high fever, difficulty breathing, severe headache, or other new and concerning symptoms.

## 2020-04-04 DIAGNOSIS — N390 Urinary tract infection, site not specified: Secondary | ICD-10-CM | POA: Diagnosis not present

## 2020-04-06 ENCOUNTER — Encounter: Payer: Self-pay | Admitting: Podiatry

## 2020-04-06 ENCOUNTER — Other Ambulatory Visit: Payer: Self-pay

## 2020-04-06 ENCOUNTER — Ambulatory Visit: Payer: Medicare PPO | Admitting: Podiatry

## 2020-04-06 DIAGNOSIS — L608 Other nail disorders: Secondary | ICD-10-CM | POA: Diagnosis not present

## 2020-04-06 DIAGNOSIS — B351 Tinea unguium: Secondary | ICD-10-CM

## 2020-04-06 DIAGNOSIS — M79676 Pain in unspecified toe(s): Secondary | ICD-10-CM | POA: Diagnosis not present

## 2020-04-06 DIAGNOSIS — N183 Chronic kidney disease, stage 3 unspecified: Secondary | ICD-10-CM

## 2020-04-06 DIAGNOSIS — D689 Coagulation defect, unspecified: Secondary | ICD-10-CM

## 2020-04-06 NOTE — Progress Notes (Signed)
This patient returns to my office for at risk foot care.  This patient requires this care by a professional since this patient will be at risk due to having chronic kidney disease, type 2 diabetes.  This patient is unable to cut nails himself since the patient cannot reach his nails.These nails are painful  wearing shoes.  Patient presents to the office in a wheelchair.  Patient is accompanied by his daughter. Patient is taking eliquiss. This patient presents for at risk foot care today.  General Appearance  Alert, conversant and in no acute stress.  Vascular  Dorsalis pedis and posterior tibial  pulses are non  palpable  bilaterally.  Capillary return is within normal limits  bilaterally. Temperature is within normal limits  bilaterally.  Neurologic  Senn-Weinstein monofilament wire test diminished  bilaterally. Muscle power within normal limits bilaterally.  Nails Thick disfigured discolored nails with subungual debris  from hallux to fifth toes bilaterally. No evidence of bacterial infection or drainage bilaterally.  Orthopedic  No limitations of motion  feet .  No crepitus or effusions noted.  No bony pathology or digital deformities noted.  Skin  normotropic skin with no porokeratosis noted bilaterally.  No signs of infections or ulcers noted.     Onychomycosis  Pain in right toes  Pain in left toes  Consent was obtained for treatment procedures.   Mechanical debridement of nails 1-5  bilaterally performed with a nail nipper.  Filed with dremel without incident. No infection or ulcer.     Return office visit   3 months                   Told patient to return for periodic foot care and evaluation due to potential at risk complications.   Gardiner Barefoot DPM

## 2020-04-27 DIAGNOSIS — I10 Essential (primary) hypertension: Secondary | ICD-10-CM | POA: Diagnosis not present

## 2020-04-27 DIAGNOSIS — I5030 Unspecified diastolic (congestive) heart failure: Secondary | ICD-10-CM | POA: Diagnosis not present

## 2020-04-27 DIAGNOSIS — D649 Anemia, unspecified: Secondary | ICD-10-CM | POA: Diagnosis not present

## 2020-04-27 DIAGNOSIS — E11649 Type 2 diabetes mellitus with hypoglycemia without coma: Secondary | ICD-10-CM | POA: Diagnosis not present

## 2020-04-27 DIAGNOSIS — F0390 Unspecified dementia without behavioral disturbance: Secondary | ICD-10-CM | POA: Diagnosis not present

## 2020-04-27 DIAGNOSIS — C61 Malignant neoplasm of prostate: Secondary | ICD-10-CM | POA: Diagnosis not present

## 2020-04-27 DIAGNOSIS — E1122 Type 2 diabetes mellitus with diabetic chronic kidney disease: Secondary | ICD-10-CM | POA: Diagnosis not present

## 2020-04-27 DIAGNOSIS — E119 Type 2 diabetes mellitus without complications: Secondary | ICD-10-CM | POA: Diagnosis not present

## 2020-04-27 DIAGNOSIS — E782 Mixed hyperlipidemia: Secondary | ICD-10-CM | POA: Diagnosis not present

## 2020-04-29 DIAGNOSIS — R410 Disorientation, unspecified: Secondary | ICD-10-CM | POA: Diagnosis not present

## 2020-04-29 DIAGNOSIS — R310 Gross hematuria: Secondary | ICD-10-CM | POA: Diagnosis not present

## 2020-05-02 DIAGNOSIS — R310 Gross hematuria: Secondary | ICD-10-CM | POA: Diagnosis not present

## 2020-05-02 DIAGNOSIS — R410 Disorientation, unspecified: Secondary | ICD-10-CM | POA: Diagnosis not present

## 2020-05-03 DIAGNOSIS — E1065 Type 1 diabetes mellitus with hyperglycemia: Secondary | ICD-10-CM | POA: Diagnosis not present

## 2020-05-09 DIAGNOSIS — Z7401 Bed confinement status: Secondary | ICD-10-CM | POA: Diagnosis not present

## 2020-05-09 DIAGNOSIS — N401 Enlarged prostate with lower urinary tract symptoms: Secondary | ICD-10-CM | POA: Diagnosis not present

## 2020-05-09 DIAGNOSIS — N400 Enlarged prostate without lower urinary tract symptoms: Secondary | ICD-10-CM | POA: Diagnosis not present

## 2020-05-09 DIAGNOSIS — R6889 Other general symptoms and signs: Secondary | ICD-10-CM | POA: Diagnosis not present

## 2020-05-09 DIAGNOSIS — Z743 Need for continuous supervision: Secondary | ICD-10-CM | POA: Diagnosis not present

## 2020-05-09 DIAGNOSIS — N390 Urinary tract infection, site not specified: Secondary | ICD-10-CM | POA: Diagnosis not present

## 2020-05-09 DIAGNOSIS — M2550 Pain in unspecified joint: Secondary | ICD-10-CM | POA: Diagnosis not present

## 2020-05-09 DIAGNOSIS — C61 Malignant neoplasm of prostate: Secondary | ICD-10-CM | POA: Diagnosis not present

## 2020-05-17 DIAGNOSIS — I700 Atherosclerosis of aorta: Secondary | ICD-10-CM | POA: Diagnosis not present

## 2020-05-17 DIAGNOSIS — E1122 Type 2 diabetes mellitus with diabetic chronic kidney disease: Secondary | ICD-10-CM | POA: Diagnosis not present

## 2020-05-17 DIAGNOSIS — E11649 Type 2 diabetes mellitus with hypoglycemia without coma: Secondary | ICD-10-CM | POA: Diagnosis not present

## 2020-05-17 DIAGNOSIS — E1165 Type 2 diabetes mellitus with hyperglycemia: Secondary | ICD-10-CM | POA: Diagnosis not present

## 2020-05-17 DIAGNOSIS — I10 Essential (primary) hypertension: Secondary | ICD-10-CM | POA: Diagnosis not present

## 2020-05-17 DIAGNOSIS — D649 Anemia, unspecified: Secondary | ICD-10-CM | POA: Diagnosis not present

## 2020-05-17 DIAGNOSIS — E1140 Type 2 diabetes mellitus with diabetic neuropathy, unspecified: Secondary | ICD-10-CM | POA: Diagnosis not present

## 2020-05-17 DIAGNOSIS — C61 Malignant neoplasm of prostate: Secondary | ICD-10-CM | POA: Diagnosis not present

## 2020-05-17 DIAGNOSIS — E782 Mixed hyperlipidemia: Secondary | ICD-10-CM | POA: Diagnosis not present

## 2020-05-17 DIAGNOSIS — G629 Polyneuropathy, unspecified: Secondary | ICD-10-CM | POA: Diagnosis not present

## 2020-05-17 DIAGNOSIS — I2699 Other pulmonary embolism without acute cor pulmonale: Secondary | ICD-10-CM | POA: Diagnosis not present

## 2020-05-17 DIAGNOSIS — I5030 Unspecified diastolic (congestive) heart failure: Secondary | ICD-10-CM | POA: Diagnosis not present

## 2020-05-17 DIAGNOSIS — F0390 Unspecified dementia without behavioral disturbance: Secondary | ICD-10-CM | POA: Diagnosis not present

## 2020-06-14 DIAGNOSIS — G8929 Other chronic pain: Secondary | ICD-10-CM | POA: Diagnosis not present

## 2020-06-14 DIAGNOSIS — Z803 Family history of malignant neoplasm of breast: Secondary | ICD-10-CM | POA: Diagnosis not present

## 2020-06-14 DIAGNOSIS — Z7982 Long term (current) use of aspirin: Secondary | ICD-10-CM | POA: Diagnosis not present

## 2020-06-14 DIAGNOSIS — R32 Unspecified urinary incontinence: Secondary | ICD-10-CM | POA: Diagnosis not present

## 2020-06-14 DIAGNOSIS — Z7401 Bed confinement status: Secondary | ICD-10-CM | POA: Diagnosis not present

## 2020-06-14 DIAGNOSIS — K219 Gastro-esophageal reflux disease without esophagitis: Secondary | ICD-10-CM | POA: Diagnosis not present

## 2020-06-14 DIAGNOSIS — Z8546 Personal history of malignant neoplasm of prostate: Secondary | ICD-10-CM | POA: Diagnosis not present

## 2020-06-14 DIAGNOSIS — E1142 Type 2 diabetes mellitus with diabetic polyneuropathy: Secondary | ICD-10-CM | POA: Diagnosis not present

## 2020-06-14 DIAGNOSIS — Z87891 Personal history of nicotine dependence: Secondary | ICD-10-CM | POA: Diagnosis not present

## 2020-06-14 DIAGNOSIS — Z794 Long term (current) use of insulin: Secondary | ICD-10-CM | POA: Diagnosis not present

## 2020-06-14 DIAGNOSIS — F0390 Unspecified dementia without behavioral disturbance: Secondary | ICD-10-CM | POA: Diagnosis not present

## 2020-06-14 DIAGNOSIS — J309 Allergic rhinitis, unspecified: Secondary | ICD-10-CM | POA: Diagnosis not present

## 2020-06-14 DIAGNOSIS — I509 Heart failure, unspecified: Secondary | ICD-10-CM | POA: Diagnosis not present

## 2020-06-14 DIAGNOSIS — Z8249 Family history of ischemic heart disease and other diseases of the circulatory system: Secondary | ICD-10-CM | POA: Diagnosis not present

## 2020-06-14 DIAGNOSIS — E785 Hyperlipidemia, unspecified: Secondary | ICD-10-CM | POA: Diagnosis not present

## 2020-06-14 DIAGNOSIS — E1151 Type 2 diabetes mellitus with diabetic peripheral angiopathy without gangrene: Secondary | ICD-10-CM | POA: Diagnosis not present

## 2020-06-21 ENCOUNTER — Inpatient Hospital Stay (HOSPITAL_COMMUNITY)
Admission: EM | Admit: 2020-06-21 | Discharge: 2020-06-26 | DRG: 871 | Disposition: A | Discharge status: Hospice | Attending: Internal Medicine | Admitting: Internal Medicine

## 2020-06-21 ENCOUNTER — Emergency Department (HOSPITAL_COMMUNITY)

## 2020-06-21 ENCOUNTER — Other Ambulatory Visit: Payer: Self-pay

## 2020-06-21 ENCOUNTER — Encounter (HOSPITAL_COMMUNITY): Payer: Self-pay

## 2020-06-21 DIAGNOSIS — E1169 Type 2 diabetes mellitus with other specified complication: Secondary | ICD-10-CM | POA: Diagnosis present

## 2020-06-21 DIAGNOSIS — Z86711 Personal history of pulmonary embolism: Secondary | ICD-10-CM

## 2020-06-21 DIAGNOSIS — R6520 Severe sepsis without septic shock: Secondary | ICD-10-CM

## 2020-06-21 DIAGNOSIS — Z7982 Long term (current) use of aspirin: Secondary | ICD-10-CM | POA: Diagnosis not present

## 2020-06-21 DIAGNOSIS — A411 Sepsis due to other specified staphylococcus: Principal | ICD-10-CM | POA: Diagnosis present

## 2020-06-21 DIAGNOSIS — E1122 Type 2 diabetes mellitus with diabetic chronic kidney disease: Secondary | ICD-10-CM | POA: Diagnosis present

## 2020-06-21 DIAGNOSIS — Z66 Do not resuscitate: Secondary | ICD-10-CM | POA: Diagnosis present

## 2020-06-21 DIAGNOSIS — R000 Tachycardia, unspecified: Secondary | ICD-10-CM | POA: Diagnosis not present

## 2020-06-21 DIAGNOSIS — K5641 Fecal impaction: Secondary | ICD-10-CM | POA: Diagnosis present

## 2020-06-21 DIAGNOSIS — R195 Other fecal abnormalities: Secondary | ICD-10-CM | POA: Diagnosis not present

## 2020-06-21 DIAGNOSIS — Z7189 Other specified counseling: Secondary | ICD-10-CM

## 2020-06-21 DIAGNOSIS — E872 Acidosis: Secondary | ICD-10-CM | POA: Diagnosis present

## 2020-06-21 DIAGNOSIS — F0390 Unspecified dementia without behavioral disturbance: Secondary | ICD-10-CM | POA: Diagnosis present

## 2020-06-21 DIAGNOSIS — E876 Hypokalemia: Secondary | ICD-10-CM | POA: Diagnosis present

## 2020-06-21 DIAGNOSIS — N281 Cyst of kidney, acquired: Secondary | ICD-10-CM | POA: Diagnosis not present

## 2020-06-21 DIAGNOSIS — E119 Type 2 diabetes mellitus without complications: Secondary | ICD-10-CM

## 2020-06-21 DIAGNOSIS — I959 Hypotension, unspecified: Secondary | ICD-10-CM | POA: Diagnosis not present

## 2020-06-21 DIAGNOSIS — R1111 Vomiting without nausea: Secondary | ICD-10-CM | POA: Diagnosis not present

## 2020-06-21 DIAGNOSIS — Z8546 Personal history of malignant neoplasm of prostate: Secondary | ICD-10-CM

## 2020-06-21 DIAGNOSIS — E785 Hyperlipidemia, unspecified: Secondary | ICD-10-CM | POA: Diagnosis present

## 2020-06-21 DIAGNOSIS — Z7401 Bed confinement status: Secondary | ICD-10-CM | POA: Diagnosis not present

## 2020-06-21 DIAGNOSIS — Z87891 Personal history of nicotine dependence: Secondary | ICD-10-CM

## 2020-06-21 DIAGNOSIS — E7800 Pure hypercholesterolemia, unspecified: Secondary | ICD-10-CM | POA: Diagnosis present

## 2020-06-21 DIAGNOSIS — E1140 Type 2 diabetes mellitus with diabetic neuropathy, unspecified: Secondary | ICD-10-CM | POA: Diagnosis present

## 2020-06-21 DIAGNOSIS — A419 Sepsis, unspecified organism: Secondary | ICD-10-CM | POA: Diagnosis not present

## 2020-06-21 DIAGNOSIS — I130 Hypertensive heart and chronic kidney disease with heart failure and stage 1 through stage 4 chronic kidney disease, or unspecified chronic kidney disease: Secondary | ICD-10-CM | POA: Diagnosis present

## 2020-06-21 DIAGNOSIS — I5032 Chronic diastolic (congestive) heart failure: Secondary | ICD-10-CM | POA: Diagnosis present

## 2020-06-21 DIAGNOSIS — Z515 Encounter for palliative care: Secondary | ICD-10-CM | POA: Diagnosis present

## 2020-06-21 DIAGNOSIS — Z20822 Contact with and (suspected) exposure to covid-19: Secondary | ICD-10-CM | POA: Diagnosis present

## 2020-06-21 DIAGNOSIS — R404 Transient alteration of awareness: Secondary | ICD-10-CM | POA: Diagnosis not present

## 2020-06-21 DIAGNOSIS — R6521 Severe sepsis with septic shock: Secondary | ICD-10-CM | POA: Diagnosis not present

## 2020-06-21 DIAGNOSIS — N390 Urinary tract infection, site not specified: Secondary | ICD-10-CM | POA: Diagnosis present

## 2020-06-21 DIAGNOSIS — N179 Acute kidney failure, unspecified: Secondary | ICD-10-CM | POA: Diagnosis present

## 2020-06-21 DIAGNOSIS — G9341 Metabolic encephalopathy: Secondary | ICD-10-CM | POA: Diagnosis present

## 2020-06-21 DIAGNOSIS — R338 Other retention of urine: Secondary | ICD-10-CM | POA: Diagnosis present

## 2020-06-21 DIAGNOSIS — N1832 Chronic kidney disease, stage 3b: Secondary | ICD-10-CM | POA: Diagnosis present

## 2020-06-21 DIAGNOSIS — F05 Delirium due to known physiological condition: Secondary | ICD-10-CM | POA: Diagnosis present

## 2020-06-21 DIAGNOSIS — E1165 Type 2 diabetes mellitus with hyperglycemia: Secondary | ICD-10-CM | POA: Diagnosis present

## 2020-06-21 DIAGNOSIS — K8020 Calculus of gallbladder without cholecystitis without obstruction: Secondary | ICD-10-CM | POA: Diagnosis not present

## 2020-06-21 DIAGNOSIS — M2550 Pain in unspecified joint: Secondary | ICD-10-CM | POA: Diagnosis not present

## 2020-06-21 DIAGNOSIS — Z743 Need for continuous supervision: Secondary | ICD-10-CM | POA: Diagnosis not present

## 2020-06-21 DIAGNOSIS — R197 Diarrhea, unspecified: Secondary | ICD-10-CM | POA: Diagnosis not present

## 2020-06-21 DIAGNOSIS — R531 Weakness: Secondary | ICD-10-CM | POA: Diagnosis not present

## 2020-06-21 DIAGNOSIS — Z7901 Long term (current) use of anticoagulants: Secondary | ICD-10-CM

## 2020-06-21 DIAGNOSIS — N3289 Other specified disorders of bladder: Secondary | ICD-10-CM | POA: Diagnosis not present

## 2020-06-21 DIAGNOSIS — K5900 Constipation, unspecified: Secondary | ICD-10-CM

## 2020-06-21 DIAGNOSIS — Z794 Long term (current) use of insulin: Secondary | ICD-10-CM | POA: Diagnosis not present

## 2020-06-21 DIAGNOSIS — N3000 Acute cystitis without hematuria: Secondary | ICD-10-CM | POA: Diagnosis present

## 2020-06-21 DIAGNOSIS — I10 Essential (primary) hypertension: Secondary | ICD-10-CM | POA: Diagnosis not present

## 2020-06-21 DIAGNOSIS — I152 Hypertension secondary to endocrine disorders: Secondary | ICD-10-CM | POA: Diagnosis present

## 2020-06-21 DIAGNOSIS — J90 Pleural effusion, not elsewhere classified: Secondary | ICD-10-CM | POA: Diagnosis not present

## 2020-06-21 DIAGNOSIS — R339 Retention of urine, unspecified: Secondary | ICD-10-CM | POA: Diagnosis present

## 2020-06-21 DIAGNOSIS — Z9079 Acquired absence of other genital organ(s): Secondary | ICD-10-CM

## 2020-06-21 LAB — URINALYSIS, ROUTINE W REFLEX MICROSCOPIC
Bilirubin Urine: NEGATIVE
Glucose, UA: NEGATIVE mg/dL
Ketones, ur: 5 mg/dL — AB
Nitrite: NEGATIVE
Protein, ur: NEGATIVE mg/dL
Specific Gravity, Urine: 1.01 (ref 1.005–1.030)
WBC, UA: >50 WBC/hpf — ABNORMAL HIGH (ref 0–5)
pH: 5 (ref 5.0–8.0)

## 2020-06-21 LAB — PROTIME-INR
INR: 1.4 — ABNORMAL HIGH (ref 0.8–1.2)
Prothrombin Time: 16.2 seconds — ABNORMAL HIGH (ref 11.4–15.2)

## 2020-06-21 LAB — SARS CORONAVIRUS 2 BY RT PCR (HOSPITAL ORDER, PERFORMED IN ~~LOC~~ HOSPITAL LAB): SARS Coronavirus 2: NEGATIVE

## 2020-06-21 LAB — TROPONIN I (HIGH SENSITIVITY)
Troponin I (High Sensitivity): 71 ng/L — ABNORMAL HIGH (ref <18)
Troponin I (High Sensitivity): 76 ng/L — ABNORMAL HIGH (ref <18)

## 2020-06-21 LAB — CBC WITH DIFFERENTIAL/PLATELET
Abs Immature Granulocytes: 0.07 10*3/uL (ref 0.00–0.07)
Basophils Absolute: 0 10*3/uL (ref 0.0–0.1)
Basophils Relative: 0 %
Eosinophils Absolute: 0 10*3/uL (ref 0.0–0.5)
Eosinophils Relative: 0 %
HCT: 32.8 % — ABNORMAL LOW (ref 39.0–52.0)
Hemoglobin: 10.4 g/dL — ABNORMAL LOW (ref 13.0–17.0)
Immature Granulocytes: 0 %
Lymphocytes Relative: 3 %
Lymphs Abs: 0.6 10*3/uL — ABNORMAL LOW (ref 0.7–4.0)
MCH: 26.3 pg (ref 26.0–34.0)
MCHC: 31.7 g/dL (ref 30.0–36.0)
MCV: 82.8 fL (ref 80.0–100.0)
Monocytes Absolute: 1.6 10*3/uL — ABNORMAL HIGH (ref 0.1–1.0)
Monocytes Relative: 9 %
Neutro Abs: 15.9 10*3/uL — ABNORMAL HIGH (ref 1.7–7.7)
Neutrophils Relative %: 88 %
Platelets: 229 10*3/uL (ref 150–400)
RBC: 3.96 MIL/uL — ABNORMAL LOW (ref 4.22–5.81)
RDW: 15.1 % (ref 11.5–15.5)
WBC: 18.2 10*3/uL — ABNORMAL HIGH (ref 4.0–10.5)
nRBC: 0 % (ref 0.0–0.2)

## 2020-06-21 LAB — COMPREHENSIVE METABOLIC PANEL
ALT: 11 U/L (ref 0–44)
AST: 16 U/L (ref 15–41)
Albumin: 2.7 g/dL — ABNORMAL LOW (ref 3.5–5.0)
Alkaline Phosphatase: 56 U/L (ref 38–126)
Anion gap: 18 — ABNORMAL HIGH (ref 5–15)
BUN: 49 mg/dL — ABNORMAL HIGH (ref 8–23)
CO2: 24 mmol/L (ref 22–32)
Calcium: 8.6 mg/dL — ABNORMAL LOW (ref 8.9–10.3)
Chloride: 102 mmol/L (ref 98–111)
Creatinine, Ser: 3.14 mg/dL — ABNORMAL HIGH (ref 0.61–1.24)
GFR calc Af Amer: 18 mL/min — ABNORMAL LOW (ref >60)
GFR calc non Af Amer: 16 mL/min — ABNORMAL LOW (ref >60)
Glucose, Bld: 202 mg/dL — ABNORMAL HIGH (ref 70–99)
Potassium: 3.5 mmol/L (ref 3.5–5.1)
Sodium: 144 mmol/L (ref 135–145)
Total Bilirubin: 1.2 mg/dL (ref 0.3–1.2)
Total Protein: 6.7 g/dL (ref 6.5–8.1)

## 2020-06-21 LAB — PROCALCITONIN: Procalcitonin: 1.22 ng/mL

## 2020-06-21 LAB — APTT: aPTT: 34 seconds (ref 24–36)

## 2020-06-21 LAB — LACTIC ACID, PLASMA
Lactic Acid, Venous: 1.8 mmol/L (ref 0.5–1.9)
Lactic Acid, Venous: 5.6 mmol/L (ref 0.5–1.9)
Lactic Acid, Venous: 4.9 mmol/L (ref 0.5–1.9)

## 2020-06-21 LAB — CBG MONITORING, ED: Glucose-Capillary: 195 mg/dL — ABNORMAL HIGH (ref 70–99)

## 2020-06-21 MED ORDER — LACTATED RINGERS IV SOLN: INTRAVENOUS | Status: AC

## 2020-06-21 MED ORDER — INSULIN ASPART 100 UNIT/ML ~~LOC~~ SOLN
0.0000 [IU] | SUBCUTANEOUS | Status: DC
  Administered 2020-06-21: 2 [IU] via SUBCUTANEOUS
  Administered 2020-06-22: 1 [IU] via SUBCUTANEOUS
  Administered 2020-06-22 – 2020-06-24 (×5): 2 [IU] via SUBCUTANEOUS
  Administered 2020-06-24 (×3): 1 [IU] via SUBCUTANEOUS
  Administered 2020-06-25: 2 [IU] via SUBCUTANEOUS
  Filled 2020-06-21: qty 0.09

## 2020-06-21 MED ORDER — SODIUM CHLORIDE 0.9 % IV SOLN
2.0000 g | Freq: Once | INTRAVENOUS | Status: AC
  Administered 2020-06-21: 2 g via INTRAVENOUS
  Filled 2020-06-21: qty 2

## 2020-06-21 MED ORDER — ACETAMINOPHEN 650 MG RE SUPP: 650.0000 mg | Freq: Four times a day (QID) | RECTAL | Status: DC | PRN

## 2020-06-21 MED ORDER — LACTATED RINGERS IV BOLUS (SEPSIS)
1000.0000 mL | Freq: Once | INTRAVENOUS | Status: AC
  Administered 2020-06-21 (×2): 1000 mL via INTRAVENOUS

## 2020-06-21 MED ORDER — BISACODYL 10 MG RE SUPP
10.0000 mg | Freq: Once | RECTAL | Status: AC
  Administered 2020-06-21: 10 mg via RECTAL
  Filled 2020-06-21: qty 1

## 2020-06-21 MED ORDER — LACTATED RINGERS IV BOLUS
1000.0000 mL | Freq: Once | INTRAVENOUS | Status: AC
  Administered 2020-06-21: 1000 mL via INTRAVENOUS

## 2020-06-21 MED ORDER — ONDANSETRON HCL 4 MG/2ML IJ SOLN
4.0000 mg | Freq: Four times a day (QID) | INTRAMUSCULAR | Status: DC | PRN
  Administered 2020-06-23: 4 mg via INTRAVENOUS
  Filled 2020-06-21 (×2): qty 2

## 2020-06-21 MED ORDER — LACTATED RINGERS IV SOLN: INTRAVENOUS | Status: DC

## 2020-06-21 MED ORDER — LACTATED RINGERS IV BOLUS (SEPSIS)
1000.0000 mL | Freq: Once | INTRAVENOUS | Status: AC
  Administered 2020-06-21: 1000 mL via INTRAVENOUS

## 2020-06-21 MED ORDER — ONDANSETRON HCL 4 MG PO TABS: 4.0000 mg | ORAL_TABLET | Freq: Four times a day (QID) | ORAL | Status: DC | PRN

## 2020-06-21 MED ORDER — POLYETHYLENE GLYCOL 3350 17 G PO PACK
17.0000 g | PACK | Freq: Every day | ORAL | Status: DC | PRN
  Administered 2020-06-23: 17 g via ORAL
  Filled 2020-06-21: qty 1

## 2020-06-21 MED ORDER — METRONIDAZOLE IN NACL 5-0.79 MG/ML-% IV SOLN
500.0000 mg | Freq: Once | INTRAVENOUS | Status: AC
  Administered 2020-06-21: 500 mg via INTRAVENOUS
  Filled 2020-06-21: qty 100

## 2020-06-21 MED ORDER — SODIUM CHLORIDE 0.9% FLUSH
3.0000 mL | Freq: Two times a day (BID) | INTRAVENOUS | Status: DC
  Administered 2020-06-22 – 2020-06-26 (×5): 3 mL via INTRAVENOUS

## 2020-06-21 MED ORDER — ACETAMINOPHEN 325 MG PO TABS
650.0000 mg | ORAL_TABLET | Freq: Four times a day (QID) | ORAL | Status: DC | PRN
  Administered 2020-06-22 – 2020-06-23 (×2): 650 mg via ORAL
  Filled 2020-06-21 (×3): qty 2

## 2020-06-21 MED ORDER — SODIUM CHLORIDE 0.9 % IV SOLN
1.0000 g | INTRAVENOUS | Status: DC
  Administered 2020-06-21 – 2020-06-24 (×4): 1 g via INTRAVENOUS
  Filled 2020-06-21: qty 10
  Filled 2020-06-21 (×2): qty 1
  Filled 2020-06-21: qty 10

## 2020-06-21 MED ORDER — HEPARIN SODIUM (PORCINE) 5000 UNIT/ML IJ SOLN
5000.0000 [IU] | Freq: Three times a day (TID) | INTRAMUSCULAR | Status: DC
  Administered 2020-06-21 – 2020-06-26 (×12): 5000 [IU] via SUBCUTANEOUS
  Filled 2020-06-21 (×13): qty 1

## 2020-06-21 NOTE — Progress Notes (Signed)
A consult was received from an ED physician for Cefepime per pharmacy dosing.  The patient's profile has been reviewed for ht/wt/allergies/indication/available labs.  Noted penicillin allergy, but patient has tolerated several cephalosporins in the past.   A one time order has already been placed by MD for Cefepime 2g IV.  Further antibiotics/pharmacy consults should be ordered by admitting physician if indicated.                       Thank you, Luiz Ochoa 06/21/2020  3:34 PM

## 2020-06-21 NOTE — ED Notes (Signed)
Date and time results received: 06/21/20 7:24 PM  (use smartphrase ".now" to insert current time)  Test: Lactic Acid Critical Value: 5.6  Name of Provider Notified: Dr.J Tomi Bamberger  Orders Received? Or Actions Taken?:

## 2020-06-21 NOTE — ED Triage Notes (Signed)
Pt BIBA from home. Pt started having N/V/D/ weakness starting today with EMS. Family is trying to get in contact with hospice. Hx of dementia. Pt can answer yes/no, tactile stimulation will get pt's attention.  18 L AC 350 NS NSR w/ occ PVC 120 HR

## 2020-06-21 NOTE — Progress Notes (Signed)
Manufacturing engineer [facility]) Community Based Palliative Care       This patient is enrolled in our hospice program.  Report exchanged with the homecare RN who reports arriving to the home to learn EMS had been activated.    [facility] will continue  to follow for any discharge planning needs.  If you have questions or need assistance, please call  contact the hospital Liaison listed on AMION.     Thank you for the opportunity to participate in this patient's care.     Domenic Moras, BSN, RN [facility] Liaison   [phone] (24h on call)

## 2020-06-21 NOTE — H&P (Signed)
History and Physical    Nathan Moctezuma KDX:[id number] DOB: 23-Apr-1920 DOA: 06/21/2020  PCP: [name], MD  Patient coming from: Home  I have personally briefly reviewed patient's old medical records in [place]  Chief Complaint: Altered mental status, nausea, vomiting  HPI: TAMIR WALLMAN Sr. is a 86 y.o. male with medical history significant for dementia, insulin-dependent type 2 diabetes, history of PE on ASA 325 mg, hypertension, hyperlipidemia, CKD stage III, and prostate cancer who presents to the ED for evaluation of acute encephalopathy, nausea, vomiting, diarrhea.  History is limited from patient due to dementia and encephalopathy therefore majority of history is obtained from EDP, chart review, and daughter by phone.  Daughter states that patient is on complete bedrest at his baseline.  He lives at home with family and has daily aides come to the house.  2 days ago he had notable decreased urine output than expected with foul odor to his urine.  Urine output apparently improved the following day however earlier today he had an episode of chest pain followed by abdominal pain.  He developed nausea, vomiting, and several episodes of diarrhea.  He was brought to the ED for further evaluation.  ED Course:  Initial vitals showed BP 112/61, pulse 117, RR 17, temp 97.9 Fahrenheit, SPO2 96% on room air.  While in the ED SBP is as low as 83 with map 62, RR up to 28.  Labs show BUN 49, creatinine 3.14 (previously 1.76 on 03/26/2020), sodium 144, potassium 3.5, bicarb 24, serum glucose 202, LFTs within normal limits, WBC 18.2, hemoglobin 10.4, platelets 229,000, lactic acid 1.8 > 5.6, high-sensitivity troponin I 71 and 76.  Urinalysis showed negative nitrates, large leukocytes, 6-10 RBC/hpf, >50 WBC/hpf, many bacteria microscopy.  Blood and urine cultures were obtained and pending.  SARS-CoV-2 PCR is negative.  Abdominal x-ray showed stool burden suggestive of fecal impaction.   Portable chest x-ray was negative for focal consolidation, edema, or effusion.  CT abdomen/pelvis without contrast showed a severely distended urinary bladder with mild to moderate bilateral collecting system dilatation to the level of the urinary bladder.  Large amount of stool throughout the colon especially at the level of the rectum is seen.  Cholelithiasis without acute inflammation also noted.  Patient was given 4 L LR and placed on maintenance fluids.  Empiric antibiotics with cefepime and Flagyl were started.  The hospitalist service was consulted to admit for further evaluation management.  Review of Systems:  Unable to obtain full review of systems due to dementia and encephalopathy.   Past Medical History:  Diagnosis Date  . Arthritis   . Asthma    hx of as child  . Complete loss of vision    right eye  . Dementia ([place])   . Diabetes mellitus   . Headache(784.0)    slight  . Hypercholesteremia   . Hypertension   . Insomnia    occasional  . Nephrolithiasis    right UVJ stone  . Peripheral neuropathy    both legs  . PONV (postoperative nausea and vomiting)   . Prostate cancer [facility])     Past Surgical History:  Procedure Laterality Date  . APPENDECTOMY    . Cornea removed Right 02-26-15  . CYSTOSCOPY W/ URETERAL STENT PLACEMENT Right 10/01/2013   Procedure: cystoscopy right retrograde holmium laser lithotripsy;  Surgeon: Franchot Gallo, MD;  Location: [facility];  Service: Urology;  Laterality: Right;  holmium laser application  . HEMORROIDECTOMY    .  KNEE ARTHROSCOPY Right   . PARS PLANA VITRECTOMY  10/24/2011   Procedure: PARS PLANA VITRECTOMY WITH 23 GAUGE;  Surgeon: Adonis Brook, MD;  Location: [place];  Service: Ophthalmology;  Laterality: Right;  . TONSILLECTOMY  as teen    Social History:  reports that he quit smoking about 53 years ago. His smoking use included cigarettes. He smoked 0.25 packs per day. He has never used smokeless tobacco. He reports current  alcohol use. He reports that he does not use drugs.  Allergies  Allergen Reactions  . Donepezil Nausea And Vomiting  . Hydrocodone Nausea And Vomiting  . Hydrocodone-Acetaminophen Nausea Only  . Penicillins Hives and Swelling    Did it involve swelling of the face/tongue/throat, SOB, or low BP? No Did it involve sudden or severe rash/hives, skin peeling, or any reaction on the inside of your mouth or nose? Yes Did you need to seek medical attention at a hospital or doctor's office? Yes When did it last happen?per daughter If all above answers are "NO", may proceed with cephalosporin use.     Family History  Problem Relation Age of Onset  . Cancer Mother        uterus  . Heart Problems Father   . Cancer Brother        stomach  . Cancer Brother        throat  . Colon cancer Brother      Prior to Admission medications   Medication Sig Start Date End Date Taking? Authorizing Provider  acetaminophen (TYLENOL) 500 MG tablet Take 1,000 mg by mouth every 6 (six) hours as needed for moderate pain.    [provider]  apixaban (ELIQUIS) 2.5 MG TABS tablet Take 2.5 mg by mouth 2 (two) times daily.    [provider]  apixaban (ELIQUIS) 5 MG TABS tablet Take 1 tablet (5 mg total) by mouth 2 (two) times daily. 05/31/17   Sheikh, Georgina Quint Latif, DO  BD PEN NEEDLE NANO U/F 32G X 4 MM MISC See admin instructions. 07/13/15   [provider]  docusate sodium 100 MG CAPS Take 200 mg by mouth 2 (two) times daily as needed for mild constipation. Patient taking differently: Take 200 mg by mouth daily as needed (constipation).  08/25/14   Thurnell Lose, MD  erythromycin ophthalmic ointment Place 1 application into the right eye at bedtime. 05/03/17   [provider]  esomeprazole (NEXIUM) 40 MG capsule Take 1 capsule (40 mg total) by mouth 2 (two) times daily before a meal. 04/04/15   Barton Dubois, MD  famotidine (PEPCID) 20 MG tablet Take 1 tablet (20 mg  total) by mouth at bedtime. 04/04/15   Barton Dubois, MD  Fiber Select Gummies CHEW Chew 1 tablet by mouth 2 (two) times daily.    [provider]  furosemide (LASIX) 40 MG tablet Take 40 mg by mouth See admin instructions. Take one tablet (40 mg) by mouth on Monday, Wednesday, Friday morning, take one tablet (40 mg) daily at noon    [provider]  gabapentin (NEURONTIN) 100 MG capsule Take 200 mg by mouth at bedtime.  09/21/15   [provider]  glipiZIDE (GLUCOTROL XL) 5 MG 24 hr tablet Take 5 mg by mouth daily. 02/11/20   [provider]  hydrocortisone (ANUSOL-HC) 2.5 % rectal cream  03/15/20   [provider]  insulin detemir (LEVEMIR FLEXTOUCH) 100 UNIT/ML FlexPen Inject 28 Units into the skin daily before breakfast.    [provider]  insulin lispro (HUMALOG KWIKPEN) 100 UNIT/ML KwikPen Inject 3-15 Units into the skin 3 (three) times daily as needed (CBG >150). Per sliding scale    [provider]  Loratadine (CLARITIN) 10 MG CAPS Take 10 mg by mouth daily after breakfast.     [provider]  magnesium oxide (MAG-OX) 400 MG tablet Take 400 mg by mouth 2 (two) times daily.    [provider]  memantine (NAMENDA) 10 MG tablet Take 1 tablet (10 mg total) by mouth 2 (two) times daily. Please call [phone] to schedule yearly appt. 04/16/17   Marcial Pacas, MD  Multiple Vitamins-Minerals (ELDERTONIC PO) Take 15 mLs by mouth 2 (two) times daily.    [provider]  Naphazoline HCl (CLEAR EYES OP) Place 1 drop into the left eye at bedtime.    [provider]  nitrofurantoin, macrocrystal-monohydrate, (MACROBID) 100 MG capsule Take 100 mg by mouth 2 (two) times daily. 02/05/20   [provider]  ONE TOUCH ULTRA TEST test strip USE TO TEST TWICE A DAY 25 07/03/18   [provider]  polyethylene glycol (MIRALAX / GLYCOLAX) 17 g packet Take 17 g by mouth daily as needed (constipation).    [provider]  simvastatin (ZOCOR) 40 MG tablet Take 40 mg by mouth every morning.     [provider]  traMADol (ULTRAM) 50 MG tablet Take by mouth daily as needed (pain).    [provider]    Physical Exam: Vitals:   06/21/20 1815 06/21/20 1830 06/21/20 1845 06/21/20 1900  BP: (!) 94/59 113/65 120/64 (!) 102/59  Pulse: 97 96 97 97  Resp: 17 18 16 19   Temp:      TempSrc:      SpO2: 99% 99% 100% 98%  Exam limited due to excessive somnolence. Constitutional: Elderly man resting supine in bed with head slightly elevated, very somnolent, NAD, calm, comfortable Eyes: PERRL, lids and conjunctivae normal ENMT: Mucous membranes are dry. Posterior pharynx clear of any exudate or lesions. Neck: normal, supple, no masses. Respiratory: clear to auscultation anteriorly. Normal respiratory effort. No accessory muscle use.  Cardiovascular: Regular rate and rhythm, no murmurs / rubs / gallops. No extremity edema. 2+ pedal pulses. Abdomen: no tenderness, no masses palpated. No hepatosplenomegaly. Bowel sounds positive.  GU: Foley catheter in place with yellow urine in collecting bag. Musculoskeletal: no clubbing / cyanosis. No joint deformity upper and lower extremities. Normal muscle tone.  Skin: no rashes, lesions, ulcers. No induration Neurologic: Limited due to mental status.  Sensation intact. Psychiatric: Unable to assess due to excessive somnolence.   Labs on Admission: I have personally reviewed following labs and imaging studies  CBC: Recent Labs  Lab 06/21/20 1547  WBC 18.2*  NEUTROABS 15.9*  HGB 10.4*  HCT 32.8*  MCV 82.8  PLT 242   Basic Metabolic Panel: Recent Labs  Lab 06/21/20 1547  NA 144  K 3.5  CL 102  CO2 24  GLUCOSE 202*  BUN 49*  CREATININE 3.14*  CALCIUM 8.6*   GFR: CrCl cannot be calculated (Unknown ideal weight.). Liver Function Tests: Recent Labs  Lab 06/21/20 1547  AST 16  ALT 11  ALKPHOS 56  BILITOT 1.2  PROT 6.7    ALBUMIN 2.7*   No results for input(s): LIPASE, AMYLASE in the last 168 hours. No results for input(s): AMMONIA in the last 168 hours. Coagulation Profile: Recent Labs  Lab 06/21/20 1547  INR 1.4*   Cardiac Enzymes:  No results for input(s): CKTOTAL, CKMB, CKMBINDEX, TROPONINI in the last 168 hours. BNP (last 3 results) No results for input(s): PROBNP in the last 8760 hours. HbA1C: No results for input(s): HGBA1C in the last 72 hours. CBG: No results for input(s): GLUCAP in the last 168 hours. Lipid Profile: No results for input(s): CHOL, HDL, LDLCALC, TRIG, CHOLHDL, LDLDIRECT in the last 72 hours. Thyroid Function Tests: No results for input(s): TSH, T4TOTAL, FREET4, T3FREE, THYROIDAB in the last 72 hours. Anemia Panel: No results for input(s): VITAMINB12, FOLATE, FERRITIN, TIBC, IRON, RETICCTPCT in the last 72 hours. Urine analysis:    Component Value Date/Time   COLORURINE YELLOW 06/21/2020 1654   APPEARANCEUR HAZY (A) 06/21/2020 1654   LABSPEC 1.010 06/21/2020 1654   PHURINE 5.0 06/21/2020 1654   GLUCOSEU NEGATIVE 06/21/2020 1654   HGBUR SMALL (A) 06/21/2020 1654   BILIRUBINUR NEGATIVE 06/21/2020 1654   KETONESUR 5 (A) 06/21/2020 1654   PROTEINUR NEGATIVE 06/21/2020 1654   UROBILINOGEN 1.0 06/24/2015 1940   NITRITE NEGATIVE 06/21/2020 1654   LEUKOCYTESUR LARGE (A) 06/21/2020 1654    Radiological Exams on Admission: CT ABDOMEN PELVIS WO CONTRAST  Result Date: 06/21/2020 CLINICAL DATA:  Abdominal pain. EXAM: CT ABDOMEN AND PELVIS WITHOUT CONTRAST TECHNIQUE: Multidetector CT imaging of the abdomen and pelvis was performed following the standard protocol without IV contrast. COMPARISON:  CT dated April 01, 2015. FINDINGS: Lower chest: There is an opacity at the right lung base favored to represent an area of chronic atelectasis. There is a trace left-sided pleural effusion with adjacent atelectasis. The heart size is normal. Hepatobiliary: Again noted is a mass in the left  hepatic lobe favored to represent a hemangioma. Cholelithiasis without acute inflammation.There is no biliary ductal dilation. Pancreas: Normal contours without ductal dilatation. No peripancreatic fluid collection. Spleen: Unremarkable. Adrenals/Urinary Tract: --Adrenal glands: Unremarkable. --Right kidney/ureter: There is mild right-sided collecting system dilatation to the level of the urinary bladder. --Left kidney/ureter: There is moderate left-sided collecting system dilatation to the level of the urinary bladder. There are simple left renal cysts. --Urinary bladder: The urinary bladder is severely distended. Stomach/Bowel: --Stomach/Duodenum: No hiatal hernia or other gastric abnormality. Normal duodenal course and caliber. --Small bowel: Unremarkable. --Colon: There is a large amount of stool throughout the colon, especially at the level of the rectum. --Appendix: Not visualized. No right lower quadrant inflammation or free fluid. Vascular/Lymphatic: Atherosclerotic calcification is present within the non-aneurysmal abdominal aorta, without hemodynamically significant stenosis. --No retroperitoneal lymphadenopathy. --No mesenteric lymphadenopathy. --No pelvic or inguinal lymphadenopathy. Reproductive: The patient appears to be status post prior prostatectomy. Other: No ascites or free air. The abdominal wall is normal. Musculoskeletal. No acute displaced fractures. IMPRESSION: 1. Severely distended urinary bladder with mild to moderate bilateral collecting system dilatation to the level of the urinary bladder. 2. Large amount of stool throughout the colon, especially at the level of the rectum. 3. Cholelithiasis without acute inflammation. Aortic Atherosclerosis (ICD10-I70.0). Electronically Signed   By: Constance Holster M.D.   On: 06/21/2020 18:28   DG Chest Port 1 View  Result Date: 06/21/2020 CLINICAL DATA:  Sepsis. EXAM: PORTABLE CHEST 1 VIEW COMPARISON:  November 14, 2019. FINDINGS: Stable  cardiomediastinal silhouette. Lungs are clear. No pneumothorax or pleural effusion is noted. The visualized skeletal structures are unremarkable. IMPRESSION: No active disease. Electronically Signed   By: Marijo Conception M.D.   On: 06/21/2020 16:38   DG Abd Portable 1 View  Result Date: 06/21/2020 CLINICAL DATA:  Altered mental status.  Dementia.  EXAM: PORTABLE ABDOMEN - 1 VIEW COMPARISON:  CT 04/01/2015 FINDINGS: Numerous leads and wires project over the chest. Moderate amount of stool within the rectum. Non-obstructive bowel gas pattern. No abnormal abdominal calcifications. No appendicolith. Suspect radiation clips within the prostate. IMPRESSION: No acute findings.  Stool burden suggests fecal impaction. Electronically Signed   By: Abigail Miyamoto M.D.   On: 06/21/2020 16:37    EKG: Independently reviewed. Sinus tachycardia, rate 116, incomplete RBBB, motion artifact present.  Rate is faster when compared to prior.  Assessment/Plan Principal Problem:   Severe sepsis with acute organ dysfunction (HCC) Active Problems:   Hypertension associated with diabetes (HCC)   Insulin dependent type 2 diabetes mellitus ([place])   Sepsis due to urinary tract infection (HCC)   Acute renal failure superimposed on stage 3b chronic kidney disease (HCC)   Fecal impaction (HCC)   Hyperlipidemia associated with type 2 diabetes mellitus ([place])  Ulysees Barns Sr. is a 86 y.o. male with medical history significant for dementia, insulin-dependent type 2 diabetes, history of PE on ASA 325 mg, hypertension, hyperlipidemia, CKD stage III, and prostate cancer who is admitted with severe sepsis with acute organ dysfunction due to UTI.  Severe sepsis with acute organ dysfunction due to UTI: Patient presenting with tachycardia to 117, tachypnea with RR up to 28, leukocytosis 18.2 and evidence of endorgan dysfunction with acute kidney injury creatinine 3.14, lactic acidosis 5.6, and hypotension with SBP as low as 83 with map 62.   Urinalysis is suggestive of UTI, likely due to retention/obstruction possibly from fecal impaction.  Patient was initially given IV cefepime and Flagyl. -Narrow antibiotics IV ceftriaxone -Follow blood and urine cultures -Foley catheter placed in the ED with good urine output -Continue IV fluid resuscitation -Trend lactic acid  AKI on CKD stage III: In setting of sepsis and likely urinary retention.  Anticipate improvement after Foley catheter placement with good urine output.  Continue IV fluids and repeat labs in a.m.  Fecal impaction:  Give Dulcolax suppository.  Further bowel regimen if needed pending her response.  Insulin-dependent type 2 diabetes: Daughter reports patient has been occasionally becoming hypoglycemic at home.  Hold home meds and place on sensitive SSI for now.  History of PE: Previously on Eliquis, now on aspirin 325 mg daily.  Resume when awake enough to take meds.  Hypertension: Not on antihypertensives at home.  Was hypotensive on arrival, improving with fluids.  Hyperlipidemia: Resume statin when more awake.  Dementia with acute delirium: Excessively somnolent in the setting of severe sepsis.  Continue treatment of infection as above.  Goals of care: Had a long discussion with patient's daughter Bobbye Morton by phone.  She states that he is following with hospice care now.  We discussed that in the event of cardiac arrest he would be unlikely to have good outcome with aggressive measures such as CPR or intubation.  She agrees that his CODE STATUS should be DO NOT RESUSCITATE.  She states she will update their hospice team as well.  DVT prophylaxis: Subcutaneous heparin Code Status: DNR, confirmed with patient's daughter Family Communication: Discussed with patient's daughter Bobbye Morton by phone [phone] Disposition Plan: From home and likely discharge to home with or without hospice Consults called: None Admission status:  Status is:  Inpatient  Remains inpatient appropriate because:Hemodynamically unstable, IV treatments appropriate due to intensity of illness or inability to take PO and Inpatient level of care appropriate due to severity of illness   Dispo: The patient is  from: Home              Anticipated d/c is to: Home              Anticipated d/c date is: 3 days              Patient currently is not medically stable to d/c.    Zada Finders MD [facility]  If 7PM-7AM, please contact night-coverage [email or web address]  06/21/2020, 7:49 PM

## 2020-06-21 NOTE — ED Provider Notes (Signed)
[facility] DEPT Provider Note   CSN: [id number] Arrival date & time: 06/21/20  1458     History Chief Complaint  Patient presents with  . Weakness    Marvin LEBARRON Sr. is a 86 y.o. male.  HPI   Patient presents to the ED for evaluation of altered mental status.  History is limited as the patient is demented and at baseline according to the EMS history he is nonverbal.  Patient's not able to provide any history for me.  According to the EMS report the patient started having difficulties with nausea vomiting diarrhea and weakness.  Family was trying to get in contact with hospice.  Ended up calling the ambulance to bring the patient in for evaluation.  Discussed with daughter, Marvin Bishop.  She states he had about 40 minutes of chest pain and face pain this am.  Around 10 am he then started having stomach pains.  He then started vomiting.  Family thought he might have had a fever, measured temperature was normal.  He was given  Past Medical History:  Diagnosis Date  . Arthritis   . Asthma    hx of as child  . Complete loss of vision    right eye  . Dementia ([place])   . Diabetes mellitus   . Headache(784.0)    slight  . Hypercholesteremia   . Hypertension   . Insomnia    occasional  . Nephrolithiasis    right UVJ stone  . Peripheral neuropathy    both legs  . PONV (postoperative nausea and vomiting)   . Prostate cancer [facility])     Patient Active Problem List   Diagnosis Date Noted  . Altered mental status, unspecified 11/14/2019  . Pre-ulcerative calluses 04/29/2019  . Hypophosphatemia 05/31/2017  . TIA (transient ischemic attack) 05/27/2017  . Sepsis ([place]) 05/27/2017  . CKD (chronic kidney disease), stage III 05/27/2017  . Influenza 11/08/2016  . Chronic combined systolic and diastolic congestive heart failure ([place]) 11/08/2016  . History of pulmonary embolus (PE) 11/08/2016  . PE (pulmonary embolism) 04/07/2015  . Pulmonary embolism ([place])  04/07/2015  . Elevated troponin 04/07/2015  . Cough   . Pulmonary emboli ([place])   . Sinus tachycardia 04/01/2015  . Acid reflux 03/07/2015  . Insulin dependent type 2 diabetes mellitus ([place]) 03/07/2015  . Blind painful eye 01/07/2015  . Ileus ([place]) 08/23/2014  . Dementia without behavioral disturbance ([place]) 09/02/2013  . Renal calculus or stone 08/22/2013  . Hypokalemia 08/22/2013  . Hypertension 08/22/2013  . History of insulin dependent diabetes mellitus 08/22/2013  . Dehydration 08/22/2013  . Memory loss 04/23/2013  . Insomnia   . Carpal tunnel syndrome   . Diverticulosis     Past Surgical History:  Procedure Laterality Date  . APPENDECTOMY    . Cornea removed Right 02-26-15  . CYSTOSCOPY W/ URETERAL STENT PLACEMENT Right 10/01/2013   Procedure: cystoscopy right retrograde holmium laser lithotripsy;  Surgeon: Franchot Gallo, MD;  Location: [facility];  Service: Urology;  Laterality: Right;  holmium laser application  . HEMORROIDECTOMY    . KNEE ARTHROSCOPY Right   . PARS PLANA VITRECTOMY  10/24/2011   Procedure: PARS PLANA VITRECTOMY WITH 23 GAUGE;  Surgeon: Adonis Brook, MD;  Location: [facility];  Service: Ophthalmology;  Laterality: Right;  . TONSILLECTOMY  as teen       Family History  Problem Relation Age of Onset  . Cancer Mother        uterus  . Heart  Problems Father   . Cancer Brother        stomach  . Cancer Brother        throat  . Colon cancer Brother     Social History   Tobacco Use  . Smoking status: Former Smoker    Packs/day: 0.25    Types: Cigarettes    Quit date: 10/15/1966    Years since quitting: 53.7  . Smokeless tobacco: Never Used  Substance Use Topics  . Alcohol use: Yes    Comment: occasional  . Drug use: No    Home Medications Prior to Admission medications   Medication Sig Start Date End Date Taking? Authorizing Provider  acetaminophen (TYLENOL) 500 MG tablet Take 1,000 mg by mouth every 6 (six) hours as needed for moderate pain.     [provider]  apixaban (ELIQUIS) 2.5 MG TABS tablet Take 2.5 mg by mouth 2 (two) times daily.    [provider]  apixaban (ELIQUIS) 5 MG TABS tablet Take 1 tablet (5 mg total) by mouth 2 (two) times daily. 05/31/17   Sheikh, Georgina Quint Latif, DO  BD PEN NEEDLE NANO U/F 32G X 4 MM MISC See admin instructions. 07/13/15   [provider]  docusate sodium 100 MG CAPS Take 200 mg by mouth 2 (two) times daily as needed for mild constipation. Patient taking differently: Take 200 mg by mouth daily as needed (constipation).  08/25/14   Thurnell Lose, MD  erythromycin ophthalmic ointment Place 1 application into the right eye at bedtime. 05/03/17   [provider]  esomeprazole (NEXIUM) 40 MG capsule Take 1 capsule (40 mg total) by mouth 2 (two) times daily before a meal. 04/04/15   Barton Dubois, MD  famotidine (PEPCID) 20 MG tablet Take 1 tablet (20 mg total) by mouth at bedtime. 04/04/15   Barton Dubois, MD  Fiber Select Gummies CHEW Chew 1 tablet by mouth 2 (two) times daily.    [provider]  furosemide (LASIX) 40 MG tablet Take 40 mg by mouth See admin instructions. Take one tablet (40 mg) by mouth on Monday, Wednesday, Friday morning, take one tablet (40 mg) daily at noon    [provider]  gabapentin (NEURONTIN) 100 MG capsule Take 200 mg by mouth at bedtime.  09/21/15   [provider]  glipiZIDE (GLUCOTROL XL) 5 MG 24 hr tablet Take 5 mg by mouth daily. 02/11/20   [provider]  hydrocortisone (ANUSOL-HC) 2.5 % rectal cream  03/15/20   [provider]  insulin detemir (LEVEMIR FLEXTOUCH) 100 UNIT/ML FlexPen Inject 28 Units into the skin daily before breakfast.    [provider]  insulin lispro (HUMALOG KWIKPEN) 100 UNIT/ML KwikPen Inject 3-15 Units into the skin 3 (three) times daily as needed (CBG >150). Per sliding scale    [provider]  Loratadine (CLARITIN) 10 MG CAPS Take 10 mg by mouth daily  after breakfast.     [provider]  magnesium oxide (MAG-OX) 400 MG tablet Take 400 mg by mouth 2 (two) times daily.    [provider]  memantine (NAMENDA) 10 MG tablet Take 1 tablet (10 mg total) by mouth 2 (two) times daily. Please call [phone] to schedule yearly appt. 04/16/17   Marcial Pacas, MD  Multiple Vitamins-Minerals (ELDERTONIC PO) Take 15 mLs by mouth 2 (two) times daily.    [provider]  Naphazoline HCl (CLEAR EYES OP) Place 1 drop into the left eye at bedtime.  [provider]  nitrofurantoin, macrocrystal-monohydrate, (MACROBID) 100 MG capsule Take 100 mg by mouth 2 (two) times daily. 02/05/20   [provider]  ONE TOUCH ULTRA TEST test strip USE TO TEST TWICE A DAY 25 07/03/18   [provider]  polyethylene glycol (MIRALAX / GLYCOLAX) 17 g packet Take 17 g by mouth daily as needed (constipation).    [provider]  simvastatin (ZOCOR) 40 MG tablet Take 40 mg by mouth every morning.     [provider]  traMADol (ULTRAM) 50 MG tablet Take by mouth daily as needed (pain).    [provider]    Allergies    Donepezil, Hydrocodone, Hydrocodone-acetaminophen, and Penicillins  Review of Systems   Review of Systems  Unable to perform ROS: Mental status change    Physical Exam Updated Vital Signs BP 120/64   Pulse 97   Temp 97.9 F (36.6 C) (Oral)   Resp 16   SpO2 100%   Physical Exam Vitals and nursing note reviewed.  Constitutional:      Appearance: He is well-developed. He is ill-appearing.  HENT:     Head: Normocephalic and atraumatic.     Right Ear: External ear normal.     Left Ear: External ear normal.  Eyes:     General: No scleral icterus.       Right eye: No discharge.        Left eye: No discharge.     Conjunctiva/sclera: Conjunctivae normal.  Neck:     Trachea: No tracheal deviation.  Cardiovascular:     Rate and Rhythm: Regular rhythm. Tachycardia present.    Pulmonary:     Effort: Pulmonary effort is normal. No respiratory distress.     Breath sounds: Normal breath sounds. No stridor. No wheezing or rales.  Abdominal:     General: Bowel sounds are normal. There is distension.     Palpations: Abdomen is soft.     Tenderness: There is abdominal tenderness. There is no guarding or rebound.  Musculoskeletal:        General: No swelling or tenderness.     Cervical back: Neck supple.     Right lower leg: No edema.     Left lower leg: No edema.  Skin:    General: Skin is warm and dry.     Findings: No rash.  Neurological:     Mental Status: He is alert.     Cranial Nerves: Cranial nerve deficit:  no obvious facial droop, exam limited.     Motor: No abnormal muscle tone or seizure activity.     Comments: Does not follow commands, moans, occasionally nods yes but not consistently, will not move extremities when asked     ED Results / Procedures / Treatments   Labs (all labs ordered are listed, but only abnormal results are displayed) Labs Reviewed  COMPREHENSIVE METABOLIC PANEL - Abnormal; Notable for the following components:      Result Value   Glucose, Bld 202 (*)    BUN 49 (*)    Creatinine, Ser 3.14 (*)    Calcium 8.6 (*)    Albumin 2.7 (*)    GFR calc non Af Amer 16 (*)    GFR calc Af Amer 18 (*)    Anion gap 18 (*)    All other components within normal limits  CBC WITH DIFFERENTIAL/PLATELET - Abnormal; Notable for the following components:   WBC 18.2 (*)    RBC 3.96 (*)  Hemoglobin 10.4 (*)    HCT 32.8 (*)    Neutro Abs 15.9 (*)    Lymphs Abs 0.6 (*)    Monocytes Absolute 1.6 (*)    All other components within normal limits  PROTIME-INR - Abnormal; Notable for the following components:   Prothrombin Time 16.2 (*)    INR 1.4 (*)    All other components within normal limits  URINALYSIS, ROUTINE W REFLEX MICROSCOPIC - Abnormal; Notable for the following components:   APPearance HAZY (*)    Hgb urine dipstick SMALL (*)     Ketones, ur 5 (*)    Leukocytes,Ua LARGE (*)    WBC, UA >50 (*)    Bacteria, UA MANY (*)    All other components within normal limits  TROPONIN I (HIGH SENSITIVITY) - Abnormal; Notable for the following components:   Troponin I (High Sensitivity) 71 (*)    All other components within normal limits  SARS CORONAVIRUS 2 BY RT PCR (HOSPITAL ORDER, [facility])  CULTURE, BLOOD (ROUTINE X 2)  CULTURE, BLOOD (ROUTINE X 2)  URINE CULTURE  LACTIC ACID, PLASMA  APTT  LACTIC ACID, PLASMA  TROPONIN I (HIGH SENSITIVITY)    EKG EKG Interpretation  Date/Time:  Tuesday June 21 2020 15:50:01 EDT Ventricular Rate:  116 PR Interval:    QRS Duration: 114 QT Interval:  340 QTC Calculation: 473 R Axis:   -19 Text Interpretation: Sinus tachycardia Incomplete right bundle branch block Artifact in lead(s) I III aVL Since last tracing rate faster Confirmed by Dorie Rank [phone]) on 06/21/2020 4:31:52 PM   Radiology CT ABDOMEN PELVIS WO CONTRAST  Result Date: 06/21/2020 CLINICAL DATA:  Abdominal pain. EXAM: CT ABDOMEN AND PELVIS WITHOUT CONTRAST TECHNIQUE: Multidetector CT imaging of the abdomen and pelvis was performed following the standard protocol without IV contrast. COMPARISON:  CT dated April 01, 2015. FINDINGS: Lower chest: There is an opacity at the right lung base favored to represent an area of chronic atelectasis. There is a trace left-sided pleural effusion with adjacent atelectasis. The heart size is normal. Hepatobiliary: Again noted is a mass in the left hepatic lobe favored to represent a hemangioma. Cholelithiasis without acute inflammation.There is no biliary ductal dilation. Pancreas: Normal contours without ductal dilatation. No peripancreatic fluid collection. Spleen: Unremarkable. Adrenals/Urinary Tract: --Adrenal glands: Unremarkable. --Right kidney/ureter: There is mild right-sided collecting system dilatation to the level of the urinary bladder. --Left  kidney/ureter: There is moderate left-sided collecting system dilatation to the level of the urinary bladder. There are simple left renal cysts. --Urinary bladder: The urinary bladder is severely distended. Stomach/Bowel: --Stomach/Duodenum: No hiatal hernia or other gastric abnormality. Normal duodenal course and caliber. --Small bowel: Unremarkable. --Colon: There is a large amount of stool throughout the colon, especially at the level of the rectum. --Appendix: Not visualized. No right lower quadrant inflammation or free fluid. Vascular/Lymphatic: Atherosclerotic calcification is present within the non-aneurysmal abdominal aorta, without hemodynamically significant stenosis. --No retroperitoneal lymphadenopathy. --No mesenteric lymphadenopathy. --No pelvic or inguinal lymphadenopathy. Reproductive: The patient appears to be status post prior prostatectomy. Other: No ascites or free air. The abdominal wall is normal. Musculoskeletal. No acute displaced fractures. IMPRESSION: 1. Severely distended urinary bladder with mild to moderate bilateral collecting system dilatation to the level of the urinary bladder. 2. Large amount of stool throughout the colon, especially at the level of the rectum. 3. Cholelithiasis without acute inflammation. Aortic Atherosclerosis (ICD10-I70.0). Electronically Signed   By: Constance Holster M.D.   On:  06/21/2020 18:28   DG Chest Port 1 View  Result Date: 06/21/2020 CLINICAL DATA:  Sepsis. EXAM: PORTABLE CHEST 1 VIEW COMPARISON:  November 14, 2019. FINDINGS: Stable cardiomediastinal silhouette. Lungs are clear. No pneumothorax or pleural effusion is noted. The visualized skeletal structures are unremarkable. IMPRESSION: No active disease. Electronically Signed   By: Marijo Conception M.D.   On: 06/21/2020 16:38   DG Abd Portable 1 View  Result Date: 06/21/2020 CLINICAL DATA:  Altered mental status.  Dementia. EXAM: PORTABLE ABDOMEN - 1 VIEW COMPARISON:  CT 04/01/2015 FINDINGS:  Numerous leads and wires project over the chest. Moderate amount of stool within the rectum. Non-obstructive bowel gas pattern. No abnormal abdominal calcifications. No appendicolith. Suspect radiation clips within the prostate. IMPRESSION: No acute findings.  Stool burden suggests fecal impaction. Electronically Signed   By: Abigail Miyamoto M.D.   On: 06/21/2020 16:37    Procedures .Critical Care Performed by: Dorie Rank, MD Authorized by: Dorie Rank, MD   Critical care provider statement:    Critical care time (minutes):  45   Critical care was time spent personally by me on the following activities:  Discussions with consultants, evaluation of patient's response to treatment, examination of patient, ordering and performing treatments and interventions, ordering and review of laboratory studies, ordering and review of radiographic studies, pulse oximetry, re-evaluation of patient's condition, obtaining history from patient or surrogate and review of old charts   (including critical care time)  Medications Ordered in ED Medications  lactated ringers infusion ( Intravenous Stopped 06/21/20 1755)  lactated ringers bolus 1,000 mL (0 mLs Intravenous Stopped 06/21/20 1654)    And  lactated ringers bolus 1,000 mL (0 mLs Intravenous Stopped 06/21/20 1701)    And  lactated ringers bolus 1,000 mL (0 mLs Intravenous Stopped 06/21/20 1635)  ceFEPIme (MAXIPIME) 2 g in sodium chloride 0.9 % 100 mL IVPB (0 g Intravenous Stopped 06/21/20 1635)  metroNIDAZOLE (FLAGYL) IVPB 500 mg (0 mg Intravenous Stopped 06/21/20 1635)  lactated ringers bolus 1,000 mL (0 mLs Intravenous Stopped 06/21/20 1848)    ED Course  I have reviewed the triage vital signs and the nursing notes.  Pertinent labs & imaging results that were available during my care of the patient were reviewed by me and considered in my medical decision making (see chart for details).  Clinical Course as of Jun 21 1856  Tue Jun 21, 2020  1523 Hypotensive and  tachycardic on arrival.  Will start abx, fluids, check labs.     [YT]  0354 SFKCL with [facility].  Confirmed code status.  Referred to [facility].  Called the provided number but no answer.   [JK]  1603 BP improved to 112/61   [JK]  1608 Spoke with patient daughter Marvin Bishop, she was able to provide additional information   [JK]  1713 Creatinine shows AKI with elevated BUN and creatinine. Troponin also elevated at 71. With his elevated white blood cell count I will order Noncon CT.   [JK]  1718 Updated family.  Foley catheter in place.  No urine output yet    [JK]  1801 UA suggests UTI   [JK]  1803 BP still low.  Will give additional fluid bolud   [JK]  1826 BP improved to 275 systolic at bedside   [JK]  1840 Discussed with RN.  Pt has a condom cath.  Foley cath ordered.  Prelim review of ct by me shows a distended bladder    [JK]  1856 Sepsis recheck.  BP now 120/64.  Pulse 97.  Mentation has improved.  Cap refill brisk.   [JK]    Clinical Course User Index [JK] Dorie Rank, MD   MDM Rules/Calculators/A&P                          Patient presented to the ED for evaluation of weakness, abdominal pain vomiting and diarrhea.  Patient was noted to be hypotensive and tachycardic.  Symptoms concerning for sepsis.  Patient has been started on IV fluids and empiric antibiotic.  Laboratory tests show evidence of acute kidney injury.  Urinalysis is consistent with UTI.  Patient has responded to treatment.  Most recent blood pressure is now 120/64 and his pulse rate is 97.  CT scan shows evidence of bladder outlet obstruction.  Patient initially had a condom catheter on.  Foley catheter has been ordered.  I will consult the medical service for admission and further treatment. Final Clinical Impression(s) / ED Diagnoses Final diagnoses:  Sepsis with acute renal failure and septic shock, due to unspecified organism, unspecified acute renal failure type ([place])  Acute cystitis without hematuria    Urinary retention  Constipation, unspecified constipation type  Calculus of gallbladder without cholecystitis without obstruction     Dorie Rank, MD 06/21/20 1857

## 2020-06-21 NOTE — Sepsis Progress Note (Signed)
Notified bedside nurse of need to draw repeat lactic acid 3rd draw it is elevated.

## 2020-06-22 DIAGNOSIS — A419 Sepsis, unspecified organism: Secondary | ICD-10-CM | POA: Diagnosis present

## 2020-06-22 LAB — BASIC METABOLIC PANEL
Anion gap: 13 (ref 5–15)
Anion gap: 12 (ref 5–15)
BUN: 36 mg/dL — ABNORMAL HIGH (ref 8–23)
BUN: 35 mg/dL — ABNORMAL HIGH (ref 8–23)
CO2: 24 mmol/L (ref 22–32)
CO2: 27 mmol/L (ref 22–32)
Calcium: 7.8 mg/dL — ABNORMAL LOW (ref 8.9–10.3)
Calcium: 8.4 mg/dL — ABNORMAL LOW (ref 8.9–10.3)
Chloride: 103 mmol/L (ref 98–111)
Chloride: 104 mmol/L (ref 98–111)
Creatinine, Ser: 2.23 mg/dL — ABNORMAL HIGH (ref 0.61–1.24)
Creatinine, Ser: 2.24 mg/dL — ABNORMAL HIGH (ref 0.61–1.24)
GFR calc Af Amer: 27 mL/min — ABNORMAL LOW (ref >60)
GFR calc Af Amer: 27 mL/min — ABNORMAL LOW (ref >60)
GFR calc non Af Amer: 24 mL/min — ABNORMAL LOW (ref >60)
GFR calc non Af Amer: 24 mL/min — ABNORMAL LOW (ref >60)
Glucose, Bld: 133 mg/dL — ABNORMAL HIGH (ref 70–99)
Glucose, Bld: 196 mg/dL — ABNORMAL HIGH (ref 70–99)
Potassium: 3 mmol/L — ABNORMAL LOW (ref 3.5–5.1)
Potassium: 2.9 mmol/L — ABNORMAL LOW (ref 3.5–5.1)
Sodium: 140 mmol/L (ref 135–145)
Sodium: 143 mmol/L (ref 135–145)

## 2020-06-22 LAB — CBC
HCT: 25.2 % — ABNORMAL LOW (ref 39.0–52.0)
Hemoglobin: 8.2 g/dL — ABNORMAL LOW (ref 13.0–17.0)
MCH: 26.8 pg (ref 26.0–34.0)
MCHC: 32.5 g/dL (ref 30.0–36.0)
MCV: 82.4 fL (ref 80.0–100.0)
Platelets: 251 10*3/uL (ref 150–400)
RBC: 3.06 MIL/uL — ABNORMAL LOW (ref 4.22–5.81)
RDW: 15.1 % (ref 11.5–15.5)
WBC: 11.9 10*3/uL — ABNORMAL HIGH (ref 4.0–10.5)
nRBC: 0 % (ref 0.0–0.2)

## 2020-06-22 LAB — PROCALCITONIN: Procalcitonin: 1.71 ng/mL

## 2020-06-22 LAB — CBG MONITORING, ED
Glucose-Capillary: 113 mg/dL — ABNORMAL HIGH (ref 70–99)
Glucose-Capillary: 124 mg/dL — ABNORMAL HIGH (ref 70–99)
Glucose-Capillary: 138 mg/dL — ABNORMAL HIGH (ref 70–99)
Glucose-Capillary: 152 mg/dL — ABNORMAL HIGH (ref 70–99)
Glucose-Capillary: 158 mg/dL — ABNORMAL HIGH (ref 70–99)
Glucose-Capillary: 169 mg/dL — ABNORMAL HIGH (ref 70–99)

## 2020-06-22 LAB — LACTIC ACID, PLASMA
Lactic Acid, Venous: 3 mmol/L (ref 0.5–1.9)
Lactic Acid, Venous: 1.6 mmol/L (ref 0.5–1.9)
Lactic Acid, Venous: 1.3 mmol/L (ref 0.5–1.9)

## 2020-06-22 LAB — URINE CULTURE

## 2020-06-22 LAB — HEMOGLOBIN A1C
Hgb A1c MFr Bld: 7.3 % — ABNORMAL HIGH (ref 4.8–5.6)
Mean Plasma Glucose: 162.81 mg/dL

## 2020-06-22 MED ORDER — BISACODYL 10 MG RE SUPP
10.0000 mg | Freq: Once | RECTAL | Status: AC
  Administered 2020-06-22: 10 mg via RECTAL
  Filled 2020-06-22: qty 1

## 2020-06-22 MED ORDER — ERYTHROMYCIN 5 MG/GM OP OINT
1.0000 "application " | TOPICAL_OINTMENT | Freq: Every day | OPHTHALMIC | Status: DC
  Administered 2020-06-23 – 2020-06-25 (×4): 1 via OPHTHALMIC
  Filled 2020-06-22: qty 1

## 2020-06-22 MED ORDER — PANTOPRAZOLE SODIUM 40 MG IV SOLR
40.0000 mg | INTRAVENOUS | Status: DC
  Administered 2020-06-22 – 2020-06-24 (×3): 40 mg via INTRAVENOUS
  Filled 2020-06-22 (×3): qty 40

## 2020-06-22 MED ORDER — LORAZEPAM 2 MG/ML IJ SOLN
0.5000 mg | INTRAMUSCULAR | Status: DC | PRN
  Administered 2020-06-22 – 2020-06-26 (×3): 0.5 mg via INTRAVENOUS
  Filled 2020-06-22 (×4): qty 1

## 2020-06-22 MED ORDER — SORBITOL 70 % SOLN
960.0000 mL | TOPICAL_OIL | Freq: Once | ORAL | Status: DC
  Filled 2020-06-22: qty 473

## 2020-06-22 NOTE — Progress Notes (Addendum)
[facility]) hospitalized hospice patient note.  This patient is a current hospice patient with a terminal diagnosis of Cerebrovascular atherosclerosis.  Family activated EMS because patient developed nausea/vomiting and pain. Family notified hospice after patient was transported to the hospital. Patient was admitted on 06/21/2020 with a diagnosis of severe sepsis. Per Dr. Orpah Melter with [facility], this is a related hospital admission.   Patient remains in [facility] ED holding for a inpatient bed. Nurse reports that he is currently at baseline per daughter for orientation. He has a cardiac diet ordered but not oral intake documented. He was given an enema for impaction today and tolerated well. Spoke with daughter, [name] by phone.   VS: 97.8, 132/70, 100, 18, 100% RA I/O: 1118/825  Abnormal labs:  Results for Marvin, Bishop (MRN [id number]) as of 06/22/2020 12:57  Ref. Range 06/22/2020 03:41      Potassium Latest Ref Range: 3.5 - 5.1 mmol/L 3.0 (L)          Glucose Latest Ref Range: 70 - 99 mg/dL 133 (H)  BUN Latest Ref Range: 8 - 23 mg/dL 36 (H)  Creatinine Latest Ref Range: 0.61 - 1.24 mg/dL 2.23 (H)  Calcium Latest Ref Range: 8.9 - 10.3 mg/dL 7.8 (L)      GFR, Est Non African American Latest Ref Range: >60 mL/min 24 (L)  GFR, Est African American Latest Ref Range: >60 mL/min 27 (L)  Lactic Acid, Venous Latest Ref Range: 0.5 - 1.9 mmol/L 3.0 (HH)      WBC Latest Ref Range: 4.0 - 10.5 K/uL 11.9 (H)  RBC Latest Ref Range: 4.22 - 5.81 MIL/uL 3.06 (L)  Hemoglobin Latest Ref Range: 13.0 - 17.0 g/dL 8.2 (L)  HCT Latest Ref Range: 39 - 52 % 25.2 (L)   Diagnostics  CT abdoment IMPRESSION: 1. Severely distended urinary bladder with mild to moderate bilateral collecting system dilatation to the level of the urinary bladder. 2. Large amount of stool throughout the colon, especially at the level of the rectum. 3. Cholelithiasis without acute  inflammation.  IV/PRN medications: Rocephin 1gm IVPB QD   Problem List:  _ Severe sepsis with acute organ dysfunction due to UTI _ AKI on CKD stage III: _ Fecal Impaction _ Dementia with acute delirium: excessively somnolent   Discharge planning: Ongoing. [facility] lisiason will follow while hospitalized and coordinate with home care team for patient's discharge and return home.  Family Contact: As noted above, spoke with Marvin Bishop, explained liaison role and offered support.   IDG: updated  Goals of Care: Patient is now a DNR.   Should patient need ambulance transport at discharge, please use [facility] as they contract this service for all of our active hospice patients.   Please call with any hospice related questions.  Farrel Gordon, RN, CCM  [facility] Liaison (listed on [place] under Hospice/[facility])    [phone]

## 2020-06-22 NOTE — Progress Notes (Signed)
PROGRESS NOTE  Marvin Bishop WUX:[id number] DOB: 20-May-1920 DOA: 06/21/2020 PCP: [name], MD  Brief History   Marvin Barns Sr. is a 86 y.o. male with medical history significant for dementia, insulin-dependent type 2 diabetes, history of PE on ASA 325 mg, hypertension, hyperlipidemia, CKD stage III, and prostate cancer who presents to the ED for evaluation of acute encephalopathy, nausea, vomiting, diarrhea.  History is limited from patient due to dementia and encephalopathy therefore majority of history is obtained from EDP, chart review, and daughter by phone.  Daughter states that patient is on complete bedrest at his baseline.  He lives at home with family and has daily aides come to the house.  2 days ago he had notable decreased urine output than expected with foul odor to his urine.  Urine output apparently improved the following day however earlier today he had an episode of chest pain followed by abdominal pain.  He developed nausea, vomiting, and several episodes of diarrhea.  He was brought to the ED for further evaluation.  ED Course:  Initial vitals showed BP 112/61, pulse 117, RR 17, temp 97.9 Fahrenheit, SPO2 96% on room air.  While in the ED SBP is as low as 83 with map 62, RR up to 28.  Labs show BUN 49, creatinine 3.14 (previously 1.76 on 03/26/2020), sodium 144, potassium 3.5, bicarb 24, serum glucose 202, LFTs within normal limits, WBC 18.2, hemoglobin 10.4, platelets 229,000, lactic acid 1.8 > 5.6, high-sensitivity troponin I 71 and 76.  Urinalysis showed negative nitrates, large leukocytes, 6-10 RBC/hpf, >50 WBC/hpf, many bacteria microscopy.  Blood and urine cultures were obtained and pending.  SARS-CoV-2 PCR is negative.  Abdominal x-ray showed stool burden suggestive of fecal impaction.  Portable chest x-ray was negative for focal consolidation, edema, or effusion.  CT abdomen/pelvis without contrast showed a severely distended urinary bladder with mild to  moderate bilateral collecting system dilatation to the level of the urinary bladder.  Large amount of stool throughout the colon especially at the level of the rectum is seen.  Cholelithiasis without acute inflammation also noted.  Patient was given 4 L LR and placed on maintenance fluids.  Empiric antibiotics with cefepime and Flagyl were started.  The hospitalist service was consulted to admit for further evaluation management.  Consultants  . Hospice  Procedures  . None  Antibiotics   Anti-infectives (From admission, onward)   Start     Dose/Rate Route Frequency Ordered Stop   06/21/20 2200  cefTRIAXone (ROCEPHIN) 1 g in sodium chloride 0.9 % 100 mL IVPB        1 g 200 mL/hr over 30 Minutes Intravenous Every 24 hours 06/21/20 2018     06/21/20 1530  ceFEPIme (MAXIPIME) 2 g in sodium chloride 0.9 % 100 mL IVPB        2 g 200 mL/hr over 30 Minutes Intravenous  Once 06/21/20 1521 06/21/20 1635   06/21/20 1530  metroNIDAZOLE (FLAGYL) IVPB 500 mg        500 mg 100 mL/hr over 60 Minutes Intravenous  Once 06/21/20 1521 06/21/20 1635    .  Subjective  The patient is lying on the gurney with his daughter at bedside. He is calm now, but had previously been agitated. He has been nauseated per the daughter. The patient is not able to express himself.  Objective   Vitals:  Vitals:   06/22/20 1145 06/22/20 1605  BP: 132/70 (!) 112/59  Pulse: 97 78  Resp: 18  20  Temp:  99 F (37.2 C)  SpO2: 92% 91%    Exam:  Constitutional:  . The patient is awake and alert. He is confused. His speech is not intelligible. The patient's daughter states that the patient is far from his baseline mental status. No acute distress. Respiratory:  . No increased work of breathing. . No wheezes, rales, or rhonchi . No tactile fremitus Cardiovascular:  . Regular rate and rhythm . No murmurs, ectopy, or gallups. . No lateral PMI. No thrills. Abdomen:  . Abdomen is tender and distended . No hernias,  masses, or organomegaly . Hypoactive bowel sounds.  Musculoskeletal:  . No cyanosis, clubbing, or edema Skin:  . No rashes, lesions, ulcers . palpation of skin: no induration or nodules Neurologic:  . CN 2-12 intact . Sensation all 4 extremities intact Psychiatric:  . Mental status o Mood, affect appropriate o Orientation to person, place, time  . judgment and insight appear intact   I have personally reviewed the following:   Today's Data  . Vitals, BMP, CBC, Procalcitonin, Glucose  Micro Data  .  o Blood cultures x 2: No growth o Urine Culture: No growth  Imaging  . CT Abdomen and Pelvis:   o Distended urinary bladder o Fecal impaction   Cardiology Data  . EKG: Sinus tachycardia  Scheduled Meds: . heparin  5,000 Units Subcutaneous Q8H  . insulin aspart  0-9 Units Subcutaneous Q4H  . sodium chloride flush  3 mL Intravenous Q12H   Continuous Infusions: . cefTRIAXone (ROCEPHIN)  IV Stopped (06/22/20 0523)    Principal Problem:   Severe sepsis with acute organ dysfunction (HCC) Active Problems:   Hypertension associated with diabetes (HCC)   Insulin dependent type 2 diabetes mellitus ([place])   Sepsis due to urinary tract infection (HCC)   Acute renal failure superimposed on stage 3b chronic kidney disease (HCC)   Fecal impaction (HCC)   Hyperlipidemia associated with type 2 diabetes mellitus ([place])   Counseling regarding advance care planning and goals of care   Severe sepsis ([place])   LOS: 1 day   A & P  Severe sepsis with acute organ dysfunction due to UTI: Patient presenting with tachycardia to 117, tachypnea with RR up to 28, leukocytosis 18.2 and evidence of endorgan dysfunction with acute kidney injury creatinine 3.14, lactic acidosis 5.6, and hypotension with SBP as low as 83 with map 62.  Urinalysis is suggestive of UTI, likely due to retention/obstruction possibly from fecal impaction.  Patient was initially given IV cefepime and Flagyl. This has been  narrowed to IV Ceftriaxone. Blood cultures and urine culture have been collected and will be followed.   Urinary retention: Foley in place. Will start flomax with blood pressures will allow.   Lactic Acidosis: 5.6 in ED. Down to 3.0 this morning with IV fluid resuscitation. Continue to follow.  AKI on CKD stage III: In setting of sepsis and likely urinary retention.  Anticipate improvement after Foley catheter placement with good urine output.  Improved somewhat this morning. Follow creatinine, electrolytes and volume status. Also follow urinary output as patient is at risk of diursis following obstructive uropathy.  Fecal impaction: Given Dulcolax suppository in the ED to no avail. Soap suds enema ordered. Further bowel regimen if needed pending his response.  Insulin-dependent type 2 diabetes: Daughter reports patient has been occasionally becoming hypoglycemic at home.  Hold home meds and place on sensitive SSI for now.  History of PE: Previously on Eliquis, now on aspirin  325 mg daily.  Resume when awake enough to take meds.  Hypertension: Not on antihypertensives at home.  Was hypotensive on arrival, improving with fluids. Monitor.  Hyperlipidemia: Resume statin when more awake.  Dementia with acute delirium: Excessively somnolent in the setting of severe sepsis.  Continue treatment of infection as above.  Goals of care: The patient's daughter Bobbye Morton was at the patient's bedside.  She states that he is following with hospice care now.  We discussed that in the event of cardiac arrest he would be unlikely to have good outcome with aggressive measures such as CPR or intubation.  She agrees that his CODE STATUS should be DO NOT RESUSCITATE.  She states she will update their hospice team as well. I appreciate the assistance of hospice.  DVT prophylaxis: Subcutaneous heparin Code Status: DNR Family Communication: Discussed with patient's daughter Bobbye Morton at  bedside. Disposition Plan: From home and likely discharge to home with or without hospice Consults called: None Admission status:  Status is: Inpatient  Remains inpatient appropriate because:Hemodynamically unstable, IV treatments appropriate due to intensity of illness or inability to take PO and Inpatient level of care appropriate due to severity of illness   Dispo: The patient is from: Home  Anticipated d/c is to: Home  Anticipated d/c date is: 3 days  Patient currently is not medically stable to d/c.   Donnel Venuto, DO [facility] Direct contact: see [email or web address]  7PM-7AM contact night coverage as above 06/22/2020, 5:45 PM  LOS: 1 day

## 2020-06-22 NOTE — ED Notes (Signed)
Soap suds enema performed. Pt tolerated well. Unable to hold fluid in. Small formed stool returned. Linen changed.

## 2020-06-23 ENCOUNTER — Inpatient Hospital Stay (HOSPITAL_COMMUNITY)

## 2020-06-23 LAB — GLUCOSE, CAPILLARY
Glucose-Capillary: 102 mg/dL — ABNORMAL HIGH (ref 70–99)
Glucose-Capillary: 110 mg/dL — ABNORMAL HIGH (ref 70–99)
Glucose-Capillary: 137 mg/dL — ABNORMAL HIGH (ref 70–99)
Glucose-Capillary: 165 mg/dL — ABNORMAL HIGH (ref 70–99)
Glucose-Capillary: 179 mg/dL — ABNORMAL HIGH (ref 70–99)
Glucose-Capillary: 83 mg/dL (ref 70–99)
Glucose-Capillary: 96 mg/dL (ref 70–99)

## 2020-06-23 LAB — BASIC METABOLIC PANEL
Anion gap: 9 (ref 5–15)
Anion gap: 10 (ref 5–15)
Anion gap: 11 (ref 5–15)
BUN: 30 mg/dL — ABNORMAL HIGH (ref 8–23)
BUN: 28 mg/dL — ABNORMAL HIGH (ref 8–23)
BUN: 22 mg/dL (ref 8–23)
CO2: 26 mmol/L (ref 22–32)
CO2: 26 mmol/L (ref 22–32)
CO2: 26 mmol/L (ref 22–32)
Calcium: 8.2 mg/dL — ABNORMAL LOW (ref 8.9–10.3)
Calcium: 8 mg/dL — ABNORMAL LOW (ref 8.9–10.3)
Calcium: 8.4 mg/dL — ABNORMAL LOW (ref 8.9–10.3)
Chloride: 107 mmol/L (ref 98–111)
Chloride: 106 mmol/L (ref 98–111)
Chloride: 106 mmol/L (ref 98–111)
Creatinine, Ser: 1.96 mg/dL — ABNORMAL HIGH (ref 0.61–1.24)
Creatinine, Ser: 1.8 mg/dL — ABNORMAL HIGH (ref 0.61–1.24)
Creatinine, Ser: 1.64 mg/dL — ABNORMAL HIGH (ref 0.61–1.24)
GFR calc Af Amer: 32 mL/min — ABNORMAL LOW (ref >60)
GFR calc Af Amer: 36 mL/min — ABNORMAL LOW (ref >60)
GFR calc Af Amer: 40 mL/min — ABNORMAL LOW (ref >60)
GFR calc non Af Amer: 28 mL/min — ABNORMAL LOW (ref >60)
GFR calc non Af Amer: 31 mL/min — ABNORMAL LOW (ref >60)
GFR calc non Af Amer: 34 mL/min — ABNORMAL LOW (ref >60)
Glucose, Bld: 112 mg/dL — ABNORMAL HIGH (ref 70–99)
Glucose, Bld: 190 mg/dL — ABNORMAL HIGH (ref 70–99)
Glucose, Bld: 158 mg/dL — ABNORMAL HIGH (ref 70–99)
Potassium: 2.8 mmol/L — ABNORMAL LOW (ref 3.5–5.1)
Potassium: 2.8 mmol/L — ABNORMAL LOW (ref 3.5–5.1)
Potassium: 2.9 mmol/L — ABNORMAL LOW (ref 3.5–5.1)
Sodium: 142 mmol/L (ref 135–145)
Sodium: 142 mmol/L (ref 135–145)
Sodium: 143 mmol/L (ref 135–145)

## 2020-06-23 LAB — BLOOD CULTURE ID PANEL (REFLEXED) - BCID2

## 2020-06-23 LAB — CBC WITH DIFFERENTIAL/PLATELET
Abs Immature Granulocytes: 0.03 10*3/uL (ref 0.00–0.07)
Basophils Absolute: 0 10*3/uL (ref 0.0–0.1)
Basophils Relative: 0 %
Eosinophils Absolute: 0.1 10*3/uL (ref 0.0–0.5)
Eosinophils Relative: 1 %
HCT: 30.1 % — ABNORMAL LOW (ref 39.0–52.0)
Hemoglobin: 9.5 g/dL — ABNORMAL LOW (ref 13.0–17.0)
Immature Granulocytes: 0 %
Lymphocytes Relative: 16 %
Lymphs Abs: 1.4 10*3/uL (ref 0.7–4.0)
MCH: 26.3 pg (ref 26.0–34.0)
MCHC: 31.6 g/dL (ref 30.0–36.0)
MCV: 83.4 fL (ref 80.0–100.0)
Monocytes Absolute: 1.1 10*3/uL — ABNORMAL HIGH (ref 0.1–1.0)
Monocytes Relative: 12 %
Neutro Abs: 6.4 10*3/uL (ref 1.7–7.7)
Neutrophils Relative %: 71 %
Platelets: 281 10*3/uL (ref 150–400)
RBC: 3.61 MIL/uL — ABNORMAL LOW (ref 4.22–5.81)
RDW: 15.2 % (ref 11.5–15.5)
WBC: 9.1 10*3/uL (ref 4.0–10.5)
nRBC: 0 % (ref 0.0–0.2)

## 2020-06-23 LAB — MAGNESIUM: Magnesium: 1.9 mg/dL (ref 1.7–2.4)

## 2020-06-23 MED ORDER — POTASSIUM CHLORIDE 10 MEQ/100ML IV SOLN
10.0000 meq | INTRAVENOUS | Status: AC
  Administered 2020-06-23 (×4): 10 meq via INTRAVENOUS
  Filled 2020-06-23: qty 100

## 2020-06-23 MED ORDER — POTASSIUM CHLORIDE 10 MEQ/100ML IV SOLN: 10.0000 meq | INTRAVENOUS | Status: DC

## 2020-06-23 MED ORDER — POLYETHYLENE GLYCOL 3350 17 G PO PACK
17.0000 g | PACK | Freq: Two times a day (BID) | ORAL | Status: DC
  Administered 2020-06-23 – 2020-06-25 (×4): 17 g via ORAL
  Filled 2020-06-23 (×4): qty 1

## 2020-06-23 NOTE — Progress Notes (Signed)
PHARMACY - PHYSICIAN COMMUNICATION CRITICAL VALUE ALERT - BLOOD CULTURE IDENTIFICATION (BCID)  Marvin Bishop. is an 86 y.o. male who presented to [facility] on 06/21/2020 with a chief complaint of altered mental status, nausea, vomiting  Assessment:  urospesis (include suspected source if known)  Name of physician (or Provider) Contacted: Lang Snow, FNP  Current antibiotics: ceftriaxone  Changes to prescribed antibiotics recommended:  Likely contaminant, no additional antibiotics needed   Results for orders placed or performed during the hospital encounter of 06/21/20  Blood Culture ID Panel (Reflexed) (Collected: 06/21/2020  3:48 PM)  Result Value Ref Range   Enterococcus faecalis NOT DETECTED NOT DETECTED   Enterococcus Faecium NOT DETECTED NOT DETECTED   Listeria monocytogenes NOT DETECTED NOT DETECTED   Staphylococcus species DETECTED (A) NOT DETECTED   Staphylococcus aureus (BCID) NOT DETECTED NOT DETECTED   Staphylococcus epidermidis DETECTED (A) NOT DETECTED   Staphylococcus lugdunensis NOT DETECTED NOT DETECTED   Streptococcus species NOT DETECTED NOT DETECTED   Streptococcus agalactiae NOT DETECTED NOT DETECTED   Streptococcus pneumoniae NOT DETECTED NOT DETECTED   Streptococcus pyogenes NOT DETECTED NOT DETECTED   A.calcoaceticus-baumannii NOT DETECTED NOT DETECTED   Bacteroides fragilis NOT DETECTED NOT DETECTED   Enterobacterales NOT DETECTED NOT DETECTED   Enterobacter cloacae complex NOT DETECTED NOT DETECTED   Escherichia coli NOT DETECTED NOT DETECTED   Klebsiella aerogenes NOT DETECTED NOT DETECTED   Klebsiella oxytoca NOT DETECTED NOT DETECTED   Klebsiella pneumoniae NOT DETECTED NOT DETECTED   Proteus species NOT DETECTED NOT DETECTED   Salmonella species NOT DETECTED NOT DETECTED   Serratia marcescens NOT DETECTED NOT DETECTED   Haemophilus influenzae NOT DETECTED NOT DETECTED   Neisseria meningitidis NOT DETECTED NOT DETECTED   Pseudomonas aeruginosa  NOT DETECTED NOT DETECTED   Stenotrophomonas maltophilia NOT DETECTED NOT DETECTED   Candida albicans NOT DETECTED NOT DETECTED   Candida auris NOT DETECTED NOT DETECTED   Candida glabrata NOT DETECTED NOT DETECTED   Candida krusei NOT DETECTED NOT DETECTED   Candida parapsilosis NOT DETECTED NOT DETECTED   Candida tropicalis NOT DETECTED NOT DETECTED   Cryptococcus neoformans/gattii NOT DETECTED NOT DETECTED   Methicillin resistance mecA/C NOT DETECTED NOT DETECTED    Royetta Asal, PharmD, BCPS 06/23/2020 1:18 AM

## 2020-06-23 NOTE — Progress Notes (Signed)
[facility]) hospitalized hospice patient note.  This patient is a current hospice patient with a terminal diagnosis of Cerebrovascular atherosclerosis.  Family activated EMS because patient developed nausea/vomiting and pain. Family notified hospice after patient was transported to the hospital. Patient was admitted on 06/21/2020 with a diagnosis of severe sepsis. Per Dr. Orpah Melter with [facility], this is a related hospital admission.   Patient continues to confused more than baseline. He has had 2 bowel movements in during the night and early morning. The plan is to start him on clear liquids today as tolerated.   VS: 98, 122/68, 77, 18, 99%RA I/O: 0/400  Abnormal Labs: Results for JEORGE, REISTER (MRN [id number]) as of 06/23/2020 11:52 06/23/2020 05:09 Potassium: 2.8 (L) Glucose: 112 (H) BUN: 30 (H) Creatinine: 1.96 (H) Calcium: 8.2 (L) GFR, Est Non African American: 28 (L) GFR, Est African American: 32 (L) RBC: 3.61 (L) Hemoglobin: 9.5 (L) HCT: 30.1 (L)  Monocyte #: 1.1 (H) Glucose: 112 (H)  Diagnostics: DG Abd. FINDINGS: Overall stool burden has significantly decreased. There is a moderate amount of stool remaining in the rectum and in the predominantly proximal colon. Gas is present in nondilated loops of small bowel without evidence of obstruction. No acute osseous abnormality is identified. IMPRESSION: Decreased stool burden.  IV/ PRN medications: Rocephin 1gm IVPB QD, K+ 64meq IVPB x 4 q hour. Zofran 4mg  IV PRN @ 0539, Tylenol 650mg  PO PRN @ 0536.   Problem List:  _ Severe sepsis with acute organ dysfunction due to UTI _ AKI on CKD stage III: _ Fecal Impaction _ Dementia with acute delirium: excessively somnolent   Discharge planning: Ongoing. [facility] will follow while hospitalized and coordinate with home care team for patient's discharge and return home.  Family Contact: As noted above, spoke with Meriel Pica, provided  support.   IDG: updated  Goals of Care: Patient is now a DNR. Return home with hospice support.   Should patient need ambulance transport at discharge, please use [facility] as they contract this service for all of our active hospice patients.   Please call with any hospice related questions.  Farrel Gordon, RN, CCM  [facility] Liaison (listed on [place] under Hospice/[facility])    [phone]

## 2020-06-23 NOTE — Progress Notes (Signed)
Patient had a medium sized bowel movement this a.m. Patient's daughter does not want him to have an enema and is requesting that Dulcolax pills be added to his medications.

## 2020-06-23 NOTE — Progress Notes (Signed)
PROGRESS NOTE  Marvin Bishop YTK:[id number] DOB: 18-Mar-1920 DOA: 06/21/2020 PCP: [name], MD  Brief History   Marvin Barns Sr. is a 86 y.o. male with medical history significant for dementia, insulin-dependent type 2 diabetes, history of PE on ASA 325 mg, hypertension, hyperlipidemia, CKD stage III, and prostate cancer who presents to the ED for evaluation of acute encephalopathy, nausea, vomiting, diarrhea.  History is limited from patient due to dementia and encephalopathy therefore majority of history is obtained from EDP, chart review, and daughter by phone.  Daughter states that patient is on complete bedrest at his baseline.  He lives at home with family and has daily aides come to the house.  2 days ago he had notable decreased urine output than expected with foul odor to his urine.  Urine output apparently improved the following day however earlier today he had an episode of chest pain followed by abdominal pain.  He developed nausea, vomiting, and several episodes of diarrhea.  He was brought to the ED for further evaluation.  ED Course:  Initial vitals showed BP 112/61, pulse 117, RR 17, temp 97.9 Fahrenheit, SPO2 96% on room air.  While in the ED SBP is as low as 83 with map 62, RR up to 28.  Labs show BUN 49, creatinine 3.14 (previously 1.76 on 03/26/2020), sodium 144, potassium 3.5, bicarb 24, serum glucose 202, LFTs within normal limits, WBC 18.2, hemoglobin 10.4, platelets 229,000, lactic acid 1.8 > 5.6, high-sensitivity troponin I 71 and 76.  Urinalysis showed negative nitrates, large leukocytes, 6-10 RBC/hpf, >50 WBC/hpf, many bacteria microscopy.  Blood and urine cultures were obtained and pending.  SARS-CoV-2 PCR is negative.  Abdominal x-ray showed stool burden suggestive of fecal impaction.  Portable chest x-ray was negative for focal consolidation, edema, or effusion.  CT abdomen/pelvis without contrast showed a severely distended urinary bladder with mild to  moderate bilateral collecting system dilatation to the level of the urinary bladder.  Large amount of stool throughout the colon especially at the level of the rectum is seen.  Cholelithiasis without acute inflammation also noted.  Patient was given 4 L LR and placed on maintenance fluids.  Empiric antibiotics with cefepime and Flagyl were started.  The hospitalist service was consulted to admit for further evaluation management.  In the first day the patient was very nauseated and unable to tolerate oral medications. Multiple home medications have been held both due to the patient's inability to tolerate PO and due to his hypotension on admission. When possible IV medications have been substituted.   The patient underwent an enema on 06/22/2020 with a moderate stool resulting. He was given bisacodyl suppository bid with results. S-ray of the abdomen this morning demonstrated a significant decrease in stool burden. However, the patient continues to have a moderate amount of stool in the proximal bowel and the rectum with a large amount of gas.  Consultants  . Hospice  Procedures  . Enema  Antibiotics   Anti-infectives (From admission, onward)   Start     Dose/Rate Route Frequency Ordered Stop   06/21/20 2200  cefTRIAXone (ROCEPHIN) 1 g in sodium chloride 0.9 % 100 mL IVPB        1 g 200 mL/hr over 30 Minutes Intravenous Every 24 hours 06/21/20 2018     06/21/20 1530  ceFEPIme (MAXIPIME) 2 g in sodium chloride 0.9 % 100 mL IVPB        2 g 200 mL/hr over 30 Minutes Intravenous  Once 06/21/20 1521 06/21/20 1635   06/21/20 1530  metroNIDAZOLE (FLAGYL) IVPB 500 mg        500 mg 100 mL/hr over 60 Minutes Intravenous  Once 06/21/20 1521 06/21/20 1635     Subjective  The patient is sleeping comfortably. His daughter, Marvin Bishop, is at bedside.  Objective   Vitals:  Vitals:   06/23/20 0408 06/23/20 1522  BP: 122/68 133/67  Pulse: 77 86  Resp: 18 (!) 22  Temp: 98 F (36.7 C) 98.7 F (37.1  C)  SpO2: 99% 97%    Exam:  Constitutional:  . The patient is sleeping. He is not awakened. No acute distress. Respiratory:  . No increased work of breathing. . No wheezes, rales, or rhonchi . No tactile fremitus Cardiovascular:  . Regular rate and rhythm . No murmurs, ectopy, or gallups. . No lateral PMI. No thrills. Abdomen:  . Abdomen is non-tender and much less distended. . No hernias, masses, or organomegaly . Hypoactive bowel sounds.  Musculoskeletal:  . No cyanosis, clubbing, or edema Skin:  . No rashes, lesions, ulcers . palpation of skin: no induration or nodules Neurologic:  Unable to evaluate as the patient is unable to cooperate with exam. Psychiatric:  . Unable to evaluate as the patient is unable to cooperate with exam. I have personally reviewed the following:   Today's Data  . Vitals, BMP, CBC, Glucose  Micro Data  .  o Blood cultures x 2: No growth o Urine Culture: No growth  Imaging  . CT Abdomen and Pelvis:   o Distended urinary bladder o Fecal impaction   Cardiology Data  . EKG: Sinus tachycardia  Scheduled Meds: . erythromycin  1 application Right Eye QHS  . heparin  5,000 Units Subcutaneous Q8H  . insulin aspart  0-9 Units Subcutaneous Q4H  . pantoprazole (PROTONIX) IV  40 mg Intravenous Q24H  . sodium chloride flush  3 mL Intravenous Q12H   Continuous Infusions: . cefTRIAXone (ROCEPHIN)  IV 1 g (06/23/20 0123)    Principal Problem:   Severe sepsis with acute organ dysfunction (HCC) Active Problems:   Hypertension associated with diabetes (HCC)   Insulin dependent type 2 diabetes mellitus ([place])   Sepsis due to urinary tract infection (HCC)   Acute renal failure superimposed on stage 3b chronic kidney disease (HCC)   Fecal impaction (HCC)   Hyperlipidemia associated with type 2 diabetes mellitus ([place])   Counseling regarding advance care planning and goals of care   Severe sepsis ([place])   LOS: 2 days   A & P  Severe sepsis  with acute organ dysfunction due to UTI: Patient presenting with tachycardia to 117, tachypnea with RR up to 28, leukocytosis 18.2 and evidence of endorgan dysfunction with acute kidney injury creatinine 3.14, lactic acidosis 5.6, and hypotension with SBP as low as 83 with map 62.  Urinalysis is suggestive of UTI, likely due to retention/obstruction possibly from fecal impaction.  Patient was initially given IV cefepime and Flagyl. This has been narrowed to IV Ceftriaxone. 1/2 blood cultures collected on 06/21/2020 have grown out staph epidermidis. The blood cultures will be repeated as this is likely a contaminate. Urine culture collected on the same date has poly microbial results. We will repeat this culture as well. .   Urinary retention: Foley in place. Will start flomax with blood pressures will allow.   Lactic Acidosis: 5.6 in ED. Down to 1.3 this morning with IV fluid resuscitation.  AKI on CKD stage III: In setting  of sepsis and likely urinary retention.  Anticipate improvement after Foley catheter placement with good urine output.  Improved somewhat this morning. Follow creatinine, electrolytes and volume status. Creatinine is decreased from 3/14 on admission to 1.80 today.  Hypokalemia: Supplement and monitor.   Fecal impaction: Stool burden is significantly improved following suppositories and enema yesterday. Still with moderate stool in proximal bowel and rectum. There is also a lot of gaseous distention of bowel. Continue miralax and oral stool softeners  Insulin-dependent type 2 diabetes: Daughter reports patient has been occasionally becoming hypoglycemic at home.  Hold home meds and place on sensitive SSI for now. Glucoses have run from 83 - 165.  History of PE: Previously on Eliquis, now on aspirin 325 mg daily.  Resume when awake enough to take meds. Will restart eliquis today.  Hypertension: Normotensive without antihypertensives. Monitor.  Was hypotensive on arrival,  improving with fluids. Monitor.  Hyperlipidemia: Resume statin when more awake.  Dementia with acute delirium: Excessively somnolent in the setting of severe sepsis.  Continue treatment of infection as above.  Goals of care: The patient's daughter Bobbye Morton was at the patient's bedside.  She states that he is following with hospice care now.  We discussed that in the event of cardiac arrest he would be unlikely to have good outcome with aggressive measures such as CPR or intubation.  She agrees that his CODE STATUS should be DO NOT RESUSCITATE.  Hospice is following. I appreciate their assistance.  DVT prophylaxis: Eliquis Code Status: DNR Family Communication: Discussed with patient's daughter Marvin Bishop present at bedside. Disposition Plan: From home and likely discharge to home with or without hospice Consults called: None Admission status:  Status is: Inpatient  Remains inpatient appropriate because:Hemodynamically unstable, IV treatments appropriate due to intensity of illness or inability to take PO and Inpatient level of care appropriate due to severity of illness   Dispo: The patient is from: Home  Anticipated d/c is to: Home  Anticipated d/c date is: 3 days  Patient currently is not medically stable to d/c.   Corwyn Vora, DO [facility] Direct contact: see [email or web address]  7PM-7AM contact night coverage as above 06/23/2020, 5:38 PM  LOS: 1 day

## 2020-06-23 NOTE — Progress Notes (Signed)
Patient had large, formed bowel movement at 23:00. Daughter at bedside.

## 2020-06-23 NOTE — Progress Notes (Signed)
Lab in to draw ordered BMP. Patient has not yet received 4 runs of Potassium ordered earlier due to intolerance at normal rate and infiltrated IV. Md notified and would like for BMP to be drawn after all 4 runs are completed. Will reschedule BMP for 2 hours after final run is complete. Eulas Post, RN

## 2020-06-24 LAB — BASIC METABOLIC PANEL
Anion gap: 12 (ref 5–15)
BUN: 20 mg/dL (ref 8–23)
CO2: 27 mmol/L (ref 22–32)
Calcium: 8.3 mg/dL — ABNORMAL LOW (ref 8.9–10.3)
Chloride: 105 mmol/L (ref 98–111)
Creatinine, Ser: 1.52 mg/dL — ABNORMAL HIGH (ref 0.61–1.24)
GFR calc Af Amer: 44 mL/min — ABNORMAL LOW (ref >60)
GFR calc non Af Amer: 38 mL/min — ABNORMAL LOW (ref >60)
Glucose, Bld: 137 mg/dL — ABNORMAL HIGH (ref 70–99)
Potassium: 2.9 mmol/L — ABNORMAL LOW (ref 3.5–5.1)
Sodium: 144 mmol/L (ref 135–145)

## 2020-06-24 LAB — CULTURE, BLOOD (ROUTINE X 2): Special Requests: ADEQUATE

## 2020-06-24 LAB — GLUCOSE, CAPILLARY
Glucose-Capillary: 125 mg/dL — ABNORMAL HIGH (ref 70–99)
Glucose-Capillary: 124 mg/dL — ABNORMAL HIGH (ref 70–99)
Glucose-Capillary: 170 mg/dL — ABNORMAL HIGH (ref 70–99)
Glucose-Capillary: 153 mg/dL — ABNORMAL HIGH (ref 70–99)

## 2020-06-24 LAB — MAGNESIUM: Magnesium: 1.9 mg/dL (ref 1.7–2.4)

## 2020-06-24 MED ORDER — MAGNESIUM OXIDE 400 (241.3 MG) MG PO TABS
400.0000 mg | ORAL_TABLET | Freq: Two times a day (BID) | ORAL | Status: DC
  Administered 2020-06-24 – 2020-06-25 (×4): 400 mg via ORAL
  Filled 2020-06-24 (×4): qty 1

## 2020-06-24 MED ORDER — FUROSEMIDE 20 MG PO TABS
20.0000 mg | ORAL_TABLET | Freq: Every day | ORAL | Status: DC
  Administered 2020-06-24: 20 mg via ORAL
  Filled 2020-06-24: qty 1

## 2020-06-24 MED ORDER — LORAZEPAM 0.5 MG PO TABS: 0.5000 mg | ORAL_TABLET | Freq: Four times a day (QID) | ORAL | Status: DC | PRN

## 2020-06-24 MED ORDER — MORPHINE SULFATE (PF) 2 MG/ML IV SOLN
0.5000 mg | Freq: Once | INTRAVENOUS | Status: AC
  Administered 2020-06-24: 0.5 mg via INTRAVENOUS
  Filled 2020-06-24: qty 1

## 2020-06-24 MED ORDER — GABAPENTIN 100 MG PO CAPS
200.0000 mg | ORAL_CAPSULE | Freq: Every day | ORAL | Status: DC
  Administered 2020-06-24 – 2020-06-25 (×2): 200 mg via ORAL
  Filled 2020-06-24 (×2): qty 2

## 2020-06-24 MED ORDER — ASPIRIN 325 MG PO TABS
325.0000 mg | ORAL_TABLET | Freq: Every day | ORAL | Status: DC
  Administered 2020-06-24 – 2020-06-25 (×2): 325 mg via ORAL
  Filled 2020-06-24 (×2): qty 1

## 2020-06-24 MED ORDER — TRAMADOL HCL 50 MG PO TABS
50.0000 mg | ORAL_TABLET | Freq: Every day | ORAL | Status: DC | PRN
  Administered 2020-06-24: 50 mg via ORAL
  Filled 2020-06-24: qty 1

## 2020-06-24 MED ORDER — MEMANTINE HCL 10 MG PO TABS
10.0000 mg | ORAL_TABLET | Freq: Two times a day (BID) | ORAL | Status: DC
  Administered 2020-06-24 – 2020-06-25 (×4): 10 mg via ORAL
  Filled 2020-06-24 (×4): qty 1

## 2020-06-24 MED ORDER — POTASSIUM CHLORIDE 10 MEQ/100ML IV SOLN
10.0000 meq | INTRAVENOUS | Status: AC
  Administered 2020-06-24 (×2): 10 meq via INTRAVENOUS
  Filled 2020-06-24 (×3): qty 100

## 2020-06-24 MED ORDER — SIMVASTATIN 40 MG PO TABS
40.0000 mg | ORAL_TABLET | Freq: Every morning | ORAL | Status: DC
  Administered 2020-06-24: 40 mg via ORAL
  Filled 2020-06-24: qty 1

## 2020-06-24 MED ORDER — TAMSULOSIN HCL 0.4 MG PO CAPS
0.4000 mg | ORAL_CAPSULE | Freq: Every day | ORAL | Status: DC
  Administered 2020-06-24 – 2020-06-25 (×2): 0.4 mg via ORAL
  Filled 2020-06-24 (×2): qty 1

## 2020-06-24 MED ORDER — PANTOPRAZOLE SODIUM 40 MG PO TBEC: 40.0000 mg | DELAYED_RELEASE_TABLET | Freq: Every day | ORAL | Status: DC

## 2020-06-24 MED ORDER — POTASSIUM CHLORIDE 10 MEQ/100ML IV SOLN
10.0000 meq | INTRAVENOUS | Status: AC
  Administered 2020-06-24 (×2): 10 meq via INTRAVENOUS
  Filled 2020-06-24 (×2): qty 100

## 2020-06-24 NOTE — Progress Notes (Signed)
Orthopedic Tech Progress Note Patient Details:  Marvin Bishop Sr. 08-10-1920 [id number]  Ortho Devices Ortho Device/Splint Location: Trapeze bar Ortho Device/Splint Interventions: Application   Post Interventions Patient Tolerated: Well Instructions Provided: Care of device   [place] Pink 06/24/2020, 6:09 PM

## 2020-06-24 NOTE — Progress Notes (Signed)
Manufacturing engineer [facility]) Hospitalized Hospice Patient   Mr. Siegmann is a current hospice patient with a terminal diagnosis cerebrovascular atherosclerosis.Family activated EMS because patient developed nausea/vomiting and pain. Family notified hospice after patient was transported to the hospital. Patient was admitted on 06/21/2020 with a diagnosis of severe sepsis with multi-organ failure. This is a related hospital admission per Dr. Tomasa Hosteller with [facility].   Pt alert and oriented, non-verbal but occasionally smiles during conversation. Daughter Neoma Laming at the bedside. No complaints from pt or family, they are please with the care Mr. Coye is receiving. Plan is to d/c back to his home once medically optimized, likely Sunday per Neoma Laming.   V/S:  98.6 oral, 118/69, HR 70, RR 16, SPO2 98% on RA I&O: 0/1525 Labs: K 2.9, Cr 1.52, GFR 44 IVs/PRNs: rocephin 1 g IV QD, potassium chloride 10 mEq IV x 4  Problem List: - sepsis - felt to be urosepsis secondary to fecal impaction causing urinary onstruction; VSS; BCx being repeated due to likely contamination; rocephin as above; foley in place; flomax started - hypokalemia - repleted with 4 runs - AKI on CKD - Cr 3.14 >> 1.52  GOC:  Clear, treat urosepsis and d/c home once medically optimized; family has agreed to DNR status D/C plans: d/d home, he lives alone with 24/7 caregivers, dtrs close by Family: present and updated IDT:  Hospice team updated  Once ready to discharge, please use [facility] for transport as they contract this service for our active hospice patients.  Venia Carbon RN, BSN, [facility] Liaison

## 2020-06-24 NOTE — Progress Notes (Signed)
PROGRESS NOTE  Marvin Bishop TKZ:[id number] DOB: 09/03/1920 DOA: 06/21/2020 PCP: [name], MD  Brief History   Marvin Barns Sr. is a 86 y.o. male with medical history significant for dementia, insulin-dependent type 2 diabetes, history of PE on ASA 325 mg, hypertension, hyperlipidemia, CKD stage III, and prostate cancer who presents to the ED for evaluation of acute encephalopathy, nausea, vomiting, diarrhea.  History is limited from patient due to dementia and encephalopathy therefore majority of history is obtained from EDP, chart review, and daughter by phone.  Daughter states that patient is on complete bedrest at his baseline.  He lives at home with family and has daily aides come to the house.  2 days ago he had notable decreased urine output than expected with foul odor to his urine.  Urine output apparently improved the following day however earlier today he had an episode of chest pain followed by abdominal pain.  He developed nausea, vomiting, and several episodes of diarrhea.  He was brought to the ED for further evaluation.  ED Course:  Initial vitals showed BP 112/61, pulse 117, RR 17, temp 97.9 Fahrenheit, SPO2 96% on room air.  While in the ED SBP is as low as 83 with map 62, RR up to 28.  Labs show BUN 49, creatinine 3.14 (previously 1.76 on 03/26/2020), sodium 144, potassium 3.5, bicarb 24, serum glucose 202, LFTs within normal limits, WBC 18.2, hemoglobin 10.4, platelets 229,000, lactic acid 1.8 > 5.6, high-sensitivity troponin I 71 and 76.  Urinalysis showed negative nitrates, large leukocytes, 6-10 RBC/hpf, >50 WBC/hpf, many bacteria microscopy.  Blood and urine cultures were obtained and pending.  SARS-CoV-2 PCR is negative.  Abdominal x-ray showed stool burden suggestive of fecal impaction.  Portable chest x-ray was negative for focal consolidation, edema, or effusion.  CT abdomen/pelvis without contrast showed a severely distended urinary bladder with mild to  moderate bilateral collecting system dilatation to the level of the urinary bladder.  Large amount of stool throughout the colon especially at the level of the rectum is seen.  Cholelithiasis without acute inflammation also noted.  Patient was given 4 L LR and placed on maintenance fluids.  Empiric antibiotics with cefepime and Flagyl were started.  The hospitalist service was consulted to admit for further evaluation management.  In the first day the patient was very nauseated and unable to tolerate oral medications. Multiple home medications have been held both due to the patient's inability to tolerate PO and due to his hypotension on admission. When possible IV medications have been substituted.   The patient underwent an enema on 06/22/2020 with a moderate stool resulting. He was given bisacodyl suppository bid with results. S-ray of the abdomen this morning demonstrated a significant decrease in stool burden. However, the patient continues to have a moderate amount of stool in the proximal bowel and the rectum with a large amount of gas.  The patient is frequendly spitting out oral medications. Family is demanding IV narcotics for patien't abdominal pain this evening.  Consultants  . Hospice  Procedures  . Enema  Antibiotics   Anti-infectives (From admission, onward)   Start     Dose/Rate Route Frequency Ordered Stop   06/21/20 2200  cefTRIAXone (ROCEPHIN) 1 g in sodium chloride 0.9 % 100 mL IVPB        1 g 200 mL/hr over 30 Minutes Intravenous Every 24 hours 06/21/20 2018     06/21/20 1530  ceFEPIme (MAXIPIME) 2 g in sodium chloride 0.9 %  100 mL IVPB        2 g 200 mL/hr over 30 Minutes Intravenous  Once 06/21/20 1521 06/21/20 1635   06/21/20 1530  metroNIDAZOLE (FLAGYL) IVPB 500 mg        500 mg 100 mL/hr over 60 Minutes Intravenous  Once 06/21/20 1521 06/21/20 1635     Subjective  The patient is sleeping comfortably. His daughter, Marvin Bishop, is at bedside.  Objective    Vitals:  Vitals:   06/24/20 0428 06/24/20 1315  BP: 128/66 118/69  Pulse: 94 70  Resp: 18   Temp: 97.8 F (36.6 C) 98.6 F (37 C)  SpO2: 98% 92%    Exam:  Constitutional:  . The patient is awake and alert. No acute distress. Respiratory:  . No increased work of breathing. . No wheezes, rales, or rhonchi . No tactile fremitus Cardiovascular:  . Regular rate and rhythm . No murmurs, ectopy, or gallups. . No lateral PMI. No thrills. Abdomen:  . Abdomen is non-tender and much less distended. . No hernias, masses, or organomegaly . Hypoactive bowel sounds.  Musculoskeletal:  . No cyanosis, clubbing, or edema Skin:  . No rashes, lesions, ulcers . palpation of skin: no induration or nodules Neurologic:  Unable to evaluate as the patient is unable to cooperate with exam. Psychiatric:  . Unable to evaluate as the patient is unable to cooperate with exam. I have personally reviewed the following:   Today's Data  . Vitals, BMP, CBC, Glucose  Micro Data  .  o Blood cultures x 2: No growth o Urine Culture: No growth  Imaging  . CT Abdomen and Pelvis:   o Distended urinary bladder o Fecal impaction   Cardiology Data  . EKG: Sinus tachycardia  Scheduled Meds: . aspirin  325 mg Oral Daily  . erythromycin  1 application Right Eye QHS  . furosemide  20 mg Oral Daily  . gabapentin  200 mg Oral QHS  . heparin  5,000 Units Subcutaneous Q8H  . insulin aspart  0-9 Units Subcutaneous Q4H  . magnesium oxide  400 mg Oral BID  . memantine  10 mg Oral BID  . pantoprazole (PROTONIX) IV  40 mg Intravenous Q24H  . polyethylene glycol  17 g Oral BID  . simvastatin  40 mg Oral q morning - 10a  . sodium chloride flush  3 mL Intravenous Q12H  . tamsulosin  0.4 mg Oral QHS   Continuous Infusions: . cefTRIAXone (ROCEPHIN)  IV 1 g (06/23/20 2125)  . potassium chloride 10 mEq (06/24/20 1825)    Principal Problem:   Severe sepsis with acute organ dysfunction (HCC) Active  Problems:   Hypertension associated with diabetes ([place])   Insulin dependent type 2 diabetes mellitus ([place])   Sepsis due to urinary tract infection (HCC)   Acute renal failure superimposed on stage 3b chronic kidney disease (HCC)   Fecal impaction (HCC)   Hyperlipidemia associated with type 2 diabetes mellitus ([place])   Counseling regarding advance care planning and goals of care   Severe sepsis ([place])   LOS: 3 days   A & P  Severe sepsis with acute organ dysfunction due to UTI: Patient presenting with tachycardia to 117, tachypnea with RR up to 28, leukocytosis 18.2 and evidence of endorgan dysfunction with acute kidney injury creatinine 3.14, lactic acidosis 5.6, and hypotension with SBP as low as 83 with map 62.  Urinalysis is suggestive of UTI, likely due to retention/obstruction possibly from fecal impaction.  Patient was initially  given IV cefepime and Flagyl. This has been narrowed to IV Ceftriaxone. 1/2 blood cultures collected on 06/21/2020 have grown out staph epidermidis. The blood cultures will be repeated as this is likely a contaminate. Urine culture collected on the same date has poly microbial results. We will repeat this culture as well. Both urine culture and repeat blood cultures have had no growth.  Positive blood culture: 1/2 from 06/21/2020 cultures grew out staph epidermidis. Contaminant. Repeat culture has had no growth.  Urinary retention: Foley in place. Will start flomax with blood pressures will allow.   Lactic Acidosis: Resolved. 5.6 in ED. Down to 1.3 this morning with IV fluid resuscitation.  AKI on CKD stage III: In setting of sepsis and likely urinary retention.  Anticipate improvement after Foley catheter placement with good urine output.  Improved somewhat this morning. Follow creatinine, electrolytes and volume status. Creatinine is decreased from 3.14 on admission to 1.52 today. Monitor.  Hypokalemia: 2.9 again this am. Supplement and monitor.   Fecal  impaction: Stool burden is significantly improved following suppositories and enema yesterday. Still with moderate stool in proximal bowel and rectum. There is also a lot of gaseous distention of bowel. Continue miralax and oral stool softeners  Insulin-dependent type 2 diabetes: Daughter reports patient has been occasionally becoming hypoglycemic at home.  Hold home meds and place on sensitive SSI for now. Glucoses have run from 83 - 165.  History of PE: Previously on Eliquis, now on aspirin 325 mg daily. Eliquis has been resumed.   Hypertension: Normotensive without antihypertensives. Monitor.  Was hypotensive on arrival, improving with fluids. Monitor.  Hyperlipidemia: Resume statin when more awake.  Dementia with acute delirium: Excessively somnolent in the setting of severe sepsis.  Continue treatment of infection as above. Pt is spitting out many PO meds.  Goals of care: The patient's daughter Marvin Bishop was at the patient's bedside.  She states that he is following with hospice care now.  We discussed that in the event of cardiac arrest he would be unlikely to have good outcome with aggressive measures such as CPR or intubation.  She agrees that his CODE STATUS should be DO NOT RESUSCITATE.  Hospice is following. I appreciate their assistance.  DVT prophylaxis: Eliquis Code Status: DNR Family Communication: Discussed with patient's daughter Marvin Bishop present at bedside. Disposition Plan: From home and likely discharge to home with or without hospice Consults called: None Admission status:  Status is: Inpatient  Remains inpatient appropriate because:Hemodynamically unstable, IV treatments appropriate due to intensity of illness or inability to take PO and Inpatient level of care appropriate due to severity of illness  Dispo: The patient is from: Home  Anticipated d/c is to: Home  Anticipated d/c date is: 3 days  Patient currently is not  medically stable to d/c.  Tanishka Drolet, DO [facility] Direct contact: see [email or web address]  7PM-7AM contact night coverage as above 06/24/2020, 6:51 PM  LOS: 1 day

## 2020-06-24 NOTE — Progress Notes (Signed)
Chaplain encountered patient's daughter Marvin Bishop on [employer] coming to see father. She accompanied her to patient's room and provided spiritual care.  Patient's daughter Marvin Bishop has been caring for him at home.  Patient's wife died 63 years ago. Patient's daughter is breast cancer survivor.  Family is [place].  Chaplain offered prayer.  Rev. Tamsen Snider Pager [phone]

## 2020-06-25 LAB — BASIC METABOLIC PANEL
Anion gap: 11 (ref 5–15)
BUN: 14 mg/dL (ref 8–23)
CO2: 28 mmol/L (ref 22–32)
Calcium: 8.2 mg/dL — ABNORMAL LOW (ref 8.9–10.3)
Chloride: 106 mmol/L (ref 98–111)
Creatinine, Ser: 1.37 mg/dL — ABNORMAL HIGH (ref 0.61–1.24)
GFR calc Af Amer: 49 mL/min — ABNORMAL LOW (ref >60)
GFR calc non Af Amer: 43 mL/min — ABNORMAL LOW (ref >60)
Glucose, Bld: 110 mg/dL — ABNORMAL HIGH (ref 70–99)
Potassium: 2.7 mmol/L — CL (ref 3.5–5.1)
Sodium: 145 mmol/L (ref 135–145)

## 2020-06-25 LAB — GLUCOSE, CAPILLARY
Glucose-Capillary: 68 mg/dL — ABNORMAL LOW (ref 70–99)
Glucose-Capillary: 102 mg/dL — ABNORMAL HIGH (ref 70–99)
Glucose-Capillary: 89 mg/dL (ref 70–99)
Glucose-Capillary: 116 mg/dL — ABNORMAL HIGH (ref 70–99)
Glucose-Capillary: 188 mg/dL — ABNORMAL HIGH (ref 70–99)

## 2020-06-25 MED ORDER — SENNOSIDES-DOCUSATE SODIUM 8.6-50 MG PO TABS
1.0000 | ORAL_TABLET | Freq: Two times a day (BID) | ORAL | Status: DC
  Administered 2020-06-25: 1 via ORAL
  Filled 2020-06-25: qty 1

## 2020-06-25 MED ORDER — POTASSIUM CHLORIDE CRYS ER 20 MEQ PO TBCR
40.0000 meq | EXTENDED_RELEASE_TABLET | ORAL | Status: DC
  Administered 2020-06-25: 40 meq via ORAL
  Filled 2020-06-25: qty 2

## 2020-06-25 MED ORDER — HALOPERIDOL 2 MG PO TABS
2.0000 mg | ORAL_TABLET | Freq: Four times a day (QID) | ORAL | Status: DC | PRN
  Filled 2020-06-25: qty 1

## 2020-06-25 MED ORDER — CEFAZOLIN SODIUM-DEXTROSE 1-4 GM/50ML-% IV SOLN
1.0000 g | Freq: Three times a day (TID) | INTRAVENOUS | Status: DC
  Administered 2020-06-25 – 2020-06-26 (×3): 1 g via INTRAVENOUS
  Filled 2020-06-25 (×3): qty 50

## 2020-06-25 MED ORDER — TRAMADOL HCL 50 MG PO TABS
50.0000 mg | ORAL_TABLET | Freq: Four times a day (QID) | ORAL | Status: DC | PRN
  Administered 2020-06-25: 50 mg via ORAL
  Filled 2020-06-25: qty 1

## 2020-06-25 MED ORDER — OXYCODONE HCL 5 MG PO TABS: 5.0000 mg | ORAL_TABLET | Freq: Four times a day (QID) | ORAL | Status: DC | PRN

## 2020-06-25 MED ORDER — POTASSIUM CHLORIDE 10 MEQ/100ML IV SOLN
10.0000 meq | INTRAVENOUS | Status: AC
  Administered 2020-06-25 (×5): 10 meq via INTRAVENOUS
  Filled 2020-06-25 (×5): qty 100

## 2020-06-25 MED ORDER — LACTULOSE 10 GM/15ML PO SOLN
20.0000 g | Freq: Once | ORAL | Status: AC
  Administered 2020-06-25: 20 g via ORAL
  Filled 2020-06-25: qty 30

## 2020-06-25 MED ORDER — INSULIN ASPART 100 UNIT/ML ~~LOC~~ SOLN: 0.0000 [IU] | Freq: Three times a day (TID) | SUBCUTANEOUS | Status: DC

## 2020-06-25 NOTE — Progress Notes (Signed)
CRITICAL VALUE ALERT  Critical Value:  Potassium 2.7  Date & Time Notied:  06/25/20 0735  Provider Notified: Posey Pronto MD   Orders Received/Actions taken: Awaiting orders

## 2020-06-25 NOTE — Progress Notes (Signed)
Manufacturing engineer [facility]) Hospitalized Hospice Patient   Mr. Bonzo is a current hospice patient with a terminal diagnosis cerebrovascular atherosclerosis.Family activated EMS because patient developed nausea/vomiting and pain. Family notified hospice after patient was transported to the hospital. Patient was admitted on 06/21/2020 with a diagnosis of severe sepsis with multi-organ failure. This is a related hospital admission per Dr. Tomasa Hosteller with [facility].  Visited pt at bedside. Pt resting and comfortable. Daughter Neoma Laming at the bedside. No complaints from pt or family, they are pleased with the care Mr. Fant is receiving. Plan is to d/c back to his home once medically optimized, likely tomorrow (Sunday).   VS: 119/61, 63HR, RR20, O2 100%RA I&O:0/300 Labs: K 2.7, CRE 1.37, GFR 49, Ca 8.2 IV/PRN: Rocephin 1g Q24hr PRN-Oxy 5mg  Q6hr, Tramadol 50mg  Q6hr  Problem list: Severe sepsis with acute organ dysfunction due to UTI: Patient presenting with tachycardia to 117, tachypnea with RR up to 28, leukocytosis 18.2 and evidence of endorgan dysfunction with acute kidney injury creatinine 3.14, lactic acidosis 5.6, and hypotension with SBP as low as 83 with map 62. Urinalysis is suggestive of UTI, likely due to retention/obstruction possibly from fecal impaction. Patient was initially given IV cefepime and Flagyl. This has been narrowed to IV Ceftriaxone. 1/2 blood cultures collected on 06/21/2020 have grown out staph epidermidis. The blood cultures will be repeated as this is likely a contaminate. Urine culture collected on the same date has poly microbial results. We will repeat this culture as well. Both urine culture and repeat blood cultures have had no growth.  Positive blood culture: 1/2 from 06/21/2020 cultures grew out staph epidermidis. Contaminant. Repeat culture has had no growth.  Urinary retention: Foley in place. Will start flomax with blood pressures will allow.   Lactic Acidosis:  Resolved. 5.6 in ED. Down to 1.3 this morning with IV fluid resuscitation.  AKI on CKD stage III: In setting of sepsis and likely urinary retention. Anticipate improvement after Foley catheter placement with good urine output. Improved somewhat this morning. Follow creatinine, electrolytes and volume status. Creatinine is decreased from 3.14 on admission to 1.52 today. Monitor.  Hypokalemia: 2.9 again this am. Supplement and monitor.   Fecal impaction:Stool burden is significantly improved following suppositories and enema yesterday. Still with moderate stool in proximal bowel and rectum. There is also a lot of gaseous distention of bowel. Continue miralax and oral stool softeners  Insulin-dependent type 2 diabetes: Daughter reports patient has been occasionally becoming hypoglycemic at home. Hold home meds and place on sensitive SSI for now. Glucoses have run from 83 - 165.  History of PE: Previously on Eliquis, now on aspirin 325 mg daily. Eliquis has been resumed.   Hypertension: Normotensive without antihypertensives. Monitor. Was hypotensive on arrival, improving with fluids. Monitor.  Hyperlipidemia: Resume statin when more awake.  Dementiawith acute delirium: Excessively somnolent in the setting of severe sepsis. Continue treatment of infection as above. Pt is spitting out many PO meds.  GOC:  Clear, treat urosepsis and d/c home once medically optimized; family has agreed to DNR status D/C plans: d/d home, he lives alone with 24/7 caregivers, dtrs close by Family: present and updated IDT:  Hospice team updated  Once ready to discharge, please use [facility] for transport as they contract this service for our active hospice patients.  Clementeen Hoof, BSN, [facility] [place] [phone]

## 2020-06-25 NOTE — Progress Notes (Signed)
Patient's daughter [name] called and was given an update on her father.

## 2020-06-25 NOTE — TOC Progression Note (Signed)
Transition of Care (TOC) - Progression Note    Patient Details  Name: Marvin Bishop. MRN: [id number] Date of Birth: 1920-01-05  Transition of Care [facility]) CM/SW Contact  Purcell Mouton, RN Phone Number: 06/25/2020, 11:51 AM  Clinical Narrative:     Pt is active and will continue with [facility] at discharge. In house rep Delsa Sale, RN was called and is aware.        Expected Discharge Plan and Services                                                 Social Determinants of Health (SDOH) Interventions    Readmission Risk Interventions No flowsheet data found.

## 2020-06-25 NOTE — Progress Notes (Signed)
Pharmacy Antibiotic Note  Marvin Bishop. is a 86 y.o. male admitted on 06/21/2020 with bacteremia.  Pharmacy has been consulted for ancef dosing. Blood cx from 9/7 3/4 bottles staph epi.  Repeat BCx 9/9 no growth x 2 days.  Abx D#5 to transition CTX to Ancef Afebrile. WBC WNL (9/9) SCr improving   Plan: Ancef 1 gm IV q8h F/u repeat BCx results F/u renal fxn  Height: 6\' 1"  (185.4 cm) Weight: 92.4 kg (203 lb 11.3 oz) IBW/kg (Calculated) : 79.9  Temp (24hrs), Avg:98 F (36.7 C), Min:97.7 F (36.5 C), Max:98.2 F (36.8 C)  Recent Labs  Lab 06/21/20 1547 06/21/20 1547 06/21/20 1816 06/21/20 2112 06/22/20 0341 06/22/20 0341 06/22/20 1742 06/22/20 1742 06/22/20 2242 06/23/20 0509 06/23/20 1329 06/23/20 2305 06/24/20 0452 06/25/20 0527  WBC 18.2*  --   --   --  11.9*  --   --   --   --  9.1  --   --   --   --   CREATININE 3.14*   < >  --   --  2.23*   < > 2.24*   < >  --  1.96* 1.80* 1.64* 1.52* 1.37*  LATICACIDVEN 1.8   < > 5.6* 4.9* 3.0*  --  1.6  --  1.3  --   --   --   --   --    < > = values in this interval not displayed.    Estimated Creatinine Clearance: 34 mL/min (A) (by C-G formula based on SCr of 1.37 mg/dL (H)).    Allergies  Allergen Reactions  . Donepezil Nausea And Vomiting  . Hydrocodone Nausea And Vomiting  . Hydrocodone-Acetaminophen Nausea Only  . Penicillins Hives and Swelling    Did it involve swelling of the face/tongue/throat, SOB, or low BP? No Did it involve sudden or severe rash/hives, skin peeling, or any reaction on the inside of your mouth or nose? Yes Did you need to seek medical attention at a hospital or doctor's office? Yes When did it last happen?per daughter If all above answers are "NO", may proceed with cephalosporin use.    Antimicrobials this admission:  9/7 cefepime x1 97 CTX >9/11 9/11 ancef>> 9/7 flagyl x 1 Dose adjustments this admission:   Microbiology results:  9/8 BCx: R arm aerobic bottle Staph epi 9/8 L  arm both aerobic & anaerobic: staph epi 3/4 bottles staph epi. Originally called as 1/4 bottles and deemed contaminant 9/9 BCx2: ngtd 9/7 UCx: mult sp. F 9/7 salmonella neg  Thank you for allowing pharmacy to be a part of this patient's care.  Eudelia Bunch, Pharm.D 06/25/2020 1:49 PM

## 2020-06-25 NOTE — Progress Notes (Signed)
[facility] Progress Note  Patient: Marvin Bishop    OVZ:[id number]  DOA: 06/21/2020     Date of Service: the patient was seen and examined on 06/25/2020  Brief hospital course: Past medical history of dementia, IDDM, PE, HTN, HLD, CKD 3, prostate cancer.  Currently on hospice.  Presents with complaints of nausea and vomiting.  Found to have severe constipation as well as staph epidermis bacteremia and sepsis.  Currently receiving IV antibiotics. Currently plan is continue current care.  Assessment and Plan: 1.  Severe sepsis secondary to UTI and staph epidermis bacteremia, POA On presentation tachycardic, tachypneic with leukocytosis as well as hypotension and acute kidney injury as well as lactic acidosis. Currently sepsis physiology resolved. UA shows evidence of pyuria but culture grew multiple species. Blood cultures 3 out of 4 grew staph epidermis. Initially started on IV cefepime and Flagyl.  Currently on ceftriaxone.  Transition to cefazolin. Repeat cultures on 06/23/2020 are negative. Continue to monitor for now.  2.  Acute urinary retention Secondary to constipation. On Flomax. Foley catheter was inserted on admission.  Will discontinue Foley and monitor.  3.  Acute kidney injury on chronic kidney disease stage IIIa Combination of prerenal as well as post renal obstruction. Baseline renal function around 1.7. On presentation serum creatinine 3.4. Currently improving to 1.3. Stop IV fluid. Continue to hold diuresis. Monitor renal function.  4.  Severe constipation with fecal impaction Continue bowel regimen. Continue stool softener. Continue pain control.  5.  Type 2 diabetes mellitus, uncontrolled with hyperglycemia with renal dysfunction Patient is on Levemir at home as well as sliding scale. Currently only on sliding scale insulin. We will likely recommend to discontinue Levemir on discharge.  6.  Hypokalemia Refusing oral potassium. Treated with IV  potassium.  7.  History of PE Previously on Eliquis pretension currently on aspirin. Continue  8.  Essential hypertension. Chronic diastolic CHF. On Lasix at home.  Alternatively 40 and 80 mg every other day Blood pressure currently stable. Continue to hold hypertension medication.  9.  Dementia with acute delirium Hospice patient. Patient was transitioned to hospice outpatient. Currently remains on hospice. Prognosis remains guarded given patient's poor reserves and progressive dementia. Reasonably patient has been bedbound only and showing minimal oral intake likely getting close to end-of-life. Continue DNR. Continue hospice on discharge. We will provide as needed medication on discharge to family. As needed Haldol.  As needed Ativan.  10.  Diabetic neuropathy On gabapentin.  Continue.  Diet: Dysphagia 3 diet. DVT Prophylaxis:   heparin injection 5,000 Units Start: 06/21/20 2200    Advance goals of care discussion: DNR  Family Communication: family was present at bedside, at the time of interview.  The pt provided permission to discuss medical plan with the family. Opportunity was given to ask question and all questions were answered satisfactorily.   Disposition:  Status is: Inpatient  Remains inpatient appropriate because:IV treatments appropriate due to intensity of illness or inability to take PO   Dispo: The patient is from: Home              Anticipated d/c is to: Home              Anticipated d/c date is: 2 days              Patient currently is not medically stable to d/c.  Subjective: Continue to monitor. Severely constipated still. Reports abdominal pain. No nausea no vomiting.  Remains with minimal oral intake.  Spitting all his medications.  Physical Exam:  General: Appear in mild distress, no Rash; Oral Mucosa Clear, moist. no Abnormal Neck Mass Or lumps, Conjunctiva normal  Cardiovascular: S1 and S2 Present, no Murmur, Respiratory: good  respiratory effort, Bilateral Air entry present and CTA, no Crackles, no wheezes Abdomen: Bowel Sound present, Soft and no tenderness Extremities: no Pedal edema Neurology: alert and not oriented to time, place, and person affect flat in affect. no new focal deficit Gait not checked due to patient safety concerns  Vitals:   06/24/20 1315 06/24/20 2003 06/25/20 0401 06/25/20 1227  BP: 118/69 139/63 (!) 100/55 119/61  Pulse: 70 67 88 63  Resp:  18 17 20   Temp: 98.6 F (37 C) 98.2 F (36.8 C) 97.7 F (36.5 C) 98 F (36.7 C)  TempSrc: Oral Oral Oral Oral  SpO2: 92% 100% 100% 100%  Weight:      Height:        Intake/Output Summary (Last 24 hours) at 06/25/2020 1751 Last data filed at 06/25/2020 1503 Gross per 24 hour  Intake 393.7 ml  Output 2400 ml  Net -2006.3 ml   Filed Weights   06/22/20 2336  Weight: 92.4 kg    Data Reviewed: I have personally reviewed and interpreted daily labs, tele strips, imagings as discussed above. I reviewed all nursing notes, pharmacy notes, vitals, pertinent old records I have discussed plan of care as described above with RN and patient/family.  CBC: Recent Labs  Lab 06/21/20 1547 06/22/20 0341 06/23/20 0509  WBC 18.2* 11.9* 9.1  NEUTROABS 15.9*  --  6.4  HGB 10.4* 8.2* 9.5*  HCT 32.8* 25.2* 30.1*  MCV 82.8 82.4 83.4  PLT 229 251 630   Basic Metabolic Panel: Recent Labs  Lab 06/23/20 0509 06/23/20 1329 06/23/20 2305 06/24/20 0452 06/25/20 0527  NA 142 142 143 144 145  K 2.8* 2.8* 2.9* 2.9* 2.7*  CL 107 106 106 105 106  CO2 26 26 26 27 28   GLUCOSE 112* 190* 158* 137* 110*  BUN 30* 28* 22 20 14   CREATININE 1.96* 1.80* 1.64* 1.52* 1.37*  CALCIUM 8.2* 8.0* 8.4* 8.3* 8.2*  MG  --  1.9  --  1.9  --     Studies: No results found.  Scheduled Meds: . aspirin  325 mg Oral Daily  . erythromycin  1 application Right Eye QHS  . gabapentin  200 mg Oral QHS  . heparin  5,000 Units Subcutaneous Q8H  . insulin aspart  0-9 Units  Subcutaneous TID WC  . magnesium oxide  400 mg Oral BID  . memantine  10 mg Oral BID  . polyethylene glycol  17 g Oral BID  . senna-docusate  1 tablet Oral BID  . sodium chloride flush  3 mL Intravenous Q12H  . tamsulosin  0.4 mg Oral QHS   Continuous Infusions: .  ceFAZolin (ANCEF) IV Stopped (06/25/20 1534)  . potassium chloride 10 mEq (06/25/20 1751)   PRN Meds: acetaminophen **OR** acetaminophen, haloperidol, LORazepam, ondansetron **OR** ondansetron (ZOFRAN) IV, oxyCODONE, traMADol  Time spent: 35 minutes  Author: Berle Mull, MD [facility] 06/25/2020 5:51 PM  To reach On-call, see care teams to locate the attending and reach out via [email or web address]. Between 7PM-7AM, please contact night-coverage If you still have difficulty reaching the attending provider, please page the [facility] (Director on Call) for [facility] on amion for assistance.

## 2020-06-25 NOTE — Progress Notes (Signed)
Patient's CBG this am was 68. Insulin held and patient given a cup of coke

## 2020-06-26 ENCOUNTER — Inpatient Hospital Stay (HOSPITAL_COMMUNITY)

## 2020-06-26 DIAGNOSIS — Z515 Encounter for palliative care: Secondary | ICD-10-CM

## 2020-06-26 LAB — BASIC METABOLIC PANEL
Anion gap: 10 (ref 5–15)
BUN: 12 mg/dL (ref 8–23)
CO2: 26 mmol/L (ref 22–32)
Calcium: 8 mg/dL — ABNORMAL LOW (ref 8.9–10.3)
Chloride: 106 mmol/L (ref 98–111)
Creatinine, Ser: 1.21 mg/dL (ref 0.61–1.24)
GFR calc Af Amer: 57 mL/min — ABNORMAL LOW (ref >60)
GFR calc non Af Amer: 50 mL/min — ABNORMAL LOW (ref >60)
Glucose, Bld: 178 mg/dL — ABNORMAL HIGH (ref 70–99)
Potassium: 3.1 mmol/L — ABNORMAL LOW (ref 3.5–5.1)
Sodium: 142 mmol/L (ref 135–145)

## 2020-06-26 LAB — CULTURE, BLOOD (ROUTINE X 2)

## 2020-06-26 LAB — GLUCOSE, CAPILLARY
Glucose-Capillary: 170 mg/dL — ABNORMAL HIGH (ref 70–99)
Glucose-Capillary: 150 mg/dL — ABNORMAL HIGH (ref 70–99)

## 2020-06-26 LAB — MAGNESIUM: Magnesium: 1.8 mg/dL (ref 1.7–2.4)

## 2020-06-26 MED ORDER — SENNA 8.6 MG PO TABS: 1.0000 | ORAL_TABLET | Freq: Every day | ORAL | 0 refills | Status: AC | PRN

## 2020-06-26 MED ORDER — CEPHALEXIN 250 MG/5ML PO SUSR: 500.0000 mg | Freq: Two times a day (BID) | ORAL | 0 refills | Status: AC

## 2020-06-26 MED ORDER — POLYETHYLENE GLYCOL 3350 17 G PO PACK: 17.0000 g | PACK | Freq: Every day | ORAL | 0 refills | Status: AC

## 2020-06-26 MED ORDER — ONDANSETRON 4 MG PO TBDP: 4.0000 mg | ORAL_TABLET | Freq: Three times a day (TID) | ORAL | 0 refills | Status: AC | PRN

## 2020-06-26 MED ORDER — HALOPERIDOL LACTATE 2 MG/ML PO CONC: 2.0000 mg | Freq: Three times a day (TID) | ORAL | 0 refills | Status: AC | PRN

## 2020-06-26 MED ORDER — DOCUSATE SODIUM 100 MG PO CAPS: 100.0000 mg | ORAL_CAPSULE | Freq: Two times a day (BID) | ORAL | 0 refills | Status: AC

## 2020-06-26 MED ORDER — TRAMADOL HCL 50 MG PO TABS
50.0000 mg | ORAL_TABLET | Freq: Every day | ORAL | 0 refills | Status: AC | PRN
Start: 2020-06-26 — End: ?

## 2020-06-26 MED ORDER — BISACODYL 10 MG RE SUPP
10.0000 mg | Freq: Once | RECTAL | Status: AC
  Administered 2020-06-26: 10 mg via RECTAL
  Filled 2020-06-26: qty 1

## 2020-06-26 MED ORDER — PROMETHAZINE HCL 25 MG RE SUPP: 25.0000 mg | Freq: Four times a day (QID) | RECTAL | 0 refills | Status: AC | PRN

## 2020-06-26 MED ORDER — BISACODYL 10 MG RE SUPP: 10.0000 mg | RECTAL | 0 refills | Status: AC | PRN

## 2020-06-26 MED ORDER — CEFDINIR 300 MG PO CAPS
300.0000 mg | ORAL_CAPSULE | Freq: Two times a day (BID) | ORAL | Status: DC
  Filled 2020-06-26: qty 1

## 2020-06-26 MED ORDER — OLANZAPINE 5 MG PO TABS: 2.5000 mg | ORAL_TABLET | Freq: Every day | ORAL | Status: DC

## 2020-06-26 MED ORDER — GABAPENTIN 250 MG/5ML PO SOLN: 200.0000 mg | Freq: Every day | ORAL | Status: DC

## 2020-06-26 MED ORDER — LORAZEPAM 2 MG/ML PO CONC: 0.5000 mg | ORAL | 0 refills | Status: AC | PRN

## 2020-06-26 MED ORDER — OXYCODONE HCL 5 MG PO TABS: 5.0000 mg | ORAL_TABLET | Freq: Four times a day (QID) | ORAL | 0 refills | Status: AC | PRN

## 2020-06-26 MED ORDER — OLANZAPINE 2.5 MG PO TABS: 2.5000 mg | ORAL_TABLET | Freq: Every day | ORAL | 0 refills | Status: AC

## 2020-06-26 MED ORDER — CEPHALEXIN 250 MG/5ML PO SUSR: 500.0000 mg | Freq: Two times a day (BID) | ORAL | Status: DC

## 2020-06-26 NOTE — Progress Notes (Signed)
Manufacturing engineer Documentation  Liaison notified by MD that pt will dc back home today under hospice services.   Please send pt home with any new rxs and DNR paperwork.  Use EMS for transport home and [facility] will arrange follow up visit once pt has arrived home.   Thank you,  Tamera Punt  [facility] Liaison [phone]

## 2020-06-26 NOTE — Plan of Care (Signed)

## 2020-06-26 NOTE — Progress Notes (Signed)
DC instructions reviewed with daughter, [name], verbalized understanding, IV removed, AVS and DNR form placed in packet, [facility] contacted to transport patient home, address verified, personal belongings with family

## 2020-06-27 ENCOUNTER — Other Ambulatory Visit: Payer: Self-pay

## 2020-06-27 ENCOUNTER — Emergency Department (HOSPITAL_COMMUNITY)
Admission: EM | Admit: 2020-06-27 | Discharge: 2020-06-27 | Disposition: A | Discharge status: Home / Self Care | Payer: Medicare Other | Attending: Emergency Medicine | Admitting: Emergency Medicine

## 2020-06-27 DIAGNOSIS — E1169 Type 2 diabetes mellitus with other specified complication: Secondary | ICD-10-CM | POA: Insufficient documentation

## 2020-06-27 DIAGNOSIS — Z87891 Personal history of nicotine dependence: Secondary | ICD-10-CM | POA: Insufficient documentation

## 2020-06-27 DIAGNOSIS — R339 Retention of urine, unspecified: Secondary | ICD-10-CM | POA: Insufficient documentation

## 2020-06-27 DIAGNOSIS — I130 Hypertensive heart and chronic kidney disease with heart failure and stage 1 through stage 4 chronic kidney disease, or unspecified chronic kidney disease: Secondary | ICD-10-CM | POA: Insufficient documentation

## 2020-06-27 DIAGNOSIS — N183 Chronic kidney disease, stage 3 unspecified: Secondary | ICD-10-CM | POA: Diagnosis not present

## 2020-06-27 DIAGNOSIS — I499 Cardiac arrhythmia, unspecified: Secondary | ICD-10-CM | POA: Diagnosis not present

## 2020-06-27 DIAGNOSIS — Z79899 Other long term (current) drug therapy: Secondary | ICD-10-CM | POA: Diagnosis not present

## 2020-06-27 DIAGNOSIS — E1122 Type 2 diabetes mellitus with diabetic chronic kidney disease: Secondary | ICD-10-CM | POA: Insufficient documentation

## 2020-06-27 DIAGNOSIS — Z8546 Personal history of malignant neoplasm of prostate: Secondary | ICD-10-CM | POA: Insufficient documentation

## 2020-06-27 DIAGNOSIS — J45909 Unspecified asthma, uncomplicated: Secondary | ICD-10-CM | POA: Insufficient documentation

## 2020-06-27 DIAGNOSIS — R404 Transient alteration of awareness: Secondary | ICD-10-CM | POA: Diagnosis not present

## 2020-06-27 DIAGNOSIS — R410 Disorientation, unspecified: Secondary | ICD-10-CM | POA: Diagnosis not present

## 2020-06-27 DIAGNOSIS — F0390 Unspecified dementia without behavioral disturbance: Secondary | ICD-10-CM | POA: Diagnosis not present

## 2020-06-27 DIAGNOSIS — I5042 Chronic combined systolic (congestive) and diastolic (congestive) heart failure: Secondary | ICD-10-CM | POA: Insufficient documentation

## 2020-06-27 DIAGNOSIS — Z743 Need for continuous supervision: Secondary | ICD-10-CM | POA: Diagnosis not present

## 2020-06-27 NOTE — ED Notes (Signed)
Pts daughter at bedside  

## 2020-06-27 NOTE — Discharge Instructions (Signed)
Follow-up with hospice and urology as needed.

## 2020-06-27 NOTE — ED Triage Notes (Signed)
Pt BIBA from home.   Pt has home hospice care. Hospice RN attempted to place foley today, unsuccessful.  Sent to ED for further evaluation and potential catheter placement.   Hx dementia, mentating at baseline.

## 2020-06-27 NOTE — ED Provider Notes (Signed)
[place] DEPT Provider Note   CSN: [id number] Arrival date & time: 06/27/20  1304     History Chief Complaint  Patient presents with  . Urinary Retention    Marvin GAMBRELL Sr. is a 86 y.o. male. Level 5 caveat due to dementia. HPI Patient presents from home hospice.  Reportedly has not urinated since 2 days ago but was only discharged from the hospital yesterday.  Had a Foley catheter in place in the hospital.  Sent in because the nurse was unable to get a catheter there.  Patient is demented and can really not provide any history.    Past Medical History:  Diagnosis Date  . Arthritis   . Asthma    hx of as child  . Complete loss of vision    right eye  . Dementia ([place])   . Diabetes mellitus   . Headache(784.0)    slight  . Hypercholesteremia   . Hypertension   . Insomnia    occasional  . Nephrolithiasis    right UVJ stone  . Peripheral neuropathy    both legs  . PONV (postoperative nausea and vomiting)   . Prostate cancer [facility])     Patient Active Problem List   Diagnosis Date Noted  . Hospice care patient 06/26/2020  . Severe sepsis ([place]) 06/22/2020  . Severe sepsis with acute organ dysfunction ([place]) 06/21/2020  . Sepsis due to urinary tract infection ([place]) 06/21/2020  . Acute renal failure superimposed on stage 3b chronic kidney disease ([place]) 06/21/2020  . Fecal impaction ([place]) 06/21/2020  . Hyperlipidemia associated with type 2 diabetes mellitus ([place]) 06/21/2020  . Counseling regarding advance care planning and goals of care 06/21/2020  . Altered mental status, unspecified 11/14/2019  . Pre-ulcerative calluses 04/29/2019  . Hypophosphatemia 05/31/2017  . TIA (transient ischemic attack) 05/27/2017  . Sepsis ([place]) 05/27/2017  . CKD (chronic kidney disease), stage III 05/27/2017  . Influenza 11/08/2016  . Chronic combined systolic and diastolic congestive heart failure ([place]) 11/08/2016  . History of pulmonary embolus (PE)  11/08/2016  . PE (pulmonary embolism) 04/07/2015  . Pulmonary embolism ([place]) 04/07/2015  . Elevated troponin 04/07/2015  . Cough   . Pulmonary emboli ([place])   . Sinus tachycardia 04/01/2015  . Acid reflux 03/07/2015  . Insulin dependent type 2 diabetes mellitus ([place]) 03/07/2015  . Blind painful eye 01/07/2015  . Ileus ([place]) 08/23/2014  . Dementia without behavioral disturbance ([place]) 09/02/2013  . Renal calculus or stone 08/22/2013  . Hypokalemia 08/22/2013  . Hypertension associated with diabetes ([place]) 08/22/2013  . History of insulin dependent diabetes mellitus 08/22/2013  . Dehydration 08/22/2013  . Memory loss 04/23/2013  . Insomnia   . Carpal tunnel syndrome   . Diverticulosis     Past Surgical History:  Procedure Laterality Date  . APPENDECTOMY    . Cornea removed Right 02-26-15  . CYSTOSCOPY W/ URETERAL STENT PLACEMENT Right 10/01/2013   Procedure: cystoscopy right retrograde holmium laser lithotripsy;  Surgeon: Franchot Gallo, MD;  Location: [facility];  Service: Urology;  Laterality: Right;  holmium laser application  . HEMORROIDECTOMY    . KNEE ARTHROSCOPY Right   . PARS PLANA VITRECTOMY  10/24/2011   Procedure: PARS PLANA VITRECTOMY WITH 23 GAUGE;  Surgeon: Adonis Brook, MD;  Location: [facility];  Service: Ophthalmology;  Laterality: Right;  . TONSILLECTOMY  as teen       Family History  Problem Relation Age of Onset  . Cancer Mother  uterus  . Heart Problems Father   . Cancer Brother        stomach  . Cancer Brother        throat  . Colon cancer Brother     Social History   Tobacco Use  . Smoking status: Former Smoker    Packs/day: 0.25    Types: Cigarettes    Quit date: 10/15/1966    Years since quitting: 53.7  . Smokeless tobacco: Never Used  Substance Use Topics  . Alcohol use: Yes    Comment: occasional  . Drug use: No    Home Medications Prior to Admission medications   Medication Sig Start Date End Date Taking? Authorizing Provider    bisacodyl (DULCOLAX) 10 MG suppository Place 1 suppository (10 mg total) rectally as needed for moderate constipation. 06/26/20   Lavina Hamman, MD  cephALEXin (KEFLEX) 250 MG/5ML suspension Take 10 mLs (500 mg total) by mouth 2 (two) times daily for 5 days. 06/26/20 07/01/20  Lavina Hamman, MD  docusate sodium (COLACE) 100 MG capsule Take 1 capsule (100 mg total) by mouth 2 (two) times daily. 06/26/20 06/26/21  Lavina Hamman, MD  erythromycin ophthalmic ointment Place 1 application into the right eye at bedtime. 05/03/17   [provider]  esomeprazole (NEXIUM) 40 MG capsule Take 1 capsule (40 mg total) by mouth 2 (two) times daily before a meal. 04/04/15   Barton Dubois, MD  famotidine (PEPCID) 20 MG tablet Take 1 tablet (20 mg total) by mouth at bedtime. 04/04/15   Barton Dubois, MD  gabapentin (NEURONTIN) 100 MG capsule Take 200 mg by mouth at bedtime.  09/21/15   [provider]  haloperidol (HALDOL) 2 MG/ML solution Take 1 mL (2 mg total) by mouth every 8 (eight) hours as needed for agitation. 06/26/20   Lavina Hamman, MD  Loratadine (CLARITIN) 10 MG CAPS Take 10 mg by mouth daily after breakfast.     [provider]  LORazepam (LORAZEPAM INTENSOL) 2 MG/ML concentrated solution Take 0.3 mLs (0.6 mg total) by mouth every 4 (four) hours as needed for anxiety. 06/26/20   Lavina Hamman, MD  Naphazoline HCl (CLEAR EYES OP) Place 1 drop into the left eye at bedtime.    [provider]  OLANZapine (ZYPREXA) 2.5 MG tablet Take 1 tablet (2.5 mg total) by mouth at bedtime. 06/26/20   Lavina Hamman, MD  ondansetron (ZOFRAN ODT) 4 MG disintegrating tablet Take 1 tablet (4 mg total) by mouth every 8 (eight) hours as needed for nausea or vomiting. 06/26/20   Lavina Hamman, MD  ONE TOUCH ULTRA TEST test strip USE TO TEST TWICE A DAY 25 07/03/18   [provider]  oxyCODONE (OXY IR/ROXICODONE) 5 MG immediate release tablet Take 1 tablet (5 mg total) by mouth every  6 (six) hours as needed for moderate pain or severe pain. 06/26/20   Lavina Hamman, MD  polyethylene glycol (MIRALAX) 17 g packet Take 17 g by mouth daily. 06/26/20   Lavina Hamman, MD  promethazine (PHENERGAN) 25 MG suppository Place 1 suppository (25 mg total) rectally every 6 (six) hours as needed for refractory nausea / vomiting. 06/26/20 06/26/21  Lavina Hamman, MD  senna (SENOKOT) 8.6 MG TABS tablet Take 1 tablet (8.6 mg total) by mouth daily as needed for mild constipation. 06/26/20   Lavina Hamman, MD  tamsulosin (FLOMAX) 0.4 MG CAPS capsule Take 0.4 mg by mouth at bedtime. 05/31/20  [provider]  traMADol (ULTRAM) 50 MG tablet Take 1 tablet (50 mg total) by mouth daily as needed (mild). 06/26/20   Lavina Hamman, MD    Allergies    Donepezil, Hydrocodone, Hydrocodone-acetaminophen, and Penicillins  Review of Systems   Review of Systems  Unable to perform ROS: Dementia    Physical Exam Updated Vital Signs BP 127/69 (BP Location: Left Arm)   Pulse 79   Temp (!) 97.4 F (36.3 C) (Oral)   Resp 18   SpO2 98%   Physical Exam Vitals reviewed.  HENT:     Head: Atraumatic.  Eyes:     General: No scleral icterus. Cardiovascular:     Rate and Rhythm: Regular rhythm.  Pulmonary:     Breath sounds: No wheezing, rhonchi or rales.  Abdominal:     Palpations: There is mass.     Tenderness: There is abdominal tenderness.     Comments: Suprapubic mass up to around umbilicus.  Musculoskeletal:        General: No tenderness.  Skin:    General: Skin is warm.  Neurological:     Mental Status: He is alert.     Comments: Reportedly at baseline.     ED Results / Procedures / Treatments   Labs (all labs ordered are listed, but only abnormal results are displayed) Labs Reviewed - No data to display  EKG None  Radiology DG Abd Portable 1V  Result Date: 06/26/2020 CLINICAL DATA:  Constipation EXAM: X-RAY ABDOMEN 1 VIEW COMPARISON:  None 07/04/2020 FINDINGS:  Patchy airspace disease at the LEFT lung base slightly more pronounced than on the study of 06/23/2020 Stool and gas scattered throughout the colon. Moderately large amount of stool in the rectum. No abnormal calcifications. Skeletal structures on limited assessment without acute process. Spinal degenerative changes. Scattered loops of gas-filled small bowel similar to the previous exam. Two total images for coverage of the abdomen in supine position IMPRESSION: Moderately large amount of stool in the rectum, correlate with signs of fecal impaction. Scattered loops of gas-filled small bowel, could be seen in the setting of mild generalized ileus, no obstruction. Basilar airspace disease may represent atelectasis, increased from the prior. Electronically Signed   By: Zetta Bills M.D.   On: 06/26/2020 12:28    Procedures Procedures (including critical care time)  Medications Ordered in ED Medications - No data to display  ED Course  I have reviewed the triage vital signs and the nursing notes.  Pertinent labs & imaging results that were available during my care of the patient were reviewed by me and considered in my medical decision making (see chart for details).    MDM Rules/Calculators/A&P                          Patient with urinary retention.  Sent from hospice to get catheter placed.  Discussed with hospice did not need further work-up for just the procedure.  Catheter placed with improvement of tenderness.  Discharged back to hospice. Final Clinical Impression(s) / ED Diagnoses Final diagnoses:  Urinary retention    Rx / DC Orders ED Discharge Orders    None       Davonna Belling, MD 06/27/20 1430

## 2020-06-27 NOTE — ED Notes (Signed)
[facility] notified of pts need for transportation home.

## 2020-06-28 LAB — CULTURE, BLOOD (ROUTINE X 2)
Culture: NO GROWTH
Culture: NO GROWTH

## 2020-06-28 NOTE — Discharge Summary (Signed)
[facility] Discharge Summary   Patient: Marvin Bishop GEX:[id number]  PCP: [name], MD  Date of admission: 06/21/2020   Date of discharge: 06/26/2020     Discharge Diagnoses:   Principal Problem:   Severe sepsis with acute organ dysfunction [facility]) Active Problems:   Hypertension associated with diabetes ([place])   Insulin dependent type 2 diabetes mellitus ([place])   Sepsis due to urinary tract infection ([place])   Acute renal failure superimposed on stage 3b chronic kidney disease ([place])   Fecal impaction ([place])   Hyperlipidemia associated with type 2 diabetes mellitus ([place])   Counseling regarding advance care planning and goals of care   Severe sepsis [facility])   Hospice care patient   Admitted From: home Disposition:  Home   Recommendations for Outpatient Follow-up:  1. PCP: please follow up with PCP in 1 week 2. Follow up LABS/TEST:  none   Follow-up Information    [name], MD. Schedule an appointment as soon as possible for a visit in 1 week(s).   Specialty: Internal Medicine Contact information: [address], [address] [place] [address] [phone]              Diet recommendation: Cardiac diet  Activity: The patient is advised to gradually reintroduce usual activities, as tolerated  Discharge Condition: stable  Code Status: DNR   History of present illness: As per the H and P dictated on admission, "Marvin PATLAN Sr. is a 86 y.o. male with medical history significant for dementia, insulin-dependent type 2 diabetes, history of PE on ASA 325 mg, hypertension, hyperlipidemia, CKD stage III, and prostate cancer who presents to the ED for evaluation of acute encephalopathy, nausea, vomiting, diarrhea.  History is limited from patient due to dementia and encephalopathy therefore majority of history is obtained from EDP, chart review, and daughter by phone.  Daughter states that patient is on complete bedrest at his baseline.  He lives at  home with family and has daily aides come to the house.  2 days ago he had notable decreased urine output than expected with foul odor to his urine.  Urine output apparently improved the following day however earlier today he had an episode of chest pain followed by abdominal pain.  He developed nausea, vomiting, and several episodes of diarrhea.  He was brought to the ED for further evaluation."  Hospital Course:  Summary of his active problems in the hospital is as following. 1.  Severe sepsis secondary to UTI and staph epidermis bacteremia, POA On presentation tachycardic, tachypneic with leukocytosis as well as hypotension and acute kidney injury as well as lactic acidosis. Currently sepsis physiology resolved. UA shows evidence of pyuria but culture grew multiple species. Blood cultures 3 out of 4 grew staph epidermis. Initially started on IV cefepime and Flagyl. Later on ceftriaxone thenTransition to cefazolin. Repeat cultures on 06/23/2020 are negative. Comfort care now   2.  Acute urinary retention Secondary to constipation. On Flomax. Foley catheter was inserted on admission.   Successful voding trial   3.  Acute kidney injury on chronic kidney disease stage IIIa Combination of prerenal as well as post renal obstruction. Baseline renal function around 1.7. On presentation serum creatinine 3.4. Currently improving to 1.3. Stop IV fluid.  4.  Severe constipation with fecal impaction Continue bowel regimen. Continue stool softener. Continue pain control.  5.  Type 2 diabetes mellitus, uncontrolled with hyperglycemia with renal dysfunction Patient is on Levemir at home as well as sliding  scale. Currently only on sliding scale insulin. discontinue Levemir on discharge.  6.  Hypokalemia Refusing oral potassium. Treated with IV potassium.  7.  History of PE Previously on Eliquis pretension currently on aspirin. Continue  8.  Essential hypertension. Chronic  diastolic CHF. On Lasix at home.  Alternatively 40 and 80 mg every other day Blood pressure currently stable. Continue to hold hypertension medication.  9.  Dementia with acute delirium Hospice patient. Patient was transitioned to hospice outpatient. Currently remains on hospice. Prognosis remains guarded given patient's poor reserves and progressive dementia. Reasonably patient has been bedbound only and showing minimal oral intake likely getting close to end-of-life. Continue DNR. Continue hospice on discharge. Goal for comfort only We will provide as needed medication on discharge to family. As needed Haldol.  As needed Ativan.  10.  Diabetic neuropathy On gabapentin.  Continue.  On the day of the discharge the patient's vitals were stable, and no other acute medical condition were reported by patient. the patient was felt safe to be discharge at Home with hospice.  Consultants: none Procedures: none  Discharge Exam: General: Appear in mild distress, no Rash; Oral Mucosa Clear, moist. Cardiovascular: S1 and S2 Present, no Murmur, Respiratory: normal respiratory effort, Bilateral Air entry present and no Crackles, no wheezes Abdomen: Bowel Sound present, Soft and difficult to assess  tenderness, no hernia Extremities: no Pedal edema, no calf tenderness Neurology: alert and not oriented to time, place, and person affect emotionally labile.  Filed Weights   06/22/20 2336  Weight: 92.4 kg   Vitals:   06/26/20 0526 06/26/20 1253  BP: (!) 101/54 111/60  Pulse: 98 89  Resp: 18 (!) 22  Temp: 97.8 F (36.6 C) 97.7 F (36.5 C)  SpO2: 99% 99%    DISCHARGE MEDICATION: Allergies as of 06/26/2020      Reactions   Donepezil Nausea And Vomiting   Hydrocodone Nausea And Vomiting   Hydrocodone-acetaminophen Nausea Only   Penicillins Hives, Swelling   Did it involve swelling of the face/tongue/throat, SOB, or low BP? No Did it involve sudden or severe rash/hives, skin  peeling, or any reaction on the inside of your mouth or nose? Yes Did you need to seek medical attention at a hospital or doctor's office? Yes When did it last happen?per daughter If all above answers are "NO", may proceed with cephalosporin use.      Medication List    STOP taking these medications   aspirin 325 MG tablet   BD Pen Needle Nano U/F 32G X 4 MM Misc Generic drug: Insulin Pen Needle   ELDERTONIC PO   furosemide 40 MG tablet Commonly known as: LASIX   HumaLOG KwikPen 100 UNIT/ML KwikPen Generic drug: insulin lispro   Levemir FlexTouch 100 UNIT/ML FlexPen Generic drug: insulin detemir   LORazepam 0.5 MG tablet Commonly known as: ATIVAN Replaced by: LORazepam 2 MG/ML concentrated solution   magnesium oxide 400 MG tablet Commonly known as: MAG-OX   memantine 10 MG tablet Commonly known as: NAMENDA   simvastatin 40 MG tablet Commonly known as: ZOCOR     TAKE these medications   bisacodyl 10 MG suppository Commonly known as: Dulcolax Place 1 suppository (10 mg total) rectally as needed for moderate constipation.   cephALEXin 250 MG/5ML suspension Commonly known as: KEFLEX Take 10 mLs (500 mg total) by mouth 2 (two) times daily for 5 days. Notes to patient: 06/26/20   Claritin 10 MG Caps Generic drug: Loratadine Take 10 mg by mouth daily  after breakfast. Notes to patient: Placed on comfort care   CLEAR EYES OP Place 1 drop into the left eye at bedtime. Notes to patient: Placed on comfort care   docusate sodium 100 MG capsule Commonly known as: Colace Take 1 capsule (100 mg total) by mouth 2 (two) times daily. What changed:   how much to take  when to take this  reasons to take this Notes to patient: Placed on comfort care   erythromycin ophthalmic ointment Place 1 application into the right eye at bedtime. Notes to patient: Placed on comfort care   esomeprazole 40 MG capsule Commonly known as: NEXIUM Take 1 capsule (40 mg total)  by mouth 2 (two) times daily before a meal. Notes to patient: Placed on comfort care   famotidine 20 MG tablet Commonly known as: PEPCID Take 1 tablet (20 mg total) by mouth at bedtime. Notes to patient: Placed on comfort care   gabapentin 100 MG capsule Commonly known as: NEURONTIN Take 200 mg by mouth at bedtime. Notes to patient: Placed on comfort care   haloperidol 2 MG/ML solution Commonly known as: HALDOL Take 1 mL (2 mg total) by mouth every 8 (eight) hours as needed for agitation.   LORazepam 2 MG/ML concentrated solution Commonly known as: LORazepam Intensol Take 0.3 mLs (0.6 mg total) by mouth every 4 (four) hours as needed for anxiety. Replaces: LORazepam 0.5 MG tablet   OLANZapine 2.5 MG tablet Commonly known as: ZYPREXA Take 1 tablet (2.5 mg total) by mouth at bedtime. Notes to patient: 06/26/20   ondansetron 4 MG disintegrating tablet Commonly known as: Zofran ODT Take 1 tablet (4 mg total) by mouth every 8 (eight) hours as needed for nausea or vomiting.   ONE TOUCH ULTRA TEST test strip Generic drug: glucose blood USE TO TEST TWICE A DAY 25   oxyCODONE 5 MG immediate release tablet Commonly known as: Oxy IR/ROXICODONE Take 1 tablet (5 mg total) by mouth every 6 (six) hours as needed for moderate pain or severe pain.   polyethylene glycol 17 g packet Commonly known as: MiraLax Take 17 g by mouth daily. Notes to patient: Placed on comfort care   promethazine 25 MG suppository Commonly known as: Phenergan Place 1 suppository (25 mg total) rectally every 6 (six) hours as needed for refractory nausea / vomiting.   senna 8.6 MG Tabs tablet Commonly known as: SENOKOT Take 1 tablet (8.6 mg total) by mouth daily as needed for mild constipation.   tamsulosin 0.4 MG Caps capsule Commonly known as: FLOMAX Take 0.4 mg by mouth at bedtime. Notes to patient: Placed on comfort care   traMADol 50 MG tablet Commonly known as: ULTRAM Take 1 tablet (50 mg total)  by mouth daily as needed (mild). What changed: reasons to take this      Allergies  Allergen Reactions  . Donepezil Nausea And Vomiting  . Hydrocodone Nausea And Vomiting  . Hydrocodone-Acetaminophen Nausea Only  . Penicillins Hives and Swelling    Did it involve swelling of the face/tongue/throat, SOB, or low BP? No Did it involve sudden or severe rash/hives, skin peeling, or any reaction on the inside of your mouth or nose? Yes Did you need to seek medical attention at a hospital or doctor's office? Yes When did it last happen?per daughter If all above answers are "NO", may proceed with cephalosporin use.    Discharge Instructions    Diet general   Complete by: As directed    Increase activity slowly  Complete by: As directed       The results of significant diagnostics from this hospitalization (including imaging, microbiology, ancillary and laboratory) are listed below for reference.    Significant Diagnostic Studies: CT ABDOMEN PELVIS WO CONTRAST  Result Date: 06/21/2020 CLINICAL DATA:  Abdominal pain. EXAM: CT ABDOMEN AND PELVIS WITHOUT CONTRAST TECHNIQUE: Multidetector CT imaging of the abdomen and pelvis was performed following the standard protocol without IV contrast. COMPARISON:  CT dated April 01, 2015. FINDINGS: Lower chest: There is an opacity at the right lung base favored to represent an area of chronic atelectasis. There is a trace left-sided pleural effusion with adjacent atelectasis. The heart size is normal. Hepatobiliary: Again noted is a mass in the left hepatic lobe favored to represent a hemangioma. Cholelithiasis without acute inflammation.There is no biliary ductal dilation. Pancreas: Normal contours without ductal dilatation. No peripancreatic fluid collection. Spleen: Unremarkable. Adrenals/Urinary Tract: --Adrenal glands: Unremarkable. --Right kidney/ureter: There is mild right-sided collecting system dilatation to the level of the urinary bladder.  --Left kidney/ureter: There is moderate left-sided collecting system dilatation to the level of the urinary bladder. There are simple left renal cysts. --Urinary bladder: The urinary bladder is severely distended. Stomach/Bowel: --Stomach/Duodenum: No hiatal hernia or other gastric abnormality. Normal duodenal course and caliber. --Small bowel: Unremarkable. --Colon: There is a large amount of stool throughout the colon, especially at the level of the rectum. --Appendix: Not visualized. No right lower quadrant inflammation or free fluid. Vascular/Lymphatic: Atherosclerotic calcification is present within the non-aneurysmal abdominal aorta, without hemodynamically significant stenosis. --No retroperitoneal lymphadenopathy. --No mesenteric lymphadenopathy. --No pelvic or inguinal lymphadenopathy. Reproductive: The patient appears to be status post prior prostatectomy. Other: No ascites or free air. The abdominal wall is normal. Musculoskeletal. No acute displaced fractures. IMPRESSION: 1. Severely distended urinary bladder with mild to moderate bilateral collecting system dilatation to the level of the urinary bladder. 2. Large amount of stool throughout the colon, especially at the level of the rectum. 3. Cholelithiasis without acute inflammation. Aortic Atherosclerosis (ICD10-I70.0). Electronically Signed   By: Constance Holster M.D.   On: 06/21/2020 18:28   DG Abd 1 View  Result Date: 06/23/2020 CLINICAL DATA:  Constipation. EXAM: ABDOMEN - 1 VIEW COMPARISON:  06/21/2020 FINDINGS: Overall stool burden has significantly decreased. There is a moderate amount of stool remaining in the rectum and in the predominantly proximal colon. Gas is present in nondilated loops of small bowel without evidence of obstruction. No acute osseous abnormality is identified. IMPRESSION: Decreased stool burden. Electronically Signed   By: Logan Bores M.D.   On: 06/23/2020 09:38   DG Chest Port 1 View  Result Date:  06/21/2020 CLINICAL DATA:  Sepsis. EXAM: PORTABLE CHEST 1 VIEW COMPARISON:  November 14, 2019. FINDINGS: Stable cardiomediastinal silhouette. Lungs are clear. No pneumothorax or pleural effusion is noted. The visualized skeletal structures are unremarkable. IMPRESSION: No active disease. Electronically Signed   By: Marijo Conception M.D.   On: 06/21/2020 16:38   DG Abd Portable 1V  Result Date: 06/26/2020 CLINICAL DATA:  Constipation EXAM: X-RAY ABDOMEN 1 VIEW COMPARISON:  None 07/04/2020 FINDINGS: Patchy airspace disease at the LEFT lung base slightly more pronounced than on the study of 06/23/2020 Stool and gas scattered throughout the colon. Moderately large amount of stool in the rectum. No abnormal calcifications. Skeletal structures on limited assessment without acute process. Spinal degenerative changes. Scattered loops of gas-filled small bowel similar to the previous exam. Two total images for coverage of the abdomen in supine position  IMPRESSION: Moderately large amount of stool in the rectum, correlate with signs of fecal impaction. Scattered loops of gas-filled small bowel, could be seen in the setting of mild generalized ileus, no obstruction. Basilar airspace disease may represent atelectasis, increased from the prior. Electronically Signed   By: Zetta Bills M.D.   On: 06/26/2020 12:28   DG Abd Portable 1 View  Result Date: 06/21/2020 CLINICAL DATA:  Altered mental status.  Dementia. EXAM: PORTABLE ABDOMEN - 1 VIEW COMPARISON:  CT 04/01/2015 FINDINGS: Numerous leads and wires project over the chest. Moderate amount of stool within the rectum. Non-obstructive bowel gas pattern. No abnormal abdominal calcifications. No appendicolith. Suspect radiation clips within the prostate. IMPRESSION: No acute findings.  Stool burden suggests fecal impaction. Electronically Signed   By: Abigail Miyamoto M.D.   On: 06/21/2020 16:37    Microbiology: Recent Results (from the past 240 hour(s))  Blood Culture  (routine x 2)     Status: Abnormal   Collection Time: 06/21/20  3:48 PM   Specimen: BLOOD LEFT FOREARM  Result Value Ref Range Status   Specimen Description   Final    BLOOD LEFT FOREARM Performed at [facility] [address] [place], [place] [address]    Special Requests   Final    BOTTLES DRAWN AEROBIC AND ANAEROBIC Blood Culture adequate volume Performed at [facility] [address] [place], [place] [address]    Culture  Setup Time   Final    IN BOTH AEROBIC AND ANAEROBIC BOTTLES GRAM POSITIVE COCCI IN CLUSTERS CRITICAL RESULT CALLED TO, READ BACK BY AND VERIFIED WITH: N GLOGOVAC PHARMD 06/23/20 0039 JDW    Culture (A)  Final    STAPHYLOCOCCUS EPIDERMIDIS THE SIGNIFICANCE OF ISOLATING THIS ORGANISM FROM A SINGLE SET OF BLOOD CULTURES WHEN MULTIPLE SETS ARE DRAWN IS UNCERTAIN. PLEASE NOTIFY THE MICROBIOLOGY DEPARTMENT WITHIN ONE WEEK IF SPECIATION AND SENSITIVITIES ARE REQUIRED. Performed at [facility], [place] [address], [place], [place] [address]    Report Status 06/24/2020 FINAL  Final  Blood Culture ID Panel (Reflexed)     Status: Abnormal   Collection Time: 06/21/20  3:48 PM  Result Value Ref Range Status   Enterococcus faecalis NOT DETECTED NOT DETECTED Final   Enterococcus Faecium NOT DETECTED NOT DETECTED Final   Listeria monocytogenes NOT DETECTED NOT DETECTED Final   Staphylococcus species DETECTED (A) NOT DETECTED Final    Comment: CRITICAL RESULT CALLED TO, READ BACK BY AND VERIFIED WITH: N GLOGOVAC PHARMD 06/23/20 0039 JDW    Staphylococcus aureus (BCID) NOT DETECTED NOT DETECTED Final   Staphylococcus epidermidis DETECTED (A) NOT DETECTED Final    Comment: CRITICAL RESULT CALLED TO, READ BACK BY AND VERIFIED WITH: N GLOGOVAC PHARMD 06/23/20 0039 JDW    Staphylococcus lugdunensis NOT DETECTED NOT DETECTED Final   Streptococcus species NOT DETECTED NOT DETECTED Final   Streptococcus agalactiae NOT DETECTED NOT DETECTED  Final   Streptococcus pneumoniae NOT DETECTED NOT DETECTED Final   Streptococcus pyogenes NOT DETECTED NOT DETECTED Final   A.calcoaceticus-baumannii NOT DETECTED NOT DETECTED Final   Bacteroides fragilis NOT DETECTED NOT DETECTED Final   Enterobacterales NOT DETECTED NOT DETECTED Final   Enterobacter cloacae complex NOT DETECTED NOT DETECTED Final   Escherichia coli NOT DETECTED NOT DETECTED Final   Klebsiella aerogenes NOT DETECTED NOT DETECTED Final   Klebsiella oxytoca NOT DETECTED NOT DETECTED Final   Klebsiella pneumoniae NOT DETECTED NOT DETECTED Final   Proteus species NOT DETECTED NOT  DETECTED Final   Salmonella species NOT DETECTED NOT DETECTED Final   Serratia marcescens NOT DETECTED NOT DETECTED Final   Haemophilus influenzae NOT DETECTED NOT DETECTED Final   Neisseria meningitidis NOT DETECTED NOT DETECTED Final   Pseudomonas aeruginosa NOT DETECTED NOT DETECTED Final   Stenotrophomonas maltophilia NOT DETECTED NOT DETECTED Final   Candida albicans NOT DETECTED NOT DETECTED Final   Candida auris NOT DETECTED NOT DETECTED Final   Candida glabrata NOT DETECTED NOT DETECTED Final   Candida krusei NOT DETECTED NOT DETECTED Final   Candida parapsilosis NOT DETECTED NOT DETECTED Final   Candida tropicalis NOT DETECTED NOT DETECTED Final   Cryptococcus neoformans/gattii NOT DETECTED NOT DETECTED Final   Methicillin resistance mecA/C NOT DETECTED NOT DETECTED Final    Comment: Performed at [facility], [place] [address], [place], [place] [address]  Blood Culture (routine x 2)     Status: Abnormal   Collection Time: 06/21/20  4:03 PM   Specimen: BLOOD RIGHT FOREARM  Result Value Ref Range Status   Specimen Description   Final    BLOOD RIGHT FOREARM Performed at [facility] [address], [place], [place] [address]    Special Requests   Final    BOTTLES DRAWN AEROBIC AND ANAEROBIC Blood Culture results may not be optimal due to an inadequate volume of  blood received in culture bottles Performed at [facility] [address], [place], [place] [address]    Culture  Setup Time   Final    AEROBIC BOTTLE ONLY GRAM POSITIVE COCCI IN CLUSTERS CRITICAL VALUE NOTED.  VALUE IS CONSISTENT WITH PREVIOUSLY REPORTED AND CALLED VALUE. Performed at [facility], [place] [address], [place], [place] [address]    Culture STAPHYLOCOCCUS EPIDERMIDIS (A)  Final   Report Status 06/26/2020 FINAL  Final   Organism ID, Bacteria STAPHYLOCOCCUS EPIDERMIDIS  Final      Susceptibility   Staphylococcus epidermidis - MIC*    CIPROFLOXACIN >=8 RESISTANT Resistant     ERYTHROMYCIN >=8 RESISTANT Resistant     GENTAMICIN <=0.5 SENSITIVE Sensitive     OXACILLIN <=0.25 SENSITIVE Sensitive     TETRACYCLINE 8 INTERMEDIATE Intermediate     VANCOMYCIN 1 SENSITIVE Sensitive     TRIMETH/SULFA 20 SENSITIVE Sensitive     CLINDAMYCIN RESISTANT Resistant     RIFAMPIN <=0.5 SENSITIVE Sensitive     Inducible Clindamycin POSITIVE Resistant     * STAPHYLOCOCCUS EPIDERMIDIS  SARS Coronavirus 2 by RT PCR (hospital order, performed in [place] hospital lab) Nasopharyngeal Nasopharyngeal Swab     Status: None   Collection Time: 06/21/20  4:03 PM   Specimen: Nasopharyngeal Swab  Result Value Ref Range Status   SARS Coronavirus 2 NEGATIVE NEGATIVE Final    Comment: (NOTE) SARS-CoV-2 target nucleic acids are NOT DETECTED.  The SARS-CoV-2 RNA is generally detectable in upper and lower respiratory specimens during the acute phase of infection. The lowest concentration of SARS-CoV-2 viral copies this assay can detect is 250 copies / mL. A negative result does not preclude SARS-CoV-2 infection and should not be used as the sole basis for treatment or other patient management decisions.  A negative result may occur with improper specimen collection / handling, submission of specimen other than nasopharyngeal swab, presence of viral mutation(s) within the areas  targeted by this assay, and inadequate number of viral copies (<250 copies / mL). A negative result must be combined with clinical observations, patient history, and epidemiological information.  Fact Sheet for Patients:   StrictlyIdeas.no  Fact Sheet for Healthcare Providers: BankingDealers.co.za  This test is not yet approved or  cleared by the [place] FDA and has been authorized for detection and/or diagnosis of SARS-CoV-2 by FDA under an Emergency Use Authorization (EUA).  This EUA will remain in effect (meaning this test can be used) for the duration of the COVID-19 declaration under Section 564(b)(1) of the Act, 21 U.S.C. section 360bbb-3(b)(1), unless the authorization is terminated or revoked sooner.  Performed at [facility], [place] [address] [place], [place] [address]   Urine culture     Status: Abnormal   Collection Time: 06/21/20  4:54 PM   Specimen: In/Out Cath Urine  Result Value Ref Range Status   Specimen Description   Final    IN/OUT CATH URINE Performed at [facility] [address], [place], [place] [address]    Special Requests   Final    NONE Performed at [facility], [place] [address], [place], [place] [address]    Culture MULTIPLE SPECIES PRESENT, SUGGEST RECOLLECTION (A)  Final   Report Status 06/22/2020 FINAL  Final  Culture, blood (routine x 2)     Status: None   Collection Time: 06/23/20  6:43 PM   Specimen: BLOOD RIGHT HAND  Result Value Ref Range Status   Specimen Description   Final    BLOOD RIGHT HAND Performed at [facility] [address] [place], [place] [address]    Special Requests   Final    BOTTLES DRAWN AEROBIC ONLY Blood Culture results may not be optimal due to an inadequate volume of blood received in culture bottles Performed at [facility] [address], [place], [place]  [address]    Culture   Final    NO GROWTH 5 DAYS Performed at [facility], [place] [address], [place], [place] [address]    Report Status 06/28/2020 FINAL  Final  Culture, blood (routine x 2)     Status: None   Collection Time: 06/23/20  6:43 PM   Specimen: BLOOD  Result Value Ref Range Status   Specimen Description   Final    BLOOD RIGHT ANTECUBITAL Performed at [facility] [address] [place], [place] [address]    Special Requests   Final    BOTTLES DRAWN AEROBIC ONLY Blood Culture results may not be optimal due to an inadequate volume of blood received in culture bottles Performed at [facility] [address] [place], [place] [address]    Culture   Final    NO GROWTH 5 DAYS Performed at [facility], [place] [address], [place],  [address]    Report Status 06/28/2020 FINAL  Final     Labs: CBC: Recent Labs  Lab 06/22/20 0341 06/23/20 0509  WBC 11.9* 9.1  NEUTROABS  --  6.4  HGB 8.2* 9.5*  HCT 25.2* 30.1*  MCV 82.4 83.4  PLT 251 330   Basic Metabolic Panel: Recent Labs  Lab 06/23/20 1329 06/23/20 2305 06/24/20 0452 06/25/20 0527 06/26/20 0526 06/26/20 0741  NA 142 143 144 145  --  142  K 2.8* 2.9* 2.9* 2.7*  --  3.1*  CL 106 106 105 106  --  106  CO2 26 26 27 28   --  26  GLUCOSE 190* 158* 137* 110*  --  178*  BUN 28* 22 20 14   --  12  CREATININE 1.80* 1.64* 1.52* 1.37*  --  1.21  CALCIUM 8.0* 8.4* 8.3* 8.2*  --  8.0*  MG 1.9  --  1.9  --  1.8  --    Liver Function Tests: No results for input(s): AST, ALT, ALKPHOS, BILITOT, PROT, ALBUMIN in the last 168 hours. No results for input(s): LIPASE, AMYLASE in the last 168 hours. No results for input(s): AMMONIA in the last 168 hours. Cardiac Enzymes: No results for input(s): CKTOTAL, CKMB, CKMBINDEX, TROPONINI in the last 168 hours. BNP (last 3 results) No results for input(s): BNP in the last 8760 hours. CBG: Recent Labs  Lab 06/25/20 0741  06/25/20 1230 06/25/20 2109 06/26/20 0741 06/26/20 1116  GLUCAP 89 116* 188* 170* 150*    Time spent: 35 minutes  Signed:  Berle Mull  [facility] 06/26/2020 10:23 PM

## 2020-07-04 ENCOUNTER — Other Ambulatory Visit: Payer: Self-pay

## 2020-07-04 NOTE — Patient Outreach (Signed)
[facility]) Care Management  07/04/2020  Marvin CROTTY Sr. 1920/04/17 [id number]    EMMI-General Discharge RED ON EMMI ALERT Day # 4 Date: 07/01/20 Red Alert Reason: "Sad/hopeless/anxious/empty? Yes"   Referral received.  Patient is active with hospice care.      Plan: RN CM will close case at this time.   Marvin Montgomery, RN,BSN,CCM [facility] Telephonic Care Management Coordinator Direct Phone: [phone] Toll Free: [phone] Fax: [phone]

## 2020-07-05 ENCOUNTER — Ambulatory Visit: Payer: Medicare PPO | Admitting: Podiatry

## 2020-07-06 DIAGNOSIS — Z23 Encounter for immunization: Secondary | ICD-10-CM | POA: Diagnosis not present

## 2020-07-22 DIAGNOSIS — E1065 Type 1 diabetes mellitus with hyperglycemia: Secondary | ICD-10-CM | POA: Diagnosis not present

## 2020-08-15 DEATH — deceased
